# Patient Record
Sex: Male | Born: 1965
Health system: Southern US, Community
[De-identification: ages and names within clinical notes are randomized; demographics above are authoritative.]

## PROBLEM LIST (undated history)

## (undated) DIAGNOSIS — M1990 Unspecified osteoarthritis, unspecified site: Secondary | ICD-10-CM

## (undated) DIAGNOSIS — K219 Gastro-esophageal reflux disease without esophagitis: Secondary | ICD-10-CM

## (undated) DIAGNOSIS — I4891 Unspecified atrial fibrillation: Secondary | ICD-10-CM

## (undated) DIAGNOSIS — E119 Type 2 diabetes mellitus without complications: Secondary | ICD-10-CM

## (undated) DIAGNOSIS — G4733 Obstructive sleep apnea (adult) (pediatric): Secondary | ICD-10-CM

## (undated) DIAGNOSIS — D509 Iron deficiency anemia, unspecified: Secondary | ICD-10-CM

## (undated) DIAGNOSIS — J189 Pneumonia, unspecified organism: Secondary | ICD-10-CM

## (undated) DIAGNOSIS — R011 Cardiac murmur, unspecified: Secondary | ICD-10-CM

## (undated) HISTORY — DX: Obstructive sleep apnea (adult) (pediatric): G47.33

## (undated) HISTORY — DX: Cardiac murmur, unspecified: R01.1

## (undated) HISTORY — DX: Iron deficiency anemia, unspecified: D50.9

## (undated) HISTORY — DX: Unspecified osteoarthritis, unspecified site: M19.90

## (undated) HISTORY — PX: ANTERIOR FUSION CERVICAL SPINE: SUR626

## (undated) HISTORY — PX: TONSILLECTOMY: SUR1361

---

## 1998-10-31 ENCOUNTER — Emergency Department (HOSPITAL_COMMUNITY): Admission: EM | Admit: 1998-10-31 | Discharge: 1998-10-31 | Payer: Self-pay | Admitting: *Deleted

## 2001-07-28 ENCOUNTER — Emergency Department (HOSPITAL_COMMUNITY): Admission: EM | Admit: 2001-07-28 | Discharge: 2001-07-28 | Payer: Self-pay | Admitting: Emergency Medicine

## 2001-07-29 ENCOUNTER — Encounter: Payer: Self-pay | Admitting: *Deleted

## 2001-07-29 ENCOUNTER — Ambulatory Visit (HOSPITAL_COMMUNITY): Admission: RE | Admit: 2001-07-29 | Discharge: 2001-07-29 | Payer: Self-pay | Admitting: *Deleted

## 2001-08-22 ENCOUNTER — Encounter: Payer: Self-pay | Admitting: Orthopaedic Surgery

## 2001-08-22 ENCOUNTER — Encounter: Admission: RE | Admit: 2001-08-22 | Discharge: 2001-08-22 | Payer: Self-pay | Admitting: Orthopaedic Surgery

## 2001-08-23 ENCOUNTER — Encounter: Payer: Self-pay | Admitting: Orthopaedic Surgery

## 2001-08-28 ENCOUNTER — Inpatient Hospital Stay (HOSPITAL_COMMUNITY): Admission: RE | Admit: 2001-08-28 | Discharge: 2001-08-30 | Payer: Self-pay | Admitting: Orthopaedic Surgery

## 2001-08-28 ENCOUNTER — Encounter: Payer: Self-pay | Admitting: Orthopaedic Surgery

## 2001-08-29 ENCOUNTER — Encounter: Payer: Self-pay | Admitting: Orthopaedic Surgery

## 2004-11-19 ENCOUNTER — Emergency Department (HOSPITAL_COMMUNITY): Admission: EM | Admit: 2004-11-19 | Discharge: 2004-11-19 | Payer: Self-pay | Admitting: Emergency Medicine

## 2004-12-02 ENCOUNTER — Ambulatory Visit: Payer: Self-pay | Admitting: Orthopedic Surgery

## 2005-12-26 ENCOUNTER — Emergency Department (HOSPITAL_COMMUNITY): Admission: EM | Admit: 2005-12-26 | Discharge: 2005-12-26 | Payer: Self-pay | Admitting: Emergency Medicine

## 2006-06-27 DIAGNOSIS — J189 Pneumonia, unspecified organism: Secondary | ICD-10-CM

## 2006-06-27 HISTORY — DX: Pneumonia, unspecified organism: J18.9

## 2006-10-06 ENCOUNTER — Ambulatory Visit (HOSPITAL_BASED_OUTPATIENT_CLINIC_OR_DEPARTMENT_OTHER): Admission: RE | Admit: 2006-10-06 | Discharge: 2006-10-06 | Payer: Self-pay | Admitting: Urology

## 2006-10-06 ENCOUNTER — Encounter (INDEPENDENT_AMBULATORY_CARE_PROVIDER_SITE_OTHER): Payer: Self-pay | Admitting: Urology

## 2006-10-21 ENCOUNTER — Emergency Department (HOSPITAL_COMMUNITY): Admission: EM | Admit: 2006-10-21 | Discharge: 2006-10-22 | Payer: Self-pay | Admitting: Emergency Medicine

## 2006-10-22 ENCOUNTER — Emergency Department (HOSPITAL_COMMUNITY): Admission: EM | Admit: 2006-10-22 | Discharge: 2006-10-23 | Payer: Self-pay | Admitting: Emergency Medicine

## 2010-07-03 ENCOUNTER — Emergency Department (HOSPITAL_COMMUNITY): Payer: No Typology Code available for payment source

## 2010-07-03 ENCOUNTER — Inpatient Hospital Stay (HOSPITAL_COMMUNITY)
Admission: EM | Admit: 2010-07-03 | Discharge: 2010-07-05 | DRG: 184 | Disposition: A | Discharge status: Home / Self Care | Payer: No Typology Code available for payment source | Attending: General Surgery | Admitting: General Surgery

## 2010-07-03 DIAGNOSIS — Y92410 Unspecified street and highway as the place of occurrence of the external cause: Secondary | ICD-10-CM

## 2010-07-03 DIAGNOSIS — IMO0002 Reserved for concepts with insufficient information to code with codable children: Secondary | ICD-10-CM

## 2010-07-03 DIAGNOSIS — N400 Enlarged prostate without lower urinary tract symptoms: Secondary | ICD-10-CM | POA: Diagnosis present

## 2010-07-03 DIAGNOSIS — Z23 Encounter for immunization: Secondary | ICD-10-CM

## 2010-07-03 DIAGNOSIS — S2249XA Multiple fractures of ribs, unspecified side, initial encounter for closed fracture: Principal | ICD-10-CM | POA: Diagnosis present

## 2010-07-03 DIAGNOSIS — S300XXA Contusion of lower back and pelvis, initial encounter: Secondary | ICD-10-CM | POA: Diagnosis present

## 2010-07-03 DIAGNOSIS — Z981 Arthrodesis status: Secondary | ICD-10-CM

## 2010-07-03 DIAGNOSIS — S270XXA Traumatic pneumothorax, initial encounter: Secondary | ICD-10-CM | POA: Diagnosis present

## 2010-07-03 LAB — COMPREHENSIVE METABOLIC PANEL
ALT: 61 U/L — ABNORMAL HIGH (ref 0–53)
AST: 60 U/L — ABNORMAL HIGH (ref 0–37)
Albumin: 4 g/dL (ref 3.5–5.2)
Calcium: 9.4 mg/dL (ref 8.4–10.5)
GFR calc Af Amer: >60 mL/min (ref >60)
Sodium: 139 mEq/L (ref 135–145)
Total Protein: 7.9 g/dL (ref 6.0–8.3)

## 2010-07-03 LAB — TYPE AND SCREEN
ABO/RH(D): A POS
Antibody Screen: NEGATIVE

## 2010-07-03 LAB — POCT I-STAT, CHEM 8
BUN: 12 mg/dL (ref 6–23)
Potassium: 4.2 mEq/L (ref 3.5–5.1)
Sodium: 138 mEq/L (ref 135–145)
TCO2: 24 mmol/L (ref 0–100)

## 2010-07-03 LAB — DIFFERENTIAL
Basophils Absolute: 0 10*3/uL (ref 0.0–0.1)
Basophils Relative: 0 % (ref 0–1)
Lymphocytes Relative: 28 % (ref 12–46)
Monocytes Relative: 6 % (ref 3–12)
Neutro Abs: 6.2 10*3/uL (ref 1.7–7.7)
Neutrophils Relative %: 63 % (ref 43–77)

## 2010-07-03 LAB — CBC
Hemoglobin: 15.6 g/dL (ref 13.0–17.0)
MCH: 33.5 pg (ref 26.0–34.0)
RBC: 4.65 MIL/uL (ref 4.22–5.81)

## 2010-07-03 MED ORDER — IOHEXOL 300 MG/ML  SOLN
120.0000 mL | Freq: Once | INTRAMUSCULAR | Status: AC | PRN
  Administered 2010-07-03: 120 mL via INTRAVENOUS

## 2010-07-04 ENCOUNTER — Inpatient Hospital Stay (HOSPITAL_COMMUNITY): Payer: No Typology Code available for payment source

## 2010-07-04 LAB — BASIC METABOLIC PANEL
CO2: 26 mEq/L (ref 19–32)
Calcium: 8.9 mg/dL (ref 8.4–10.5)
Creatinine, Ser: 1.13 mg/dL (ref 0.4–1.5)
GFR calc Af Amer: >60 mL/min (ref >60)
GFR calc non Af Amer: >60 mL/min (ref >60)

## 2010-07-04 LAB — CBC
HCT: 40.9 % (ref 39.0–52.0)
Hemoglobin: 13.7 g/dL (ref 13.0–17.0)
MCH: 32.8 pg (ref 26.0–34.0)
MCV: 97.8 fL (ref 78.0–100.0)
RBC: 4.18 MIL/uL — ABNORMAL LOW (ref 4.22–5.81)

## 2010-07-05 ENCOUNTER — Inpatient Hospital Stay (HOSPITAL_COMMUNITY): Payer: No Typology Code available for payment source
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
## 2010-09-28 NOTE — Op Note (Signed)
NAME:  Stephen Roberts, Stephen Roberts              ACCOUNT NO.:  [id number]   MEDICAL RECORD NO.:  [id number]          PATIENT TYPE:  AMB   LOCATION:  NESC                         FACILITY:  [facility]   PHYSICIAN:  Sigmund I. Patsi Sears, M.D.DATE OF BIRTH:  18-May-1965   DATE OF PROCEDURE:  10/06/2006  DATE OF DISCHARGE:                               OPERATIVE REPORT   PREOPERATIVE DIAGNOSIS:  Chronic nonbacterial prostatitis and  interstitial cystitis.   POSTOPERATIVE DIAGNOSIS:  Chronic nonbacterial prostatitis and  interstitial cystitis.   OPERATION:  Cystourethroscopy, hydrodistention of bladder (900 mL),  biopsy of the bladder, cauterization of the biopsy sites, and insertion  of Pyridium and Marcaine into the bladder.   SURGEON:  Sigmund I. Patsi Sears, M.D.   ANESTHESIA:  General LMA.   PREPARATION:  After appropriate preanesthesia, the patient was brought  to the operating room and placed upon the operating table in the dorsal  supine position where general LMA anesthesia was introduced.  He was  then replaced in the dorsal lithotomy position where the pubis was  prepped with Betadine solution and draped in the usual fashion.   PROCEDURE:  Cystourethroscopy was accomplished and showed no evidence of  urethral stricture.  The prostate was 2+ and lobular and benign.  The  bladder neck was normal.  The bladder itself appeared normal, except for  a nodule located just to the left of the midline of the trigone.  Hydrodistention of the bladder was accomplished in 900 mL without  cracking of the bladder.  Photo documentation was accomplished.   Biopsy was taken of the nodular abnormality of the bladder, and the site  was cauterized.  Marcaine and Pyridium solution was inserted in the  bladder and left in the bladder.  The patient was given IV Toradol,  awakened, and taken to the recovery room in good condition.      Sigmund I. Patsi Sears, M.D.  Electronically Signed     SIT/MEDQ  D:   10/06/2006  T:  10/06/2006  Job:  [id number]

## 2010-10-01 NOTE — H&P (Signed)
[place]  Patient:    Stephen Roberts, Stephen Roberts Visit Number: [id number] MRN: [id number]          Service Type: SUR Location: 3000 3020 01 Attending Physician:  Patricia Nettle Dictated by:   Sammuel Cooper Mahar, P.A. Admit Date:  08/28/2001 Discharge Date: 08/30/2001                           History and Physical  DATE OF BIRTH:  1965-11-16  CHIEF COMPLAINT:  Neck and left upper extremity pain.  HISTORY OF PRESENT ILLNESS:  The patient is a 45 year old white male with a 5-6 week history of neck and arm pain.  He has no known injury.  It has been continuous and even getting worse.  It is interfering with his activities of daily living and quality of life.  He has failed conservative management.  The risks and benefits of the proposed surgery were discussed with the patient as well as the patients family by Dr. Sharolyn Douglas, as well as myself.  They indicated understanding and wished to proceed.  ALLERGIES:  No known drug allergies.  MEDICATIONS:  Percocet p.r.n. pain.  PAST MEDICAL HISTORY:  Healthy.  PAST SURGICAL HISTORY:  None.  SOCIAL HISTORY:  The patient smokes two packs of cigarettes per day.  Rare alcohol use.  He is married and has one child living at home.  He does have a history of drug abuse; however, he is recovered from this.  His wife will be available to help him through the postoperative course.  She is in excellent health.  FAMILY HISTORY:  Unknown.  REVIEW OF SYSTEMS:  The patient denies any fevers, chills, sweats, or bleeding tendencies.  CNS:  Denies blurred vision, double vision, seizure, headache, paralysis.  CARDIOVASCULAR:  No chest pain, angina, orthopnea, claudication, or palpitations.  PULMONARY:  Denies shortness of breath, productive cough, or hemoptysis.  GASTROINTESTINAL:  Denies nausea, vomiting, diarrhea, melena, or bloody stool.  Positive for constipation secondary to Percocet.  It is relieved with over the  counter medications.  GENITOURINARY:  Denies dysuria, hematuria, or discharge.  MUSCULOSKELETAL:  Denies any paresthesias or numbness.  Does have radiating pain into the left upper extremity.  No paralysis.  PHYSICAL EXAMINATION:  VITAL SIGNS:  Blood pressure is 128/84, respirations 16 and unlabored, pulse is 80 and regular, temperature not taken.  GENERAL:  The patient is a 45 year old white male who is alert and oriented in no acute distress, well-nourished, well-groomed.  He appears his stated age. He is very pleasant and cooperative to examination.  He presents with his wife and daughter.  HEENT:  Head is normocephalic, atraumatic.  Pupils equal, round, and reactive to light.  Extraocular movements intact.  Nares are patent.  Pharynx is clear.  NECK:  Soft to palpation.  No lymphadenopathy, thyromegaly, or bruits appreciated.  He has obvious posterior neck discomfort as well as guarding.  CHEST:  Positive wheezes throughout lung fields.  No rales, rhonchi, stridor, or rubs.  BREASTS:  Not pertinent, not performed.  HEART:  S1, S2, regular rate and rhythm, no murmurs, gallops, or rubs noted.  ABDOMEN:  Soft to palpation, nontender, nondistended, no organomegaly noted. Positive bowel sounds throughout.  GENITOURINARY:  Not pertinent, not performed.  EXTREMITIES:  Left upper extremity with radiating pain.  Radial pulses are intact and equal bilaterally.  NEUROLOGIC:  Distal motor function is intact.  Sensation is grossly intact to  light touch.  SKIN:  Intact without any lesions or rashes.  IMPRESSION:  Herniated nucleus pulposus at C5-6 and 6-7.  PLAN:  Admit to [facility] on August 28, 2001, for an anterior cervical diskectomy and fusion at levels C5-6 and 6-7.  It will be done by Dr. Sharolyn Douglas. Dictated by:   Sammuel Cooper. Mahar, P.A. Attending Physician:  Patricia Nettle. DD:  08/23/01 TD:  08/23/01 Job: [id number]

## 2010-10-01 NOTE — Op Note (Signed)
[place]. [facility]  Patient:    Stephen Roberts, Stephen Roberts Visit Number: [id number] MRN: [id number]          Service Type: SUR Location: RCRM 2550 11 Attending Physician:  Patricia Nettle Dictated by:   Patricia Nettle, M.D. Proc. Date: 08/28/01 Admit Date:  08/28/2001                             Operative Report  PREOPERATIVE DIAGNOSIS:  Herniated disk C5-6, C6-7 with left upper extremity radiculopathy.  POSTOPERATIVE DIAGNOSIS:  Herniated disk C5-6, C6-7 with left upper extremity radiculopathy.  OPERATION: 1. Anterior cervical diskectomy C5-6 and C6-7. 2. Anterior cervical fusion C5-6, C6-7. 3. Placement of Synthes 10 mm lordotic prosthesis spacer at C5-6 and    C6-7. 4. Left morcellized anterior iliac crest bone graft. 5. Anterior cervical plating with Synthes small stature plate, Y7-W2. 6. Placement and removal of Garner-Wells tongs. 7. Dural monitoring with SSEPs.  SURGEON:  Patricia Nettle, M.D.  ASSISTANT:  Kerrin Champagne, M.D.  ANESTHESIA:  General endotracheal.  COMPLICATIONS:  None.  INDICATIONS:  The patient is a 45 year old male with a history of severe incapacitating left upper extremity pain.  He was initially seen by Dr. Darrelyn Hillock who suspected a cervical radiculopathy and ordered an MRI scan. The patient was in such pain that he had to undergo general anesthesia in order to have the MRI done.  His MRI scan showed two large disk herniations at C5-6 and C6-7 on the left consistent with his symptomatology.  A CT myelogram was ordered again demonstrating the above mentioned pathology.  He was sent for a selective nerve root injection left C5-6 and C6-7 and if anything his symptoms worsened.  He tried gentle cervical traction as well as narcotic pain medication and felt as though he was getting weaker.  Neurologic exam shows weakness in the biceps, triceps and wrist extensors, 4/5.  After a long discussion with the patient regarding the risks and  benefits of anterior cervical diskectomy and fusion, left anterior iliac crest bone graft, he elected to proceed in hopes of improving his pain.  All of the risks of surgery were discussed preoperatively including bleeding, infection, nerve root and spinal cord injury, dural tear, CSF leak, paralysis, pseudoarthrosis, hardware failure and the need for additional surgery.  We also discussed approach related issues including injury to the esophagus, trachea and carotid artery and laryngeal nerve.  We discussed the postoperative course including bracing, return to work and normal activities.  DESCRIPTION OF PROCEDURE:  The patient was properly identified in the holding area and taken to the operating room.  He underwent general endotracheal anesthesia without difficulty.  Care was taken not to hyperextend his neck. At this point we established some minor sensory evoked potentials and took baseline studies.  We then carefully positioned him in slight hyperextension. The Garner-Wells tongs were placed in the usual standard fashion and 5 pounds of weight were then hung off of his head.  He was placed in reverse Trendelenburg.  The neck and left iliac crest were prepped and draped in the usual sterile fashion.  He was given prophylactic antibiotics.  A 4 cm incision was made just above the cricoid cartilage starting in the midline and extending to the midportion of the sternocleidomastoid.  This was carried down to the platsyma.  The platysma was incised sharply.  The sternocleidomastoid muscles was then skeletonized and the plane dissected between  the SCM and the strap muscles medially.  The bones of the anterior cervical spine were identified.  The esophagus was identified and protected at all times.  We then placed a spinal needle and took an x-ray confirming that we were at the C6-7 level.  While the x-ray was being developed I harvested the left anterior iliac crest bone graft.  A 1 cm  incision was made 2 cm posterior to the anterior superior iliac spine.  This was carried down to deep fascia.  The deep fascia was incised, and the iliac crest was exposed.  She was used a Teacher, early years/pre 10 mm bone harvester to collect 3 cc of cancellous bone.  The wound was copiously irrigated.  The deep layer was closed with #1 Vicryl followed by 3-0 Vicryl on the skin and then a running Monocryl.  Steri-Strips were placed. Attention was directed back to the neck.  We had identified the C6-C7 level and then we worked up from there.  The longus coli muscle was reflected bilaterally using the electrocautery.  Care was taken to protect the disk below and above.  We then placed our deep Shadowline retractor.  A subtotal diskectomy was then performed using pituitary rongeurs and curets.  We then placed the [facility] distractor at C6-7 and brought in the microscope to complete the procedure.  The posterior longitudinal ligament was identified.  We used the bur to remove the osteophytes.  There was a large subligamentous herniation on the left side which extended out into the foramen.  This was removed in one large piece after we took down the PLL in the foramen.  We identified the nerve root and confirmed that it was free and patent out the foramen using a blunt probe.  We did not take down the PLL on the right side. At this point, we prepared our end-plates using the high speed bur.  Then using the Synthes trial spacers, a 10 mm lordotic graft was chosen.  This was packed with the morcellized bone graft that had been collected from the iliac crest.  We then tamped the prosthesis into the intervertebral space.  He had excellent bony contact.  At this point, we carried out a similar procedure at C5-6.  Again, a herniation was identified subligamentous on the left not quite as large as the one we had removed from the C6-7 foramen.  We again used the Synthes trial spacers and placed the 10 mm graft at this  level.  The wound was copiously irrigated.  Penfield drain was placed deep.  We took and x-ray confirming the appropriate level and good position of the hardware.  We closed  the platsyma with interrupted 2-0 Vicryl.  The subcutaneous layer was closed with 3-0 Vicryl followed by a running Monocryl and Steri-Strips.  Towards the end of the procedure the SSEPs decreased approximately 50% on the right side. We could not identify any intraoperative cause of this and closed the wound. When the patient was extubated he had excellent strength in all four extremities.  A sterile dressing was applied to the wounds.  Garner-Wells tongs were removed.  An Aspen collar was placed.  He was transferred to the recovery room in stable condition. Dictated by:   Patricia Nettle, M.D. Attending Physician:  Patricia Nettle. DD:  08/28/01 TD:  08/28/01 Job: [id number]

## 2011-03-03 LAB — URINALYSIS, ROUTINE W REFLEX MICROSCOPIC
Nitrite: POSITIVE — AB
Nitrite: NEGATIVE
Protein, ur: >300 — AB
Protein, ur: 100 — AB
Urobilinogen, UA: 4 — ABNORMAL HIGH
pH: 6

## 2011-03-03 LAB — URINE CULTURE: Colony Count: NO GROWTH

## 2011-03-03 LAB — URINE MICROSCOPIC-ADD ON

## 2011-05-05 ENCOUNTER — Other Ambulatory Visit: Payer: Self-pay | Admitting: Orthopaedic Surgery

## 2011-05-05 DIAGNOSIS — M545 Low back pain: Secondary | ICD-10-CM

## 2011-05-05 DIAGNOSIS — M542 Cervicalgia: Secondary | ICD-10-CM

## 2012-03-22 ENCOUNTER — Other Ambulatory Visit: Payer: Self-pay | Admitting: Family Medicine

## 2012-03-22 ENCOUNTER — Ambulatory Visit
Admission: RE | Admit: 2012-03-22 | Discharge: 2012-03-22 | Disposition: A | Discharge status: Home / Self Care | Payer: Worker's Compensation | Source: Ambulatory Visit | Attending: Family Medicine | Admitting: Family Medicine

## 2012-03-22 DIAGNOSIS — B999 Unspecified infectious disease: Secondary | ICD-10-CM

## 2013-01-11 ENCOUNTER — Ambulatory Visit (INDEPENDENT_AMBULATORY_CARE_PROVIDER_SITE_OTHER): Payer: Self-pay | Admitting: General Surgery

## 2013-01-11 ENCOUNTER — Encounter (INDEPENDENT_AMBULATORY_CARE_PROVIDER_SITE_OTHER): Payer: Self-pay | Admitting: General Surgery

## 2013-01-11 ENCOUNTER — Ambulatory Visit (INDEPENDENT_AMBULATORY_CARE_PROVIDER_SITE_OTHER): Payer: Worker's Compensation | Admitting: General Surgery

## 2013-01-11 VITALS — BP 124/72 | HR 64 | Resp 16 | Ht 69.0 in | Wt 224.2 lb

## 2013-01-11 DIAGNOSIS — K436 Other and unspecified ventral hernia with obstruction, without gangrene: Secondary | ICD-10-CM

## 2013-01-11 DIAGNOSIS — E669 Obesity, unspecified: Secondary | ICD-10-CM | POA: Insufficient documentation

## 2013-01-11 DIAGNOSIS — Z720 Tobacco use: Secondary | ICD-10-CM | POA: Insufficient documentation

## 2013-01-11 DIAGNOSIS — F17200 Nicotine dependence, unspecified, uncomplicated: Secondary | ICD-10-CM | POA: Insufficient documentation

## 2013-01-11 DIAGNOSIS — K420 Umbilical hernia with obstruction, without gangrene: Secondary | ICD-10-CM

## 2013-01-11 NOTE — Progress Notes (Signed)
Patient ID: Stephen Roberts, male   DOB: [birth year], 47 y.o.   MRN: [id number]  Chief Complaint  Patient presents with  . New Evaluation    eval abd mass/ abd hernia    HPI Stephen Roberts is a 47 y.o. male.  He is referred by Dr. Milinda Antis at [facility] family [facility] for evaluation of painful abdominal wall hernias.  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 He lifts heavy objects all day long. He has no prior history of hernia. About one month ago while lifting a steel object, he felt some pain just above his umbilicus in the midline. He has had pain ever since. He says he has a bulge above the umbilicus that can be almost as large as orange and will sometimes go down it might. No gastrointestinal symptoms. He had a CT scan at [facility] on [facility] We have the disc and I have looked at the CT scan myself. It shows fascial defect at the umbilicus traversed by fat and soft tissue. No bowel herniation is noted. Nonobstructing left upper pole renal calculus. Otherwise normal study.  He is here with his wife today. He is in no distress. He says he's lost 2 or 3 days work already because it is too painful for him to do his job.  He is somewhat overweight with BMI 33. He is a smoker. No other significant medical problems. HPI  Past Medical History  Diagnosis Date  . Arthritis   . Heart murmur     Past Surgical History  Procedure Laterality Date  . Anterior fusion cervical spine      History reviewed. No pertinent family history.  Social History History  Substance Use Topics  . Smoking status: Current Every Day Smoker -- 0.50 packs/day  . Smokeless tobacco: Never Used  . Alcohol Use: Yes    No Known Allergies  Current Outpatient Prescriptions  Medication Sig Dispense Refill  . HYDROcodone-acetaminophen (NORCO/VICODIN) 5-325 MG per tablet Take 1 tablet by mouth every 6 (six) hours as needed for pain.      Marland Kitchen ibuprofen (ADVIL,MOTRIN) 600 MG tablet Take 600 mg by mouth  every 6 (six) hours as needed for pain.       No current facility-administered medications for this visit.    Review of Systems Review of Systems  Constitutional: Negative for fever, chills and unexpected weight change.  HENT: Negative for hearing loss, congestion, sore throat, trouble swallowing and voice change.   Eyes: Negative for visual disturbance.  Respiratory: Negative for cough and wheezing.   Cardiovascular: Negative for chest pain, palpitations and leg swelling.  Gastrointestinal: Positive for abdominal pain. Negative for nausea, vomiting, diarrhea, constipation, blood in stool, abdominal distention, anal bleeding and rectal pain.  Genitourinary: Negative for hematuria and difficulty urinating.  Musculoskeletal: Negative for arthralgias.  Skin: Negative for rash and wound.  Neurological: Negative for seizures, syncope, weakness and headaches.  Hematological: Negative for adenopathy. Does not bruise/bleed easily.  Psychiatric/Behavioral: Negative for confusion.    Blood pressure 124/72, pulse 64, resp. rate 16, height 5\' 9"  (1.753 m), weight 224 lb 3.2 oz (101.696 kg).  Physical Exam Physical Exam  Constitutional: He is oriented to person, place, and time. He appears well-developed and well-nourished. No distress.  HENT:  Head: Normocephalic.  Nose: Nose normal.  Mouth/Throat: No oropharyngeal exudate.  Eyes: Conjunctivae and EOM are normal. Pupils are equal, round, and reactive to light. Right eye exhibits no discharge. Left eye exhibits no discharge. No scleral  icterus.  Neck: Normal range of motion. Neck supple. No JVD present. No tracheal deviation present. No thyromegaly present.  Cardiovascular: Normal rate, regular rhythm, normal heart sounds and intact distal pulses.   No murmur heard. Pulmonary/Chest: Effort normal and breath sounds normal. No stridor. No respiratory distress. He has no wheezes. He has no rales. He exhibits no tenderness.  Abdominal: Soft. Bowel  sounds are normal. He exhibits no distension and no mass. There is no tenderness. There is no rebound and no guarding.    He has a partially incarcerated umbilical hernia with a hernia bulge presenting in the right upper rim of the umbilicus. He has a larger epigastric hernia in the midline above the umbilicus. This is only partially reducible. The skin is healthy. The rest of the abdominal exam is unremarkable.  Genitourinary:  Examined standing. There is no inguinal pain or tenderness. There is no inguinal adenopathy or hernia. Penis scrotum and testes are normal  Musculoskeletal: Normal range of motion. He exhibits no edema and no tenderness.  Lymphadenopathy:    He has no cervical adenopathy.  Neurological: He is alert and oriented to person, place, and time. He has normal reflexes. Coordination normal.  Skin: Skin is warm and dry. No rash noted. He is not diaphoretic. No erythema. No pallor.  Psychiatric: He has a normal mood and affect. His behavior is normal. Judgment and thought content normal.    Data Reviewed CT scan and report  Assessment    Incarcerated umbilical hernia and incarcerated epigastric hernia. These are tender and symptomatic and interfering with work. This is elected, because he is so symptomatic both he and I have agreed to go ahead and schedule surgery in the near future.  Tobacco abuse. I told him to cut back to 4 cigarettes a day and we will go ahead with the surgery in the near future. The told him I want him to stop smoking altogether postop  Obesity, BMI 33     Plan    We'll schedule for laparoscopic repair of his multiple ventral hernias with mesh, possible open repair in the near future  I discussed the indications, details, technique, and numerous risk of the surgery. We talked about hernia recurrence and that his recurrence rates are increased because of obesity and smoking. We talked about infection and I told him this infection risk is increased  because of smoking. Risk of bleeding. Risk of injury to adjacent organs and other unforeseen problems. He understands all these issues. All his questions are answered. He agrees with this plan.        Angelia Mould. Derrell Lolling, M.D., [facility] General and Minimally invasive Surgery Breast and Colorectal Surgery Office:   [phone] Pager:   [phone]  01/11/2013, 3:13 PM

## 2013-01-11 NOTE — Patient Instructions (Addendum)
You have 2 hernias, one at the bellybutton which is called an umbilical hernia. The second hernia, the larger and more painful hernia above that is called an epigastric hernia. Both of these are incarcerated, meaning I can only partially push the fatty tissue back in. That is why they are painful.  You will be scheduled for laparoscopic repair of your multiple ventral hernias with mesh, possible open.        Laparoscopic Ventral Hernia Repair Laparoscopic ventral hernia repairis a surgery to fix a ventral hernia. Aventral hernia, also called an incisional hernia, is a bulge of body tissue or intestines that pushes through the front part of the abdomen. This can happen if the connective tissue covering the muscles over the abdomen has a weak spot or is torn because of a surgical cut (incision) from a previous surgery. Laparoscopic ventral hernia repair is often done soon after diagnosis to stop the hernia from getting bigger, becoming uncomfortable, or becoming an emergency. This surgery usually takes about 2 hours, but the time can vary greatly. LET YOUR CAREGIVER KNOW ABOUT:  Any allergies you have.  All medicines you are taking, including steroids, vitamins, herbs, eyedrops, and over-the-counter medicines and creams.  Previous problems you or members of your family have had with the use of anesthetics.  Any blood disorders or bleeding problems you have had.  Past surgeries you have had.  Other health problems you have. RISKS AND COMPLICATIONS  Generally, laparoscopic ventral hernia repair is a safe procedure. However, as with any surgical procedure, complications can occur. Possible complications include:  Bleeding.  Trouble passing urine or having a bowel movement after the operation.  Infection.  Pneumonia.  Blood clots.  Pain in the area of the hernia.  A bulge in the area of the hernia that may be caused by a collection of fluid.  Injury to intestines or other  structures in the abdomen.  Return of the hernia after surgery. In some cases, the caregiver may need to stop the laparoscopic procedure and do regular, open surgery. This may be necessary for very difficult hernias, when organs are hard to see, or when bleeding problems occur during surgery. BEFORE THE PROCEDURE   You may need to have blood tests, urine tests, a chest X-ray, or electrocardiography done before the day of the surgery.  Ask your caregiver about changing or stopping your regular medicines.  You may need to wash with a special type of germ-killing soap.  Do not eat or drink anything for at least 6 hours before the surgery.  Make plans to have someone drive you home after the procedure. PROCEDURE   Small monitors will be put on your body. They are used to check your heart, blood pressure, and oxygen level.  An intravenous (IV) access tube will be put into a vein in your hand or arm. Fluids and medicine will flow directly into your body through the IV tube.  You will be given medicine to make you sleep through the procedure (general anesthetic).  Your abdomen will be cleaned with a special soap to kill any germs on your skin.  Once you are asleep, several small incisions will be made in your abdomen.  The large space in your abdomen will be filled with air so that it expands. This gives the caregiver more room and a better view.  A thin, lighted tube with a tiny camera on the end (laparoscope) is put through a small incision in your abdomen. The camera on  the laparoscope sends a picture to a TV screen in the operating room. This gives the caregiver a good view inside the abdomen.  Hollow tubes are put through the other small incisions in your abdomen. The tools needed for the procedure are put through these tubes.  The caregiver puts the tissue or intestines that formed the hernia back in place.  A screen-like patch (mesh) is used to close the hernia. This helps make the  area stronger. Stitches, tacks, or staples are used to keep the mesh in place.  Medicine and a bandage (dressing) or skin glue will be put over the incisions. AFTER THE PROCEDURE   You will stay in a recovery area until the anesthetic wears off. Your blood pressure and pulse will be checked often.  You may be able to go home the same day or may need to stay in the hospital for 1 or 2 days after this surgery. Your caregiver will decide when you can go home.  You may feel some pain. You will likely be given medicine for pain.  You will be urged to do breathing exercises that involve taking deep breaths. This helps prevent a lung infection after a surgery.  You may have to wear compression stockings while you are in the hospital. These stockings help keep blood clots from forming in your legs. Document Released: 04/18/2012 Document Reviewed: 02/22/2012 [facility] Patient Information 2014 [place], [place].

## 2013-01-21 ENCOUNTER — Encounter (HOSPITAL_COMMUNITY): Payer: Self-pay | Admitting: Pharmacy Technician

## 2013-01-21 NOTE — H&P (Signed)
AIZIK RE[facility]   MRN:  [id number]   Description: 47 year old male  Provider: Ernestene Mention, MD  Department: [facility]        Diagnoses    Incarcerated epigastric hernia    -  Primary    552.29    Incarcerated umbilical hernia        552.1    Obesity, unspecified        278.00    Tobacco use disorder        305.1      Reason for Visit    New Evaluation    eval abd mass/ abd hernia        Current Vitals - Last Recorded    BP Pulse Resp Ht Wt BMI    124/72 64 16 5\' 9"  (1.753 m) 224 lb 3.2 oz (101.696 kg) 33.09 kg/m2       History and Physical    Ernestene Mention, MD     Status: Signed                       HPI EDRICK WHITEHORN is a 47 y.o. male.  He is referred by Dr. Milinda Antis at [facility] for evaluation of painful abdominal wall hernias.   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 He lifts heavy objects all day long. He has no prior history of hernia. About one month ago while lifting a steel object, he felt some pain just above his umbilicus in the midline. He has had pain ever since. He says he has a bulge above the umbilicus that can be almost as large as orange and will sometimes go down it might. No gastrointestinal symptoms. He had a CT scan at [facility] on [facility].. We have the disc and I have looked at the CT scan myself. It shows fascial defect at the umbilicus traversed by fat and soft tissue. No bowel herniation is noted. Nonobstructing left upper pole renal calculus. Otherwise normal study.   He is here with his wife today. He is in no distress. He says he's lost 2 or 3 days work already because it is too painful for him to do his job.   He is somewhat overweight with BMI 33. He is a smoker. No other significant medical problems.       Past Medical History   Diagnosis  Date   .  Arthritis     .  Heart murmur           Past Surgical History   Procedure  Laterality  Date   .  Anterior fusion  cervical spine            History reviewed. No pertinent family history.   Social History History   Substance Use Topics   .  Smoking status:  Current Every Day Smoker -- 0.50 packs/day   .  Smokeless tobacco:  Never Used   .  Alcohol Use:  Yes        No Known Allergies    Current Outpatient Prescriptions   Medication  Sig  Dispense  Refill   .  HYDROcodone-acetaminophen (NORCO/VICODIN) 5-325 MG per tablet  Take 1 tablet by mouth every 6 (six) hours as needed for pain.         Marland Kitchen  ibuprofen (ADVIL,MOTRIN) 600 MG tablet  Take 600 mg by mouth every 6 (six) hours as needed for pain.  Review of Systems   Constitutional: Negative for fever, chills and unexpected weight change.  HENT: Negative for hearing loss, congestion, sore throat, trouble swallowing and voice change.   Eyes: Negative for visual disturbance.  Respiratory: Negative for cough and wheezing.   Cardiovascular: Negative for chest pain, palpitations and leg swelling.  Gastrointestinal: Positive for abdominal pain. Negative for nausea, vomiting, diarrhea, constipation, blood in stool, abdominal distention, anal bleeding and rectal pain.  Genitourinary: Negative for hematuria and difficulty urinating.  Musculoskeletal: Negative for arthralgias.  Skin: Negative for rash and wound.  Neurological: Negative for seizures, syncope, weakness and headaches.  Hematological: Negative for adenopathy. Does not bruise/bleed easily.  Psychiatric/Behavioral: Negative for confusion.      Blood pressure 124/72, pulse 64, resp. rate 16, height 5\' 9"  (1.753 m), weight 224 lb 3.2 oz (101.696 kg).   Physical Exam   Constitutional: He is oriented to person, place, and time. He appears well-developed and well-nourished. No distress.  HENT:   Head: Normocephalic.   Nose: Nose normal.   Mouth/Throat: No oropharyngeal exudate.  Eyes: Conjunctivae and EOM are normal. Pupils are equal, round, and reactive to  light. Right eye exhibits no discharge. Left eye exhibits no discharge. No scleral icterus.  Neck: Normal range of motion. Neck supple. No JVD present. No tracheal deviation present. No thyromegaly present.  Cardiovascular: Normal rate, regular rhythm, normal heart sounds and intact distal pulses.    No murmur heard. Pulmonary/Chest: Effort normal and breath sounds normal. No stridor. No respiratory distress. He has no wheezes. He has no rales. He exhibits no tenderness.  Abdominal: Soft. Bowel sounds are normal. He exhibits no distension. There is no tenderness. There is no rebound and no guarding.   He has a partially incarcerated umbilical hernia with a hernia bulge presenting in the right upper rim of the umbilicus. He has a larger epigastric hernia in the midline above the umbilicus. This is only partially reducible. The skin is healthy. The rest of the abdominal exam is unremarkable.  Genitourinary:  Examined standing. There is no inguinal pain or tenderness. There is no inguinal adenopathy or hernia. Penis scrotum and testes are normal  Musculoskeletal: Normal range of motion. He exhibits no edema and no tenderness.  Lymphadenopathy:    He has no cervical adenopathy.  Neurological: He is alert and oriented to person, place, and time. He has normal reflexes. Coordination normal.  Skin: Skin is warm and dry. No rash noted. He is not diaphoretic. No erythema. No pallor.  Psychiatric: He has a normal mood and affect. His behavior is normal. Judgment and thought content normal.      Data Reviewed CT scan and report   Assessment    Incarcerated umbilical hernia and incarcerated epigastric hernia. These are tender and symptomatic and interfering with work. This is elected, because he is so symptomatic both he and I have agreed to go ahead and schedule surgery in the near future.   Tobacco abuse. I told him to cut back to 4 cigarettes a day and we will go ahead with the surgery in the  near future. The told him I want him to stop smoking altogether postop   Obesity, BMI 33      Plan    We'll schedule for laparoscopic repair of his multiple ventral hernias with mesh, possible open repair in the near future   I discussed the indications, details, technique, and numerous risk of the surgery. We talked about hernia recurrence and that his recurrence rates  are increased because of obesity and smoking. We talked about infection and I told him this infection risk is increased because of smoking. Risk of bleeding. Risk of injury to adjacent organs and other unforeseen problems. He understands all these issues. All his questions are answered. He agrees with this plan.           Angelia Mould. Derrell Lolling, M.D., [facility] General and Minimally invasive Surgery Breast and Colorectal Surgery Office:   [phone] Pager:   [phone]

## 2013-01-22 ENCOUNTER — Ambulatory Visit (HOSPITAL_COMMUNITY)
Admission: RE | Admit: 2013-01-22 | Discharge: 2013-01-22 | Disposition: A | Discharge status: Home / Self Care | Payer: Worker's Compensation | Source: Ambulatory Visit | Attending: General Surgery | Admitting: General Surgery

## 2013-01-22 ENCOUNTER — Encounter (HOSPITAL_COMMUNITY): Payer: Self-pay

## 2013-01-22 ENCOUNTER — Encounter (HOSPITAL_COMMUNITY)
Admission: RE | Admit: 2013-01-22 | Discharge: 2013-01-22 | Disposition: A | Discharge status: Home / Self Care | Payer: Worker's Compensation | Source: Ambulatory Visit | Attending: General Surgery | Admitting: General Surgery

## 2013-01-22 DIAGNOSIS — Z01810 Encounter for preprocedural cardiovascular examination: Secondary | ICD-10-CM | POA: Insufficient documentation

## 2013-01-22 DIAGNOSIS — K439 Ventral hernia without obstruction or gangrene: Secondary | ICD-10-CM | POA: Insufficient documentation

## 2013-01-22 DIAGNOSIS — Z01818 Encounter for other preprocedural examination: Secondary | ICD-10-CM | POA: Insufficient documentation

## 2013-01-22 HISTORY — DX: Pneumonia, unspecified organism: J18.9

## 2013-01-22 HISTORY — DX: Gastro-esophageal reflux disease without esophagitis: K21.9

## 2013-01-22 LAB — COMPREHENSIVE METABOLIC PANEL
Albumin: 3.7 g/dL (ref 3.5–5.2)
BUN: 12 mg/dL (ref 6–23)
Creatinine, Ser: 0.77 mg/dL (ref 0.50–1.35)
Total Bilirubin: 0.2 mg/dL — ABNORMAL LOW (ref 0.3–1.2)
Total Protein: 7.3 g/dL (ref 6.0–8.3)

## 2013-01-22 LAB — CBC WITH DIFFERENTIAL/PLATELET
Basophils Relative: 1 % (ref 0–1)
Eosinophils Absolute: 0.2 10*3/uL (ref 0.0–0.7)
HCT: 43.5 % (ref 39.0–52.0)
Hemoglobin: 14.8 g/dL (ref 13.0–17.0)
MCH: 32.8 pg (ref 26.0–34.0)
MCHC: 34 g/dL (ref 30.0–36.0)
Monocytes Absolute: 0.7 10*3/uL (ref 0.1–1.0)
Monocytes Relative: 9 % (ref 3–12)

## 2013-01-22 NOTE — Progress Notes (Signed)
01/22/13 1405  OBSTRUCTIVE SLEEP APNEA  Have you ever been diagnosed with sleep apnea through a sleep study? No  Do you snore loudly (loud enough to be heard through closed doors)?  1  Do you often feel tired, fatigued, or sleepy during the daytime? 1  Has anyone observed you stop breathing during your sleep? 1  Do you have, or are you being treated for high blood pressure? 0  BMI more than 35 kg/m2? 0  Age over 49 years old? 0  Neck circumference greater than 40 cm/18 inches? 0  Gender: 1  Obstructive Sleep Apnea Score 4  Score 4 or greater  Results sent to PCP

## 2013-01-22 NOTE — Patient Instructions (Signed)
20      Your procedure is scheduled on:  Wednesday 01/23/2013  Report to [facility] at  1020 AM.  Call this number if you have problems the night before or morning of surgery: [phone]   Remember:             IF YOU USE CPAP,BRING MASK AND TUBING AM OF SURGERY!   Do not eat food or drink liquids AFTER MIDNIGHT!  Take these medicines the morning of surgery with A SIP OF WATER: NONE   Do not bring valuables to the hospital. [facility] IS NOT RESPONSIBLE FOR ANY BELONGINGS OR VALUABLES BROUGHT TO HOSPITAL.  Marland Kitchen  Leave suitcase in the car. After surgery it may be brought to your room.  For patients admitted to the hospital, checkout time is 11:00 AM the day of              Discharge.    DO NOT WEAR JEWELRY , MAKE-UP, LOTIONS,POWDERS,PERFUMES!             WOMEN -DO NOT SHAVE LEGS OR UNDERARMS 12 HRS. BEFORE SURGERY!               MEN MAY SHAVE AS USUAL!             CONTACTS,DENTURES OR BRIDGEWORK, FALSE EYELASHES MAY NOT BE WORN INTO SURGERY!                                           Patients discharged the day of surgery will not be allowed to drive home. If going home the same day of surgery, must have someone stay with you first 24 hrs.at home and arrange for someone to drive you home from the Hospital.                         YOUR DRIVER RU:[id number]   Special Instructions:             Please read over the following fact sheets that you were given:             1. [place] PREPARING FOR SURGERY SHEET              2.INC[facility]IVE SPIROMETRY                                        [place].Shawana Knoch,RN,BSN     [phone]                FAILURE TO FOLLOW THESE INSTRUCTIONS MAY RESULT IN CANCELLATION OF YOUR SURGERY!               Patient Signature:___________________________

## 2013-01-23 ENCOUNTER — Encounter (HOSPITAL_COMMUNITY)
Admission: RE | Disposition: A | Discharge status: Home / Self Care | Payer: Self-pay | Source: Ambulatory Visit | Attending: General Surgery

## 2013-01-23 ENCOUNTER — Observation Stay (HOSPITAL_COMMUNITY)
Admission: RE | Admit: 2013-01-23 | Discharge: 2013-01-24 | Disposition: A | Discharge status: Home / Self Care | Payer: Worker's Compensation | Source: Ambulatory Visit | Attending: General Surgery | Admitting: General Surgery

## 2013-01-23 ENCOUNTER — Encounter (HOSPITAL_COMMUNITY): Payer: Self-pay | Admitting: Anesthesiology

## 2013-01-23 ENCOUNTER — Encounter (HOSPITAL_COMMUNITY): Payer: Self-pay | Admitting: *Deleted

## 2013-01-23 ENCOUNTER — Ambulatory Visit (HOSPITAL_COMMUNITY): Payer: Worker's Compensation | Admitting: Anesthesiology

## 2013-01-23 DIAGNOSIS — K436 Other and unspecified ventral hernia with obstruction, without gangrene: Principal | ICD-10-CM | POA: Insufficient documentation

## 2013-01-23 DIAGNOSIS — K420 Umbilical hernia with obstruction, without gangrene: Secondary | ICD-10-CM

## 2013-01-23 DIAGNOSIS — N200 Calculus of kidney: Secondary | ICD-10-CM | POA: Insufficient documentation

## 2013-01-23 DIAGNOSIS — Z6833 Body mass index (BMI) 33.0-33.9, adult: Secondary | ICD-10-CM | POA: Insufficient documentation

## 2013-01-23 DIAGNOSIS — K439 Ventral hernia without obstruction or gangrene: Secondary | ICD-10-CM

## 2013-01-23 DIAGNOSIS — F17200 Nicotine dependence, unspecified, uncomplicated: Secondary | ICD-10-CM | POA: Insufficient documentation

## 2013-01-23 DIAGNOSIS — E669 Obesity, unspecified: Secondary | ICD-10-CM | POA: Insufficient documentation

## 2013-01-23 HISTORY — PX: VENTRAL HERNIA REPAIR: SHX424

## 2013-01-23 HISTORY — PX: INSERTION OF MESH: SHX5868

## 2013-01-23 LAB — CBC
HCT: 44.7 % (ref 39.0–52.0)
Hemoglobin: 15.3 g/dL (ref 13.0–17.0)
MCHC: 34.2 g/dL (ref 30.0–36.0)
RBC: 4.59 MIL/uL (ref 4.22–5.81)

## 2013-01-23 LAB — CREATININE, SERUM
Creatinine, Ser: 0.9 mg/dL (ref 0.50–1.35)
GFR calc non Af Amer: >90 mL/min (ref >90)

## 2013-01-23 SURGERY — REPAIR, HERNIA, VENTRAL, LAPAROSCOPIC
Anesthesia: General | Site: Abdomen | Wound class: Clean

## 2013-01-23 MED ORDER — GLYCOPYRROLATE 0.2 MG/ML IJ SOLN
INTRAMUSCULAR | Status: DC | PRN
  Administered 2013-01-23: .8 mg via INTRAVENOUS

## 2013-01-23 MED ORDER — LACTATED RINGERS IV SOLN: INTRAVENOUS | Status: DC

## 2013-01-23 MED ORDER — ONDANSETRON HCL 4 MG/2ML IJ SOLN
INTRAMUSCULAR | Status: DC | PRN
  Administered 2013-01-23: 4 mg via INTRAVENOUS

## 2013-01-23 MED ORDER — BUPIVACAINE-EPINEPHRINE 0.5% -1:200000 IJ SOLN
INTRAMUSCULAR | Status: DC | PRN
  Administered 2013-01-23: 15 mL

## 2013-01-23 MED ORDER — ENOXAPARIN SODIUM 40 MG/0.4ML ~~LOC~~ SOLN
40.0000 mg | Freq: Once | SUBCUTANEOUS | Status: AC
  Administered 2013-01-23: 40 mg via SUBCUTANEOUS
  Filled 2013-01-23: qty 0.4

## 2013-01-23 MED ORDER — ONDANSETRON HCL 4 MG/2ML IJ SOLN: 4.0000 mg | Freq: Four times a day (QID) | INTRAMUSCULAR | Status: DC | PRN

## 2013-01-23 MED ORDER — NEOSTIGMINE METHYLSULFATE 1 MG/ML IJ SOLN
INTRAMUSCULAR | Status: DC | PRN
  Administered 2013-01-23: 5 mg via INTRAVENOUS

## 2013-01-23 MED ORDER — HYDROMORPHONE HCL PF 1 MG/ML IJ SOLN
INTRAMUSCULAR | Status: AC
  Filled 2013-01-23: qty 1

## 2013-01-23 MED ORDER — ENOXAPARIN SODIUM 40 MG/0.4ML ~~LOC~~ SOLN
40.0000 mg | SUBCUTANEOUS | Status: DC
  Administered 2013-01-24: 40 mg via SUBCUTANEOUS
  Filled 2013-01-23 (×2): qty 0.4

## 2013-01-23 MED ORDER — DEXAMETHASONE SODIUM PHOSPHATE 10 MG/ML IJ SOLN
INTRAMUSCULAR | Status: DC | PRN
  Administered 2013-01-23: 10 mg via INTRAVENOUS

## 2013-01-23 MED ORDER — BUPIVACAINE-EPINEPHRINE 0.5% -1:200000 IJ SOLN
INTRAMUSCULAR | Status: AC
  Filled 2013-01-23: qty 1

## 2013-01-23 MED ORDER — 0.9 % SODIUM CHLORIDE (POUR BTL) OPTIME
TOPICAL | Status: DC | PRN
  Administered 2013-01-23: 1000 mL

## 2013-01-23 MED ORDER — ONDANSETRON HCL 4 MG PO TABS: 4.0000 mg | ORAL_TABLET | Freq: Four times a day (QID) | ORAL | Status: DC | PRN

## 2013-01-23 MED ORDER — LACTATED RINGERS IV SOLN
INTRAVENOUS | Status: DC
  Administered 2013-01-23: 14:00:00 via INTRAVENOUS
  Administered 2013-01-23: 1000 mL via INTRAVENOUS

## 2013-01-23 MED ORDER — HYDROMORPHONE HCL PF 1 MG/ML IJ SOLN
INTRAMUSCULAR | Status: AC
Start: 2013-01-23 — End: 2013-01-23
  Filled 2013-01-23: qty 1

## 2013-01-23 MED ORDER — CEFAZOLIN SODIUM-DEXTROSE 2-3 GM-% IV SOLR
2.0000 g | Freq: Three times a day (TID) | INTRAVENOUS | Status: DC
  Administered 2013-01-23 – 2013-01-24 (×2): 2 g via INTRAVENOUS
  Filled 2013-01-23 (×4): qty 50

## 2013-01-23 MED ORDER — HYDROMORPHONE HCL PF 1 MG/ML IJ SOLN
1.0000 mg | INTRAMUSCULAR | Status: DC | PRN
  Administered 2013-01-23: 1 mg via INTRAVENOUS
  Filled 2013-01-23: qty 1

## 2013-01-23 MED ORDER — SUCCINYLCHOLINE CHLORIDE 20 MG/ML IJ SOLN
INTRAMUSCULAR | Status: DC | PRN
  Administered 2013-01-23: 100 mg via INTRAVENOUS

## 2013-01-23 MED ORDER — CEFAZOLIN SODIUM-DEXTROSE 2-3 GM-% IV SOLR
INTRAVENOUS | Status: AC
  Filled 2013-01-23: qty 50

## 2013-01-23 MED ORDER — CHLORHEXIDINE GLUCONATE 4 % EX LIQD
1.0000 "application " | Freq: Once | CUTANEOUS | Status: DC
  Filled 2013-01-23: qty 15

## 2013-01-23 MED ORDER — CISATRACURIUM BESYLATE (PF) 10 MG/5ML IV SOLN
INTRAVENOUS | Status: DC | PRN
  Administered 2013-01-23: 10 mg via INTRAVENOUS
  Administered 2013-01-23: 2 mg via INTRAVENOUS

## 2013-01-23 MED ORDER — PROPOFOL 10 MG/ML IV BOLUS
INTRAVENOUS | Status: DC | PRN
  Administered 2013-01-23: 200 mg via INTRAVENOUS

## 2013-01-23 MED ORDER — HYDROMORPHONE HCL PF 1 MG/ML IJ SOLN
INTRAMUSCULAR | Status: DC | PRN
  Administered 2013-01-23 (×2): 1 mg via INTRAVENOUS

## 2013-01-23 MED ORDER — CEFAZOLIN SODIUM-DEXTROSE 2-3 GM-% IV SOLR
2.0000 g | INTRAVENOUS | Status: AC
  Administered 2013-01-23: 2 g via INTRAVENOUS

## 2013-01-23 MED ORDER — HYDROMORPHONE HCL PF 1 MG/ML IJ SOLN
0.2500 mg | INTRAMUSCULAR | Status: DC | PRN
  Administered 2013-01-23 (×4): 0.5 mg via INTRAVENOUS

## 2013-01-23 MED ORDER — OXYCODONE-ACETAMINOPHEN 5-325 MG PO TABS
1.0000 | ORAL_TABLET | ORAL | Status: DC | PRN
  Administered 2013-01-23 – 2013-01-24 (×4): 2 via ORAL
  Filled 2013-01-23 (×4): qty 2

## 2013-01-23 MED ORDER — SUFENTANIL CITRATE 50 MCG/ML IV SOLN
INTRAVENOUS | Status: DC | PRN
  Administered 2013-01-23 (×2): 10 ug via INTRAVENOUS
  Administered 2013-01-23: 20 ug via INTRAVENOUS
  Administered 2013-01-23: 10 ug via INTRAVENOUS

## 2013-01-23 MED ORDER — LIDOCAINE HCL (CARDIAC) 20 MG/ML IV SOLN
INTRAVENOUS | Status: DC | PRN
  Administered 2013-01-23: 100 mg via INTRAVENOUS

## 2013-01-23 MED ORDER — MIDAZOLAM HCL 5 MG/5ML IJ SOLN
INTRAMUSCULAR | Status: DC | PRN
  Administered 2013-01-23: 2 mg via INTRAVENOUS

## 2013-01-23 MED ORDER — POTASSIUM CHLORIDE IN NACL 20-0.9 MEQ/L-% IV SOLN
INTRAVENOUS | Status: DC
  Administered 2013-01-23: 20:00:00 via INTRAVENOUS
  Filled 2013-01-23 (×3): qty 1000

## 2013-01-23 SURGICAL SUPPLY — 48 items
ADH SKN CLS APL DERMABOND .7 (GAUZE/BANDAGES/DRESSINGS) ×1
APL SKNCLS STERI-STRIP NONHPOA (GAUZE/BANDAGES/DRESSINGS)
APPLIER CLIP 5 13 M/L LIGAMAX5 (MISCELLANEOUS)
APR CLP MED LRG 5 ANG JAW (MISCELLANEOUS)
BAG SPEC RTRVL LRG 6X4 10 (ENDOMECHANICALS) ×1
BENZOIN TINCTURE PRP APPL 2/3 (GAUZE/BANDAGES/DRESSINGS) IMPLANT
BINDER ABD UNIV 12 45-62 (WOUND CARE) IMPLANT
BINDER ABDOMINAL 46IN 62IN (WOUND CARE) ×2
CANISTER SUCTION 2500CC (MISCELLANEOUS) ×2 IMPLANT
CLIP APPLIE 5 13 M/L LIGAMAX5 (MISCELLANEOUS) IMPLANT
CLOTH BEACON ORANGE TIMEOUT ST (SAFETY) ×2 IMPLANT
DECANTER SPIKE VIAL GLASS SM (MISCELLANEOUS) ×2 IMPLANT
DERMABOND ADVANCED (GAUZE/BANDAGES/DRESSINGS) ×1
DERMABOND ADVANCED .7 DNX12 (GAUZE/BANDAGES/DRESSINGS) IMPLANT
DEVICE SECURE STRAP 25 ABSORB (INSTRUMENTS) IMPLANT
DEVICE TROCAR PUNCTURE CLOSURE (ENDOMECHANICALS) ×2 IMPLANT
DRAPE LAPAROSCOPIC ABDOMINAL (DRAPES) ×2 IMPLANT
DRAPE UTILITY XL STRL (DRAPES) ×2 IMPLANT
DRSG PAD ABDOMINAL 8X10 ST (GAUZE/BANDAGES/DRESSINGS) IMPLANT
ELECT REM PT RETURN 9FT ADLT (ELECTROSURGICAL) ×2
ELECTRODE REM PT RTRN 9FT ADLT (ELECTROSURGICAL) ×1 IMPLANT
GLOVE EUDERMIC 7 POWDERFREE (GLOVE) ×2 IMPLANT
GOWN STRL REIN XL XLG (GOWN DISPOSABLE) ×4 IMPLANT
KIT BASIN OR (CUSTOM PROCEDURE TRAY) ×2 IMPLANT
MARKER SKIN DUAL TIP RULER LAB (MISCELLANEOUS) ×2 IMPLANT
MESH VENTRALIGHT ST 8IN CRC (Mesh General) ×1 IMPLANT
NDL SPNL 22GX3.5 QUINCKE BK (NEEDLE) ×1 IMPLANT
NEEDLE SPNL 22GX3.5 QUINCKE BK (NEEDLE) ×2 IMPLANT
NS IRRIG 1000ML POUR BTL (IV SOLUTION) ×2 IMPLANT
POUCH SPECIMEN RETRIEVAL 10MM (ENDOMECHANICALS) ×1 IMPLANT
SCALPEL HARMONIC ACE (MISCELLANEOUS) ×1 IMPLANT
SCISSORS LAP 5X35 DISP (ENDOMECHANICALS) ×2 IMPLANT
SET IRRIG TUBING LAPAROSCOPIC (IRRIGATION / IRRIGATOR) IMPLANT
SOLUTION ANTI FOG 6CC (MISCELLANEOUS) ×2 IMPLANT
STAPLER VISISTAT 35W (STAPLE) IMPLANT
STRIP CLOSURE SKIN 1/2X4 (GAUZE/BANDAGES/DRESSINGS) IMPLANT
SUT MNCRL AB 4-0 PS2 18 (SUTURE) ×2 IMPLANT
SUT NOVA 0 T19/GS 22DT (SUTURE) IMPLANT
SUT NOVA NAB DX-16 0-1 5-0 T12 (SUTURE) ×2 IMPLANT
TACKER 5MM HERNIA 3.5CML NAB (ENDOMECHANICALS) ×3 IMPLANT
TOWEL OR 17X26 10 PK STRL BLUE (TOWEL DISPOSABLE) ×2 IMPLANT
TOWEL OR NON WOVEN STRL DISP B (DISPOSABLE) ×2 IMPLANT
TRAY FOLEY CATH 14FRSI W/METER (CATHETERS) ×2 IMPLANT
TRAY LAP CHOLE (CUSTOM PROCEDURE TRAY) ×2 IMPLANT
TROCAR BLADELESS OPT 5 75 (ENDOMECHANICALS) ×2 IMPLANT
TROCAR SLEEVE XCEL 5X75 (ENDOMECHANICALS) ×4 IMPLANT
TROCAR XCEL NON-BLD 11X100MML (ENDOMECHANICALS) ×2 IMPLANT
TUBING INSUFFLATION 10FT LAP (TUBING) ×2 IMPLANT

## 2013-01-23 NOTE — Anesthesia Preprocedure Evaluation (Signed)
Anesthesia Evaluation  Patient identified by MRN, date of birth, ID band Patient awake    Reviewed: Allergy & Precautions, H&P , NPO status , Patient's Chart, lab work & pertinent test results  Airway Mallampati: II TM Distance: >3 FB Neck ROM: full    Dental no notable dental hx.    Pulmonary Current Smoker,  breath sounds clear to auscultation  Pulmonary exam normal       Cardiovascular Exercise Tolerance: Good negative cardio ROS  Rhythm:regular Rate:Normal     Neuro/Psych negative neurological ROS  negative psych ROS   GI/Hepatic negative GI ROS, Neg liver ROS,   Endo/Other  negative endocrine ROS  Renal/GU negative Renal ROS  negative genitourinary   Musculoskeletal   Abdominal   Peds  Hematology negative hematology ROS (+)   Anesthesia Other Findings   Reproductive/Obstetrics negative OB ROS                           Anesthesia Physical Anesthesia Plan  ASA: II  Anesthesia Plan: General   Post-op Pain Management:    Induction: Intravenous  Airway Management Planned: Oral ETT  Additional Equipment:   Intra-op Plan:   Post-operative Plan: Extubation in OR  Informed Consent: I have reviewed the patients History and Physical, chart, labs and discussed the procedure including the risks, benefits and alternatives for the proposed anesthesia with the patient or authorized representative who has indicated his/her understanding and acceptance.   Dental Advisory Given  Plan Discussed with: CRNA and Surgeon  Anesthesia Plan Comments:         Anesthesia Quick Evaluation  

## 2013-01-23 NOTE — Interval H&P Note (Signed)
History and Physical Interval Note:  01/23/2013 12:25 PM  Stephen Roberts  has presented today for surgery, with the diagnosis of ventral hernia  The goals and the various methods of treatment have been discussed with the patient and family. After consideration of risks, benefits and other options for treatment, the patient has consented to  Procedure(s): LAPAROSCOPIC MULTIPLE VENTRAL HERNIAS (N/A) INSERTION OF MESH (N/A) as a surgical intervention .  The patient's history has been reviewed, patient examined today, no change in status, stable for surgery.  I have reviewed the patient's chart and labs.  Questions were answered to the patient's satisfaction.     Ernestene Mention

## 2013-01-23 NOTE — Transfer of Care (Signed)
Immediate Anesthesia Transfer of Care Note  Patient: Stephen Roberts  Procedure(s) Performed: Procedure(s): LAPAROSCOPIC MULTIPLE VENTRAL HERNIAS (N/A) INSERTION OF MESH (N/A)  Patient Location: PACU  Anesthesia Type:General  Level of Consciousness: awake, alert  and oriented  Airway & Oxygen Therapy: Patient Spontanous Breathing and Patient connected to face mask oxygen  Post-op Assessment: Report given to PACU RN and Post -op Vital signs reviewed and stable  Post vital signs: Reviewed and stable  Complications: No apparent anesthesia complications

## 2013-01-23 NOTE — Anesthesia Procedure Notes (Signed)
Procedure Name: Intubation Date/Time: 01/23/2013 1:02 PM Performed by: Leroy Libman L Patient Re-evaluated:Patient Re-evaluated prior to inductionOxygen Delivery Method: Circle system utilized Preoxygenation: Pre-oxygenation with 100% oxygen Intubation Type: IV induction Ventilation: Mask ventilation without difficulty and Oral airway inserted - appropriate to patient size Grade View: Grade I Tube type: Parker flex tip Tube size: 7.5 mm Number of attempts: 1 Airway Equipment and Method: Video-laryngoscopy Placement Confirmation: ETT inserted through vocal cords under direct vision,  breath sounds checked- equal and bilateral and positive ETCO2 Secured at: 22 cm Tube secured with: Tape Dental Injury: Teeth and Oropharynx as per pre-operative assessment

## 2013-01-23 NOTE — Op Note (Signed)
Patient Name:           Stephen Roberts   Date of Surgery:        01/23/2013  Pre op Diagnosis:      Multiple ventral hernias  Post op Diagnosis:    Same  Procedure:                 Laparoscopic repair of multiple ventral hernias with 20 cm x 20 cm ventraLite ST inlay  mesh  Surgeon:                     Angelia Mould. Derrell Lolling, M.D., FACS  Assistant:                      RNFA  Operative Indications:   Stephen Roberts is a 47 y.o. male. He was referred by Dr. Milinda Antis at [facility] for evaluation of painful abdominal wall hernias.  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 He lifts heavy objects all day long. He has no prior history of hernia. About one month ago while lifting a steel object, he felt some pain just above his umbilicus in the midline. He has had pain ever since. He says he has a bulge above the umbilicus that can be almost as large as orange and will sometimes go down it might. No gastrointestinal symptoms. He had a CT scan at [facility] on [facility].. We have the disc and I have looked at the CT scan myself. It shows fascial defect at the umbilicus and above in the midline traversed by fat and soft tissue. No bowel herniation is noted. Nonobstructing left upper pole renal calculus. Otherwise normal study.   He says he's lost 2 or 3 days work already because it is too painful for him to do his job.  He is somewhat overweight with BMI 33. He is a smoker. No other significant medical problems. He is brought to the operating room electively   Operative Findings:       There was a smaller umbilical hernia with incarcerated preperitoneal fat. Above that in the midline was a epigastric hernia, probably no more than 3 cm in diameter. This also contained preperitoneal fat and falciform ligament entered this area and had to be taken down. No other abnormalities were noted of the liver, stomach, small bowel or large bowel.  Procedure in Detail:          Following the  induction of general endotracheal anesthesia the patient's abdomen was prepped and draped in a sterile fashion. Intravenous antibiotics were given. Surgical time out was performed. 0.5% Marcaine with epinephrine was used as local infiltration anesthetic. A 5 mm optical trocar was placed in the left subcostal costal region. Pneumoperitoneum was created. This was atraumatic. There was no  sign of any bleeding or bowel injury. An 11 mm trocar was placed the left flank and a 5 mm trocar placed in the left lower quadrant. Later in the case I placed  5 mm trocars in the right flank. Using the harmonic scalpel I debrided all the preperitoneal fat out of the 2 hernias. Also took the falciform ligament down extensively to prepare a muscular platform for the mesh mesh fixation. I mapped out the 2 hernias with a spinal needle and found that if I used a 20 cm circular piece of mesh I would have 5-7 cm overlap in all directions. I brought a 20 cm diameter circular ventraLite ST  mesh  to the operative field. I marked a template on the abdominal wall for 8 suture fixation sites. Using #1 Novafil I placed the sutures in the mesh leaving the tails long. I made sure that the rough side of the mesh was positioned to be up toward the abdominal wall and the smooth side of the mesh was positioned down toward the viscera. The mesh was moistened, rolled up and inserted through the trocar. The mesh was positioned within the abdominal cavity, again being careful to keep the rough side up for the abdominal wall. At each of the preplanned suture fixation sites I made a small stab wound and then using the Endo Close pulled the sutures up to the abdominal wall, being careful to take about a 1 cm bite of muscle fascia. After all 8 sutures were placed I lifted them up and found that the mesh deployed nicely. I could tie all 8 sutures down tightly and there was very little redundancy of the mesh. I then secured the mesh with a double crown  technique using a 5 mm Protacker. I inspected this from the right side and the left side multiple times and could not detect any gaps in the outer rim of the fixation. There was no bleeding. The intestine looked fine. The pneumoperitoneum was released and the trocars were removed. The stab wounds in the skin were closed with 4-0 Monocryl and Dermabond. A velcro binder was placed and the patient taken to recovery in stable condition. EBL 20 cc. Counts correct. Complications none.     Angelia Mould. Derrell Lolling, M.D., FACS General and Minimally Invasive Surgery Breast and Colorectal Surgery  01/23/2013 2:46 PM

## 2013-01-23 NOTE — Anesthesia Postprocedure Evaluation (Signed)
  Anesthesia Post-op Note  Patient: Stephen Roberts  Procedure(s) Performed: Procedure(s) (LRB): LAPAROSCOPIC MULTIPLE VENTRAL HERNIAS (N/A) INSERTION OF MESH (N/A)  Patient Location: PACU  Anesthesia Type: General  Level of Consciousness: awake and alert   Airway and Oxygen Therapy: Patient Spontanous Breathing  Post-op Pain: mild  Post-op Assessment: Post-op Vital signs reviewed, Patient's Cardiovascular Status Stable, Respiratory Function Stable, Patent Airway and No signs of Nausea or vomiting  Last Vitals:  Filed Vitals:   01/23/13 1515  BP: 156/119  Pulse: 90  Temp:   Resp: 13    Post-op Vital Signs: stable   Complications: No apparent anesthesia complications

## 2013-01-23 NOTE — Preoperative (Signed)
Beta Blockers   Reason not to administer Beta Blockers:Not Applicable 

## 2013-01-24 ENCOUNTER — Encounter (HOSPITAL_COMMUNITY): Payer: Self-pay | Admitting: General Surgery

## 2013-01-24 MED ORDER — POLYETHYLENE GLYCOL 3350 17 G PO PACK
17.0000 g | PACK | Freq: Once | ORAL | Status: AC
  Administered 2013-01-24: 17 g via ORAL
  Filled 2013-01-24: qty 1

## 2013-01-24 MED ORDER — OXYCODONE-ACETAMINOPHEN 7.5-325 MG PO TABS: 1.0000 | ORAL_TABLET | ORAL | Status: DC | PRN

## 2013-01-24 NOTE — Discharge Summary (Addendum)
Patient ID: Stephen Roberts [id number] 47 y.o. 03/16/1966  Admit date: 01/23/2013  Discharge date and time: 01/24/2013  Admitting Physician: Ernestene Mention  Discharge Physician: Ernestene Mention  Admission Diagnoses: ventral hernia  Discharge Diagnoses: Multiple ventral hernias Tobacco abuse Obesity  Operations: Procedure(s): LAPAROSCOPIC MULTIPLE VENTRAL HERNIAS INSERTION OF MESH  Admission Condition: good  Discharged Condition: good  Indication for Admission: Stephen Roberts is a 47 y.o. male. He was referred by Dr. Milinda Antis at [facility] family [facility] for evaluation of painful abdominal wall hernias.  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 He lifts heavy objects all day long. He has no prior history of hernia. About one month ago while lifting a steel object, he felt some pain just above his umbilicus in the midline. He has had pain ever since. He says he has a bulge above the umbilicus that can be almost as large as orange and will sometimes go down it might. No gastrointestinal symptoms. He had a CT scan at [facility] on [facility].. We have the disc and I have looked at the CT scan myself. It shows fascial defect at the umbilicus and also above in the midline traversed by fat and soft tissue. No bowel herniation is noted. Nonobstructing left upper pole renal calculus. Otherwise normal study. Exam confirms a medium-sized umbilical hernia and a larger midline epigastric hernia above that. He says he's lost 2 or 3 days work already because it is too painful for him to do his job.  He is somewhat overweight with BMI 33. He is a smoker. No other significant medical problems. He is brought to the hospital for elective surgery to repair his hernia   Hospital Course: On the day of admission the patient was taken to the operating room and underwent a laparoscopic repair of his multiple ventral hernias with a 20 cm diameter piece of the ventral light ST mesh. This was  placed internally as an inlay. This was uneventful. Postoperatively he did well. He was able to get up and go to the bathroom and void without difficulty. He was ambulating in the halls.He stated he wanted to go home. The morning after surgery his abdomen was soft but appropriately sore. All the wounds looked good. He was in good spirits and stable. He was given instructions in diet and activity restrictions. He was told to avoid constipation and to stay hydrated. He was given a prescription for Percocet. He will followup with me in the office in 3 weeks.  Consults: None  Significant Diagnostic Studies: none  Treatments: surgery: Laparoscopic repair of multiple ventral hernias with mesh  Disposition: Home  Patient Instructions:    Medication List         HYDROcodone-acetaminophen 5-325 MG per tablet  Commonly known as:  NORCO/VICODIN  Take 1 tablet by mouth every 6 (six) hours as needed for pain.     ibuprofen 200 MG tablet  Commonly known as:  ADVIL,MOTRIN  Take 400 mg by mouth every 6 (six) hours as needed for pain.     oxyCODONE-acetaminophen 7.5-325 MG per tablet  Commonly known as:  PERCOCET  Take 1 tablet by mouth every 4 (four) hours as needed for pain.        Activity: no heavy lifting for 4-6 weeks. No driving for one week. Unlimited ambulation. Able to climb stairs. Diet: low fat, low cholesterol diet Wound Care: none needed  Follow-up:  With Dr. Derrell Lolling in 3 weeks.  Signed: Angelia Mould. Derrell Lolling, M.D., FACS  General and minimally invasive surgery Breast and Colorectal Surgery  01/24/2013, 5:37 AM

## 2013-01-25 ENCOUNTER — Telehealth (INDEPENDENT_AMBULATORY_CARE_PROVIDER_SITE_OTHER): Payer: Self-pay | Admitting: General Surgery

## 2013-01-25 ENCOUNTER — Telehealth (INDEPENDENT_AMBULATORY_CARE_PROVIDER_SITE_OTHER): Payer: Self-pay

## 2013-01-25 ENCOUNTER — Encounter (HOSPITAL_COMMUNITY): Payer: Self-pay

## 2013-01-25 ENCOUNTER — Inpatient Hospital Stay (HOSPITAL_COMMUNITY)
Admission: EM | Admit: 2013-01-25 | Discharge: 2013-01-28 | DRG: 394 | Disposition: A | Discharge status: Home / Self Care | Payer: Worker's Compensation | Attending: General Surgery | Admitting: General Surgery

## 2013-01-25 ENCOUNTER — Emergency Department (HOSPITAL_COMMUNITY): Payer: Worker's Compensation

## 2013-01-25 DIAGNOSIS — R109 Unspecified abdominal pain: Secondary | ICD-10-CM

## 2013-01-25 DIAGNOSIS — K929 Disease of digestive system, unspecified: Principal | ICD-10-CM | POA: Diagnosis present

## 2013-01-25 DIAGNOSIS — M129 Arthropathy, unspecified: Secondary | ICD-10-CM | POA: Diagnosis present

## 2013-01-25 DIAGNOSIS — K560 Paralytic ileus: Secondary | ICD-10-CM | POA: Diagnosis present

## 2013-01-25 DIAGNOSIS — R011 Cardiac murmur, unspecified: Secondary | ICD-10-CM | POA: Diagnosis present

## 2013-01-25 DIAGNOSIS — K567 Ileus, unspecified: Secondary | ICD-10-CM

## 2013-01-25 DIAGNOSIS — E669 Obesity, unspecified: Secondary | ICD-10-CM | POA: Diagnosis present

## 2013-01-25 DIAGNOSIS — K219 Gastro-esophageal reflux disease without esophagitis: Secondary | ICD-10-CM | POA: Diagnosis present

## 2013-01-25 DIAGNOSIS — Y839 Surgical procedure, unspecified as the cause of abnormal reaction of the patient, or of later complication, without mention of misadventure at the time of the procedure: Secondary | ICD-10-CM | POA: Diagnosis present

## 2013-01-25 DIAGNOSIS — F17200 Nicotine dependence, unspecified, uncomplicated: Secondary | ICD-10-CM | POA: Diagnosis present

## 2013-01-25 DIAGNOSIS — Z6833 Body mass index (BMI) 33.0-33.9, adult: Secondary | ICD-10-CM

## 2013-01-25 DIAGNOSIS — K5900 Constipation, unspecified: Secondary | ICD-10-CM | POA: Diagnosis present

## 2013-01-25 LAB — CBC WITH DIFFERENTIAL/PLATELET
Basophils Absolute: 0 10*3/uL (ref 0.0–0.1)
Eosinophils Relative: 3 % (ref 0–5)
Lymphocytes Relative: 24 % (ref 12–46)
Lymphs Abs: 2.3 10*3/uL (ref 0.7–4.0)
MCV: 97.1 fL (ref 78.0–100.0)
Neutro Abs: 6.1 10*3/uL (ref 1.7–7.7)
Neutrophils Relative %: 65 % (ref 43–77)
Platelets: 146 10*3/uL — ABNORMAL LOW (ref 150–400)
RBC: 4.54 MIL/uL (ref 4.22–5.81)
WBC: 9.4 10*3/uL (ref 4.0–10.5)

## 2013-01-25 LAB — COMPREHENSIVE METABOLIC PANEL
ALT: 27 U/L (ref 0–53)
AST: 29 U/L (ref 0–37)
Alkaline Phosphatase: 99 U/L (ref 39–117)
CO2: 26 mEq/L (ref 19–32)
Chloride: 103 mEq/L (ref 96–112)
GFR calc Af Amer: >90 mL/min (ref >90)
GFR calc non Af Amer: >90 mL/min (ref >90)
Glucose, Bld: 112 mg/dL — ABNORMAL HIGH (ref 70–99)
Potassium: 3.9 mEq/L (ref 3.5–5.1)
Sodium: 139 mEq/L (ref 135–145)
Total Bilirubin: 0.2 mg/dL — ABNORMAL LOW (ref 0.3–1.2)

## 2013-01-25 MED ORDER — ONDANSETRON HCL 4 MG/2ML IJ SOLN
4.0000 mg | Freq: Once | INTRAMUSCULAR | Status: AC
  Administered 2013-01-25: 4 mg via INTRAVENOUS
  Filled 2013-01-25: qty 2

## 2013-01-25 MED ORDER — SODIUM CHLORIDE 0.9 % IV SOLN
1000.0000 mL | INTRAVENOUS | Status: DC
  Administered 2013-01-25: 1000 mL via INTRAVENOUS

## 2013-01-25 NOTE — Telephone Encounter (Signed)
Pt calling in b/c he is having problems with constipation. The pt has not had a BM in two days and he has been taking the Miralax/stool softners liked advised by Dr Derrell Lolling. The pt is starting to have more discomfort from not having a BM so I advised pt that he needed to be on liquid diet until he has a BM. I advised pt per protocol that the pt needed to get some Milk of Magnesia to take 4 tbls. I advised pt that if in a few hours if nothing has happened the pt might have to repeat the dose of Milk of Magnesia. The pt understands.

## 2013-01-25 NOTE — ED Notes (Signed)
No difficulty with urination, no fever, no drainage at incision.

## 2013-01-25 NOTE — ED Notes (Signed)
Per pt, had hernia surgery Wednesday.  Pt states no bm since surgery.  Pt now with abdominal pain.

## 2013-01-25 NOTE — ED Provider Notes (Signed)
CSN: [id number]     Arrival date & time 01/25/13  2109 History   First MD Initiated Contact with Patient 01/25/13 2219     Chief Complaint  Patient presents with  . Abdominal Pain  . Constipation   (Consider location/radiation/quality/duration/timing/severity/associated sxs/prior Treatment) The history is provided by the patient and medical records. No language interpreter was used.    Stephen Roberts is a 47 y.o. male  with a hx of arthritis, GERD, pneumonia presents to the Emergency Department complaining of gradual, intermittent, progressively worsening abdominal pain and constipation onset 2 days ago after having hernia surgery.  Patient was discharged home with Percocet for pain, stool softeners and recommendation to take MiraLax.  Patient reports is only taking one Percocet has had 2 doses of MiraLax, 2 doses of milk of magnesia and a stool softener all without a bowel movement.  Patient had Laparoscopic repair of multiple ventral hernias with mesh Dr. Derrell Lolling on 01/23/2013.  OP note indicates no complications at the time of surgery.  Pt has been eating and drinking normally but has increased pain with each meal.  Associated symptoms include bloating, chills, no flatulence.  Nothing makes it better and  eating makes it worse.  Pt denies fever, headache, neck pain, chest pain, SOB, vomiting, diarrhea, weakness, dizziness, syncope, dysuria, hematuria.     Past Medical History  Diagnosis Date  . Arthritis   . Heart murmur   . GERD (gastroesophageal reflux disease)   . Pneumonia 06/27/2006   Past Surgical History  Procedure Laterality Date  . Anterior fusion cervical spine    . Tonsillectomy      as child  . Ventral hernia repair N/A 01/23/2013    Procedure: LAPAROSCOPIC MULTIPLE VENTRAL HERNIAS;  Surgeon: Ernestene Mention, MD;  Location: [facility];  Service: General;  Laterality: N/A;  . Insertion of mesh N/A 01/23/2013    Procedure: INSERTION OF MESH;  Surgeon: Ernestene Mention, MD;   Location: [facility];  Service: General;  Laterality: N/A;   History reviewed. No pertinent family history. History  Substance Use Topics  . Smoking status: Current Every Day Smoker -- 0.50 packs/day for 30 years    Types: Cigarettes  . Smokeless tobacco: Never Used  . Alcohol Use: Yes     Comment: occassionally    Review of Systems  Constitutional: Negative for fever, diaphoresis, appetite change, fatigue and unexpected weight change.  HENT: Negative for mouth sores and neck stiffness.   Eyes: Negative for visual disturbance.  Respiratory: Negative for cough, chest tightness, shortness of breath and wheezing.   Cardiovascular: Negative for chest pain.  Gastrointestinal: Positive for abdominal pain and abdominal distention. Negative for nausea, vomiting, diarrhea and constipation.  Endocrine: Negative for polydipsia, polyphagia and polyuria.  Genitourinary: Negative for dysuria, urgency, frequency and hematuria.  Musculoskeletal: Negative for back pain.  Skin: Negative for rash.  Allergic/Immunologic: Negative for immunocompromised state.  Neurological: Negative for syncope, light-headedness and headaches.  Hematological: Does not bruise/bleed easily.  Psychiatric/Behavioral: Negative for sleep disturbance. The patient is not nervous/anxious.     Allergies  Review of patient's allergies indicates no known allergies.  Home Medications   Current Outpatient Rx  Name  Route  Sig  Dispense  Refill  . docusate sodium (COLACE) 100 MG capsule   Oral   Take 100 mg by mouth 2 (two) times daily as needed for constipation.         Marland Kitchen HYDROcodone-acetaminophen (NORCO/VICODIN) 5-325 MG per tablet   Oral  Take 1 tablet by mouth every 6 (six) hours as needed for pain.         Marland Kitchen ibuprofen (ADVIL,MOTRIN) 200 MG tablet   Oral   Take 400 mg by mouth every 6 (six) hours as needed for pain.         . magnesium hydroxide (MILK OF MAGNESIA) 400 MG/5ML suspension   Oral   Take 30 mLs by  mouth 2 (two) times daily as needed for constipation.         Marland Kitchen oxyCODONE-acetaminophen (PERCOCET) 7.5-325 MG per tablet   Oral   Take 1 tablet by mouth every 4 (four) hours as needed for pain.   40 tablet   0   . polyethylene glycol (MIRALAX / GLYCOLAX) packet   Oral   Take 17 g by mouth 2 (two) times daily as needed (constipation).          BP 130/96  Pulse 81  Temp(Src) 98.8 F (37.1 C) (Oral)  Resp 18  SpO2 95% Physical Exam  Nursing note and vitals reviewed. Constitutional: He is oriented to person, place, and time. He appears well-developed and well-nourished. No distress.  Awake, alert, nontoxic appearance  HENT:  Head: Normocephalic and atraumatic.  Mouth/Throat: Oropharynx is clear and moist. No oropharyngeal exudate.  Eyes: Conjunctivae are normal. Pupils are equal, round, and reactive to light. No scleral icterus.  Neck: Normal range of motion. Neck supple.  Cardiovascular: Normal rate, regular rhythm, normal heart sounds and intact distal pulses.   No murmur heard. Pulmonary/Chest: Effort normal and breath sounds normal. No respiratory distress. He has no wheezes.  Abdominal: Soft. Bowel sounds are normal. He exhibits no distension and no mass. There is generalized tenderness. There is guarding. There is no rigidity, no rebound and no CVA tenderness.  Patient with exquisite abdominal tenderness to palpation Laparoscopic incisions well healing without erythema, induration or drainage Peritoneal signs Mild abdominal distention  Musculoskeletal: Normal range of motion. He exhibits no edema.  Lymphadenopathy:    He has no cervical adenopathy.  Neurological: He is alert and oriented to person, place, and time. Coordination normal.  Speech is clear and goal oriented Moves extremities without ataxia  Skin: Skin is warm and dry. No rash noted. He is not diaphoretic. No erythema.  Psychiatric: He has a normal mood and affect. His behavior is normal.    ED Course   Procedures (including critical care time) Labs Review Labs Reviewed  CBC WITH DIFFERENTIAL - Abnormal; Notable for the following:    Platelets 146 (*)    All other components within normal limits  COMPREHENSIVE METABOLIC PANEL - Abnormal; Notable for the following:    Glucose, Bld 112 (*)    Total Bilirubin 0.2 (*)    All other components within normal limits  LIPASE, BLOOD  URINALYSIS, ROUTINE W REFLEX MICROSCOPIC   Imaging Review Dg Abd Acute W/chest  01/25/2013   CLINICAL DATA:  Abdominal pain and constipation. Hernia surgery 2 days prior.  EXAM: ACUTE ABDOMEN SERIES (ABDOMEN 2 VIEW & CHEST 1 VIEW)  COMPARISON:  Chest x-ray 01/22/2013.  FINDINGS: Lungs clear. No effusion or pneumothorax. Normal heart size. Incidental cervicothoracic ACDF with ventral plate and screw fixation.  Fluid levels noted within nondilated proximal colon and possibly right lower quadrant small bowel. No gas dilated small bowel seen. No pneumoperitoneum. Postsurgical changes of ventral abdominal wall hernia repair.  No significant osseous findings.  IMPRESSION: 1. Negative for high-grade obstruction. Few fluid levels in the right abdomen may reflect  slow transit/ adynamic ileus.  2. Clear chest.   Electronically Signed   By: Tiburcio Pea   On: 01/25/2013 23:26    MDM   1. Abdominal  pain, other specified site   2. Ileus, postoperative    Stephen Roberts presents with abdominal pain and constipation 2 days status post laparoscopic ventral hernia repair.  Patient with exquisite abdominal tenderness on exam, no erythema or induration of incision sites to indicate cellulitis. Patient afebrile, non-tachycardic, nontoxic, nonseptic appearing. He has no leukocytosis and CMP and lipase are unremarkable.  Acute abdomen chest shows possible slow transit versus adynamic ileus but is negative for high-grade instruction.  I personally reviewed the imaging tests through PACS system.  I reviewed available ER/hospitalization  records through the EMR.  Will discuss with [facility] for further recommendations on treatment and admission.    [facility], Dr. Johna Sheriff will evaluate patient and determine disposition.  Patient pain control at this time. Vital signs stable.  BP 130/96  Pulse 81  Temp(Src) 98.8 F (37.1 C) (Oral)  Resp 18  SpO2 95%   Dierdre Forth, PA-C 01/26/13 0107

## 2013-01-25 NOTE — Telephone Encounter (Signed)
Pt called C/O constipation and increased cramp lower abd pain.  Took further MOM per instructions 1 hr ago. Encouraged to drink water and give this a little more time, and if pain persists or worsens will neeed to go to ER

## 2013-01-26 LAB — URINALYSIS, ROUTINE W REFLEX MICROSCOPIC
Bilirubin Urine: NEGATIVE
Glucose, UA: NEGATIVE mg/dL
Hgb urine dipstick: NEGATIVE
Protein, ur: NEGATIVE mg/dL
Urobilinogen, UA: 0.2 mg/dL (ref 0.0–1.0)

## 2013-01-26 LAB — GLUCOSE, CAPILLARY: Glucose-Capillary: 92 mg/dL (ref 70–99)

## 2013-01-26 MED ORDER — ONDANSETRON HCL 4 MG/2ML IJ SOLN: 4.0000 mg | Freq: Four times a day (QID) | INTRAMUSCULAR | Status: DC | PRN

## 2013-01-26 MED ORDER — DOCUSATE SODIUM 100 MG PO CAPS
100.0000 mg | ORAL_CAPSULE | Freq: Two times a day (BID) | ORAL | Status: DC | PRN
  Filled 2013-01-26: qty 1

## 2013-01-26 MED ORDER — HYDROMORPHONE HCL PF 1 MG/ML IJ SOLN
INTRAMUSCULAR | Status: AC
  Filled 2013-01-26: qty 1

## 2013-01-26 MED ORDER — OXYCODONE HCL 5 MG PO TABS: 5.0000 mg | ORAL_TABLET | ORAL | Status: DC | PRN

## 2013-01-26 MED ORDER — HYDROMORPHONE HCL PF 1 MG/ML IJ SOLN
INTRAMUSCULAR | Status: AC
  Administered 2013-01-26: 1 mg via INTRAVENOUS
  Filled 2013-01-26: qty 1

## 2013-01-26 MED ORDER — HYDROMORPHONE HCL PF 1 MG/ML IJ SOLN
1.0000 mg | INTRAMUSCULAR | Status: DC | PRN
  Administered 2013-01-26 (×2): 1 mg via INTRAVENOUS
  Administered 2013-01-26: 2 mg via INTRAVENOUS
  Administered 2013-01-26 (×2): 1 mg via INTRAVENOUS
  Administered 2013-01-27: 2 mg via INTRAVENOUS
  Administered 2013-01-27 – 2013-01-28 (×7): 1 mg via INTRAVENOUS
  Filled 2013-01-26 (×2): qty 1
  Filled 2013-01-26: qty 2
  Filled 2013-01-26 (×3): qty 1
  Filled 2013-01-26: qty 2
  Filled 2013-01-26 (×4): qty 1

## 2013-01-26 MED ORDER — KCL IN DEXTROSE-NACL 20-5-0.9 MEQ/L-%-% IV SOLN
INTRAVENOUS | Status: DC
  Administered 2013-01-27: 15:00:00 via INTRAVENOUS
  Filled 2013-01-26 (×8): qty 1000

## 2013-01-26 MED ORDER — POLYETHYLENE GLYCOL 3350 17 G PO PACK
17.0000 g | PACK | Freq: Two times a day (BID) | ORAL | Status: DC | PRN
  Filled 2013-01-26: qty 1

## 2013-01-26 MED ORDER — HYDROMORPHONE HCL PF 1 MG/ML IJ SOLN
1.0000 mg | Freq: Once | INTRAMUSCULAR | Status: AC
  Administered 2013-01-26: 1 mg via INTRAVENOUS
  Filled 2013-01-26: qty 1

## 2013-01-26 MED ORDER — HEPARIN SODIUM (PORCINE) 5000 UNIT/ML IJ SOLN
5000.0000 [IU] | Freq: Three times a day (TID) | INTRAMUSCULAR | Status: DC
  Administered 2013-01-26 – 2013-01-27 (×6): 5000 [IU] via SUBCUTANEOUS
  Filled 2013-01-26 (×10): qty 1

## 2013-01-26 NOTE — H&P (Signed)
Stephen Roberts is an 47 y.o. male.   Chief Complaint: Abdominal pain HPI: Patient is 2 days status post laparoscopic repair of primary ventral hernias. He was discharged yesterday. The patient called the office earlier today complaining of abdominal pressure and constipation. He took recommended laxatives and has had increased abdominal pain. He called back tonight complaining of severe abdominal pain  And inability to have a bowel movement and I asked him to come to the emergency department for evaluation. He describes quite severe generalized abdominal pain which has been worse when he tries to eat anything. He described as pressure-like pain. It is currently better after medications in the emergency department. He has felt like he has been constipated and has not had a bowel movement in 4 days. No nausea or vomiting. No fever or chills.  Past Medical History  Diagnosis Date  . Arthritis   . Heart murmur   . GERD (gastroesophageal reflux disease)   . Pneumonia 06/27/2006    Past Surgical History  Procedure Laterality Date  . Anterior fusion cervical spine    . Tonsillectomy      as child  . Ventral hernia repair N/A 01/23/2013    Procedure: LAPAROSCOPIC MULTIPLE VENTRAL HERNIAS;  Surgeon: Ernestene Mention, MD;  Location: [facility];  Service: General;  Laterality: N/A;  . Insertion of mesh N/A 01/23/2013    Procedure: INSERTION OF MESH;  Surgeon: Ernestene Mention, MD;  Location: [facility];  Service: General;  Laterality: N/A;    History reviewed. No pertinent family history. Social History:  reports that he has been smoking Cigarettes.  He has a 15 pack-year smoking history. He has never used smokeless tobacco. He reports that  drinks alcohol. He reports that he does not use illicit drugs.  Allergies: No Known Allergies   (Not in a hospital admission)  Results for orders placed during the hospital encounter of 01/25/13 (from the past 48 hour(s))  CBC WITH DIFFERENTIAL     Status: Abnormal    Collection Time    01/25/13 10:45 PM      Result Value Range   WBC 9.4  4.0 - 10.5 K/uL   RBC 4.54  4.22 - 5.81 MIL/uL   Hemoglobin 15.1  13.0 - 17.0 g/dL   HCT 16.1  09.6 - 04.5 %   MCV 97.1  78.0 - 100.0 fL   MCH 33.3  26.0 - 34.0 pg   MCHC 34.2  30.0 - 36.0 g/dL   RDW 40.9  81.1 - 91.4 %   Platelets 146 (*) 150 - 400 K/uL   Neutrophils Relative % 65  43 - 77 %   Neutro Abs 6.1  1.7 - 7.7 K/uL   Lymphocytes Relative 24  12 - 46 %   Lymphs Abs 2.3  0.7 - 4.0 K/uL   Monocytes Relative 8  3 - 12 %   Monocytes Absolute 0.8  0.1 - 1.0 K/uL   Eosinophils Relative 3  0 - 5 %   Eosinophils Absolute 0.2  0.0 - 0.7 K/uL   Basophils Relative 0  0 - 1 %   Basophils Absolute 0.0  0.0 - 0.1 K/uL  COMPREHENSIVE METABOLIC PANEL     Status: Abnormal   Collection Time    01/25/13 10:45 PM      Result Value Range   Sodium 139  135 - 145 mEq/L   Potassium 3.9  3.5 - 5.1 mEq/L   Chloride 103  96 - 112 mEq/L  CO2 26  19 - 32 mEq/L   Glucose, Bld 112 (*) 70 - 99 mg/dL   BUN 16  6 - 23 mg/dL   Creatinine, Ser 1.61  0.50 - 1.35 mg/dL   Calcium 9.4  8.4 - 09.6 mg/dL   Total Protein 7.4  6.0 - 8.3 g/dL   Albumin 3.5  3.5 - 5.2 g/dL   AST 29  0 - 37 U/L   ALT 27  0 - 53 U/L   Alkaline Phosphatase 99  39 - 117 U/L   Total Bilirubin 0.2 (*) 0.3 - 1.2 mg/dL   GFR calc non Af Amer >90  >90 mL/min   GFR calc Af Amer >90  >90 mL/min   Comment: (NOTE)     The eGFR has been calculated using the CKD EPI equation.     This calculation has not been validated in all clinical situations.     eGFR's persistently <90 mL/min signify possible Chronic Kidney     Disease.  LIPASE, BLOOD     Status: None   Collection Time    01/25/13 10:45 PM      Result Value Range   Lipase 21  11 - 59 U/L   Dg Abd Acute W/chest  01/25/2013   CLINICAL DATA:  Abdominal pain and constipation. Hernia surgery 2 days prior.  EXAM: ACUTE ABDOMEN SERIES (ABDOMEN 2 VIEW & CHEST 1 VIEW)  COMPARISON:  Chest x-ray 01/22/2013.   FINDINGS: Lungs clear. No effusion or pneumothorax. Normal heart size. Incidental cervicothoracic ACDF with ventral plate and screw fixation.  Fluid levels noted within nondilated proximal colon and possibly right lower quadrant small bowel. No gas dilated small bowel seen. No pneumoperitoneum. Postsurgical changes of ventral abdominal wall hernia repair.  No significant osseous findings.  IMPRESSION: 1. Negative for high-grade obstruction. Few fluid levels in the right abdomen may reflect slow transit/ adynamic ileus.  2. Clear chest.   Electronically Signed   By: Tiburcio Pea   On: 01/25/2013 23:26    Review of Systems  Constitutional: Negative for fever and chills.  Respiratory: Negative.   Cardiovascular: Negative.   Gastrointestinal: Positive for abdominal pain and constipation. Negative for nausea, vomiting and diarrhea.  Genitourinary: Negative.   Musculoskeletal: Negative.     Blood pressure 130/96, pulse 81, temperature 98.8 F (37.1 C), temperature source Oral, resp. rate 18, SpO2 95.00%. Physical Exam  Gen.: Mildly overweight alert and currently in no distress Skin: No rashes infection Lungs: Clear equal breath sounds bilaterally without increased work of breathing Cardiovascular: Regular rate and rhythm. No murmurs. No edema. Abdomen: Healed laparoscopic incisions. Possibly mildly distended. Bowel sounds are present but hypoactive. There is diffuse tenderness mostly around the incisions. No peritoneal signs. Incision is healing without evidence of infection. Rectal: Nontender and minimal stool in the rectal vault Extremities: No edema or calf tenderness  Assessment/Plan Worsening abdominal pressure-like pain post laparoscopic ventral hernia repair. Imaging shows apparent ileus and increased stool and left colon. There is no evidence on imaging or lab work or really on physical exam to suggest a severe complications such as bowel injury Or small bowel obstruction. I think this  likely represents a combination of ileus and uncontrolled incisional pain. The patient will be admitted for observation and symptom management.  Tomasa Dobransky T 01/26/2013, 1:26 AM

## 2013-01-26 NOTE — Progress Notes (Signed)
Pt  Ambulated to floor w/ spouse, room 1504. Gait steady, general weakness. VS taken, pt oriented to room with no complications. Pain 4/10 and states tolerable. Will continue to monitor throughout shift. Initial assessment completed.

## 2013-01-26 NOTE — ED Provider Notes (Signed)
Medical screening examination/treatment/procedure(s) were performed by non-physician practitioner and as supervising physician I was immediately available for consultation/collaboration.   Evaan Tidwell, MD 01/26/13 0714 

## 2013-01-26 NOTE — Progress Notes (Signed)
  Subjective: Pt with abd pain better with pain Rx.  Still no flatus or BM.  Objective: Vital signs in last 24 hours: Temp:  [98.3 F (36.8 C)-98.8 F (37.1 C)] 98.3 F (36.8 C) (09/13 0539) Pulse Rate:  [79-84] 84 (09/13 0539) Resp:  [18] 18 (09/13 0539) BP: (122-130)/(82-96) 130/85 mmHg (09/13 0539) SpO2:  [94 %-95 %] 94 % (09/13 0539) Weight:  [225 lb 8.5 oz (102.3 kg)] 225 lb 8.5 oz (102.3 kg) (09/13 0218) Last BM Date: 01/22/13  Intake/Output from previous day: 09/12 0701 - 09/13 0700 In: -  Out: 350 [Urine:350] Intake/Output this shift:    General appearance: alert and cooperative GI: soft, approp tpp , hypoactive BS, wound c/d/i, no rebound/ guarding  Lab Results:   Recent Labs  01/23/13 1648 01/25/13 2245  WBC 11.7* 9.4  HGB 15.3 15.1  HCT 44.7 44.1  PLT 155 146*   BMET  Recent Labs  01/23/13 1648 01/25/13 2245  NA  --  139  K  --  3.9  CL  --  103  CO2  --  26  GLUCOSE  --  112*  BUN  --  16  CREATININE 0.90 0.79  CALCIUM  --  9.4   PT/INR No results found for this basename: LABPROT, INR,  in the last 72 hours ABG No results found for this basename: PHART, PCO2, PO2, HCO3,  in the last 72 hours  Studies/Results: Dg Abd Acute W/chest  01/25/2013   CLINICAL DATA:  Abdominal pain and constipation. Hernia surgery 2 days prior.  EXAM: ACUTE ABDOMEN SERIES (ABDOMEN 2 VIEW & CHEST 1 VIEW)  COMPARISON:  Chest x-ray 01/22/2013.  FINDINGS: Lungs clear. No effusion or pneumothorax. Normal heart size. Incidental cervicothoracic ACDF with ventral plate and screw fixation.  Fluid levels noted within nondilated proximal colon and possibly right lower quadrant small bowel. No gas dilated small bowel seen. No pneumoperitoneum. Postsurgical changes of ventral abdominal wall hernia repair.  No significant osseous findings.  IMPRESSION: 1. Negative for high-grade obstruction. Few fluid levels in the right abdomen may reflect slow transit/ adynamic ileus.  2. Clear  chest.   Electronically Signed   By: Tiburcio Pea   On: 01/25/2013 23:26    Anti-infectives: Anti-infectives   None      Assessment/Plan: s/p * No surgery found * Con't NPO, ice chips OK IVF Encourage amb Await bowel function  LOS: 1 day    Marigene Ehlers., [facility] 01/26/2013

## 2013-01-27 ENCOUNTER — Inpatient Hospital Stay (HOSPITAL_COMMUNITY): Payer: Worker's Compensation

## 2013-01-27 NOTE — Progress Notes (Signed)
This shift pt ambulated in hall 5x  w/ no difficulty.Will continue to monitor this shift.

## 2013-01-27 NOTE — Progress Notes (Signed)
  Subjective: States he has started passing flatus this AM.  Still feels liek his abd is distended.  Objective: Vital signs in last 24 hours: Temp:  [97.5 F (36.4 C)-98.3 F (36.8 C)] 98.3 F (36.8 C) (09/14 0637) Pulse Rate:  [65-80] 70 (09/14 0637) Resp:  [18-20] 18 (09/14 0637) BP: (126-159)/(58-99) 156/99 mmHg (09/14 0637) SpO2:  [94 %-98 %] 97 % (09/14 0637) Last BM Date: 01/22/13  Intake/Output from previous day: 09/13 0701 - 09/14 0700 In: 2700 [I.V.:2700] Out: 1500 [Urine:1500] Intake/Output this shift:    General appearance: alert and cooperative GI: soft, min dist, hypoactive BS  Lab Results:   Recent Labs  01/25/13 2245  WBC 9.4  HGB 15.1  HCT 44.1  PLT 146*   BMET  Recent Labs  01/25/13 2245  NA 139  K 3.9  CL 103  CO2 26  GLUCOSE 112*  BUN 16  CREATININE 0.79  CALCIUM 9.4   PT/INR No results found for this basename: LABPROT, INR,  in the last 72 hours ABG No results found for this basename: PHART, PCO2, PO2, HCO3,  in the last 72 hours  Studies/Results: Dg Abd Acute W/chest  01/25/2013   CLINICAL DATA:  Abdominal pain and constipation. Hernia surgery 2 days prior.  EXAM: ACUTE ABDOMEN SERIES (ABDOMEN 2 VIEW & CHEST 1 VIEW)  COMPARISON:  Chest x-ray 01/22/2013.  FINDINGS: Lungs clear. No effusion or pneumothorax. Normal heart size. Incidental cervicothoracic ACDF with ventral plate and screw fixation.  Fluid levels noted within nondilated proximal colon and possibly right lower quadrant small bowel. No gas dilated small bowel seen. No pneumoperitoneum. Postsurgical changes of ventral abdominal wall hernia repair.  No significant osseous findings.  IMPRESSION: 1. Negative for high-grade obstruction. Few fluid levels in the right abdomen may reflect slow transit/ adynamic ileus.  2. Clear chest.   Electronically Signed   By: Tiburcio Pea   On: 01/25/2013 23:26   Dg Abd Portable 1v  01/27/2013   *RADIOLOGY REPORT*  Clinical Data: 3 days postop  hernia repair with abdominal distension and constipation  PORTABLE ABDOMEN - 1 VIEW  Comparison: 01/25/2013  Findings: There are dilated loops of small bowel, but there is also gas into the left colon.  No gas seen beyond the mid descending colon.  IMPRESSION: Findings most consistent with postoperative ileus.  Partial low grade  small bowel obstruction not excluded.   Original Report Authenticated By: Esperanza Heir, M.D.    Anti-infectives: Anti-infectives   None      Assessment/Plan: s/p * No surgery found * con't to ambulating con't NPO for now, if passing more gas and abd less dist, may tol clears   LOS: 2 days    Marigene Ehlers., [facility] 01/27/2013

## 2013-01-28 LAB — CBC
Hemoglobin: 15.1 g/dL (ref 13.0–17.0)
Platelets: 158 10*3/uL (ref 150–400)
RBC: 4.51 MIL/uL (ref 4.22–5.81)
WBC: 7.7 10*3/uL (ref 4.0–10.5)

## 2013-01-28 LAB — BASIC METABOLIC PANEL
CO2: 24 mEq/L (ref 19–32)
Calcium: 9.6 mg/dL (ref 8.4–10.5)
Chloride: 97 mEq/L (ref 96–112)
Glucose, Bld: 100 mg/dL — ABNORMAL HIGH (ref 70–99)
Potassium: 4.1 mEq/L (ref 3.5–5.1)
Sodium: 131 mEq/L — ABNORMAL LOW (ref 135–145)

## 2013-01-28 MED ORDER — BISACODYL 10 MG RE SUPP
10.0000 mg | Freq: Two times a day (BID) | RECTAL | Status: DC
  Administered 2013-01-28: 10 mg via RECTAL
  Filled 2013-01-28: qty 1

## 2013-01-28 MED ORDER — SODIUM CHLORIDE 0.9 % IV SOLN
INTRAVENOUS | Status: DC
  Administered 2013-01-28: 100 mL/h via INTRAVENOUS

## 2013-01-28 MED ORDER — KETOROLAC TROMETHAMINE 30 MG/ML IJ SOLN
30.0000 mg | Freq: Four times a day (QID) | INTRAMUSCULAR | Status: DC | PRN
  Administered 2013-01-28: 30 mg via INTRAVENOUS
  Filled 2013-01-28: qty 1

## 2013-01-28 MED ORDER — TRAMADOL HCL 50 MG PO TABS: 100.0000 mg | ORAL_TABLET | Freq: Two times a day (BID) | ORAL | Status: DC | PRN

## 2013-01-28 MED ORDER — ENOXAPARIN SODIUM 60 MG/0.6ML ~~LOC~~ SOLN
50.0000 mg | Freq: Every morning | SUBCUTANEOUS | Status: DC
  Administered 2013-01-28: 10:00:00 50 mg via SUBCUTANEOUS
  Filled 2013-01-28: qty 0.6

## 2013-01-28 MED ORDER — HYDROCODONE-ACETAMINOPHEN 5-325 MG PO TABS
1.0000 | ORAL_TABLET | ORAL | Status: DC | PRN
  Administered 2013-01-28: 2 via ORAL
  Filled 2013-01-28: qty 2

## 2013-01-28 NOTE — Progress Notes (Signed)
  Subjective: Feeling much better. Very hungry. Asking for grits. Passing lots of flatus but no stool. Says he's never been nauseated. Feels like the distention and pain are resolving.  Lab work looks good. WBC 7700.Sodium 131.   Potassium 4.1. BUN 12. Creatinine 0.73. Glucose 100.  Objective: Vital signs in last 24 hours: Temp:  [97.3 F (36.3 C)-99.5 F (37.5 C)] 99.2 F (37.3 C) (09/15 0241) Pulse Rate:  [64-75] 68 (09/15 0241) Resp:  [16-18] 18 (09/15 0241) BP: (130-148)/(70-93) 130/86 mmHg (09/15 0241) SpO2:  [94 %-99 %] 94 % (09/15 0241) Last BM Date: 01/22/13  Intake/Output from previous day: 09/14 0701 - 09/15 0700 In: 800 [I.V.:800] Out: 700 [Urine:700] Intake/Output this shift:    General appearance: alert. Cooperative. No distress. Mental status normal. In good spirits. GI: somewhat obese. Soft. Not distended or tympanitic. Incisions look good. Appropriate incisional soreness.  Lab Results:  Results for orders placed during the hospital encounter of 01/25/13 (from the past 24 hour(s))  CBC     Status: None   Collection Time    01/28/13  4:48 AM      Result Value Range   WBC 7.7  4.0 - 10.5 K/uL   RBC 4.51  4.22 - 5.81 MIL/uL   Hemoglobin 15.1  13.0 - 17.0 g/dL   HCT 21.3  08.6 - 57.8 %   MCV 96.0  78.0 - 100.0 fL   MCH 33.5  26.0 - 34.0 pg   MCHC 34.9  30.0 - 36.0 g/dL   RDW 46.9  62.9 - 52.8 %   Platelets 158  150 - 400 K/uL  BASIC METABOLIC PANEL     Status: Abnormal   Collection Time    01/28/13  4:48 AM      Result Value Range   Sodium 131 (*) 135 - 145 mEq/L   Potassium 4.1  3.5 - 5.1 mEq/L   Chloride 97  96 - 112 mEq/L   CO2 24  19 - 32 mEq/L   Glucose, Bld 100 (*) 70 - 99 mg/dL   BUN 12  6 - 23 mg/dL   Creatinine, Ser 4.13  0.50 - 1.35 mg/dL   Calcium 9.6  8.4 - 24.4 mg/dL   GFR calc non Af Amer >90  >90 mL/min   GFR calc Af Amer >90  >90 mL/min     Studies/Results: @RISRSLT24 @  . bisacodyl  10 mg Rectal BID  . enoxaparin (LOVENOX)  injection  40 mg Subcutaneous q morning - 10a     Assessment/Plan: Post operative ileus, resolving. Clinically there is no evidence for small bowel obstruction. Begin full liquid diet. Need to back off on narcotic use significantly, and he agrees that he can do that. Dulcolax  suppository. Prune-juice.  Ambulate more.  POD #5. Uneventful laparoscopic ventral hernia repair with mesh. At the time of surgery there was essentially no manipulation of the GI tract, so I theorize that narcotics and anesthesia are the etiology of his ileus.  Tobacco abuse  Obesity, BMI 33   @PROBHOSP @  LOS: 3 days    Stephen Roberts 01/28/2013  . .prob

## 2013-01-28 NOTE — Progress Notes (Signed)
Patient discharge home with wife, alert and oriented, discharge instruction given patient and wife verbalize understanding of discharge instructions given, My Chart not access at this time will access at home, patient in stable condition at this time

## 2013-01-28 NOTE — Progress Notes (Signed)
The patient's nurse called me this afternoon , stating that the patient requested discharge home.  stated the patient had had bowel movements, was tolerating a diet, was not having any nausea, no significant pain.  The patient is discharged home today with detailed instructions.  No new prescriptions for written  The patient will followup with me in the office in 2 weeks.   Angelia Mould. Derrell Lolling, M.D., [facility] General and Minimally invasive Surgery Breast and Colorectal Surgery Office:   [phone] Pager:   [phone]

## 2013-01-28 NOTE — Discharge Summary (Signed)
Patient ID: Stephen Roberts [id number] 47 y.o. [birth year]  Admit date: 01/25/2013  Discharge date and time: 01/28/2013  4:33 PM  Admitting Physician: Ernestene Mention  Discharge Physician: Ernestene Mention  Admission Diagnoses: Abdominal  pain, other specified site [789.09] Ileus, postoperative [997.49, 560.1]  Discharge Diagnoses: Abdominal pain secondary to postop ileus  Operations: none  Admission Condition: fair  Discharged Condition: good  Indication for Admission: Patient is admitted 2 days status post laparoscopic repair of primary ventral hernias. He was discharged yesterday. The patient called the office earlier today complaining of abdominal pressure and constipation. He took recommended laxatives and has had increased abdominal pain. He called back to the ED complaining of severe abdominal pain and inability to have a bowel movement and I asked him to come to the emergency department for evaluation. He describes quite severe generalized abdominal pain which has been worse when he tries to eat anything. He described as pressure-like pain. It is currently better after medications in the emergency department. He has felt like he has been constipated and has not had a bowel movement in 4 days. No nausea or vomiting. No fever or chills.Examination revealed a slightly distended, tender but soft abdomen.Laparoscopic wounds all looked good. No peritoneal signs.Lab work was essentially unremarkable.Abdominal x-rays suggest slight transit an adynamic ileus. Chest x-ray clear.   Hospital Course: The patient was admitted and started on IV fluid hydration, bowel rest, and narcotic analgesics. He improved over the next 48 or superior in the morning of September 15 he was feeling much better. In passing a lot of flatus but no stool. Was hungry and his abdominal exam was unremarkable. We placed him on a full liquid diet and prune juice and he began having bowel movements. The nursing staff  called me back in the afternoon stating that the patient was feeling well, was tolerating diet and was having good bowel movements and wanted to go home. The patient was discharged at that point. Diet and activities had previously been discussed. He was asked to return to see me in 2 weeks.  Consults: None  Significant Diagnostic Studies: Lab work and abdominal x-rays  Treatments: IV hydration  Disposition: Home  Patient Instructions:    Medication List         docusate sodium 100 MG capsule  Commonly known as:  COLACE  Take 100 mg by mouth 2 (two) times daily as needed for constipation.     HYDROcodone-acetaminophen 5-325 MG per tablet  Commonly known as:  NORCO/VICODIN  Take 1 tablet by mouth every 6 (six) hours as needed for pain.     ibuprofen 200 MG tablet  Commonly known as:  ADVIL,MOTRIN  Take 400 mg by mouth every 6 (six) hours as needed for pain.     magnesium hydroxide 400 MG/5ML suspension  Commonly known as:  MILK OF MAGNESIA  Take 30 mLs by mouth 2 (two) times daily as needed for constipation.     oxyCODONE-acetaminophen 7.5-325 MG per tablet  Commonly known as:  PERCOCET  Take 1 tablet by mouth every 4 (four) hours as needed for pain.     polyethylene glycol packet  Commonly known as:  MIRALAX / GLYCOLAX  Take 17 g by mouth 2 (two) times daily as needed (constipation).        Activity: activity as tolerated. No sports or heavy lifting. Diet: low fat, low cholesterol diet Wound Care: none needed  Follow-up:  With Dr. Derrell Lolling in 2 weeks.  Signed: Angelia Mould. Derrell Lolling,  M.D., FACS General and minimally invasive surgery Breast and Colorectal Surgery  01/28/2013, 5:21 PM

## 2013-02-12 ENCOUNTER — Encounter (INDEPENDENT_AMBULATORY_CARE_PROVIDER_SITE_OTHER): Payer: Self-pay | Admitting: General Surgery

## 2013-02-12 ENCOUNTER — Encounter (INDEPENDENT_AMBULATORY_CARE_PROVIDER_SITE_OTHER): Payer: Self-pay

## 2013-02-12 ENCOUNTER — Ambulatory Visit (INDEPENDENT_AMBULATORY_CARE_PROVIDER_SITE_OTHER): Payer: Worker's Compensation | Admitting: General Surgery

## 2013-02-12 VITALS — BP 120/72 | HR 70 | Resp 16 | Ht 69.0 in | Wt 217.8 lb

## 2013-02-12 DIAGNOSIS — K436 Other and unspecified ventral hernia with obstruction, without gangrene: Secondary | ICD-10-CM

## 2013-02-12 DIAGNOSIS — K420 Umbilical hernia with obstruction, without gangrene: Secondary | ICD-10-CM

## 2013-02-12 NOTE — Progress Notes (Signed)
Patient ID: Stephen Roberts, male   DOB: 10-31-1965, 47 y.o.   MRN: [id number] History: This gentleman underwent laparoscopic repair of multiple midline hernias with a 20 cm diameter mesh was 01/23/2013. He has had no problems but is still sore. He says the pain is getting better. He still wears a binder. He can ambulate unlimited amount but is sore when he bends over. Appetite normal. Normal bowel function. Normal bladder function. He works as a Psychologist, occupational. He continues to smoke.  Exam: Patient looks well. Friendly. No distress. Abdomen soft. All suture fixation sites and trocar sites healing normally. Minimal seroma in hernia sac. No signs of infection. Repair intact  Assessment: Multiple ventral incarcerated hernias, recovering uneventfully following laparoscopic repair with 20 cm mesh Tobacco abuse  Plan: Diet and activities discussed Return to work without restriction 3 weeks from now Return to see me if further problems arise.   Angelia Mould. Derrell Lolling, M.D., [facility] General and Minimally invasive Surgery Breast and Colorectal Surgery Office:   [phone] Pager:   [phone]

## 2013-02-12 NOTE — Patient Instructions (Signed)
You are recovering from your laparoscopic ventral hernia repair without any obvious complications.  You may return to normal activities in 3 weeks from now.  You will  still be somewhat sore for about 3 months.  Return to see Dr. Derrell Lolling if further problems arise.

## 2013-03-21 ENCOUNTER — Other Ambulatory Visit: Payer: Self-pay

## 2014-07-10 ENCOUNTER — Encounter: Payer: Self-pay | Admitting: Family Medicine

## 2014-07-28 ENCOUNTER — Ambulatory Visit: Payer: 59 | Admitting: Physician Assistant

## 2014-07-30 ENCOUNTER — Ambulatory Visit (INDEPENDENT_AMBULATORY_CARE_PROVIDER_SITE_OTHER): Payer: 59 | Admitting: Physician Assistant

## 2014-07-30 ENCOUNTER — Encounter: Payer: Self-pay | Admitting: Physician Assistant

## 2014-07-30 VITALS — BP 132/96 | HR 76 | Temp 98.6°F | Resp 18 | Ht 67.5 in | Wt 229.0 lb

## 2014-07-30 DIAGNOSIS — L239 Allergic contact dermatitis, unspecified cause: Secondary | ICD-10-CM | POA: Insufficient documentation

## 2014-07-30 DIAGNOSIS — Z125 Encounter for screening for malignant neoplasm of prostate: Secondary | ICD-10-CM

## 2014-07-30 DIAGNOSIS — F17200 Nicotine dependence, unspecified, uncomplicated: Secondary | ICD-10-CM

## 2014-07-30 DIAGNOSIS — Z0000 Encounter for general adult medical examination without abnormal findings: Secondary | ICD-10-CM | POA: Diagnosis not present

## 2014-07-30 DIAGNOSIS — Z1212 Encounter for screening for malignant neoplasm of rectum: Secondary | ICD-10-CM | POA: Diagnosis not present

## 2014-07-30 DIAGNOSIS — M25551 Pain in right hip: Secondary | ICD-10-CM | POA: Diagnosis not present

## 2014-07-30 DIAGNOSIS — Z720 Tobacco use: Secondary | ICD-10-CM

## 2014-07-30 DIAGNOSIS — Z1211 Encounter for screening for malignant neoplasm of colon: Secondary | ICD-10-CM | POA: Diagnosis not present

## 2014-07-30 DIAGNOSIS — N529 Male erectile dysfunction, unspecified: Secondary | ICD-10-CM

## 2014-07-30 DIAGNOSIS — J3089 Other allergic rhinitis: Secondary | ICD-10-CM | POA: Diagnosis not present

## 2014-07-30 DIAGNOSIS — Z23 Encounter for immunization: Secondary | ICD-10-CM | POA: Diagnosis not present

## 2014-07-30 DIAGNOSIS — L200 Besnier's prurigo: Secondary | ICD-10-CM

## 2014-07-30 DIAGNOSIS — R6882 Decreased libido: Secondary | ICD-10-CM | POA: Diagnosis not present

## 2014-07-30 DIAGNOSIS — R5382 Chronic fatigue, unspecified: Secondary | ICD-10-CM

## 2014-07-30 DIAGNOSIS — G8929 Other chronic pain: Secondary | ICD-10-CM

## 2014-07-30 DIAGNOSIS — J309 Allergic rhinitis, unspecified: Secondary | ICD-10-CM | POA: Insufficient documentation

## 2014-07-30 MED ORDER — HYDROCODONE-ACETAMINOPHEN 5-325 MG PO TABS: 1.0000 | ORAL_TABLET | Freq: Four times a day (QID) | ORAL | Status: DC | PRN
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
## 2014-08-04 ENCOUNTER — Other Ambulatory Visit: Payer: 59

## 2014-08-04 DIAGNOSIS — R6882 Decreased libido: Secondary | ICD-10-CM

## 2014-08-04 DIAGNOSIS — N529 Male erectile dysfunction, unspecified: Secondary | ICD-10-CM

## 2014-08-04 DIAGNOSIS — Z125 Encounter for screening for malignant neoplasm of prostate: Secondary | ICD-10-CM

## 2014-08-04 DIAGNOSIS — Z0000 Encounter for general adult medical examination without abnormal findings: Secondary | ICD-10-CM

## 2014-08-04 DIAGNOSIS — R5382 Chronic fatigue, unspecified: Secondary | ICD-10-CM

## 2014-08-04 LAB — COMPLETE METABOLIC PANEL WITH GFR
ALT: 23 U/L (ref 0–53)
AST: 17 U/L (ref 0–37)
Albumin: 4.1 g/dL (ref 3.5–5.2)
Alkaline Phosphatase: 109 U/L (ref 39–117)
BILIRUBIN TOTAL: 0.3 mg/dL (ref 0.2–1.2)
BUN: 17 mg/dL (ref 6–23)
CALCIUM: 9.4 mg/dL (ref 8.4–10.5)
CHLORIDE: 105 meq/L (ref 96–112)
CO2: 24 mEq/L (ref 19–32)
CREATININE: 1.03 mg/dL (ref 0.50–1.35)
GFR, Est African American: >89 mL/min
GFR, Est Non African American: 84 mL/min
Glucose, Bld: 101 mg/dL — ABNORMAL HIGH (ref 70–99)
Potassium: 4.9 mEq/L (ref 3.5–5.3)
Sodium: 141 mEq/L (ref 135–145)
Total Protein: 7.2 g/dL (ref 6.0–8.3)

## 2014-08-04 LAB — CBC WITH DIFFERENTIAL/PLATELET
Basophils Absolute: 0 10*3/uL (ref 0.0–0.1)
Basophils Relative: 0 % (ref 0–1)
Eosinophils Absolute: 0.2 10*3/uL (ref 0.0–0.7)
Eosinophils Relative: 3 % (ref 0–5)
HEMATOCRIT: 46.7 % (ref 39.0–52.0)
HEMOGLOBIN: 15.5 g/dL (ref 13.0–17.0)
LYMPHS ABS: 2.2 10*3/uL (ref 0.7–4.0)
LYMPHS PCT: 32 % (ref 12–46)
MCH: 32.5 pg (ref 26.0–34.0)
MCHC: 33.2 g/dL (ref 30.0–36.0)
MCV: 97.9 fL (ref 78.0–100.0)
MONOS PCT: 7 % (ref 3–12)
MPV: 11.7 fL (ref 8.6–12.4)
Monocytes Absolute: 0.5 10*3/uL (ref 0.1–1.0)
NEUTROS ABS: 4.1 10*3/uL (ref 1.7–7.7)
NEUTROS PCT: 58 % (ref 43–77)
Platelets: 204 10*3/uL (ref 150–400)
RBC: 4.77 MIL/uL (ref 4.22–5.81)
RDW: 13.9 % (ref 11.5–15.5)
WBC: 7 10*3/uL (ref 4.0–10.5)

## 2014-08-04 LAB — LIPID PANEL
CHOL/HDL RATIO: 3.6 ratio
Cholesterol: 165 mg/dL (ref 0–200)
HDL: 46 mg/dL (ref >=40)
LDL CALC: 96 mg/dL (ref 0–99)
Triglycerides: 113 mg/dL (ref <150)
VLDL: 23 mg/dL (ref 0–40)

## 2014-08-04 LAB — TSH: TSH: 1.493 u[IU]/mL (ref 0.350–4.500)

## 2014-08-05 LAB — TESTOSTERONE: Testosterone: 567 ng/dL (ref 300–890)

## 2014-08-05 LAB — PSA: PSA: 1.36 ng/mL (ref <=4.00)

## 2014-08-07 ENCOUNTER — Telehealth: Payer: Self-pay | Admitting: Physician Assistant

## 2014-08-07 NOTE — Telephone Encounter (Signed)
[phone] PT is calling about lab results

## 2014-08-08 MED ORDER — SILDENAFIL CITRATE 100 MG PO TABS: ORAL_TABLET | ORAL | Status: DC

## 2014-08-08 MED ORDER — ZOLPIDEM TARTRATE 10 MG PO TABS: 10.0000 mg | ORAL_TABLET | Freq: Every evening | ORAL | Status: DC | PRN

## 2014-08-08 NOTE — Telephone Encounter (Signed)
Pt aware of lab results and provider recommendations.  Rx's to pharmacy

## 2014-08-08 NOTE — Telephone Encounter (Signed)
-----   Message from Orlena Sheldon, PA-C sent at 08/06/2014  8:04 AM EDT ----- (FYI--This is a Male--name Magda Paganini) Tell patient that labs are normal. --Regarding his insomnia and fatigue tell him that I recommend he take medication to help him sleep better. ------ Ambien 10 mg 1 by mouth 30 minutes prior to sleep #30+2 refills  --Regarding his erectile dysfunction labs were normal so I recommend he try Viagra -------- Viagra 100 mg 1 by mouth 30 minutes prior to need #4 +5 refills  --- Tell patient that I have completed his form for him to turn into insurance and we will mail this to him in the self addressed envelope he gave [place]

## 2014-09-03 ENCOUNTER — Telehealth: Payer: Self-pay | Admitting: *Deleted

## 2014-09-03 NOTE — Telephone Encounter (Signed)
Pt called stating that he has a rash on elbow and itching really,really bad and needs some sort of medication to help relief it, he says he has an appt with allergist Dr. Annamaria Boots in late June but wants to know if you can do something here in office like a shot of steroids or something to help him until he seen. Please advise!

## 2014-09-03 NOTE — Telephone Encounter (Signed)
I reviewed my office visit note 07/30/14. Can send in a oral prednisone taper for him to take. Prednisone 20 mg Take 3 daily for 2 days then Take 2 daily for 2 days then Take 1 daily for 2 days  #12 with 0 refills

## 2014-09-05 MED ORDER — PREDNISONE 20 MG PO TABS: ORAL_TABLET | ORAL | Status: DC

## 2014-09-05 NOTE — Telephone Encounter (Signed)
Called pt on his cell phone.  Aware of RX.

## 2014-09-08 ENCOUNTER — Telehealth: Payer: Self-pay | Admitting: Family Medicine

## 2014-09-08 NOTE — Telephone Encounter (Signed)
Pt requesting RX for Sildenafil 20 mg (generic Viagra)

## 2014-09-10 NOTE — Telephone Encounter (Signed)
PA has been submitted through [employer] via "CoverMyMeds"  Case [id number]

## 2014-09-10 NOTE — Telephone Encounter (Signed)
Pt called back.  Clarified new RX was for 20 mg Sildenafil and not the 100 mg Viagra he is using now.  Pt said that was fine, does not need 100 mg maybe only uses 2 - 20 mg tabs as needed.  His [facility] insurance will cover 270 tablets in a 68 day period.  Pt says he will never use that much.  Need to call number on paper he drop off at office. [phone]

## 2014-09-17 NOTE — Telephone Encounter (Signed)
Patient calling back regarding the issue with his viagra [phone] (H)

## 2014-09-18 NOTE — Telephone Encounter (Signed)
Still waiting on response from [employer].  Pt made aware.  Have call into them again

## 2014-09-22 ENCOUNTER — Telehealth: Payer: Self-pay | Admitting: Physician Assistant

## 2014-09-22 NOTE — Telephone Encounter (Signed)
[phone] PT came by office to speak to you before he had to go into work but you were in room with another pt, however mr Morace is wanting to speak to you in regards to a medication

## 2014-09-22 NOTE — Telephone Encounter (Signed)
Has placed PA for Sildenafil (Viagra).  I has been received denied.  Pt has been made aware.  Reason non qualifying diagnosis.  [employer] [facility] Case ID [id number].

## 2014-09-22 NOTE — Telephone Encounter (Signed)
See phone note from 09/22/14

## 2014-11-03 ENCOUNTER — Ambulatory Visit: Payer: 59 | Admitting: Physician Assistant

## 2014-11-10 ENCOUNTER — Ambulatory Visit (INDEPENDENT_AMBULATORY_CARE_PROVIDER_SITE_OTHER): Payer: 59 | Admitting: Physician Assistant

## 2014-11-10 ENCOUNTER — Encounter: Payer: Self-pay | Admitting: Physician Assistant

## 2014-11-10 VITALS — BP 130/78 | HR 74 | Temp 98.9°F | Resp 18 | Wt 233.0 lb

## 2014-11-10 DIAGNOSIS — G894 Chronic pain syndrome: Secondary | ICD-10-CM | POA: Diagnosis not present

## 2014-11-10 MED ORDER — HYDROCODONE-ACETAMINOPHEN 5-325 MG PO TABS: 1.0000 | ORAL_TABLET | Freq: Four times a day (QID) | ORAL | Status: DC | PRN

## 2014-11-10 MED ORDER — OXYCODONE-ACETAMINOPHEN 5-325 MG PO TABS: 1.0000 | ORAL_TABLET | Freq: Three times a day (TID) | ORAL | Status: DC | PRN
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
## 2015-01-09 ENCOUNTER — Telehealth: Payer: Self-pay | Admitting: Family Medicine

## 2015-01-09 NOTE — Telephone Encounter (Signed)
NTBS.

## 2015-01-09 NOTE — Telephone Encounter (Signed)
Pt called told NTBS.  Recommended Urgent Care or [facility].

## 2015-01-09 NOTE — Telephone Encounter (Signed)
Problem with sinus/chest congestion all week.  Terrible frontal headache. Yellow greenish nasal secretions.  Has tried multiple OTC's w/o help.  Can we call something in for him?

## 2015-01-14 ENCOUNTER — Ambulatory Visit: Payer: 59 | Admitting: Physician Assistant

## 2015-04-21 ENCOUNTER — Other Ambulatory Visit: Payer: Self-pay | Admitting: Physician Assistant

## 2015-04-21 DIAGNOSIS — G894 Chronic pain syndrome: Secondary | ICD-10-CM

## 2015-04-21 NOTE — Telephone Encounter (Signed)
Patient calling requesting refill on his HYDROcodone-acetaminophen (NORCO/VICODIN) 5-325 MG and oxyCODONE-acetaminophen (ROXICET) 5-325 MG [phone]

## 2015-04-21 NOTE — Telephone Encounter (Signed)
LRF 11/10/14 on both #30 each.  LOV 11/10/14.  No Show appt in August.  OK refill?

## 2015-04-21 NOTE — Telephone Encounter (Signed)
I reviewed records in [place]. Consistent with history pt reported to me. I see Dr. Towanda Malkin name on things in 2003.  CT Neck 2003 showed major abnormalities. Patient's use of meds c/w what he reported to me at [place].  Can refill both meds for same quantity as on last fill. + 0.

## 2015-04-22 MED ORDER — HYDROCODONE-ACETAMINOPHEN 5-325 MG PO TABS: 1.0000 | ORAL_TABLET | Freq: Four times a day (QID) | ORAL | Status: DC | PRN

## 2015-04-22 MED ORDER — OXYCODONE-ACETAMINOPHEN 5-325 MG PO TABS: 1.0000 | ORAL_TABLET | Freq: Three times a day (TID) | ORAL | Status: DC | PRN

## 2015-04-22 NOTE — Telephone Encounter (Signed)
rx's printed 

## 2015-04-22 NOTE — Telephone Encounter (Signed)
Pt aware rx ready

## 2015-08-12 ENCOUNTER — Encounter: Payer: Self-pay | Admitting: Physician Assistant

## 2015-08-12 ENCOUNTER — Ambulatory Visit (INDEPENDENT_AMBULATORY_CARE_PROVIDER_SITE_OTHER): Payer: 59 | Admitting: Physician Assistant

## 2015-08-12 VITALS — BP 126/84 | HR 72 | Temp 98.0°F | Resp 18 | Ht 69.0 in | Wt 235.0 lb

## 2015-08-12 DIAGNOSIS — Z0000 Encounter for general adult medical examination without abnormal findings: Secondary | ICD-10-CM

## 2015-08-12 DIAGNOSIS — G894 Chronic pain syndrome: Secondary | ICD-10-CM | POA: Diagnosis not present

## 2015-08-12 LAB — CBC WITH DIFFERENTIAL/PLATELET
BASOS PCT: 1 % (ref 0–1)
Basophils Absolute: 0.1 10*3/uL (ref 0.0–0.1)
Eosinophils Absolute: 0.2 10*3/uL (ref 0.0–0.7)
Eosinophils Relative: 3 % (ref 0–5)
HEMATOCRIT: 44.5 % (ref 39.0–52.0)
HEMOGLOBIN: 15.3 g/dL (ref 13.0–17.0)
Lymphocytes Relative: 30 % (ref 12–46)
Lymphs Abs: 1.7 10*3/uL (ref 0.7–4.0)
MCH: 33.9 pg (ref 26.0–34.0)
MCHC: 34.4 g/dL (ref 30.0–36.0)
MCV: 98.7 fL (ref 78.0–100.0)
MONO ABS: 0.6 10*3/uL (ref 0.1–1.0)
MONOS PCT: 10 % (ref 3–12)
MPV: 11.8 fL (ref 8.6–12.4)
NEUTROS ABS: 3.1 10*3/uL (ref 1.7–7.7)
Neutrophils Relative %: 56 % (ref 43–77)
Platelets: 172 10*3/uL (ref 150–400)
RBC: 4.51 MIL/uL (ref 4.22–5.81)
RDW: 14.1 % (ref 11.5–15.5)
WBC: 5.5 10*3/uL (ref 4.0–10.5)

## 2015-08-12 LAB — COMPLETE METABOLIC PANEL WITH GFR
ALBUMIN: 4 g/dL (ref 3.6–5.1)
ALK PHOS: 98 U/L (ref 40–115)
ALT: 21 U/L (ref 9–46)
AST: 19 U/L (ref 10–35)
BUN: 15 mg/dL (ref 7–25)
CALCIUM: 9.2 mg/dL (ref 8.6–10.3)
CO2: 22 mmol/L (ref 20–31)
Chloride: 106 mmol/L (ref 98–110)
Creat: 0.75 mg/dL (ref 0.70–1.33)
Glucose, Bld: 84 mg/dL (ref 70–99)
POTASSIUM: 4.4 mmol/L (ref 3.5–5.3)
SODIUM: 138 mmol/L (ref 135–146)
Total Bilirubin: 0.3 mg/dL (ref 0.2–1.2)
Total Protein: 6.8 g/dL (ref 6.1–8.1)

## 2015-08-12 LAB — LIPID PANEL
CHOL/HDL RATIO: 4 ratio (ref <=5.0)
CHOLESTEROL: 163 mg/dL (ref 125–200)
HDL: 41 mg/dL (ref >=40)
LDL Cholesterol: 110 mg/dL (ref <130)
TRIGLYCERIDES: 62 mg/dL (ref <150)
VLDL: 12 mg/dL (ref <30)

## 2015-08-12 LAB — TSH: TSH: 1.59 mIU/L (ref 0.40–4.50)

## 2015-08-12 MED ORDER — HYDROCODONE-ACETAMINOPHEN 5-325 MG PO TABS
1.0000 | ORAL_TABLET | Freq: Four times a day (QID) | ORAL | Status: DC | PRN
Start: 2015-08-12 — End: 2015-11-30
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
## 2015-08-13 ENCOUNTER — Encounter: Payer: Self-pay | Admitting: Family Medicine

## 2015-08-13 LAB — PSA: PSA: 1.99 ng/mL (ref <=4.00)

## 2015-08-13 LAB — VITAMIN D 25 HYDROXY (VIT D DEFICIENCY, FRACTURES): Vit D, 25-Hydroxy: 33 ng/mL (ref 30–100)

## 2015-11-30 ENCOUNTER — Other Ambulatory Visit: Payer: Self-pay | Admitting: Physician Assistant

## 2015-11-30 DIAGNOSIS — G894 Chronic pain syndrome: Secondary | ICD-10-CM

## 2015-11-30 NOTE — Telephone Encounter (Signed)
LRF Hydro 08/12/15 #60  LRF Oxy 04/21/16 #30  LOV 08/12/15  OK refill?

## 2015-11-30 NOTE — Telephone Encounter (Signed)
Pt is requesting a refill of Oxycodone 5-325 and Hydrocodone 5-325. [phone]

## 2015-11-30 NOTE — Telephone Encounter (Signed)
Approved.  hydrocodone  #60 + 0 Oxycodone  # 30 + 0

## 2015-12-01 MED ORDER — OXYCODONE-ACETAMINOPHEN 5-325 MG PO TABS: 1.0000 | ORAL_TABLET | Freq: Three times a day (TID) | ORAL | Status: DC | PRN

## 2015-12-01 MED ORDER — HYDROCODONE-ACETAMINOPHEN 5-325 MG PO TABS: 1.0000 | ORAL_TABLET | Freq: Four times a day (QID) | ORAL | Status: DC | PRN

## 2015-12-01 NOTE — Telephone Encounter (Signed)
Rx is printed and pt told will be ready for pick up anytime after 8AM tomorrow

## 2015-12-14 ENCOUNTER — Telehealth: Payer: Self-pay | Admitting: Physician Assistant

## 2015-12-14 NOTE — Telephone Encounter (Signed)
Patient asking for refill on viagra  [phone] [facility] [place]

## 2015-12-15 MED ORDER — SILDENAFIL CITRATE 100 MG PO TABS: ORAL_TABLET | ORAL | 5 refills | Status: DC

## 2015-12-15 NOTE — Telephone Encounter (Signed)
Medication refilled per protocol. 

## 2015-12-16 ENCOUNTER — Telehealth: Payer: Self-pay | Admitting: Physician Assistant

## 2015-12-16 NOTE — Telephone Encounter (Signed)
Patient called requesting a refill on his Viagra  I informed him that it was called in yesterday at 12:45 by Maudie Mercury he states [facility] didn't get a call. Please check on refill.  CB# [phone]

## 2015-12-17 MED ORDER — SILDENAFIL CITRATE 100 MG PO TABS: ORAL_TABLET | ORAL | 5 refills | Status: DC

## 2015-12-17 NOTE — Telephone Encounter (Signed)
Pt said pharmacy never got refill of his Viagra.  Refill was called in.  Pt aware

## 2016-07-08 ENCOUNTER — Other Ambulatory Visit: Payer: Self-pay | Admitting: Physician Assistant

## 2016-07-08 DIAGNOSIS — G894 Chronic pain syndrome: Secondary | ICD-10-CM

## 2016-07-08 NOTE — Telephone Encounter (Signed)
Last OV 08-12-2015 Last refill 12-01-2015 Ok to refill?

## 2016-07-08 NOTE — Telephone Encounter (Signed)
Pt wants to know if he can get a refill on oxycodone and hydrocodone.

## 2016-07-11 NOTE — Telephone Encounter (Signed)
Needs OV.  

## 2016-07-11 NOTE — Telephone Encounter (Signed)
Pt states it cost him when he comes in for an office visit and will wait until it is time for his yearly to get a refill

## 2016-08-02 DIAGNOSIS — M1711 Unilateral primary osteoarthritis, right knee: Secondary | ICD-10-CM | POA: Diagnosis not present

## 2016-08-03 ENCOUNTER — Ambulatory Visit: Payer: 59 | Admitting: Physician Assistant

## 2016-08-29 ENCOUNTER — Other Ambulatory Visit: Payer: 59

## 2016-08-31 ENCOUNTER — Other Ambulatory Visit: Payer: Self-pay

## 2016-08-31 ENCOUNTER — Other Ambulatory Visit: Payer: Self-pay | Admitting: Family Medicine

## 2016-08-31 ENCOUNTER — Other Ambulatory Visit: Payer: Self-pay | Admitting: Physician Assistant

## 2016-08-31 ENCOUNTER — Telehealth: Payer: Self-pay | Admitting: Physician Assistant

## 2016-08-31 ENCOUNTER — Other Ambulatory Visit: Payer: 59

## 2016-08-31 DIAGNOSIS — Z79899 Other long term (current) drug therapy: Secondary | ICD-10-CM

## 2016-08-31 DIAGNOSIS — F17200 Nicotine dependence, unspecified, uncomplicated: Secondary | ICD-10-CM

## 2016-08-31 DIAGNOSIS — Z0000 Encounter for general adult medical examination without abnormal findings: Secondary | ICD-10-CM

## 2016-08-31 DIAGNOSIS — Z125 Encounter for screening for malignant neoplasm of prostate: Secondary | ICD-10-CM

## 2016-08-31 DIAGNOSIS — E669 Obesity, unspecified: Secondary | ICD-10-CM

## 2016-08-31 DIAGNOSIS — G894 Chronic pain syndrome: Secondary | ICD-10-CM

## 2016-08-31 LAB — COMPLETE METABOLIC PANEL WITH GFR
ALBUMIN: 4.1 g/dL (ref 3.6–5.1)
ALT: 18 U/L (ref 9–46)
AST: 18 U/L (ref 10–35)
Alkaline Phosphatase: 123 U/L — ABNORMAL HIGH (ref 40–115)
BILIRUBIN TOTAL: 0.5 mg/dL (ref 0.2–1.2)
BUN: 14 mg/dL (ref 7–25)
CO2: 19 mmol/L — ABNORMAL LOW (ref 20–31)
CREATININE: 0.75 mg/dL (ref 0.70–1.33)
Calcium: 9.5 mg/dL (ref 8.6–10.3)
Chloride: 106 mmol/L (ref 98–110)
GFR, Est African American: >89 mL/min (ref >=60)
GFR, Est Non African American: >89 mL/min (ref >=60)
GLUCOSE: 101 mg/dL — AB (ref 70–99)
Potassium: 4.7 mmol/L (ref 3.5–5.3)
SODIUM: 139 mmol/L (ref 135–146)
TOTAL PROTEIN: 7 g/dL (ref 6.1–8.1)

## 2016-08-31 LAB — CBC WITH DIFFERENTIAL/PLATELET
BASOS ABS: 55 {cells}/uL (ref 0–200)
Basophils Relative: 1 %
EOS PCT: 4 %
Eosinophils Absolute: 220 cells/uL (ref 15–500)
HEMATOCRIT: 44.3 % (ref 38.5–50.0)
HEMOGLOBIN: 14.6 g/dL (ref 13.0–17.0)
LYMPHS ABS: 1925 {cells}/uL (ref 850–3900)
Lymphocytes Relative: 35 %
MCH: 31.5 pg (ref 27.0–33.0)
MCHC: 33 g/dL (ref 32.0–36.0)
MCV: 95.7 fL (ref 80.0–100.0)
MPV: 12.1 fL (ref 7.5–12.5)
Monocytes Absolute: 550 cells/uL (ref 200–950)
Monocytes Relative: 10 %
NEUTROS PCT: 50 %
Neutro Abs: 2750 cells/uL (ref 1500–7800)
Platelets: 207 10*3/uL (ref 140–400)
RBC: 4.63 MIL/uL (ref 4.20–5.80)
RDW: 15.1 % — ABNORMAL HIGH (ref 11.0–15.0)
WBC: 5.5 10*3/uL (ref 3.8–10.8)

## 2016-08-31 LAB — LIPID PANEL
Cholesterol: 183 mg/dL (ref <200)
HDL: 47 mg/dL (ref >40)
LDL CALC: 125 mg/dL — AB (ref <100)
Total CHOL/HDL Ratio: 3.9 Ratio (ref <5.0)
Triglycerides: 54 mg/dL (ref <150)
VLDL: 11 mg/dL (ref <30)

## 2016-08-31 LAB — TSH: TSH: 1.54 mIU/L (ref 0.40–4.50)

## 2016-08-31 NOTE — Telephone Encounter (Signed)
Pt needs pharmacy changed to [facility] in [place] [address]

## 2016-08-31 NOTE — Telephone Encounter (Signed)
Pharmacy has been changed  to [facility] [address] in [place]

## 2016-09-01 ENCOUNTER — Telehealth: Payer: Self-pay | Admitting: *Deleted

## 2016-09-01 ENCOUNTER — Encounter: Payer: Self-pay | Admitting: Physician Assistant

## 2016-09-01 ENCOUNTER — Ambulatory Visit (INDEPENDENT_AMBULATORY_CARE_PROVIDER_SITE_OTHER): Payer: 59 | Admitting: Physician Assistant

## 2016-09-01 VITALS — BP 126/84 | HR 94 | Temp 98.1°F | Resp 14 | Ht 69.0 in | Wt 230.4 lb

## 2016-09-01 DIAGNOSIS — Z0000 Encounter for general adult medical examination without abnormal findings: Secondary | ICD-10-CM

## 2016-09-01 LAB — URIC ACID: Uric Acid, Serum: 5.3 mg/dL (ref 4.0–8.0)

## 2016-09-01 LAB — PSA: PSA: 0.8 ng/mL (ref <=4.0)
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
## 2016-09-01 NOTE — Telephone Encounter (Signed)
He did not mention these meds to me at [place]. (If he did, I did not hear him).  At [place] he discussed that he had gone to [facility] about his knee and that since then, he has talked to family members, etc and thinks it is gout and I am checking lab to evaluate for gout.  Pain meds denied.

## 2016-09-01 NOTE — Telephone Encounter (Signed)
Received call from patient.   Reports that he requested refill on Hydrocodone and Oxycodone at [facility] this AM, but did not receive prescription.   Ok to print refill?

## 2016-09-02 ENCOUNTER — Other Ambulatory Visit: Payer: Self-pay | Admitting: *Deleted

## 2016-09-02 NOTE — Telephone Encounter (Signed)
Call placed to patient.   Discussed provider recommendations per last chart note.  Patient upset and verbally abrasive.   Patient requested refill on Sidenafil.   Please advise.

## 2016-09-02 NOTE — Telephone Encounter (Signed)
Spoke with patient and discussed why we could not give  rx for his pain med patient states he could not understand why and that he was going to find another doctor

## 2016-09-05 MED ORDER — SILDENAFIL CITRATE 100 MG PO TABS: ORAL_TABLET | ORAL | 5 refills | Status: DC

## 2016-09-05 NOTE — Telephone Encounter (Signed)
Refill Approved. Sildenafil 100mg  # 4 + 5 refills

## 2016-09-05 NOTE — Telephone Encounter (Signed)
Prescription sent to pharmacy.

## 2017-03-08 DIAGNOSIS — M542 Cervicalgia: Secondary | ICD-10-CM | POA: Diagnosis not present

## 2017-03-08 DIAGNOSIS — M4712 Other spondylosis with myelopathy, cervical region: Secondary | ICD-10-CM | POA: Diagnosis not present

## 2017-03-13 ENCOUNTER — Other Ambulatory Visit: Payer: Self-pay | Admitting: Physician Assistant

## 2017-03-13 NOTE — Telephone Encounter (Signed)
Refill appropriate 

## 2017-03-14 ENCOUNTER — Other Ambulatory Visit: Payer: Self-pay | Admitting: Orthopaedic Surgery

## 2017-03-14 DIAGNOSIS — M542 Cervicalgia: Secondary | ICD-10-CM

## 2017-04-10 ENCOUNTER — Other Ambulatory Visit: Payer: Self-pay | Admitting: Orthopaedic Surgery

## 2017-04-10 DIAGNOSIS — T1590XA Foreign body on external eye, part unspecified, unspecified eye, initial encounter: Secondary | ICD-10-CM

## 2017-04-17 ENCOUNTER — Ambulatory Visit: Payer: 59 | Admitting: Physician Assistant

## 2017-04-18 ENCOUNTER — Ambulatory Visit
Admission: RE | Admit: 2017-04-18 | Discharge: 2017-04-18 | Disposition: A | Discharge status: Home / Self Care | Payer: Self-pay | Source: Ambulatory Visit | Attending: Orthopaedic Surgery | Admitting: Orthopaedic Surgery

## 2017-04-18 ENCOUNTER — Ambulatory Visit
Admission: RE | Admit: 2017-04-18 | Discharge: 2017-04-18 | Disposition: A | Discharge status: Home / Self Care | Payer: 59 | Source: Ambulatory Visit | Attending: Orthopaedic Surgery | Admitting: Orthopaedic Surgery

## 2017-04-18 DIAGNOSIS — M542 Cervicalgia: Secondary | ICD-10-CM

## 2017-04-18 DIAGNOSIS — T1590XA Foreign body on external eye, part unspecified, unspecified eye, initial encounter: Secondary | ICD-10-CM

## 2017-04-18 DIAGNOSIS — Z135 Encounter for screening for eye and ear disorders: Secondary | ICD-10-CM | POA: Diagnosis not present

## 2017-04-18 DIAGNOSIS — M50221 Other cervical disc displacement at C4-C5 level: Secondary | ICD-10-CM | POA: Diagnosis not present

## 2017-04-26 DIAGNOSIS — M47892 Other spondylosis, cervical region: Secondary | ICD-10-CM | POA: Diagnosis not present

## 2017-04-26 DIAGNOSIS — M4322 Fusion of spine, cervical region: Secondary | ICD-10-CM | POA: Diagnosis not present

## 2017-04-26 DIAGNOSIS — M542 Cervicalgia: Secondary | ICD-10-CM | POA: Diagnosis not present

## 2017-04-28 ENCOUNTER — Observation Stay (HOSPITAL_COMMUNITY)
Admission: EM | Admit: 2017-04-28 | Discharge: 2017-04-29 | Disposition: A | Discharge status: Home / Self Care | Payer: 59 | Attending: Internal Medicine | Admitting: Internal Medicine

## 2017-04-28 ENCOUNTER — Observation Stay (HOSPITAL_BASED_OUTPATIENT_CLINIC_OR_DEPARTMENT_OTHER): Payer: 59

## 2017-04-28 ENCOUNTER — Encounter (HOSPITAL_COMMUNITY): Payer: Self-pay

## 2017-04-28 ENCOUNTER — Other Ambulatory Visit: Payer: Self-pay

## 2017-04-28 ENCOUNTER — Emergency Department (HOSPITAL_COMMUNITY): Payer: 59

## 2017-04-28 DIAGNOSIS — F17200 Nicotine dependence, unspecified, uncomplicated: Secondary | ICD-10-CM

## 2017-04-28 DIAGNOSIS — R42 Dizziness and giddiness: Secondary | ICD-10-CM | POA: Diagnosis not present

## 2017-04-28 DIAGNOSIS — I493 Ventricular premature depolarization: Secondary | ICD-10-CM | POA: Insufficient documentation

## 2017-04-28 DIAGNOSIS — R0602 Shortness of breath: Secondary | ICD-10-CM | POA: Diagnosis not present

## 2017-04-28 DIAGNOSIS — E669 Obesity, unspecified: Secondary | ICD-10-CM | POA: Diagnosis not present

## 2017-04-28 DIAGNOSIS — R0789 Other chest pain: Secondary | ICD-10-CM | POA: Diagnosis not present

## 2017-04-28 DIAGNOSIS — Z6835 Body mass index (BMI) 35.0-35.9, adult: Secondary | ICD-10-CM | POA: Insufficient documentation

## 2017-04-28 DIAGNOSIS — F17210 Nicotine dependence, cigarettes, uncomplicated: Secondary | ICD-10-CM | POA: Insufficient documentation

## 2017-04-28 DIAGNOSIS — R0600 Dyspnea, unspecified: Secondary | ICD-10-CM | POA: Diagnosis not present

## 2017-04-28 DIAGNOSIS — G894 Chronic pain syndrome: Secondary | ICD-10-CM | POA: Diagnosis not present

## 2017-04-28 DIAGNOSIS — R079 Chest pain, unspecified: Secondary | ICD-10-CM | POA: Insufficient documentation

## 2017-04-28 DIAGNOSIS — Z720 Tobacco use: Secondary | ICD-10-CM | POA: Diagnosis present

## 2017-04-28 DIAGNOSIS — Z79899 Other long term (current) drug therapy: Secondary | ICD-10-CM | POA: Insufficient documentation

## 2017-04-28 LAB — ECHOCARDIOGRAM COMPLETE
E decel time: 183 ms
E/e' ratio: 5.66
FS: 45 % — AB (ref 28–44)
Height: 70 in
IVS/LV PW RATIO, ED: 1.03
LA ID, A-P, ES: 39 mm
LA diam end sys: 39 mm
LA diam index: 1.69 cm/m2
LA vol A4C: 81.6 mL
LA vol index: 26.9 mL/m2
LA vol: 61.9 mL
LV E/e' medial: 5.66
LV E/e'average: 5.66
LV PW d: 11.5 mm — AB (ref 0.6–1.1)
LV dias vol index: 42 mL/m2
LV dias vol: 97 mL (ref 62–150)
LV e' LATERAL: 11.2 cm/s
LV sys vol index: 16 mL/m2
LV sys vol: 38 mL
LVOT SV: 66 mL
LVOT VTI: 19 cm
LVOT area: 3.46 cm2
LVOT diameter: 21 mm
LVOT peak grad rest: 3 mmHg
LVOT peak vel: 93.5 cm/s
Lateral S' vel: 14 cm/s
MV Dec: 183
MV pk A vel: 65.5 m/s
MV pk E vel: 63.4 m/s
Simpson's disk: 61
Stroke v: 60 mL
TAPSE: 23.9 mm
TDI e' lateral: 11.2
TDI e' medial: 7.4
Weight: 3911.84 [oz_av]

## 2017-04-28 LAB — CBC WITH DIFFERENTIAL/PLATELET
BASOS PCT: 1 %
Basophils Absolute: 0 10*3/uL (ref 0.0–0.1)
EOS ABS: 0.3 10*3/uL (ref 0.0–0.7)
EOS PCT: 4 %
HCT: 41.9 % (ref 39.0–52.0)
HEMOGLOBIN: 13.4 g/dL (ref 13.0–17.0)
Lymphocytes Relative: 30 %
Lymphs Abs: 1.9 10*3/uL (ref 0.7–4.0)
MCH: 31 pg (ref 26.0–34.0)
MCHC: 32 g/dL (ref 30.0–36.0)
MCV: 97 fL (ref 78.0–100.0)
MONO ABS: 0.7 10*3/uL (ref 0.1–1.0)
MONOS PCT: 11 %
NEUTROS PCT: 54 %
Neutro Abs: 3.4 10*3/uL (ref 1.7–7.7)
PLATELETS: 173 10*3/uL (ref 150–400)
RBC: 4.32 MIL/uL (ref 4.22–5.81)
RDW: 14.3 % (ref 11.5–15.5)
WBC: 6.4 10*3/uL (ref 4.0–10.5)

## 2017-04-28 LAB — COMPREHENSIVE METABOLIC PANEL WITH GFR
ALT: 26 U/L (ref 17–63)
AST: 26 U/L (ref 15–41)
Albumin: 3.8 g/dL (ref 3.5–5.0)
Alkaline Phosphatase: 122 U/L (ref 38–126)
Anion gap: 6 (ref 5–15)
BUN: 19 mg/dL (ref 6–20)
CO2: 27 mmol/L (ref 22–32)
Calcium: 8.8 mg/dL — ABNORMAL LOW (ref 8.9–10.3)
Chloride: 103 mmol/L (ref 101–111)
Creatinine, Ser: 0.91 mg/dL (ref 0.61–1.24)
GFR calc Af Amer: >60 mL/min
GFR calc non Af Amer: >60 mL/min
Glucose, Bld: 195 mg/dL — ABNORMAL HIGH (ref 65–99)
Potassium: 4 mmol/L (ref 3.5–5.1)
Sodium: 136 mmol/L (ref 135–145)
Total Bilirubin: 0.3 mg/dL (ref 0.3–1.2)
Total Protein: 7.1 g/dL (ref 6.5–8.1)

## 2017-04-28 LAB — D-DIMER, QUANTITATIVE: D-Dimer, Quant: 0.44 ug{FEU}/mL (ref 0.00–0.50)

## 2017-04-28 LAB — MAGNESIUM: Magnesium: 2 mg/dL (ref 1.7–2.4)

## 2017-04-28 LAB — TROPONIN I
Troponin I: <0.03 ng/mL (ref <0.03)
Troponin I: <0.03 ng/mL (ref <0.03)

## 2017-04-28 LAB — TSH: TSH: 2.228 u[IU]/mL (ref 0.350–4.500)

## 2017-04-28 LAB — BRAIN NATRIURETIC PEPTIDE: B Natriuretic Peptide: 11 pg/mL (ref 0.0–100.0)

## 2017-04-28 MED ORDER — ENOXAPARIN SODIUM 40 MG/0.4ML ~~LOC~~ SOLN
40.0000 mg | SUBCUTANEOUS | Status: DC
  Filled 2017-04-28: qty 0.4

## 2017-04-28 MED ORDER — KETOROLAC TROMETHAMINE 30 MG/ML IJ SOLN
30.0000 mg | Freq: Four times a day (QID) | INTRAMUSCULAR | Status: DC
  Administered 2017-04-29 (×3): 30 mg via INTRAVENOUS
  Filled 2017-04-28 (×3): qty 1

## 2017-04-28 MED ORDER — SODIUM CHLORIDE 0.9 % IV SOLN
INTRAVENOUS | Status: DC
  Administered 2017-04-28 – 2017-04-29 (×2): via INTRAVENOUS

## 2017-04-28 MED ORDER — PANTOPRAZOLE SODIUM 40 MG PO TBEC
40.0000 mg | DELAYED_RELEASE_TABLET | Freq: Every day | ORAL | Status: DC
  Administered 2017-04-28 – 2017-04-29 (×2): 40 mg via ORAL
  Filled 2017-04-28 (×2): qty 1

## 2017-04-28 MED ORDER — ONDANSETRON HCL 4 MG/2ML IJ SOLN: 4.0000 mg | Freq: Four times a day (QID) | INTRAMUSCULAR | Status: DC | PRN

## 2017-04-28 MED ORDER — ACETAMINOPHEN 325 MG PO TABS: 650.0000 mg | ORAL_TABLET | ORAL | Status: DC | PRN

## 2017-04-28 MED ORDER — ASPIRIN EC 325 MG PO TBEC
325.0000 mg | DELAYED_RELEASE_TABLET | Freq: Every day | ORAL | Status: DC
  Administered 2017-04-28 – 2017-04-29 (×2): 325 mg via ORAL
  Filled 2017-04-28 (×2): qty 1

## 2017-04-28 MED ORDER — GI COCKTAIL ~~LOC~~: 30.0000 mL | Freq: Four times a day (QID) | ORAL | Status: DC | PRN

## 2017-04-28 NOTE — Progress Notes (Signed)
*  PRELIMINARY RESULTS* Echocardiogram 2D Echocardiogram has been performed.  Stephen Roberts 04/28/2017, 3:19 PM

## 2017-04-28 NOTE — ED Triage Notes (Signed)
Pt reports has been having pain in neck and shoulder blades for the past 3 months.  Reports has been on gabapentin and his doctor changed it to Lyrica 2 days ago.  Pt took the lyrica for the first time at 3pm on Wednesday and had an episode of almost passing out and was disoriented.  Pt didn't take the lyrica yesterday but took a dose today.  Approx 1 hour after taking it he started feeling dizzy, worsening sob, and blurred vision.  Pt says has been having sob for the past month intermittently but nothing this severe.

## 2017-04-28 NOTE — H&P (Signed)
History and Physical    Stephen Roberts MOQ:[id number] DOB: [birth year] DOA: 04/28/2017  PCP: Orlena Sheldon, PA-C  Patient coming from: Home  I have personally briefly reviewed patient's old medical records in [place]  Chief Complaint: Dyspnea, chest discomfort  HPI: Stephen Roberts is a 51 y.o. male with medical history significant of tobacco use, obesity, chronic pain, presents to the hospital with complaints of shortness of breath and pain between his shoulder blades.  He reports that over the last week, he has had much more rapid progression of fatigue, dizziness and shortness of breath.  He attributes his symptoms to either gabapentin or Lyrica.  He noticed that his breathing got significantly worse last night when he took a dose of Lyrica.  Shortness of breath appears to be progressing more chronically.  He has had intermittent chest tightness and shortness of breath for approximately 1-1/2 months.  ED Course: Vitals were noted to be stable.  EKG did not show any acute changes.  Cardiac enzymes were negative.  D-dimer was also negative.  Chest x-ray did not show any acute process.  BNP was normal.  Review of Systems: As per HPI otherwise 10 point review of systems negative.   Past Medical History:  Diagnosis Date  . Arthritis   . GERD (gastroesophageal reflux disease)   . Heart murmur   . Pneumonia 06/27/2006    Past Surgical History:  Procedure Laterality Date  . ANTERIOR FUSION CERVICAL SPINE    . INSERTION OF MESH N/A 01/23/2013   Procedure: INSERTION OF MESH;  Surgeon: Adin Hector, MD;  Location: [facility];  Service: General;  Laterality: N/A;  . TONSILLECTOMY     as child  . VENTRAL HERNIA REPAIR N/A 01/23/2013   Procedure: LAPAROSCOPIC MULTIPLE VENTRAL HERNIAS;  Surgeon: Adin Hector, MD;  Location: [facility];  Service: General;  Laterality: N/A;     reports that he has been smoking cigarettes.  He has a 15.00 pack-year smoking history. he has never used  smokeless tobacco. He reports that he drinks alcohol. He reports that he does not use drugs.  Allergies  Allergen Reactions  . Gabapentin Shortness Of Breath  . Lyrica [Pregabalin] Shortness Of Breath    Family History  Problem Relation Age of Onset  . Depression Mother   . Hearing loss Mother   . Hyperlipidemia Mother   . Hypertension Mother   . Stroke Mother   . Early death Father   . Diabetes Brother      Prior to Admission medications   Medication Sig Start Date End Date Taking? Authorizing Provider  ibuprofen (ADVIL,MOTRIN) 200 MG tablet Take 800 mg by mouth every 6 (six) hours as needed for mild pain or moderate pain.   Yes [provider]  Multiple Vitamin (MULTIVITAMIN) tablet Take 1 tablet by mouth daily.   Yes [provider]  Probiotic Product (PROBIOTIC DAILY PO) Take 1 tablet by mouth daily.   Yes [provider]  sildenafil (VIAGRA) 100 MG tablet TAKE AS DIRECTED 03/13/17  Yes Orlena Sheldon, PA-C  HYDROcodone-acetaminophen (NORCO/VICODIN) 5-325 MG tablet Take 1 tablet by mouth every 6 (six) hours as needed. Patient not taking: Reported on 04/28/2017 12/01/15   Orlena Sheldon, PA-C  oxyCODONE-acetaminophen (ROXICET) 5-325 MG tablet Take 1 tablet by mouth every 8 (eight) hours as needed for severe pain. Patient not taking: Reported on 04/28/2017 12/01/15   Orlena Sheldon, PA-C    Physical Exam: Vitals:  04/28/17 1100 04/28/17 1130 04/28/17 1300 04/28/17 1330  BP: 120/83 124/80 (!) 110/99 (!) 128/92  Pulse: 76 79 67 73  Resp: 18 18 19 17   Temp:    (!) 97.5 F (36.4 C)  TempSrc:      SpO2: 98% 98% 95% 97%  Weight:    110.9 kg (244 lb 7.8 oz)  Height:    5\' 10"  (1.778 m)    Constitutional: NAD, calm, comfortable Vitals:   04/28/17 1100 04/28/17 1130 04/28/17 1300 04/28/17 1330  BP: 120/83 124/80 (!) 110/99 (!) 128/92  Pulse: 76 79 67 73  Resp: 18 18 19 17   Temp:    (!) 97.5 F (36.4 C)  TempSrc:      SpO2: 98% 98% 95% 97%    Weight:    110.9 kg (244 lb 7.8 oz)  Height:    5\' 10"  (1.778 m)   Eyes: PERRL, lids and conjunctivae normal ENMT: Mucous membranes are moist. Posterior pharynx clear of any exudate or lesions.Normal dentition.  Neck: normal, supple, no masses, no thyromegaly Respiratory: clear to auscultation bilaterally, no wheezing, no crackles. Normal respiratory effort. No accessory muscle use.  Cardiovascular: Regular rate and rhythm, no murmurs / rubs / gallops. No extremity edema. 2+ pedal pulses. No carotid bruits.  Abdomen: no tenderness, no masses palpated. No hepatosplenomegaly. Bowel sounds positive.  Musculoskeletal: no clubbing / cyanosis. No joint deformity upper and lower extremities. Good ROM, no contractures. Normal muscle tone.  Skin: no rashes, lesions, ulcers. No induration Neurologic: CN 2-12 grossly intact. Sensation intact, DTR normal. Strength 5/5 in all 4.  Psychiatric: Normal judgment and insight. Alert and oriented x 3. Normal mood.   Labs on Admission: I have personally reviewed following labs and imaging studies  CBC: Recent Labs  Lab 04/28/17 0925  WBC 6.4  NEUTROABS 3.4  HGB 13.4  HCT 41.9  MCV 97.0  PLT 268   Basic Metabolic Panel: Recent Labs  Lab 04/28/17 0925 04/28/17 1256  NA 136  --   K 4.0  --   CL 103  --   CO2 27  --   GLUCOSE 195*  --   BUN 19  --   CREATININE 0.91  --   CALCIUM 8.8*  --   MG  --  2.0   GFR: Estimated Creatinine Clearance: 117.1 mL/min (by C-G formula based on SCr of 0.91 mg/dL). Liver Function Tests: Recent Labs  Lab 04/28/17 0925  AST 26  ALT 26  ALKPHOS 122  BILITOT 0.3  PROT 7.1  ALBUMIN 3.8   No results for input(s): LIPASE, AMYLASE in the last 168 hours. No results for input(s): AMMONIA in the last 168 hours. Coagulation Profile: No results for input(s): INR, PROTIME in the last 168 hours. Cardiac Enzymes: Recent Labs  Lab 04/28/17 0925 04/28/17 1256  TROPONINI <0.03 <0.03   BNP (last 3 results) No  results for input(s): PROBNP in the last 8760 hours. HbA1C: No results for input(s): HGBA1C in the last 72 hours. CBG: No results for input(s): GLUCAP in the last 168 hours. Lipid Profile: No results for input(s): CHOL, HDL, LDLCALC, TRIG, CHOLHDL, LDLDIRECT in the last 72 hours. Thyroid Function Tests: Recent Labs    04/28/17 1256  TSH 2.228   Anemia Panel: No results for input(s): VITAMINB12, FOLATE, FERRITIN, TIBC, IRON, RETICCTPCT in the last 72 hours. Urine analysis:    Component Value Date/Time   COLORURINE YELLOW 01/26/2013 0651   APPEARANCEUR CLEAR 01/26/2013 0651   LABSPEC 1.017 01/26/2013  [place] 7.0 01/26/2013 [place] 01/26/2013 0651   HGBUR NEGATIVE 01/26/2013 0651   BILIRUBINUR NEGATIVE 01/26/2013 [place] 01/26/2013 0651   PROTEINUR NEGATIVE 01/26/2013 0651   UROBILINOGEN 0.2 01/26/2013 0651   NITRITE NEGATIVE 01/26/2013 0651   LEUKOCYTESUR NEGATIVE 01/26/2013 0651    Radiological Exams on Admission: Dg Chest 2 View  Result Date: 04/28/2017 CLINICAL DATA:  Shortness of breath for 1.5 months EXAM: CHEST  2 VIEW COMPARISON:  01/25/2013 FINDINGS: There is no edema, consolidation, effusion, or pneumothorax. Normal heart size and mediastinal contours. Status post 2 level lower ACDF. No acute osseous finding. IMPRESSION: No active cardiopulmonary disease. Electronically Signed   By: Monte Fantasia M.D.   On: 04/28/2017 10:17    EKG: Independently reviewed.  PVCs, no acute changes  Assessment/Plan Active Problems:   Obesity, unspecified   Tobacco use disorder   Chronic pain syndrome   Dyspnea   Chest pain   1. Chest pain/dyspnea.  Atypical symptoms.  Admit to observation, telemetry overnight.  Cycle cardiac markers.  He has been seen by cardiology.  Echocardiogram has been done and is unremarkable.  If he rules out for ACS and does not have any further symptoms, anticipate discharge tomorrow with outpatient cardiology  follow-up for ischemic testing. 2. Tobacco use.  Counseled on the importance of tobacco cessation. 3. Chronic pain syndrome.  Related to cervical disc disease.  We will not continue any gabapentin/Lyrica, since patient feels this may be related to his symptoms.  There are plans for the patient to undergo surgery for his disc disease.  DVT prophylaxis: Lovenox Code Status: Full code Family Communication: Discussed with wife at the bedside Disposition Plan: Discharge home when improved Consults called: cardiology Admission status: observation, tele   Kathie Dike MD [facility] Pager [phone]  If 7PM-7AM, please contact night-coverage [email or web address] Password [facility]  04/28/2017, 7:23 PM

## 2017-04-28 NOTE — Consult Note (Signed)
Cardiology Consultation:   Patient ID: Stephen Roberts; [id number]; 1965/09/18   Admit date: 04/28/2017 Date of Consult: 04/28/2017  Primary Care Provider: Orlena Sheldon, PA-C Primary Cardiologist: n/a Primary Electrophysiologist:  n/a   Patient Profile:   Stephen Roberts is a 51 y.o. male with a hx of chronic neck/back pain and tobacco abusewho is being seen today for the evaluation of SOB at the request of Dr Roderic Palau.  History of Present Illness:   Stephen Roberts 51 yo male history of chronic neck and back pain and tobacco abuse presents with SOB.  He reports over the last month a signifcant change in his overall energy level as well as SOB. He reports he used to [employer] the 43 year olds at his job, now has difficultly keeping up. Part of his limitations are also limited to worsening chronic neck and back pain.  Over the las week he reports much more rapid progression of fatigue, dizziness, and SOB. He attributed the symptoms to either gabapentin or lyrica, both new meds he had recently started for his chronic pain.  He does report about 1.5 months of interminttent chest tightness. Tends to occur at rest, 5/10 in severity with +SOB. Can last up to 10-30 minutes. This is different from his chronic neck/back pain and pain he gets between his shoulder blades.   He reports an episode on Wed where his dizziness became so bad while standing he passed out at work.    K 4, Cr 0.91, WBC 6.4, Plt 173, D-dimer neg, BNP 11 CXR no acute process Trop neg x 1 EKG SR, PVCs, LAFB, poor R wave progression Past Medical History:  Diagnosis Date  . Arthritis   . GERD (gastroesophageal reflux disease)   . Heart murmur   . Pneumonia 06/27/2006    Past Surgical History:  Procedure Laterality Date  . ANTERIOR FUSION CERVICAL SPINE    . INSERTION OF MESH N/A 01/23/2013   Procedure: INSERTION OF MESH;  Surgeon: Adin Hector, MD;  Location: [facility];  Service: General;  Laterality: N/A;  .  TONSILLECTOMY     as child  . VENTRAL HERNIA REPAIR N/A 01/23/2013   Procedure: LAPAROSCOPIC MULTIPLE VENTRAL HERNIAS;  Surgeon: Adin Hector, MD;  Location: [facility];  Service: General;  Laterality: N/A;       Inpatient Medications: Scheduled Meds:  Continuous Infusions:  PRN Meds:   Allergies:    Allergies  Allergen Reactions  . Gabapentin Shortness Of Breath  . Lyrica [Pregabalin] Shortness Of Breath    Social History:   Social History   Socioeconomic History  . Marital status: Married    Spouse name: Not on file  . Number of children: Not on file  . Years of education: Not on file  . Highest education level: Not on file  Social Needs  . Financial resource strain: Not on file  . Food insecurity - worry: Not on file  . Food insecurity - inability: Not on file  . Transportation needs - medical: Not on file  . Transportation needs - non-medical: Not on file  Occupational History  . Not on file  Tobacco Use  . Smoking status: Current Every Day Smoker    Packs/day: 0.50    Years: 30.00    Pack years: 15.00    Types: Cigarettes  . Smokeless tobacco: Never Used  Substance and Sexual Activity  . Alcohol use: Yes    Comment: drinks moderately once a week.  . Drug use: No  .  Sexual activity: Yes  Other Topics Concern  . Not on file  Social History Narrative  . Not on file    Family History:    Family History  Problem Relation Age of Onset  . Depression Mother   . Hearing loss Mother   . Hyperlipidemia Mother   . Hypertension Mother   . Stroke Mother   . Early death Father   . Diabetes Brother      ROS:  Please see the history of present illness.  ROS  All other ROS reviewed and negative.     Physical Exam/Data:   Vitals:   04/28/17 1000 04/28/17 1030 04/28/17 1100 04/28/17 1130  BP: (!) 132/101 138/90 120/83 124/80  Pulse: 78 80 76 79  Resp: 16 15 18 18   Temp:      TempSrc:      SpO2: 97% 98% 98% 98%  Weight:      Height:       No  intake or output data in the 24 hours ending 04/28/17 1203 Filed Weights   04/28/17 0922  Weight: 230 lb (104.3 kg)   Body mass index is 33 kg/m.  General:  Well nourished, well developed, in no acute distress HEENT: normal Lymph: no adenopathy Neck: no JVD Endocrine:  No thryomegaly Cardiac:  normal S1, S2; RRR; no murmur Lungs:  clear to auscultation bilaterally, no wheezing, rhonchi or rales  Abd: soft, nontender, no hepatomegaly  Ext: no edema Musculoskeletal:  No deformities, BUE and BLE strength normal and equal Skin: warm and dry  Neuro:  CNs 2-12 intact, no focal abnormalities noted Psych:  Normal affect   EKG:  The EKG was personally reviewed and demonstrates:  SR, LAFB, PVCs Telemetry:  Telemetry was personally reviewed and demonstrates:  SR with PVCs    Laboratory Data:  Chemistry Recent Labs  Lab 04/28/17 0925  NA 136  K 4.0  CL 103  CO2 27  GLUCOSE 195*  BUN 19  CREATININE 0.91  CALCIUM 8.8*  GFRNONAA >60  GFRAA >60  ANIONGAP 6    Recent Labs  Lab 04/28/17 0925  PROT 7.1  ALBUMIN 3.8  AST 26  ALT 26  ALKPHOS 122  BILITOT 0.3   Hematology Recent Labs  Lab 04/28/17 0925  WBC 6.4  RBC 4.32  HGB 13.4  HCT 41.9  MCV 97.0  MCH 31.0  MCHC 32.0  RDW 14.3  PLT 173   Cardiac Enzymes Recent Labs  Lab 04/28/17 0925  TROPONINI <0.03   No results for input(s): TROPIPOC in the last 168 hours.  BNP Recent Labs  Lab 04/28/17 0925  BNP 11.0    DDimer  Recent Labs  Lab 04/28/17 0925  DDIMER 0.44    Radiology/Studies:  Dg Chest 2 View  Result Date: 04/28/2017 CLINICAL DATA:  Shortness of breath for 1.5 months EXAM: CHEST  2 VIEW COMPARISON:  01/25/2013 FINDINGS: There is no edema, consolidation, effusion, or pneumothorax. Normal heart size and mediastinal contours. Status post 2 level lower ACDF. No acute osseous finding. IMPRESSION: No active cardiopulmonary disease. Electronically Signed   By: Monte Fantasia M.D.   On: 04/28/2017  10:17    Assessment and Plan:   1. SOB/Chest pain - convoluted symptoms given his chronic neck and back pain, as well as potential side effects to recent trials of gabapentin and lyrica - teasing out the cardiac symptoms, he does reports a 1.5-2 month history of worsening fatigue, SOB/DOE, and intermittent chest tightness. His CAD risk factors  include tobacco abuse and male over 4. He has frequent PVCs on tele that may or may not reflect underlying cardiac disease - recommend overnight obs with cycling of enzymes and EKG, echocardiogram. Pending results consider noninvasive vs invasive ischemic testing.  2. Syncope - isolated episode. Unclear how much gabapentin or lyrica may have played a role in the severe dizziness he has been having - check orthostatics.  - monitor on tele for any arrhythmias, currenly just intermittent PVCs  3. PVCs - check K, Mg, TSH - f/u echo - likely will need ischemic testing given his constellation of symptoms   4. Chronic pain - per primary team, would avoid gabapentin and lyrica at this time  For questions or updates, please contact [place] Please consult [email or web address] for contact info under Cardiology/STEMI.   Merrily Pew, MD  04/28/2017 12:03 PM

## 2017-04-28 NOTE — ED Provider Notes (Signed)
[facility] EMERGENCY DEPARTMENT Provider Note   CSN: [id number] Arrival date & time: 04/28/17  2706     History   Chief Complaint Chief Complaint  Patient presents with  . Shortness of Breath  . Dizziness    HPI Stephen Roberts is a 51 y.o. male.  HPI  Stephen Roberts is a 51 y.o. male who presents to the Emergency Department complaining of persistent shortness of breath and neck pain for 3 months.  He states the neck pain is chronic and he is currently under the care of Dr. Patrice Paradise.  Had MRI of his neck earlier this month and told that he will need surgery.  He states that he has been having recurrent episodes of shortness of breath, fatigue with pain to his bilateral shoulder blade area. He contributes his symptoms to gabapentin which he was initially taking for his neck symptoms and was recently switched to Lyrica.  States his symptoms have been worse since taking the Lyrica and this morning, the shortness of breath was much worse and associated with some dizziness that resolved prior to arrival.  He denies chest pain, cough, fever, jaw pain and extremity numbness or headache.  No nausea or vomiting.  He admits to recent weight gain and he is a smoker.      Past Medical History:  Diagnosis Date  . Arthritis   . GERD (gastroesophageal reflux disease)   . Heart murmur   . Pneumonia 06/27/2006    Patient Active Problem List   Diagnosis Date Noted  . Chronic pain syndrome 11/10/2014  . Smoker 07/30/2014  . Allergic rhinitis 07/30/2014  . Allergic dermatitis 07/30/2014  . Prostate cancer screening 07/30/2014  . Screening for colorectal cancer 07/30/2014  . Incarcerated epigastric hernia 01/11/2013  . Incarcerated umbilical hernia 23/76/2831  . Obesity, unspecified 01/11/2013  . Tobacco use disorder 01/11/2013    Past Surgical History:  Procedure Laterality Date  . ANTERIOR FUSION CERVICAL SPINE    . INSERTION OF MESH N/A 01/23/2013   Procedure: INSERTION OF MESH;   Surgeon: Adin Hector, MD;  Location: [facility];  Service: General;  Laterality: N/A;  . TONSILLECTOMY     as child  . VENTRAL HERNIA REPAIR N/A 01/23/2013   Procedure: LAPAROSCOPIC MULTIPLE VENTRAL HERNIAS;  Surgeon: Adin Hector, MD;  Location: [facility];  Service: General;  Laterality: N/A;       Home Medications    Prior to Admission medications   Medication Sig Start Date End Date Taking? Authorizing Provider  HYDROcodone-acetaminophen (NORCO/VICODIN) 5-325 MG tablet Take 1 tablet by mouth every 6 (six) hours as needed. 12/01/15   Orlena Sheldon, PA-C  oxyCODONE-acetaminophen (ROXICET) 5-325 MG tablet Take 1 tablet by mouth every 8 (eight) hours as needed for severe pain. 12/01/15   Rennis Golden  PROAIR HFA 108 [phone] BASE) MCG/ACT inhaler  06/30/14   [provider]  sildenafil (VIAGRA) 100 MG tablet TAKE AS DIRECTED 03/13/17   Orlena Sheldon, PA-C    Family History Family History  Problem Relation Age of Onset  . Depression Mother   . Hearing loss Mother   . Hyperlipidemia Mother   . Hypertension Mother   . Stroke Mother   . Early death Father   . Diabetes Brother     Social History Social History   Tobacco Use  . Smoking status: Current Every Day Smoker    Packs/day: 0.50    Years: 30.00    Pack years: 15.00  Types: Cigarettes  . Smokeless tobacco: Never Used  Substance Use Topics  . Alcohol use: Yes    Comment: drinks moderately once a week.  . Drug use: No     Allergies   Patient has no known allergies.   Review of Systems Review of Systems  Constitutional: Positive for fatigue. Negative for chills and fever.  HENT: Negative for congestion and trouble swallowing.   Eyes: Negative for visual disturbance.  Respiratory: Positive for shortness of breath. Negative for cough, chest tightness and wheezing.   Cardiovascular: Negative for chest pain and leg swelling.  Gastrointestinal: Negative for abdominal pain, nausea and vomiting.    Genitourinary: Negative for dysuria and flank pain.  Musculoskeletal: Positive for neck pain.  Skin: Negative for color change and rash.  Neurological: Positive for dizziness. Negative for seizures, syncope, speech difficulty, weakness, light-headedness and headaches.  Psychiatric/Behavioral: Negative for confusion.     Physical Exam Updated Vital Signs BP 135/88 (BP Location: Left Arm)   Pulse 83   Temp 97.9 F (36.6 C) (Oral)   Resp 20   Ht 5\' 10"  (1.778 m)   Wt 104.3 kg (230 lb)   SpO2 97%   BMI 33.00 kg/m   Physical Exam  Constitutional: He is oriented to person, place, and time. He appears well-developed and well-nourished. He does not appear ill.  HENT:  Head: Atraumatic.  Mouth/Throat: Oropharynx is clear and moist.  Eyes: EOM are normal. Pupils are equal, round, and reactive to light.  Neck: No JVD present.  Cardiovascular: Normal rate, regular rhythm and intact distal pulses.  Pulmonary/Chest: Effort normal. No respiratory distress. He has no wheezes. He has no rales. He exhibits no tenderness.  Abdominal: Soft. He exhibits no distension and no mass. There is no tenderness. There is no guarding.  Musculoskeletal: He exhibits no edema.       Cervical back: He exhibits tenderness. He exhibits no spasm and normal pulse.  ttp of the cervical spine and l;eft cervical paraspinal muscles.  Left arm pain on abduction.  5/5 grip strength bilaterally  Lymphadenopathy:    He has no cervical adenopathy.  Neurological: He is alert and oriented to person, place, and time. He has normal strength. No sensory deficit. Gait normal. GCS eye subscore is 4. GCS verbal subscore is 5. GCS motor subscore is 6.  CN III-XII intact.  No facial droop or dysarthria.  No pronator drift.    Skin: Skin is warm. Capillary refill takes less than 2 seconds. No rash noted. No pallor.  Psychiatric: He has a normal mood and affect.  Nursing note and vitals reviewed.    ED Treatments / Results   Labs (all labs ordered are listed, but only abnormal results are displayed) Labs Reviewed  COMPREHENSIVE METABOLIC PANEL - Abnormal; Notable for the following components:      Result Value   Glucose, Bld 195 (*)    Calcium 8.8 (*)    All other components within normal limits  TROPONIN I  CBC WITH DIFFERENTIAL/PLATELET  D-DIMER, QUANTITATIVE (NOT AT [facility])  BRAIN NATRIURETIC PEPTIDE  TROPONIN I  MAGNESIUM  TSH    EKG  EKG Interpretation  Date/Time:  Friday April 28 2017 09:23:22 EST Ventricular Rate:  83 PR Interval:    QRS Duration: 93 QT Interval:  386 QTC Calculation: 454 R Axis:   -35 Text Interpretation:  Sinus rhythm Ventricular trigeminy Left axis deviation Abnormal R-wave progression, late transition Confirmed by Nat Christen [phone]) on 04/28/2017 10:12:19 AM  Radiology Dg Chest 2 View  Result Date: 04/28/2017 CLINICAL DATA:  Shortness of breath for 1.5 months EXAM: CHEST  2 VIEW COMPARISON:  01/25/2013 FINDINGS: There is no edema, consolidation, effusion, or pneumothorax. Normal heart size and mediastinal contours. Status post 2 level lower ACDF. No acute osseous finding. IMPRESSION: No active cardiopulmonary disease. Electronically Signed   By: Monte Fantasia M.D.   On: 04/28/2017 10:17    Procedures Procedures (including critical care time)  Medications Ordered in ED Medications - No data to display   Initial Impression / Assessment and Plan / ED Course  I have reviewed the triage vital signs and the nursing notes.  Pertinent labs & imaging results that were available during my care of the patient were reviewed by me and considered in my medical decision making (see chart for details).      Pt reports feeling better upon my exam.  Labs, EKG, initial trop, D dimer reassuring, but history is concerning and pt does have some cardiac risk factors.  Also seen by Dr. Lindajo Royal and care plan discussed.   [place] who recommends  medicine admit for cardiac r/o.  I will consult hospitalist  1210 Consulted Dr. Roderic Palau who will admit.    Final Clinical Impressions(s) / ED Diagnoses   Final diagnoses:  Shortness of breath    ED Discharge Orders    None       Kem Parkinson, PA-C 04/28/17 1709    Nat Christen, MD 05/01/17 [phone]

## 2017-04-29 DIAGNOSIS — R079 Chest pain, unspecified: Secondary | ICD-10-CM | POA: Diagnosis not present

## 2017-04-29 DIAGNOSIS — R0600 Dyspnea, unspecified: Secondary | ICD-10-CM | POA: Diagnosis not present

## 2017-04-29 DIAGNOSIS — G894 Chronic pain syndrome: Secondary | ICD-10-CM | POA: Diagnosis not present

## 2017-04-29 LAB — TROPONIN I

## 2017-04-29 MED ORDER — PANTOPRAZOLE SODIUM 40 MG PO TBEC: 40.0000 mg | DELAYED_RELEASE_TABLET | Freq: Every day | ORAL | 0 refills | Status: DC

## 2017-04-29 MED ORDER — IBUPROFEN 200 MG PO TABS: 800.0000 mg | ORAL_TABLET | Freq: Three times a day (TID) | ORAL | 0 refills | Status: DC | PRN

## 2017-04-29 NOTE — Discharge Summary (Signed)
Physician Discharge Summary  Stephen Roberts:[id number] DOB: 1966/03/10 DOA: 04/28/2017  PCP: Orlena Sheldon, PA-C  Admit date: 04/28/2017 Discharge date: 04/29/2017  Admitted From: Home Disposition: Home  Recommendations for Outpatient Follow-up:  1. Follow up with PCP in 1-2 weeks 2. Please obtain BMP/CBC in one week 3. Patient will follow up with cardiology for outpatient stress test 4. Follow-up with spine surgeon for surgical management of cervical disc disease   Discharge Condition: Stable CODE STATUS: Full code Diet recommendation: Heart Healthy   Brief/Interim Summary: 51 year old male with a history of tobacco use, obesity and chronic pain syndrome from cervical disc disease, presented to hospital with progressive shortness of breath and pain between his shoulder blades.  He was recently started on Lyrica and felt that this medication may be causing some of his symptoms.  EKG and troponin were negative in the emergency room.  D-dimer was also negative.  Chest x-ray did not show any acute findings and BNP was normal.  Since he did have risk factors for coronary disease, he was admitted to the hospital where he ruled out for ACS with negative cardiac markers.  He was seen by cardiology and underwent echocardiogram which was also unrevealing.  Plan will be for outpatient stress testing.  Since his admission, patient is feeling significantly better.  He is not having any shortness of breath, dizziness or chest pain.  He has been advised to refrain from further use of Lyrica.  He plans to follow-up with his spine surgeon for operative management of his cervical disc disease.  He does report taking large amounts of ibuprofen.  He is also taking generic Nexium.  He has been advised to limit his ibuprofen use.  Remainder of his medical issues are stable.  Discharge Diagnoses:  Active Problems:   Obesity, unspecified   Tobacco use disorder   Chronic pain syndrome   Dyspnea   Chest  pain    Discharge Instructions  Discharge Instructions    Diet - low sodium heart healthy   Complete by:  As directed    Increase activity slowly   Complete by:  As directed      Allergies as of 04/29/2017      Reactions   Gabapentin Shortness Of Breath   Lyrica [pregabalin] Shortness Of Breath      Medication List    STOP taking these medications   HYDROcodone-acetaminophen 5-325 MG tablet Commonly known as:  NORCO/VICODIN   oxyCODONE-acetaminophen 5-325 MG tablet Commonly known as:  ROXICET     TAKE these medications   ibuprofen 200 MG tablet Commonly known as:  ADVIL,MOTRIN Take 4 tablets (800 mg total) by mouth every 8 (eight) hours as needed for mild pain or moderate pain. What changed:  when to take this   multivitamin tablet Take 1 tablet by mouth daily.   pantoprazole 40 MG tablet Commonly known as:  PROTONIX Take 1 tablet (40 mg total) by mouth daily. Start taking on:  04/30/2017   PROBIOTIC DAILY PO Take 1 tablet by mouth daily.   sildenafil 100 MG tablet Commonly known as:  VIAGRA TAKE AS DIRECTED       Allergies  Allergen Reactions  . Gabapentin Shortness Of Breath  . Lyrica [Pregabalin] Shortness Of Breath    Consultations:  Cardiology   Procedures/Studies: Dg Eye Foreign Body  Result Date: 04/18/2017 CLINICAL DATA:  Metal working/exposure; clearance prior to MRI EXAM: ORBITS FOR FOREIGN BODY - 2 VIEW COMPARISON:  None. FINDINGS: There is no  evidence of metallic foreign body within the orbits. No significant bone abnormality identified. IMPRESSION: No evidence of metallic foreign body within the orbits. Electronically Signed   By: Lorriane Shire M.D.   On: 04/18/2017 09:54   Dg Chest 2 View  Result Date: 04/28/2017 CLINICAL DATA:  Shortness of breath for 1.5 months EXAM: CHEST  2 VIEW COMPARISON:  01/25/2013 FINDINGS: There is no edema, consolidation, effusion, or pneumothorax. Normal heart size and mediastinal contours. Status post  2 level lower ACDF. No acute osseous finding. IMPRESSION: No active cardiopulmonary disease. Electronically Signed   By: Monte Fantasia M.D.   On: 04/28/2017 10:17   Mr Cervical Spine Wo Contrast  Addendum Date: 04/18/2017   ADDENDUM REPORT: 04/18/2017 12:11 ADDENDUM: In addition there should also be an impression #6 which reads: 6. 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 Electronically Signed   By: Genevie Ann M.D.   On: 04/18/2017 12:11   Result Date: 04/18/2017 CLINICAL DATA:  51 year old male with prior spinal fusion. Cervical neck pain radiating to the right shoulder for 3 months. EXAM: MRI CERVICAL SPINE WITHOUT CONTRAST TECHNIQUE: Multiplanar, multisequence MR imaging of the cervical spine was performed. No intravenous contrast was administered. COMPARISON:  Cervical spine CT 07/03/2010. FINDINGS: Alignment: Stable cervical lordosis with mild postoperative straightening of the lower cervical spine. No spondylolisthesis. Vertebrae: Mild hardware susceptibility artifact related to chronic C5 through C7 ACDF. Borderline to mild marrow edema in the right C7-T1 facets (series 4, image 2). No other No marrow edema or evidence of acute osseous abnormality. Background bone marrow signal is normal. Cord: Spinal cord signal is within normal limits at all visualized levels. Posterior Fossa, vertebral arteries, paraspinal tissues: Negative visualized brain parenchyma. Absent flow void in the right vertebral artery in the neck (series 6, image 9). The left vertebral artery does appear mildly dominant. Flow may be reconstituted in the intracranial portion of the distal right vertebral artery, uncertain. Otherwise negative neck soft tissues. Disc levels: C2-C3: Disc space loss with posterior and foraminal mild disc bulging and endplate spurring. Mild to moderate facet hypertrophy greater on the left. No spinal stenosis. Mild bilateral C3 foraminal stenosis appears stable since 2012. C3-C4:  Chronic disc space loss with left eccentric broad-based posterior and foraminal disc bulging and endplate spurring. Moderate to severe chronic facet hypertrophy on the left, and mild on the right. Moderate ligament flavum hypertrophy. Spinal stenosis with mild spinal cord mass effect. Severe left C4 neural foraminal stenosis. Mild right foraminal stenosis. This level is probably stable since 2012. C4-C5: Mild disc space loss with circumferential disc bulge and endplate spurring. Broad-based posterior and foraminal involvement. Moderate facet hypertrophy greater on the right. Mild ligament flavum hypertrophy. Spinal stenosis with mild spinal cord mass effect. Moderate to severe bilateral C5 foraminal stenosis greater on the right. This level may have progressed since 2012. C5-C6:  Prior ACDF with evidence of solid arthrodesis.  No stenosis. C6-C7:  Prior ACDF with evidence of solid arthrodesis.  No stenosis. C7-T1: Mild circumferential disc bulge and endplate spurring with small superimposed central disc protrusion best seen on series 6, image 27. Moderate bilateral facet hypertrophy appears progressed since 2012 and is greater on the right. No spinal stenosis. Mild right C8 neural foraminal stenosis. T1-T2: Small central disc protrusion. Mild facet hypertrophy. No definite stenosis. IMPRESSION: 1. Prior ACDF at C5-C6 and C6-C7 with solid arthrodesis. 2. Adjacent segment disease at C7-T1 with progressed facet arthropathy since 2012. Mild marrow edema in the right  side facets here is compatible with acute exacerbation of the chronic facet joint arthritis. Consider this as a source of the recent onset right side pain. 3. Adjacent segment disease at C4-C5 may have progressed since 2012. Multifactorial spinal stenosis with mild cord mass effect and moderate to severe bilateral C5 foraminal stenosis, greater on the right. 4. Chronic disc, endplate and posterior element degeneration at C2-C3 and C3-C4 appears stable since  2012. Chronic C3-C4 spinal stenosis with mild cord mass effect and severe left C4 neural foraminal stenosis. 5. No cervical spinal cord signal abnormality. Electronically Signed: By: Genevie Ann M.D. On: 04/18/2017 11:43    Echo: - Left ventricle: The cavity size was normal. Wall thickness was   increased in a pattern of mild LVH. Systolic function was normal.   The estimated ejection fraction was in the range of 60% to 65%.   Wall motion was normal; there were no regional wall motion   abnormalities. Left ventricular diastolic function parameters   were normal. - Aortic valve: Mildly calcified annulus. Trileaflet; normal   thickness leaflets. Valve area (VTI): 2.6 cm^2. Valve area   (Vmax): 2.36 cm^2. Valve area (Vmean): 2.3 cm^2. - Left atrium: The atrium was moderately dilated. - Technically adequate study.   Subjective: Feeling better.  No shortness of breath.  No chest pain.  No dizziness.  Discharge Exam: Vitals:   04/29/17 0400 04/29/17 0800  BP: 111/79 122/83  Pulse: 70 70  Resp: 16 16  Temp: 97.8 F (36.6 C) 97.7 F (36.5 C)  SpO2: 99% 98%   Vitals:   04/28/17 2000 04/29/17 0000 04/29/17 0400 04/29/17 0800  BP:  116/65 111/79 122/83  Pulse:  84 70 70  Resp:  18 16 16   Temp: 98.5 F (36.9 C) 98.2 F (36.8 C) 97.8 F (36.6 C) 97.7 F (36.5 C)  TempSrc: Oral Oral Oral Oral  SpO2: 96% 97% 99% 98%  Weight:      Height:        General: Pt is alert, awake, not in acute distress Cardiovascular: RRR, S1/S2 +, no rubs, no gallops Respiratory: CTA bilaterally, no wheezing, no rhonchi Abdominal: Soft, NT, ND, bowel sounds + Extremities: no edema, no cyanosis    The results of significant diagnostics from this hospitalization (including imaging, microbiology, ancillary and laboratory) are listed below for reference.     Microbiology: No results found for this or any previous visit (from the past 240 hour(s)).   Labs: BNP (last 3 results) Recent Labs     04/28/17 0925  BNP 67.8   Basic Metabolic Panel: Recent Labs  Lab 04/28/17 0925 04/28/17 1256  NA 136  --   K 4.0  --   CL 103  --   CO2 27  --   GLUCOSE 195*  --   BUN 19  --   CREATININE 0.91  --   CALCIUM 8.8*  --   MG  --  2.0   Liver Function Tests: Recent Labs  Lab 04/28/17 0925  AST 26  ALT 26  ALKPHOS 122  BILITOT 0.3  PROT 7.1  ALBUMIN 3.8   No results for input(s): LIPASE, AMYLASE in the last 168 hours. No results for input(s): AMMONIA in the last 168 hours. CBC: Recent Labs  Lab 04/28/17 0925  WBC 6.4  NEUTROABS 3.4  HGB 13.4  HCT 41.9  MCV 97.0  PLT 173   Cardiac Enzymes: Recent Labs  Lab 04/28/17 0925 04/28/17 1256 04/28/17 2018 04/29/17 0154  TROPONINI <  0.03 <0.03 <0.03 <0.03   BNP: Invalid input(s): POCBNP CBG: No results for input(s): GLUCAP in the last 168 hours. D-Dimer Recent Labs    04/28/17 0925  DDIMER 0.44   Hgb A1c No results for input(s): HGBA1C in the last 72 hours. Lipid Profile No results for input(s): CHOL, HDL, LDLCALC, TRIG, CHOLHDL, LDLDIRECT in the last 72 hours. Thyroid function studies Recent Labs    04/28/17 1256  TSH 2.228   Anemia work up No results for input(s): VITAMINB12, FOLATE, FERRITIN, TIBC, IRON, RETICCTPCT in the last 72 hours. Urinalysis    Component Value Date/Time   COLORURINE YELLOW 01/26/2013 [place] 01/26/2013 0651   LABSPEC 1.017 01/26/2013 0651   PHURINE 7.0 01/26/2013 0651   GLUCOSEU NEGATIVE 01/26/2013 0651   HGBUR NEGATIVE 01/26/2013 0651   BILIRUBINUR NEGATIVE 01/26/2013 0651   KETONESUR NEGATIVE 01/26/2013 0651   PROTEINUR NEGATIVE 01/26/2013 0651   UROBILINOGEN 0.2 01/26/2013 0651   NITRITE NEGATIVE 01/26/2013 0651   LEUKOCYTESUR NEGATIVE 01/26/2013 0651   Sepsis Labs Invalid input(s): PROCALCITONIN,  WBC,  LACTICIDVEN Microbiology No results found for this or any previous visit (from the past 240 hour(s)).   Time coordinating discharge: Over  30 minutes  SIGNED:   Kathie Dike, MD  [facility] 04/29/2017, 12:34 PM Pager   If 7PM-7AM, please contact night-coverage [email or web address] Password [email or web address]

## 2017-04-29 NOTE — Progress Notes (Signed)
Discharge instructions gone over with patient, verbalized understanding. IV removed, patient tolerated procedure well. 

## 2017-04-29 NOTE — Progress Notes (Signed)
EKG completed and placed on chart 

## 2017-04-30 LAB — HIV ANTIBODY (ROUTINE TESTING W REFLEX): HIV Screen 4th Generation wRfx: NONREACTIVE

## 2017-05-04 ENCOUNTER — Telehealth: Payer: Self-pay

## 2017-05-04 DIAGNOSIS — R079 Chest pain, unspecified: Secondary | ICD-10-CM

## 2017-05-04 NOTE — Telephone Encounter (Signed)
-----   Message from Arnoldo Lenis, MD sent at 05/01/2017 11:51 AM EST ----- Regarding: FW: Follow-up stress test Please arrange a nuclear stress test for patient for chest pain. If he is comfortable on treadmill order exercise, if not please order a lexiscan. Does not need to hold any meds. Would like to get this week  Zandra Abts MD ----- Message ----- From: Kathie Dike, MD Sent: 04/29/2017  12:40 PM To: Arnoldo Lenis, MD Subject: Follow-up stress test                          Hi Dr. Harl Bowie,  Mr. Gilliam ruled out for ACS and felt fine the following day from admission.  As per your recommendations, he will need an outpatient stress test.  If you could please in help arranging this.  Thanks.

## 2017-05-04 NOTE — Telephone Encounter (Signed)
Spoke with pt., he states he will not be able to walk on treadmill. Placed order for lexi. Pt made aware that someone will call him with date/time.

## 2017-05-10 ENCOUNTER — Encounter (HOSPITAL_COMMUNITY): Payer: 59

## 2017-05-10 ENCOUNTER — Encounter (HOSPITAL_COMMUNITY): Payer: Self-pay

## 2017-05-16 HISTORY — PX: OTHER SURGICAL HISTORY: SHX169

## 2017-05-24 DIAGNOSIS — Z01812 Encounter for preprocedural laboratory examination: Secondary | ICD-10-CM | POA: Diagnosis not present

## 2017-05-24 DIAGNOSIS — G894 Chronic pain syndrome: Secondary | ICD-10-CM | POA: Diagnosis not present

## 2017-05-29 DIAGNOSIS — M5001 Cervical disc disorder with myelopathy,  high cervical region: Secondary | ICD-10-CM | POA: Diagnosis not present

## 2017-05-29 DIAGNOSIS — M4322 Fusion of spine, cervical region: Secondary | ICD-10-CM | POA: Diagnosis not present

## 2017-05-29 DIAGNOSIS — M4722 Other spondylosis with radiculopathy, cervical region: Secondary | ICD-10-CM | POA: Diagnosis not present

## 2017-05-29 DIAGNOSIS — M2578 Osteophyte, vertebrae: Secondary | ICD-10-CM | POA: Diagnosis not present

## 2017-05-29 DIAGNOSIS — M47892 Other spondylosis, cervical region: Secondary | ICD-10-CM | POA: Diagnosis not present

## 2017-05-30 DIAGNOSIS — M5011 Cervical disc disorder with radiculopathy,  high cervical region: Secondary | ICD-10-CM | POA: Insufficient documentation

## 2017-05-30 DIAGNOSIS — M4322 Fusion of spine, cervical region: Secondary | ICD-10-CM | POA: Diagnosis not present

## 2017-06-28 DIAGNOSIS — M25512 Pain in left shoulder: Secondary | ICD-10-CM | POA: Diagnosis not present

## 2017-06-28 DIAGNOSIS — M5001 Cervical disc disorder with myelopathy,  high cervical region: Secondary | ICD-10-CM | POA: Diagnosis not present

## 2017-06-28 DIAGNOSIS — M7552 Bursitis of left shoulder: Secondary | ICD-10-CM | POA: Diagnosis not present

## 2017-06-30 ENCOUNTER — Other Ambulatory Visit: Payer: Self-pay | Admitting: Orthopaedic Surgery

## 2017-06-30 ENCOUNTER — Ambulatory Visit
Admission: RE | Admit: 2017-06-30 | Discharge: 2017-06-30 | Disposition: A | Discharge status: Home / Self Care | Payer: 59 | Source: Ambulatory Visit | Attending: Orthopaedic Surgery | Admitting: Orthopaedic Surgery

## 2017-06-30 DIAGNOSIS — M75112 Incomplete rotator cuff tear or rupture of left shoulder, not specified as traumatic: Secondary | ICD-10-CM | POA: Diagnosis not present

## 2017-06-30 DIAGNOSIS — M25512 Pain in left shoulder: Secondary | ICD-10-CM

## 2017-08-02 DIAGNOSIS — M25512 Pain in left shoulder: Secondary | ICD-10-CM | POA: Diagnosis not present

## 2017-08-23 DIAGNOSIS — M7552 Bursitis of left shoulder: Secondary | ICD-10-CM | POA: Diagnosis not present

## 2017-08-23 DIAGNOSIS — G894 Chronic pain syndrome: Secondary | ICD-10-CM | POA: Diagnosis not present

## 2017-08-23 DIAGNOSIS — M25512 Pain in left shoulder: Secondary | ICD-10-CM | POA: Diagnosis not present

## 2017-08-23 DIAGNOSIS — Z79891 Long term (current) use of opiate analgesic: Secondary | ICD-10-CM | POA: Diagnosis not present

## 2017-08-23 DIAGNOSIS — M5001 Cervical disc disorder with myelopathy,  high cervical region: Secondary | ICD-10-CM | POA: Diagnosis not present

## 2017-08-23 DIAGNOSIS — Z79899 Other long term (current) drug therapy: Secondary | ICD-10-CM | POA: Diagnosis not present

## 2017-08-25 DIAGNOSIS — M5001 Cervical disc disorder with myelopathy,  high cervical region: Secondary | ICD-10-CM | POA: Diagnosis not present

## 2017-08-25 DIAGNOSIS — M25512 Pain in left shoulder: Secondary | ICD-10-CM | POA: Diagnosis not present

## 2017-08-25 DIAGNOSIS — M542 Cervicalgia: Secondary | ICD-10-CM | POA: Diagnosis not present

## 2017-08-30 DIAGNOSIS — M25561 Pain in right knee: Secondary | ICD-10-CM | POA: Diagnosis not present

## 2017-08-30 DIAGNOSIS — M25512 Pain in left shoulder: Secondary | ICD-10-CM | POA: Diagnosis not present

## 2017-09-02 DIAGNOSIS — M25561 Pain in right knee: Secondary | ICD-10-CM | POA: Diagnosis not present

## 2017-09-04 ENCOUNTER — Encounter: Payer: Self-pay | Admitting: Physician Assistant

## 2017-09-04 ENCOUNTER — Ambulatory Visit (INDEPENDENT_AMBULATORY_CARE_PROVIDER_SITE_OTHER): Payer: 59 | Admitting: Physician Assistant

## 2017-09-04 ENCOUNTER — Other Ambulatory Visit: Payer: Self-pay

## 2017-09-04 VITALS — BP 128/84 | HR 84 | Temp 98.1°F | Resp 16 | Ht 69.0 in | Wt 248.2 lb

## 2017-09-04 DIAGNOSIS — Z0000 Encounter for general adult medical examination without abnormal findings: Secondary | ICD-10-CM

## 2017-09-04 DIAGNOSIS — F17200 Nicotine dependence, unspecified, uncomplicated: Secondary | ICD-10-CM

## 2017-09-04 DIAGNOSIS — G894 Chronic pain syndrome: Secondary | ICD-10-CM

## 2017-09-04 DIAGNOSIS — R749 Abnormal serum enzyme level, unspecified: Secondary | ICD-10-CM | POA: Diagnosis not present
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
## 2017-09-06 LAB — TEST AUTHORIZATION

## 2017-09-06 LAB — CBC WITH DIFFERENTIAL/PLATELET
BASOS PCT: 1 %
Basophils Absolute: 67 cells/uL (ref 0–200)
Eosinophils Absolute: 288 cells/uL (ref 15–500)
Eosinophils Relative: 4.3 %
HEMATOCRIT: 42.8 % (ref 38.5–50.0)
HEMOGLOBIN: 14.2 g/dL (ref 13.2–17.1)
LYMPHS ABS: 2023 {cells}/uL (ref 850–3900)
MCH: 29.1 pg (ref 27.0–33.0)
MCHC: 33.2 g/dL (ref 32.0–36.0)
MCV: 87.7 fL (ref 80.0–100.0)
MPV: 12.4 fL (ref 7.5–12.5)
Monocytes Relative: 9 %
NEUTROS ABS: 3719 {cells}/uL (ref 1500–7800)
Neutrophils Relative %: 55.5 %
PLATELETS: 216 10*3/uL (ref 140–400)
RBC: 4.88 10*6/uL (ref 4.20–5.80)
RDW: 13.8 % (ref 11.0–15.0)
TOTAL LYMPHOCYTE: 30.2 %
WBC: 6.7 10*3/uL (ref 3.8–10.8)
WBCMIX: 603 {cells}/uL (ref 200–950)

## 2017-09-06 LAB — COMPLETE METABOLIC PANEL WITH GFR
AG RATIO: 1.5 (calc) (ref 1.0–2.5)
ALT: 21 U/L (ref 9–46)
AST: 19 U/L (ref 10–35)
Albumin: 4.5 g/dL (ref 3.6–5.1)
Alkaline phosphatase (APISO): 141 U/L — ABNORMAL HIGH (ref 40–115)
BUN: 17 mg/dL (ref 7–25)
CO2: 28 mmol/L (ref 20–32)
Calcium: 9.4 mg/dL (ref 8.6–10.3)
Chloride: 103 mmol/L (ref 98–110)
Creat: 0.75 mg/dL (ref 0.70–1.33)
GFR, Est African American: 121 mL/min/{1.73_m2} (ref >=60)
GFR, Est Non African American: 105 mL/min/{1.73_m2} (ref >=60)
Globulin: 3 g/dL (calc) (ref 1.9–3.7)
Glucose, Bld: 124 mg/dL — ABNORMAL HIGH (ref 65–99)
POTASSIUM: 4.9 mmol/L (ref 3.5–5.3)
Sodium: 139 mmol/L (ref 135–146)
TOTAL PROTEIN: 7.5 g/dL (ref 6.1–8.1)
Total Bilirubin: 0.3 mg/dL (ref 0.2–1.2)

## 2017-09-06 LAB — LIPID PANEL
Cholesterol: 210 mg/dL — ABNORMAL HIGH (ref <200)
HDL: 44 mg/dL (ref >40)
LDL CHOLESTEROL (CALC): 141 mg/dL — AB
NON-HDL CHOLESTEROL (CALC): 166 mg/dL — AB (ref <130)
TRIGLYCERIDES: 128 mg/dL (ref <150)
Total CHOL/HDL Ratio: 4.8 (calc) (ref <5.0)

## 2017-09-06 LAB — PSA: PSA: 0.9 ng/mL (ref <=4.0)

## 2017-09-06 LAB — TSH: TSH: 1.54 mIU/L (ref 0.40–4.50)

## 2017-09-07 LAB — HEMOGLOBIN A1C W/OUT EAG: Hgb A1c MFr Bld: 7.1 % of total Hgb — ABNORMAL HIGH (ref <5.7)

## 2017-09-11 ENCOUNTER — Ambulatory Visit (INDEPENDENT_AMBULATORY_CARE_PROVIDER_SITE_OTHER): Payer: 59 | Admitting: Physician Assistant

## 2017-09-11 ENCOUNTER — Encounter: Payer: Self-pay | Admitting: Physician Assistant

## 2017-09-11 DIAGNOSIS — R739 Hyperglycemia, unspecified: Secondary | ICD-10-CM | POA: Insufficient documentation
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
## 2017-09-15 DIAGNOSIS — M25512 Pain in left shoulder: Secondary | ICD-10-CM | POA: Diagnosis not present

## 2017-09-15 DIAGNOSIS — M25561 Pain in right knee: Secondary | ICD-10-CM | POA: Diagnosis not present

## 2017-09-21 ENCOUNTER — Ambulatory Visit (INDEPENDENT_AMBULATORY_CARE_PROVIDER_SITE_OTHER): Payer: 59 | Admitting: Physician Assistant

## 2017-09-21 ENCOUNTER — Encounter: Payer: Self-pay | Admitting: Physician Assistant

## 2017-09-21 VITALS — BP 136/88 | HR 86 | Temp 98.4°F | Resp 20 | Ht 69.0 in | Wt 237.2 lb

## 2017-09-21 DIAGNOSIS — J988 Other specified respiratory disorders: Secondary | ICD-10-CM | POA: Diagnosis not present

## 2017-09-21 DIAGNOSIS — B9689 Other specified bacterial agents as the cause of diseases classified elsewhere: Secondary | ICD-10-CM

## 2017-09-21 MED ORDER — HYDROCODONE-HOMATROPINE 5-1.5 MG/5ML PO SYRP: 5.0000 mL | ORAL_SOLUTION | Freq: Four times a day (QID) | ORAL | 0 refills | Status: DC | PRN

## 2017-09-21 MED ORDER — AZITHROMYCIN 250 MG PO TABS: ORAL_TABLET | ORAL | 0 refills | Status: DC

## 2017-09-21 NOTE — Progress Notes (Signed)
Patient ID: Stephen Roberts MRN: [id number], DOB: 05-Mar-1966, 52 y.o. Date of Encounter: 09/21/2017, 2:22 PM    Chief Complaint:  Chief Complaint  Patient presents with  . chest congestion    started last friday   . Cough  . Nasal Congestion  . Shortness of Breath  . Fatigue     HPI: 52 y.o. year old male presents with above.   States that about 10 days ago he noticed some nasal congestion.  Then 7 days ago developed chest congestion and cough and is continued with bad cough since then.  States that neither he nor his wife can get any sleep because he cannot stop coughing.  No known fevers no chills.  No significant sore throat.  No other concerns to address.  Has used some over-the-counter medications that his wife had but is not sure exactly what he has used but states that nothing is really giving him much relief.     Home Meds:   Outpatient Medications Prior to Visit  Medication Sig Dispense Refill  . amitriptyline (ELAVIL) 10 MG tablet Take 10 mg by mouth daily. TAKE 1-3 TABLETS  0  . glucosamine-chondroitin 500-400 MG tablet Take 500 tablets by mouth 3 (three) times daily. 500-400    . HYDROcodone-acetaminophen (NORCO) 10-325 MG tablet Take 1 tablet by mouth every 12 (twelve) hours as needed. for pain  0  . ibuprofen (ADVIL,MOTRIN) 200 MG tablet Take 4 tablets (800 mg total) by mouth every 8 (eight) hours as needed for mild pain or moderate pain. 30 tablet 0  . meloxicam (MOBIC) 15 MG tablet Take 15 mg by mouth daily as needed.  1  . Multiple Vitamin (MULTIVITAMIN) tablet Take 1 tablet by mouth daily.    . pantoprazole (PROTONIX) 40 MG tablet Take 1 tablet (40 mg total) by mouth daily. 30 tablet 0  . Probiotic Product (PROBIOTIC DAILY PO) Take 1 tablet by mouth daily.    . sildenafil (VIAGRA) 100 MG tablet TAKE AS DIRECTED 4 tablet 2  . vitamin C (ASCORBIC ACID) 250 MG tablet Take 250 mg by mouth daily.     No facility-administered medications prior to visit.      Allergies:  Allergies  Allergen Reactions  . Gabapentin Shortness Of Breath  . Lyrica [Pregabalin] Shortness Of Breath      Review of Systems: See HPI for pertinent ROS. All other ROS negative.    Physical Exam: Blood pressure 136/88, pulse 86, temperature 98.4 F (36.9 C), temperature source Oral, resp. rate 20, height 5\' 9"  (1.753 m), weight 107.6 kg (237 lb 3.2 oz), SpO2 98 %., Body mass index is 35.03 kg/m. General: WNWD WM.  Appears in no acute distress. HEENT: Normocephalic, atraumatic, eyes without discharge, sclera non-icteric, nares are without discharge. Bilateral auditory canals clear, TM's are without perforation, pearly grey and translucent with reflective cone of light bilaterally. Oral cavity moist, posterior pharynx without exudate, erythema, peritonsillar abscess.  No tenderness with percussion to frontal or maxillary sinuses bilaterally.  Neck: Supple. No thyromegaly. No lymphadenopathy. Lungs: Clear bilaterally to auscultation without wheezes, rales, or rhonchi. Breathing is unlabored. Heart: Regular rhythm. No murmurs, rubs, or gallops. Msk:  Strength and tone normal for age. Extremities/Skin: Warm and dry.  Neuro: Alert and oriented X 3. Moves all extremities spontaneously. Gait is normal. CNII-XII grossly in tact. Psych:  Responds to questions appropriately with a normal affect.     ASSESSMENT AND PLAN:  52 y.o. year old male with  1.  Bacterial respiratory infection He is to take antibiotic as directed.  Also can use the Hycodan as needed for his cough suppressant.  Follow-up if symptoms do not resolve within 1 week after completion of antibiotic. - azithromycin (ZITHROMAX) 250 MG tablet; Day 1: Take 2 daily. Days 2 -5: Take 1 daily.  Dispense: 6 tablet; Refill: 0 - HYDROcodone-homatropine (HYCODAN) 5-1.5 MG/5ML syrup; Take 5 mLs by mouth every 6 (six) hours as needed.  Dispense: 120 mL; Refill: 0   Signed, [address] [place], [place], [facility] 09/21/2017 2:22  PM

## 2017-09-25 ENCOUNTER — Ambulatory Visit (INDEPENDENT_AMBULATORY_CARE_PROVIDER_SITE_OTHER): Payer: 59 | Admitting: Physician Assistant

## 2017-09-25 ENCOUNTER — Encounter: Payer: Self-pay | Admitting: Physician Assistant

## 2017-09-25 VITALS — BP 134/82 | HR 98 | Temp 98.0°F | Resp 20 | Ht 69.0 in | Wt 241.2 lb

## 2017-09-25 DIAGNOSIS — J209 Acute bronchitis, unspecified: Secondary | ICD-10-CM

## 2017-09-25 DIAGNOSIS — F17200 Nicotine dependence, unspecified, uncomplicated: Secondary | ICD-10-CM

## 2017-09-25 MED ORDER — LEVOFLOXACIN 750 MG PO TABS: 750.0000 mg | ORAL_TABLET | Freq: Every day | ORAL | 0 refills | Status: AC

## 2017-09-25 MED ORDER — ALBUTEROL SULFATE HFA 108 (90 BASE) MCG/ACT IN AERS: 2.0000 | INHALATION_SPRAY | Freq: Four times a day (QID) | RESPIRATORY_TRACT | 2 refills | Status: DC | PRN

## 2017-09-25 MED ORDER — IPRATROPIUM-ALBUTEROL 0.5-2.5 (3) MG/3ML IN SOLN
3.0000 mL | Freq: Once | RESPIRATORY_TRACT | Status: AC
  Administered 2017-09-25: 3 mL via RESPIRATORY_TRACT

## 2017-09-25 MED ORDER — PREDNISONE 20 MG PO TABS: ORAL_TABLET | ORAL | 0 refills | Status: DC

## 2017-09-25 NOTE — Progress Notes (Signed)
Patient ID: ARTH NICASTRO MRN: [id number], DOB: [birth year], 52 y.o. Date of Encounter: 09/25/2017, 3:49 PM    Chief Complaint:  Chief Complaint  Patient presents with  . trouble breathing     HPI: 52 y.o. year old male presents with above.   09/21/2017: States that about 10 days ago he noticed some nasal congestion.  Then 7 days ago developed chest congestion and cough and is continued with bad cough since then.  States that neither he nor his wife can get any sleep because he cannot stop coughing.  No known fevers no chills.  No significant sore throat.  No other concerns to address.  Has used some over-the-counter medications that his wife had but is not sure exactly what he has used but states that nothing is really giving him much relief. AT THAT OV: Prescribed--Azithromycin and Hycodan   09/25/2017: Today he reports that he has been using the medications prescribed at last visit.  However says that at times he still has "coughing spells" and at that time feels like he cannot get enough air ".  Also says that he still congested in his head.  Takes in deep breath with deep inhalation and deep exhalation to show me the congestion that he is feeling deep up in his nasal, sinus region. Has had no fevers or chills.  No sore throat.  No other concerns.    Home Meds:   Outpatient Medications Prior to Visit  Medication Sig Dispense Refill  . amitriptyline (ELAVIL) 10 MG tablet Take 10 mg by mouth daily. TAKE 1-3 TABLETS  0  . azithromycin (ZITHROMAX) 250 MG tablet Day 1: Take 2 daily. Days 2 -5: Take 1 daily. 6 tablet 0  . glucosamine-chondroitin 500-400 MG tablet Take 500 tablets by mouth 3 (three) times daily. 500-400    . HYDROcodone-acetaminophen (NORCO) 10-325 MG tablet Take 1 tablet by mouth every 12 (twelve) hours as needed. for pain  0  . HYDROcodone-homatropine (HYCODAN) 5-1.5 MG/5ML syrup Take 5 mLs by mouth every 6 (six) hours as needed. 120 mL 0  . ibuprofen  (ADVIL,MOTRIN) 200 MG tablet Take 4 tablets (800 mg total) by mouth every 8 (eight) hours as needed for mild pain or moderate pain. 30 tablet 0  . meloxicam (MOBIC) 15 MG tablet Take 15 mg by mouth daily as needed.  1  . Multiple Vitamin (MULTIVITAMIN) tablet Take 1 tablet by mouth daily.    . pantoprazole (PROTONIX) 40 MG tablet Take 1 tablet (40 mg total) by mouth daily. 30 tablet 0  . Probiotic Product (PROBIOTIC DAILY PO) Take 1 tablet by mouth daily.    . sildenafil (VIAGRA) 100 MG tablet TAKE AS DIRECTED 4 tablet 2  . vitamin C (ASCORBIC ACID) 250 MG tablet Take 250 mg by mouth daily.     No facility-administered medications prior to visit.     Allergies:  Allergies  Allergen Reactions  . Gabapentin Shortness Of Breath  . Lyrica [Pregabalin] Shortness Of Breath      Review of Systems: See HPI for pertinent ROS. All other ROS negative.    Physical Exam: Blood pressure 134/82, pulse 98, temperature 98 F (36.7 C), temperature source Oral, resp. rate 20, height 5\' 9"  (1.753 m), weight 109.4 kg (241 lb 3.2 oz), SpO2 95 %., Body mass index is 35.62 kg/m. General: WM. Appears in no acute distress. Head: Normocephalic, atraumatic, eyes without discharge, sclera non-icteric, nares are without discharge. Bilateral auditory canals clear, TM's are without perforation,  pearly grey and translucent with reflective cone of light bilaterally. Oral cavity moist, posterior pharynx without exudate, erythema, peritonsillar abscess.  Neck: Supple. No thyromegaly. No lymphadenopathy. Lungs: Very slight wheeze-- scattered throughout bilaterally--otherwise, good air movement. Heart: RRR with S1 S2. No murmurs, rubs, or gallops. Musculoskeletal:  Strength and tone normal for age. Extremities/Skin: Warm and dry.  Neuro: Alert and oriented X 3. Moves all extremities spontaneously. Gait is normal. CNII-XII grossly in tact. Psych:  Responds to questions appropriately with a normal affect.      ASSESSMENT AND PLAN:  52 y.o. year old male with   1. Acute bronchitis, unspecified organism 2. Smoker Noted that he is afebrile.  Oxygen saturation 95% on room air.  Very slight wheeze scattered throughout on exam. At this time will give a DuoNeb treatment here in the office.  Change antibiotic to Levaquin and will have him take prednisone. Also will give Albuterol Inh to use if needed/as directed. Follow-up if symptoms worsen or if symptoms do not resolve with this treatment. - levofloxacin (LEVAQUIN) 750 MG tablet; Take 1 tablet (750 mg total) by mouth daily for 7 days.  Dispense: 7 tablet; Refill: 0 - predniSONE (DELTASONE) 20 MG tablet; Take 2 daily for 2 days then take 1 daily for 3 days.  Dispense: 7 tablet; Refill: 0 - albuterol (PROVENTIL HFA;VENTOLIN HFA) 108 (90 Base) MCG/ACT inhaler; Inhale 2 puffs into the lungs every 6 (six) hours as needed for wheezing or shortness of breath.  Dispense: 1 Inhaler; Refill: 2      Signed, [address] [place], [place], [facility] 09/25/2017 3:49 PM

## 2017-10-18 DIAGNOSIS — M25561 Pain in right knee: Secondary | ICD-10-CM | POA: Diagnosis not present

## 2017-10-18 DIAGNOSIS — M25512 Pain in left shoulder: Secondary | ICD-10-CM | POA: Diagnosis not present

## 2017-10-18 DIAGNOSIS — M542 Cervicalgia: Secondary | ICD-10-CM | POA: Diagnosis not present

## 2017-10-23 DIAGNOSIS — Z1211 Encounter for screening for malignant neoplasm of colon: Secondary | ICD-10-CM | POA: Diagnosis not present

## 2017-10-23 DIAGNOSIS — Z1212 Encounter for screening for malignant neoplasm of rectum: Secondary | ICD-10-CM | POA: Diagnosis not present

## 2017-10-23 LAB — COLOGUARD: Cologuard: NEGATIVE

## 2017-11-07 DIAGNOSIS — M5412 Radiculopathy, cervical region: Secondary | ICD-10-CM | POA: Diagnosis not present

## 2017-11-07 DIAGNOSIS — M542 Cervicalgia: Secondary | ICD-10-CM | POA: Diagnosis not present

## 2017-11-08 ENCOUNTER — Telehealth: Payer: Self-pay

## 2017-11-08 NOTE — Telephone Encounter (Signed)
Call placed to patient to inform him of his negative cologuard results. Patient was not avail will call again later.

## 2017-11-08 NOTE — Telephone Encounter (Signed)
Patient called and received his cologuard results

## 2017-11-13 ENCOUNTER — Other Ambulatory Visit: Payer: Self-pay | Admitting: Physical Medicine and Rehabilitation

## 2017-11-13 DIAGNOSIS — M542 Cervicalgia: Secondary | ICD-10-CM

## 2017-11-18 ENCOUNTER — Ambulatory Visit
Admission: RE | Admit: 2017-11-18 | Discharge: 2017-11-18 | Disposition: A | Discharge status: Home / Self Care | Payer: 59 | Source: Ambulatory Visit | Attending: Physical Medicine and Rehabilitation | Admitting: Physical Medicine and Rehabilitation

## 2017-11-18 ENCOUNTER — Other Ambulatory Visit: Payer: Self-pay | Admitting: Physical Medicine and Rehabilitation

## 2017-11-18 DIAGNOSIS — M4802 Spinal stenosis, cervical region: Secondary | ICD-10-CM | POA: Diagnosis not present

## 2017-11-18 DIAGNOSIS — M542 Cervicalgia: Secondary | ICD-10-CM

## 2017-11-18 MED ORDER — GADOBENATE DIMEGLUMINE 529 MG/ML IV SOLN: 20.0000 mL | Freq: Once | INTRAVENOUS | Status: DC | PRN

## 2017-11-28 DIAGNOSIS — M542 Cervicalgia: Secondary | ICD-10-CM | POA: Diagnosis not present

## 2017-11-28 DIAGNOSIS — M5412 Radiculopathy, cervical region: Secondary | ICD-10-CM | POA: Diagnosis not present

## 2017-12-08 DIAGNOSIS — M25561 Pain in right knee: Secondary | ICD-10-CM | POA: Diagnosis not present

## 2017-12-11 ENCOUNTER — Ambulatory Visit (INDEPENDENT_AMBULATORY_CARE_PROVIDER_SITE_OTHER): Payer: 59 | Admitting: Physician Assistant

## 2017-12-11 ENCOUNTER — Other Ambulatory Visit: Payer: Self-pay

## 2017-12-11 ENCOUNTER — Encounter: Payer: Self-pay | Admitting: Physician Assistant

## 2017-12-11 VITALS — BP 134/88 | HR 84 | Temp 97.5°F | Resp 16 | Ht 69.0 in | Wt 241.8 lb

## 2017-12-11 DIAGNOSIS — R739 Hyperglycemia, unspecified: Secondary | ICD-10-CM
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
## 2017-12-12 ENCOUNTER — Other Ambulatory Visit: Payer: Self-pay

## 2017-12-12 LAB — HEMOGLOBIN A1C
Hgb A1c MFr Bld: 7.2 % of total Hgb — ABNORMAL HIGH (ref <5.7)
MEAN PLASMA GLUCOSE: 160 (calc)
eAG (mmol/L): 8.9 (calc)

## 2017-12-12 MED ORDER — METFORMIN HCL 500 MG PO TABS: 500.0000 mg | ORAL_TABLET | Freq: Two times a day (BID) | ORAL | 5 refills | Status: DC

## 2017-12-21 DIAGNOSIS — M5412 Radiculopathy, cervical region: Secondary | ICD-10-CM | POA: Diagnosis not present

## 2017-12-21 DIAGNOSIS — M542 Cervicalgia: Secondary | ICD-10-CM | POA: Diagnosis not present

## 2017-12-28 ENCOUNTER — Other Ambulatory Visit: Payer: Self-pay | Admitting: Physician Assistant

## 2017-12-28 DIAGNOSIS — J209 Acute bronchitis, unspecified: Secondary | ICD-10-CM

## 2018-01-01 DIAGNOSIS — M542 Cervicalgia: Secondary | ICD-10-CM | POA: Diagnosis not present

## 2018-01-01 DIAGNOSIS — M5412 Radiculopathy, cervical region: Secondary | ICD-10-CM | POA: Diagnosis not present

## 2018-02-08 DIAGNOSIS — M5412 Radiculopathy, cervical region: Secondary | ICD-10-CM | POA: Diagnosis not present

## 2018-02-12 DIAGNOSIS — M25561 Pain in right knee: Secondary | ICD-10-CM | POA: Diagnosis not present

## 2018-02-15 DIAGNOSIS — M1711 Unilateral primary osteoarthritis, right knee: Secondary | ICD-10-CM | POA: Diagnosis not present

## 2018-02-20 ENCOUNTER — Other Ambulatory Visit: Payer: Self-pay | Admitting: Neurosurgery

## 2018-02-20 DIAGNOSIS — M5412 Radiculopathy, cervical region: Secondary | ICD-10-CM

## 2018-03-01 ENCOUNTER — Ambulatory Visit
Admission: RE | Admit: 2018-03-01 | Discharge: 2018-03-01 | Disposition: A | Discharge status: Home / Self Care | Payer: 59 | Source: Ambulatory Visit | Attending: Neurosurgery | Admitting: Neurosurgery

## 2018-03-01 ENCOUNTER — Telehealth: Payer: Self-pay

## 2018-03-01 DIAGNOSIS — M4322 Fusion of spine, cervical region: Secondary | ICD-10-CM | POA: Diagnosis not present

## 2018-03-01 DIAGNOSIS — M1711 Unilateral primary osteoarthritis, right knee: Secondary | ICD-10-CM | POA: Diagnosis not present

## 2018-03-01 DIAGNOSIS — M5412 Radiculopathy, cervical region: Secondary | ICD-10-CM

## 2018-03-01 NOTE — Telephone Encounter (Signed)
Received fax from [place] stating patient has multiple rescue inhaler fills without a controller medication at [facility] in [facility] last 180 days. [facility] would like to know if it is appropriate to start a daily asthma controller therapy, If so they would like a rx

## 2018-03-04 NOTE — Telephone Encounter (Signed)
I need to see this patient to establish with him to determine the next best course of therapy.

## 2018-03-07 NOTE — Telephone Encounter (Signed)
Patient was scheduled to see Delsa Grana  on 03/14/2018

## 2018-03-08 DIAGNOSIS — M1711 Unilateral primary osteoarthritis, right knee: Secondary | ICD-10-CM | POA: Diagnosis not present

## 2018-03-14 ENCOUNTER — Ambulatory Visit: Payer: 59 | Admitting: Physician Assistant

## 2018-03-14 ENCOUNTER — Encounter: Payer: Self-pay | Admitting: Family Medicine

## 2018-03-14 ENCOUNTER — Ambulatory Visit (INDEPENDENT_AMBULATORY_CARE_PROVIDER_SITE_OTHER): Payer: 59 | Admitting: Family Medicine

## 2018-03-14 VITALS — BP 150/98 | HR 76 | Temp 97.6°F | Resp 12 | Ht 69.0 in | Wt 231.2 lb

## 2018-03-14 DIAGNOSIS — M549 Dorsalgia, unspecified: Secondary | ICD-10-CM | POA: Diagnosis not present

## 2018-03-14 DIAGNOSIS — E1169 Type 2 diabetes mellitus with other specified complication: Secondary | ICD-10-CM | POA: Insufficient documentation

## 2018-03-14 DIAGNOSIS — G8929 Other chronic pain: Secondary | ICD-10-CM | POA: Diagnosis not present

## 2018-03-14 DIAGNOSIS — E119 Type 2 diabetes mellitus without complications: Secondary | ICD-10-CM | POA: Insufficient documentation

## 2018-03-14 DIAGNOSIS — R739 Hyperglycemia, unspecified: Secondary | ICD-10-CM | POA: Diagnosis not present

## 2018-03-14 MED ORDER — TRAMADOL HCL 50 MG PO TABS: 50.0000 mg | ORAL_TABLET | Freq: Two times a day (BID) | ORAL | 0 refills | Status: DC | PRN

## 2018-03-14 NOTE — Assessment & Plan Note (Addendum)
New to my pt panel - pt appears to be on metformin 500 mg BID, A1C elevated July 2019, here for recheck Hb A1C, brings no sugar log, no SE from meds, no high or low CBG, pt does not check very often. He's been working on diet and exercise, lost some weight Recheck A1C and then see if meds need to be adjusted  Will need to review chart to see if other standards of care for DM are needed or done already

## 2018-03-14 NOTE — Patient Instructions (Signed)
Will call you with labs and will decide if we need to change medications or if numbers are improving  Try pain meds sparingly at night.    I will look up the Duexis and see if there are more affordable alternatives.  Can continue for now, and continue tylenol. To start pain management from our office, we may need to see you in one month to reevaluate before multiple prescriptions and/or refills are ordered.  There are new laws dictating this for your safety.  There is risk of dependence, tolerance, addiction, respiratory depression with narcotic pain medication.    Continue your efforts with diet.  I hope you are able to get better sleep

## 2018-03-14 NOTE — Progress Notes (Signed)
Patient ID: Stephen Roberts, male    DOB: 02/19/1966, 52 y.o.   MRN: [id number]  PCP: Delsa Grana, PA-C  Chief Complaint  Patient presents with  . 3 month follow up    pain in shoulder     Subjective:   Stephen Roberts is a 52 y.o. male, presents to clinic with CC of diabetes and also chronic neck and thoracic back pain, like help with pain management.  He was seen by his PCP, Karis Juba, 3 months ago and 6 months ago for new dx of T2DM, April 2019 A1c was 7.1.  He was started on metformin 500 mg BID and he has been working diligently on dietary changes and today he reports 16 pound weight loss.  He had no side effects from metformin medication including no abdominal pain, no diarrhea.  He is here to repeat lab work, he is not fasting.  He also complains about multiple areas of pain including right knee, neck back and left shoulder he has multiple specialists involved in his care, couple years ago had few surgeries to his back.  He is currently on Duexis (ibuprofen and famotidine) but the prescription is extremely expensive he states it has thousands of dollars a month and he has to take it 3 times a day he does not believe he can continue to pay for this for much longer.  He is also taking maximum doses of over-the-counter Tylenol.  He complains of pain that is much worse at night and when he is lying flat, reports that at least 5 nights a week the pain in his neck and upper back wakes him up from his sleep, he only gets a few hours of sleep even if laying down trying to sleep for more than 12 hours.  Not had narcotic pain medication since earlier this year, April where he had a hydrocodone which lasted him several months.  He does not like the way the narcotic pain medication makes him feel during the day and so he takes it sparingly at night.  He also has tried amitriptyline which change his pain at all.  He states that most of the specialist have referred him back to his primary care  provider to do pain management.    Patient Active Problem List   Diagnosis Date Noted  . Hyperglycemia 09/11/2017  . Dyspnea 04/28/2017  . Chest pain 04/28/2017  . Chronic pain syndrome 11/10/2014  . Smoker 07/30/2014  . Allergic rhinitis 07/30/2014  . Allergic dermatitis 07/30/2014  . Prostate cancer screening 07/30/2014  . Screening for colorectal cancer 07/30/2014  . Incarcerated epigastric hernia 01/11/2013  . Incarcerated umbilical hernia [id number]/1308  . Obesity, unspecified 01/11/2013  . Tobacco use disorder 01/11/2013    Current Meds  Medication Sig  . albuterol (PROVENTIL HFA;VENTOLIN HFA) 108 (90 Base) MCG/ACT inhaler TAKE 2 PUFFS BY MOUTH EVERY 6 HOURS AS NEEDED FOR WHEEZE OR SHORTNESS OF BREATH  . amitriptyline (ELAVIL) 10 MG tablet Take 10 mg by mouth daily. TAKE 1-3 TABLETS  . DUEXIS 800-26.6 MG TABS Take 1 tablet by mouth 3 (three) times daily as needed. for pain  . glucosamine-chondroitin 500-400 MG tablet Take 500 tablets by mouth 3 (three) times daily. 500-400  . HYDROcodone-acetaminophen (NORCO) 10-325 MG tablet Take 1 tablet by mouth every 12 (twelve) hours as needed. for pain  . metFORMIN (GLUCOPHAGE) 500 MG tablet Take 1 tablet (500 mg total) by mouth 2 (two) times daily with a meal.  .  Multiple Vitamin (MULTIVITAMIN) tablet Take 1 tablet by mouth daily.  . pantoprazole (PROTONIX) 40 MG tablet Take 1 tablet (40 mg total) by mouth daily.  . predniSONE (DELTASONE) 20 MG tablet Take 2 daily for 2 days then take 1 daily for 3 days.  . Probiotic Product (PROBIOTIC DAILY PO) Take 1 tablet by mouth daily.  . sildenafil (VIAGRA) 100 MG tablet TAKE AS DIRECTED  . vitamin C (ASCORBIC ACID) 250 MG tablet Take 250 mg by mouth daily.     Review of Systems  Constitutional: Negative.   HENT: Negative.   Eyes: Negative.   Respiratory: Negative.   Cardiovascular: Negative.   Gastrointestinal: Negative.   Endocrine: Negative.   Genitourinary: Negative.     Musculoskeletal: Negative.   Skin: Negative.   Allergic/Immunologic: Negative.   Neurological: Negative.   Hematological: Negative.   Psychiatric/Behavioral: Negative.   All other systems reviewed and are negative.      Objective:    Vitals:   03/14/18 1015  BP: (!) 150/98  Pulse: 76  Resp: 12  Temp: 97.6 F (36.4 C)  TempSrc: Oral  SpO2: 98%  Weight: 231 lb 3.2 oz (104.9 kg)  Height: 5\' 9"  (1.753 m)      Physical Exam  Constitutional: He appears well-developed and well-nourished. No distress.  HENT:  Head: Normocephalic and atraumatic.  Nose: Nose normal.  Mouth/Throat: Oropharynx is clear and moist.  Eyes: Pupils are equal, round, and reactive to light. Conjunctivae are normal. Right eye exhibits no discharge. Left eye exhibits no discharge. No scleral icterus.  Neck: Trachea normal and phonation normal. No spinous process tenderness present. Neck rigidity present. Decreased range of motion present. No tracheal deviation present.  Cardiovascular: Normal rate, regular rhythm, normal heart sounds and intact distal pulses. Exam reveals no friction rub.  No murmur heard. Pulmonary/Chest: Effort normal and breath sounds normal. No stridor. No respiratory distress. He has no wheezes. He has no rales.  Abdominal: Soft. Bowel sounds are normal. He exhibits no distension and no mass. There is no tenderness. There is no guarding.  Musculoskeletal: He exhibits tenderness. He exhibits no deformity.  Neurological: He is alert. He exhibits normal muscle tone. Coordination normal.  Skin: Skin is warm. Capillary refill takes less than 2 seconds. No rash noted. He is not diaphoretic.  Psychiatric: He has a normal mood and affect. His behavior is normal.  Nursing note and vitals reviewed.         Assessment & Plan:   Problem List Items Addressed This Visit      Endocrine   Type 2 diabetes mellitus ([place]) - Primary    New to my pt panel - pt appears to be on metformin 500 mg  BID, A1C elevated July 2019, here for recheck Hb A1C, brings no sugar log, no SE from meds, no high or low CBG, pt does not check very often. He's been working on diet and exercise, lost some weight Recheck A1C and then see if meds need to be adjusted  Will need to review chart to see if other standards of care for DM are needed or done already      Relevant Orders   Hemoglobin A1c    Other Visit Diagnoses    Chronic bilateral back pain, unspecified back location       Relevant Medications   DUEXIS 800-26.6 MG TABS   traMADol (ULTRAM) 50 MG tablet       Quickly reviewing the chart that, I did add the diagnosis  of type 2 diabetes, A1c was 7.1, will repeat that today, patient was not fasting so I cannot repeat other labs, 6 months ago he did have hyperlipidemia, with a diagnosis of DM he does need treatment with a statin and kidney protection with ACEI or ARB, do not see these things on his chart and I do not know if they have been addressed.    Also will need eye exam and foot exam  He has been dealing with a lot of other life changes including disability, inability work, chronic pain and multiple surgeries and seeing multiple specialists without any significant improvement and he is very tearful in the exam room.  Have reviewed controlled substance database for narcotic prescriptions and use, will see if tramadol 50 to 100 mg 1-2 times a day will help control his pain better or at least allow him to get some sleep.  I will need to review his chart and see what other specialist have been doing for him or plan to do, he also may benefit from physiatry.  Discussed at length the precautions with narcotic pain medication including tolerance, addiction, respiratory pression, Positive take effect with narcotic pain medication prescribing, this is been printed for him as well and patient understands to use sparingly, breaks to avoid buildup of tolerance, he also understands that it may not be the best  long-term option for treating his pain however he does have some problems with GERD and gastritis, of current medications with NSAIDs and acid blocking medicine are extremely expensive and he cannot afford them, so I will look into other stomach protection with NSAID use and see what else I can do for him.    Follow up in 1 month for chronic pain management recheck Pt likely needs repeated fasting labs now, but is not fasting, will see if he will come again fasting in the next week, if he refuses will have fasting labs ordered for future f/up appt, 3 month recheck of DM and HLD  Also pt noted to have elevated BP here today, but is significant pain, had to keep moving and adjusting, will recheck at next appt.  Delsa Grana, PA-C 03/14/18 10:36 AM

## 2018-03-15 LAB — HEMOGLOBIN A1C
EAG (MMOL/L): 7.4 (calc)
Hgb A1c MFr Bld: 6.3 % of total Hgb — ABNORMAL HIGH (ref <5.7)
MEAN PLASMA GLUCOSE: 134 (calc)

## 2018-03-20 DIAGNOSIS — M5412 Radiculopathy, cervical region: Secondary | ICD-10-CM | POA: Diagnosis not present

## 2018-04-17 ENCOUNTER — Ambulatory Visit (INDEPENDENT_AMBULATORY_CARE_PROVIDER_SITE_OTHER): Payer: 59 | Admitting: Family Medicine

## 2018-04-17 ENCOUNTER — Encounter: Payer: Self-pay | Admitting: Family Medicine

## 2018-04-17 VITALS — BP 118/64 | HR 49 | Temp 98.5°F | Resp 16 | Ht 69.0 in | Wt 232.2 lb

## 2018-04-17 DIAGNOSIS — M549 Dorsalgia, unspecified: Secondary | ICD-10-CM | POA: Diagnosis not present

## 2018-04-17 DIAGNOSIS — G8929 Other chronic pain: Secondary | ICD-10-CM | POA: Diagnosis not present

## 2018-04-17 DIAGNOSIS — E119 Type 2 diabetes mellitus without complications: Secondary | ICD-10-CM | POA: Diagnosis not present

## 2018-04-17 DIAGNOSIS — E785 Hyperlipidemia, unspecified: Secondary | ICD-10-CM

## 2018-04-17 MED ORDER — SIMVASTATIN 20 MG PO TABS: 20.0000 mg | ORAL_TABLET | Freq: Every day | ORAL | 3 refills | Status: DC

## 2018-04-17 MED ORDER — OXYCODONE-ACETAMINOPHEN 5-325 MG PO TABS: 1.0000 | ORAL_TABLET | Freq: Three times a day (TID) | ORAL | 0 refills | Status: DC | PRN

## 2018-04-17 NOTE — Patient Instructions (Signed)
Come back in as soon as you can for fasting lab work.  You need to start a medication for cholesterol - take it at night and we need to follow up in one month.   If you do surgery you will need a clearance physical, so addressing these things will help you get cleared for surgery  Percocet has been prescribed.  Take sparingly.  When taking a pain pill you need to limit the tylenol (at least omit one tylenol when taking percocet because it has tylenol in it).

## 2018-04-17 NOTE — Progress Notes (Signed)
Patient ID: Stephen Roberts, male    DOB: May 09, 1966, 52 y.o.   MRN: [id number]  PCP: Delsa Grana, PA-C  Chief Complaint  Patient presents with  . Pain    Patient in today for pain management. States he has back pain from previous back surgery, shoulder pain, and knee pain    Subjective:   Stephen Roberts is a 52 y.o. male, presents to clinic with CC of continued neck and back pain, chronic, he has worsening pain over the past couple months with the lower temperatures.  He did go to a neurosurgeon to get a second opinion and he was recommended to have surgery immediately because of stenosis.  Patient states that he has severe pain in the neck radiates to his shoulders and sometimes into his arms.  He does have some weakness.  His pain gets much worse at night, he continues to use over-the-counter medication such as Tylenol ibuprofen and the prescription ibuprofen with the famotidine that was given to him previously by spinal specialist.  He did try the tramadol but it did not do anything for his pain so after trying it a few times he disposed of it, I did prescribe that for him several months ago.  He is considering surgery and currently discussing this with the new neurosurgeon and with his wife.  He was due for fasting labs to recheck diabetes and cholesterol but he is not fasting today and he was not aware that he needed to do these.  He is tolerating his diabetes medications, states he is compliant with 500 mg metformin twice daily, denies any side effects.     Patient Active Problem List   Diagnosis Date Noted  . Type 2 diabetes mellitus ([place]) 03/14/2018  . Hyperglycemia 09/11/2017  . Cervical disc disorder with radiculopathy, high cervical region 05/30/2017  . Dyspnea 04/28/2017  . Chest pain 04/28/2017  . Chronic pain syndrome 11/10/2014  . Smoker 07/30/2014  . Allergic rhinitis 07/30/2014  . Allergic dermatitis 07/30/2014  . Prostate cancer screening 07/30/2014  .  Screening for colorectal cancer 07/30/2014  . Incarcerated epigastric hernia 01/11/2013  . Incarcerated umbilical hernia 23/30/0762  . Obesity, unspecified 01/11/2013  . Tobacco use disorder 01/11/2013     Prior to Admission medications   Medication Sig Start Date End Date Taking? Authorizing Provider  albuterol (PROVENTIL HFA;VENTOLIN HFA) 108 (90 Base) MCG/ACT inhaler TAKE 2 PUFFS BY MOUTH EVERY 6 HOURS AS NEEDED FOR WHEEZE OR SHORTNESS OF BREATH 12/28/17  Yes Dixon, Mary B, PA-C  DUEXIS 800-26.6 MG TABS Take 1 tablet by mouth 3 (three) times daily as needed. for pain 03/09/18  Yes [provider]  glucosamine-chondroitin 500-400 MG tablet Take 500 tablets by mouth 3 (three) times daily. 500-400   Yes [provider]  metFORMIN (GLUCOPHAGE) 500 MG tablet Take 1 tablet (500 mg total) by mouth 2 (two) times daily with a meal. 12/12/17  Yes Dena Billet B, PA-C  Multiple Vitamin (MULTIVITAMIN) tablet Take 1 tablet by mouth daily.   Yes [provider]  pantoprazole (PROTONIX) 40 MG tablet Take 1 tablet (40 mg total) by mouth daily. 04/30/17  Yes Kathie Dike, MD  Probiotic Product (PROBIOTIC DAILY PO) Take 1 tablet by mouth daily.   Yes [provider]  sildenafil (VIAGRA) 100 MG tablet TAKE AS DIRECTED 03/13/17  Yes Dena Billet B, PA-C  vitamin C (ASCORBIC ACID) 250 MG tablet Take 250 mg by mouth daily.   Yes [provider]  traMADol (ULTRAM) 50 MG tablet Take 1-2 tablets (50-100 mg total) by mouth every 12 (twelve) hours as needed for severe pain. Patient not taking: Reported on 04/17/2018 03/14/18   Delsa Grana, PA-C     Allergies  Allergen Reactions  . Gabapentin Shortness Of Breath  . Lyrica [Pregabalin] Shortness Of Breath     Family History  Problem Relation Age of Onset  . Depression Mother   . Hearing loss Mother   . Hyperlipidemia Mother   . Hypertension Mother   . Stroke Mother   . Early death Father   . Diabetes Brother        Social History   Socioeconomic History  . Marital status: Married    Spouse name: Not on file  . Number of children: Not on file  . Years of education: Not on file  . Highest education level: Not on file  Occupational History  . Not on file  Social Needs  . Financial resource strain: Not on file  . Food insecurity:    Worry: Not on file    Inability: Not on file  . Transportation needs:    Medical: Not on file    Non-medical: Not on file  Tobacco Use  . Smoking status: Current Every Day Smoker    Packs/day: 0.50    Years: 30.00    Pack years: 15.00    Types: Cigarettes  . Smokeless tobacco: Never Used  Substance and Sexual Activity  . Alcohol use: Yes    Comment: drinks moderately once a week.  . Drug use: No  . Sexual activity: Yes  Lifestyle  . Physical activity:    Days per week: Not on file    Minutes per session: Not on file  . Stress: Not on file  Relationships  . Social connections:    Talks on phone: Not on file    Gets together: Not on file    Attends religious service: Not on file    Active member of club or organization: Not on file    Attends meetings of clubs or organizations: Not on file    Relationship status: Not on file  . Intimate partner violence:    Fear of current or ex partner: Not on file    Emotionally abused: Not on file    Physically abused: Not on file    Forced sexual activity: Not on file  Other Topics Concern  . Not on file  Social History Narrative  . Not on file     Review of Systems  Constitutional: Negative.   HENT: Negative.   Eyes: Negative.   Respiratory: Negative.   Cardiovascular: Negative.   Gastrointestinal: Negative.   Endocrine: Negative.   Genitourinary: Negative.   Musculoskeletal: Negative.   Skin: Negative.   Allergic/Immunologic: Negative.   Neurological: Negative.   Hematological: Negative.   Psychiatric/Behavioral: Negative.   All other systems reviewed and are negative.      Objective:     Vitals:   04/17/18 0823  BP: 118/64  Pulse: (!) 49  Resp: 16  Temp: 98.5 F (36.9 C)  TempSrc: Oral  SpO2: 97%  Weight: 232 lb 4 oz (105.3 kg)  Height: 5\' 9"  (1.753 m)      Physical Exam  Constitutional: He appears well-developed and well-nourished. No distress.  HENT:  Head: Normocephalic and atraumatic.  Nose: Nose normal.  Eyes: Conjunctivae are normal. Right eye exhibits no discharge. Left eye exhibits no discharge.  Neck: Phonation normal. No spinous process  tenderness and no muscular tenderness present. Neck rigidity present. Decreased range of motion present. No tracheal deviation, no edema and no erythema present.  Cardiovascular: Normal rate and regular rhythm.  Pulmonary/Chest: Effort normal. No stridor. No respiratory distress.  Neurological: He is alert. He exhibits normal muscle tone. Coordination normal.  Skin: Skin is warm and dry. No rash noted. He is not diaphoretic.  Psychiatric: He has a normal mood and affect. His behavior is normal.  Nursing note and vitals reviewed.         Assessment & Plan:      ICD-10-CM   1. Chronic bilateral back pain, unspecified back location M54.9 oxyCODONE-acetaminophen (PERCOCET/ROXICET) 5-325 MG tablet   G89.29   2. Type 2 diabetes mellitus without complication, without long-term current use of insulin (HCC) K35.4 COMPLETE METABOLIC PANEL WITH GFR    Lipid panel    Microalbumin, urine    COMPLETE METABOLIC PANEL WITH GFR    Lipid panel    Microalbumin, urine  3. Dyslipidemia S56.8 COMPLETE METABOLIC PANEL WITH GFR    Lipid panel    COMPLETE METABOLIC PANEL WITH GFR    Lipid panel    Microalbumin, urine    simvastatin (ZOCOR) 20 MG tablet    Patient returns to discuss worsening of chronic neck pain, tramadol which was prescribed March 14, 2018 was not effective at all so he did not take it.  He has had worsening of pain with colder weather.    Controlled substance database was checked and did verify that he  did fill tramadol and has had no other controlled substances or pain medications in several months.  Will do Percocets to take sparingly for severe pain at night.  I did encourage him to discuss his surgical concerns with the neurosurgeon and make a decision.  He would have his surgery covered if he did it by the end of the year and I did explain to him that we would have to do preop clearance physical and some lab work, he was encouraged to start taking the simvastatin for cholesterol with known and actively treated diagnosis of diabetes, encouraged to follow-up as soon as he can and obtain recheck of fasting CMP and lipid panel.   Delsa Grana, PA-C 04/17/18 9:01 AM

## 2018-04-19 ENCOUNTER — Other Ambulatory Visit: Payer: 59

## 2018-04-23 ENCOUNTER — Other Ambulatory Visit: Payer: 59

## 2018-05-18 DIAGNOSIS — E291 Testicular hypofunction: Secondary | ICD-10-CM | POA: Diagnosis not present

## 2018-05-18 DIAGNOSIS — R972 Elevated prostate specific antigen [PSA]: Secondary | ICD-10-CM | POA: Diagnosis not present

## 2018-05-18 DIAGNOSIS — E559 Vitamin D deficiency, unspecified: Secondary | ICD-10-CM | POA: Diagnosis not present

## 2018-05-25 ENCOUNTER — Other Ambulatory Visit: Payer: 59

## 2018-06-08 ENCOUNTER — Other Ambulatory Visit: Payer: Self-pay | Admitting: *Deleted

## 2018-06-08 MED ORDER — METFORMIN HCL 500 MG PO TABS: 500.0000 mg | ORAL_TABLET | Freq: Two times a day (BID) | ORAL | 5 refills | Status: DC

## 2018-06-21 DIAGNOSIS — J019 Acute sinusitis, unspecified: Secondary | ICD-10-CM | POA: Diagnosis not present

## 2018-10-02 DIAGNOSIS — A499 Bacterial infection, unspecified: Secondary | ICD-10-CM | POA: Diagnosis not present

## 2018-11-24 DIAGNOSIS — J0100 Acute maxillary sinusitis, unspecified: Secondary | ICD-10-CM | POA: Diagnosis not present

## 2019-02-09 DIAGNOSIS — J0101 Acute recurrent maxillary sinusitis: Secondary | ICD-10-CM | POA: Diagnosis not present

## 2019-02-13 ENCOUNTER — Ambulatory Visit: Payer: BC Managed Care – PPO | Admitting: Family Medicine

## 2019-02-13 ENCOUNTER — Other Ambulatory Visit: Payer: Self-pay

## 2019-02-13 ENCOUNTER — Encounter: Payer: Self-pay | Admitting: Family Medicine

## 2019-02-13 VITALS — BP 140/82 | HR 88 | Temp 98.6°F | Resp 14 | Ht 69.0 in | Wt 230.0 lb

## 2019-02-13 DIAGNOSIS — G894 Chronic pain syndrome: Secondary | ICD-10-CM

## 2019-02-13 DIAGNOSIS — J3489 Other specified disorders of nose and nasal sinuses: Secondary | ICD-10-CM

## 2019-02-13 DIAGNOSIS — E119 Type 2 diabetes mellitus without complications: Secondary | ICD-10-CM | POA: Diagnosis not present

## 2019-02-13 DIAGNOSIS — Z23 Encounter for immunization: Secondary | ICD-10-CM

## 2019-02-13 DIAGNOSIS — J329 Chronic sinusitis, unspecified: Secondary | ICD-10-CM | POA: Diagnosis not present

## 2019-02-13 DIAGNOSIS — E785 Hyperlipidemia, unspecified: Secondary | ICD-10-CM

## 2019-02-13 MED ORDER — TRAMADOL HCL 50 MG PO TABS: 50.0000 mg | ORAL_TABLET | Freq: Three times a day (TID) | ORAL | 0 refills | Status: AC | PRN

## 2019-02-13 MED ORDER — METFORMIN HCL 500 MG PO TABS: 500.0000 mg | ORAL_TABLET | Freq: Two times a day (BID) | ORAL | 5 refills | Status: DC

## 2019-02-13 MED ORDER — DOXYCYCLINE HYCLATE 100 MG PO TABS: 100.0000 mg | ORAL_TABLET | Freq: Two times a day (BID) | ORAL | 0 refills | Status: DC

## 2019-02-13 NOTE — Progress Notes (Signed)
Subjective:    Patient ID: Stephen Roberts, male    DOB: 06-07-1965, 53 y.o.   MRN: [id number]  Patient presents for Sinus Issues (swollen glands in neck- hasbeen on ABTx x4 recently)    Pt has had recurrent nasal redness, crusting and sores in nose, sinus presure had occurred 4 times since Jan  He has been treated multiple times by telemedicine with his insurance, most recently over the weekend  He does clear up after for 5 days on the antibiotics and feels well but it continues to recur.  He does have sinus pressure he points to his ethmoid region in the frontal sinuses.  This last time he was given Bactroban and that is well to help significantly.  He was also given doxycycline 100mg   but only given 7 tablets - so he took for 3 days and is now out  . I think this was an error He was also given allegra, also using Afrin    DM- doe not check CBG- last A1c a YEAR AGO - over due for labs     6.3% , taking metformin 500mg  in the morning, when he was taking twice a day states that it made him sick.  He does not have a glucometer at home Hyperlipidemia -zocor  was noted on his chart but he has not been taking this.    He has chronic neck pain - he takes Ibuprofen all th etime, he has had ultram/percocet , requested refill on Ultram   He has left torn rotator cuff, has OA and hole in cartilage in right jnee  History of multiple back surgeries and neck surgery, last done in 2019 States they want to put a cage in his back    Dr. Patrice Paradise / Dr. Trenton Gammon     Dr. Thea Alken - seen for shoulder [place]     Review Of Systems:  GEN- denies fatigue, fever, weight loss,weakness, recent illness HEENT- denies eye drainage, change in vision, +nasal discharge, CVS- denies chest pain, palpitations RESP- denies SOB, cough, wheeze ABD- denies N/V, change in stools, abd pain GU- denies dysuria, hematuria, dribbling, incontinence MSK- + joint pain, muscle aches, injury Neuro- denies headache, dizziness, syncope,  seizure activity       Objective:    BP 140/82   Pulse 88   Temp 98.6 F (37 C) (Oral)   Resp 14   Ht 5\' 9"  (1.753 m)   Wt 230 lb (104.3 kg)   SpO2 97%   BMI 33.97 kg/m  GEN- NAD, alert and oriented x3 HEENT- PERRL, EOMI, non injected sclera, pink conjunctiva, MMM, oropharynx clear , TM clear bilat no effusion,  +frontal  sinus tenderness, inflammed turbinates,  Nasal drainage  Neck- Supple, no LAD CVS- RRR, no murmur RESP-CTAB EXT- No edema Pulses- Radial 2+          Assessment & Plan:      Problem List Items Addressed This Visit      Unprioritized   Chronic pain syndrome    Chronic pain, multiple surgeries Refilled ultram      Relevant Medications   traMADol (ULTRAM) 50 MG tablet   Hyperlipidemia   Relevant Orders   Lipid panel   Type 2 diabetes mellitus (HCC) - Primary    New meter to be sent to pharmacy, check CBG BID Continue metformin Goal A1C less than 75 Pt not on ACE/ARB or statin      Relevant Medications   metFORMIN (GLUCOPHAGE) 500 MG tablet  Other Relevant Orders   CBC with Differential/Platelet   Comprehensive metabolic panel   Hemoglobin A1c    Other Visit Diagnoses    Recurrent sinus infections       Treated antibiotics 4x, concern for chronic sinusitis, obtain CT max/face, change doxy to BID for 7 days, D/C afrin causing rebound problems, can use nasal sali   Relevant Medications   fexofenadine (ALLEGRA) 180 MG tablet   doxycycline (VIBRA-TABS) 100 MG tablet   Nasal sore       Need for immunization against influenza       Relevant Orders   Flu Vaccine QUAD 36+ mos IM (Completed)      Note: This dictation was prepared with Dragon dictation along with smaller phrase technology. Any transcriptional errors that result from this process are unintentional.

## 2019-02-13 NOTE — Assessment & Plan Note (Signed)
Chronic pain, multiple surgeries Refilled ultram

## 2019-02-13 NOTE — Assessment & Plan Note (Signed)
New meter to be sent to pharmacy, check CBG BID Continue metformin Goal A1C less than 75 Pt not on ACE/ARB or statin

## 2019-02-13 NOTE — Patient Instructions (Addendum)
We will call with lab results  CT scan to be done of the sinuses Take the antibiotics Stop the afrin We will call with lab results  F/U schedule a physical

## 2019-02-14 ENCOUNTER — Telehealth: Payer: Self-pay | Admitting: *Deleted

## 2019-02-14 LAB — CBC WITH DIFFERENTIAL/PLATELET
Absolute Monocytes: 924 cells/uL (ref 200–950)
Basophils Absolute: 59 cells/uL (ref 0–200)
Basophils Relative: 0.9 %
Eosinophils Absolute: 238 cells/uL (ref 15–500)
Eosinophils Relative: 3.6 %
HCT: 38.9 % (ref 38.5–50.0)
Hemoglobin: 12.1 g/dL — ABNORMAL LOW (ref 13.2–17.1)
Lymphs Abs: 2310 cells/uL (ref 850–3900)
MCH: 25 pg — ABNORMAL LOW (ref 27.0–33.0)
MCHC: 31.1 g/dL — ABNORMAL LOW (ref 32.0–36.0)
MCV: 80.4 fL (ref 80.0–100.0)
MPV: 12.6 fL — ABNORMAL HIGH (ref 7.5–12.5)
Monocytes Relative: 14 %
Neutro Abs: 3069 cells/uL (ref 1500–7800)
Neutrophils Relative %: 46.5 %
Platelets: 223 10*3/uL (ref 140–400)
RBC: 4.84 10*6/uL (ref 4.20–5.80)
RDW: 18.4 % — ABNORMAL HIGH (ref 11.0–15.0)
Total Lymphocyte: 35 %
WBC: 6.6 10*3/uL (ref 3.8–10.8)

## 2019-02-14 LAB — COMPREHENSIVE METABOLIC PANEL
AG Ratio: 1.4 (calc) (ref 1.0–2.5)
ALT: 16 U/L (ref 9–46)
AST: 19 U/L (ref 10–35)
Albumin: 4.3 g/dL (ref 3.6–5.1)
Alkaline phosphatase (APISO): 102 U/L (ref 35–144)
BUN: 15 mg/dL (ref 7–25)
CO2: 27 mmol/L (ref 20–32)
Calcium: 9.6 mg/dL (ref 8.6–10.3)
Chloride: 106 mmol/L (ref 98–110)
Creat: 0.8 mg/dL (ref 0.70–1.33)
Globulin: 3 g/dL (calc) (ref 1.9–3.7)
Glucose, Bld: 78 mg/dL (ref 65–99)
Potassium: 4.7 mmol/L (ref 3.5–5.3)
Sodium: 139 mmol/L (ref 135–146)
Total Bilirubin: 0.3 mg/dL (ref 0.2–1.2)
Total Protein: 7.3 g/dL (ref 6.1–8.1)

## 2019-02-14 LAB — LIPID PANEL
Cholesterol: 159 mg/dL (ref <200)
HDL: 38 mg/dL — ABNORMAL LOW (ref >=40)
LDL Cholesterol (Calc): 97 mg/dL (calc)
Non-HDL Cholesterol (Calc): 121 mg/dL (calc) (ref <130)
Total CHOL/HDL Ratio: 4.2 (calc) (ref <5.0)
Triglycerides: 140 mg/dL (ref <150)

## 2019-02-14 LAB — HEMOGLOBIN A1C
Hgb A1c MFr Bld: 6.3 % of total Hgb — ABNORMAL HIGH (ref <5.7)
Mean Plasma Glucose: 134 (calc)
eAG (mmol/L): 7.4 (calc)

## 2019-02-14 MED ORDER — BLOOD GLUCOSE TEST VI STRP: ORAL_STRIP | 11 refills | Status: DC

## 2019-02-14 MED ORDER — BLOOD GLUCOSE SYSTEM PAK KIT: PACK | 1 refills | Status: DC

## 2019-02-14 MED ORDER — LANCETS MISC: 11 refills | Status: DC

## 2019-02-14 NOTE — Telephone Encounter (Signed)
Prescription sent to pharmacy.

## 2019-02-14 NOTE — Telephone Encounter (Signed)
-----   Message from Alycia Rossetti, MD sent at 02/13/2019  1:50 PM EDT ----- Regarding: Pt needs meter/ lancet/strip , check BID for diabetes

## 2019-02-21 ENCOUNTER — Other Ambulatory Visit: Payer: Self-pay | Admitting: *Deleted

## 2019-02-21 DIAGNOSIS — D649 Anemia, unspecified: Secondary | ICD-10-CM

## 2019-02-28 ENCOUNTER — Ambulatory Visit (HOSPITAL_COMMUNITY)
Admission: RE | Admit: 2019-02-28 | Discharge: 2019-02-28 | Disposition: A | Discharge status: Home / Self Care | Payer: BC Managed Care – PPO | Source: Ambulatory Visit | Attending: Family Medicine | Admitting: Family Medicine

## 2019-02-28 ENCOUNTER — Other Ambulatory Visit: Payer: Self-pay

## 2019-02-28 DIAGNOSIS — J329 Chronic sinusitis, unspecified: Secondary | ICD-10-CM | POA: Diagnosis not present

## 2019-03-12 ENCOUNTER — Other Ambulatory Visit: Payer: Self-pay | Admitting: *Deleted

## 2019-03-12 DIAGNOSIS — J343 Hypertrophy of nasal turbinates: Secondary | ICD-10-CM

## 2019-03-12 DIAGNOSIS — J342 Deviated nasal septum: Secondary | ICD-10-CM

## 2019-03-12 DIAGNOSIS — J329 Chronic sinusitis, unspecified: Secondary | ICD-10-CM

## 2019-03-15 DIAGNOSIS — M25512 Pain in left shoulder: Secondary | ICD-10-CM | POA: Diagnosis not present

## 2019-03-15 DIAGNOSIS — M1711 Unilateral primary osteoarthritis, right knee: Secondary | ICD-10-CM | POA: Diagnosis not present

## 2019-03-25 DIAGNOSIS — M1711 Unilateral primary osteoarthritis, right knee: Secondary | ICD-10-CM | POA: Diagnosis not present

## 2019-04-08 DIAGNOSIS — M25561 Pain in right knee: Secondary | ICD-10-CM | POA: Diagnosis not present

## 2019-04-08 DIAGNOSIS — M1711 Unilateral primary osteoarthritis, right knee: Secondary | ICD-10-CM | POA: Diagnosis not present

## 2019-04-22 DIAGNOSIS — M1711 Unilateral primary osteoarthritis, right knee: Secondary | ICD-10-CM | POA: Diagnosis not present

## 2019-05-02 ENCOUNTER — Ambulatory Visit: Payer: BC Managed Care – PPO | Attending: Internal Medicine

## 2019-05-02 ENCOUNTER — Other Ambulatory Visit: Payer: Self-pay

## 2019-05-02 DIAGNOSIS — Z20828 Contact with and (suspected) exposure to other viral communicable diseases: Secondary | ICD-10-CM | POA: Diagnosis not present

## 2019-05-02 DIAGNOSIS — Z20822 Contact with and (suspected) exposure to covid-19: Secondary | ICD-10-CM

## 2019-05-03 ENCOUNTER — Other Ambulatory Visit: Payer: BC Managed Care – PPO

## 2019-05-03 LAB — NOVEL CORONAVIRUS, NAA: SARS-CoV-2, NAA: NOT DETECTED

## 2019-05-06 ENCOUNTER — Other Ambulatory Visit: Payer: Self-pay | Admitting: Family Medicine

## 2019-05-06 MED ORDER — SILDENAFIL CITRATE 100 MG PO TABS: ORAL_TABLET | ORAL | 5 refills | Status: DC

## 2019-05-06 NOTE — Telephone Encounter (Signed)
Patient is asking for refill on generic viagra  [facility] [address]

## 2019-05-06 NOTE — Telephone Encounter (Signed)
Ok to refill?  Last refill 03/13/2017.

## 2019-05-29 DIAGNOSIS — M5412 Radiculopathy, cervical region: Secondary | ICD-10-CM | POA: Diagnosis not present

## 2019-06-14 ENCOUNTER — Other Ambulatory Visit: Payer: Self-pay

## 2019-06-14 ENCOUNTER — Ambulatory Visit: Payer: BC Managed Care – PPO | Attending: Internal Medicine

## 2019-06-14 DIAGNOSIS — Z20822 Contact with and (suspected) exposure to covid-19: Secondary | ICD-10-CM | POA: Insufficient documentation

## 2019-06-15 LAB — NOVEL CORONAVIRUS, NAA: SARS-CoV-2, NAA: NOT DETECTED

## 2019-06-17 ENCOUNTER — Other Ambulatory Visit: Payer: Self-pay

## 2019-06-22 ENCOUNTER — Other Ambulatory Visit: Payer: Self-pay | Admitting: Neurosurgery

## 2019-06-22 DIAGNOSIS — M5412 Radiculopathy, cervical region: Secondary | ICD-10-CM

## 2019-07-13 ENCOUNTER — Ambulatory Visit
Admission: RE | Admit: 2019-07-13 | Discharge: 2019-07-13 | Disposition: A | Discharge status: Home / Self Care | Payer: BC Managed Care – PPO | Source: Ambulatory Visit | Attending: Neurosurgery | Admitting: Neurosurgery

## 2019-07-13 DIAGNOSIS — M5412 Radiculopathy, cervical region: Secondary | ICD-10-CM

## 2019-07-22 DIAGNOSIS — N5201 Erectile dysfunction due to arterial insufficiency: Secondary | ICD-10-CM | POA: Diagnosis not present

## 2019-08-06 ENCOUNTER — Other Ambulatory Visit: Payer: Self-pay | Admitting: Family Medicine

## 2019-12-15 DIAGNOSIS — J018 Other acute sinusitis: Secondary | ICD-10-CM | POA: Diagnosis not present

## 2019-12-15 DIAGNOSIS — Z720 Tobacco use: Secondary | ICD-10-CM | POA: Diagnosis not present

## 2020-02-02 ENCOUNTER — Other Ambulatory Visit: Payer: Self-pay | Admitting: Family Medicine

## 2020-02-18 DIAGNOSIS — M79642 Pain in left hand: Secondary | ICD-10-CM | POA: Diagnosis not present

## 2020-03-11 DIAGNOSIS — J0100 Acute maxillary sinusitis, unspecified: Secondary | ICD-10-CM | POA: Diagnosis not present

## 2020-03-23 ENCOUNTER — Inpatient Hospital Stay (HOSPITAL_COMMUNITY)
Admission: EM | Admit: 2020-03-23 | Discharge: 2020-03-26 | DRG: 812 | Disposition: A | Discharge status: Home / Self Care | Payer: BC Managed Care – PPO | Attending: Internal Medicine | Admitting: Internal Medicine

## 2020-03-23 ENCOUNTER — Emergency Department (HOSPITAL_COMMUNITY): Payer: BC Managed Care – PPO

## 2020-03-23 ENCOUNTER — Encounter (HOSPITAL_COMMUNITY): Payer: Self-pay

## 2020-03-23 ENCOUNTER — Other Ambulatory Visit: Payer: Self-pay

## 2020-03-23 DIAGNOSIS — R112 Nausea with vomiting, unspecified: Secondary | ICD-10-CM | POA: Diagnosis not present

## 2020-03-23 DIAGNOSIS — D509 Iron deficiency anemia, unspecified: Secondary | ICD-10-CM

## 2020-03-23 DIAGNOSIS — R739 Hyperglycemia, unspecified: Secondary | ICD-10-CM

## 2020-03-23 DIAGNOSIS — K449 Diaphragmatic hernia without obstruction or gangrene: Secondary | ICD-10-CM | POA: Diagnosis not present

## 2020-03-23 DIAGNOSIS — D500 Iron deficiency anemia secondary to blood loss (chronic): Principal | ICD-10-CM | POA: Diagnosis present

## 2020-03-23 DIAGNOSIS — R42 Dizziness and giddiness: Secondary | ICD-10-CM | POA: Diagnosis not present

## 2020-03-23 DIAGNOSIS — Z79899 Other long term (current) drug therapy: Secondary | ICD-10-CM

## 2020-03-23 DIAGNOSIS — Z23 Encounter for immunization: Secondary | ICD-10-CM | POA: Diagnosis not present

## 2020-03-23 DIAGNOSIS — R0602 Shortness of breath: Secondary | ICD-10-CM | POA: Diagnosis not present

## 2020-03-23 DIAGNOSIS — R531 Weakness: Secondary | ICD-10-CM

## 2020-03-23 DIAGNOSIS — E1169 Type 2 diabetes mellitus with other specified complication: Secondary | ICD-10-CM

## 2020-03-23 DIAGNOSIS — E119 Type 2 diabetes mellitus without complications: Secondary | ICD-10-CM

## 2020-03-23 DIAGNOSIS — D649 Anemia, unspecified: Secondary | ICD-10-CM | POA: Diagnosis present

## 2020-03-23 DIAGNOSIS — K2970 Gastritis, unspecified, without bleeding: Secondary | ICD-10-CM | POA: Diagnosis not present

## 2020-03-23 DIAGNOSIS — Z7984 Long term (current) use of oral hypoglycemic drugs: Secondary | ICD-10-CM

## 2020-03-23 DIAGNOSIS — R1110 Vomiting, unspecified: Secondary | ICD-10-CM | POA: Diagnosis not present

## 2020-03-23 DIAGNOSIS — Z20822 Contact with and (suspected) exposure to covid-19: Secondary | ICD-10-CM | POA: Diagnosis present

## 2020-03-23 DIAGNOSIS — K621 Rectal polyp: Secondary | ICD-10-CM | POA: Diagnosis present

## 2020-03-23 DIAGNOSIS — E669 Obesity, unspecified: Secondary | ICD-10-CM | POA: Diagnosis not present

## 2020-03-23 DIAGNOSIS — E871 Hypo-osmolality and hyponatremia: Secondary | ICD-10-CM | POA: Diagnosis not present

## 2020-03-23 DIAGNOSIS — G894 Chronic pain syndrome: Secondary | ICD-10-CM | POA: Diagnosis present

## 2020-03-23 DIAGNOSIS — K635 Polyp of colon: Secondary | ICD-10-CM | POA: Diagnosis present

## 2020-03-23 DIAGNOSIS — K5909 Other constipation: Secondary | ICD-10-CM | POA: Diagnosis present

## 2020-03-23 DIAGNOSIS — R1310 Dysphagia, unspecified: Secondary | ICD-10-CM | POA: Diagnosis present

## 2020-03-23 DIAGNOSIS — E1165 Type 2 diabetes mellitus with hyperglycemia: Secondary | ICD-10-CM | POA: Diagnosis present

## 2020-03-23 DIAGNOSIS — K219 Gastro-esophageal reflux disease without esophagitis: Secondary | ICD-10-CM | POA: Diagnosis not present

## 2020-03-23 DIAGNOSIS — Z6833 Body mass index (BMI) 33.0-33.9, adult: Secondary | ICD-10-CM

## 2020-03-23 DIAGNOSIS — F17210 Nicotine dependence, cigarettes, uncomplicated: Secondary | ICD-10-CM | POA: Diagnosis not present

## 2020-03-23 DIAGNOSIS — D124 Benign neoplasm of descending colon: Secondary | ICD-10-CM | POA: Diagnosis not present

## 2020-03-23 DIAGNOSIS — R109 Unspecified abdominal pain: Secondary | ICD-10-CM | POA: Diagnosis not present

## 2020-03-23 DIAGNOSIS — R1031 Right lower quadrant pain: Secondary | ICD-10-CM | POA: Diagnosis not present

## 2020-03-23 DIAGNOSIS — Z791 Long term (current) use of non-steroidal anti-inflammatories (NSAID): Secondary | ICD-10-CM

## 2020-03-23 DIAGNOSIS — R079 Chest pain, unspecified: Secondary | ICD-10-CM | POA: Diagnosis not present

## 2020-03-23 DIAGNOSIS — K3189 Other diseases of stomach and duodenum: Secondary | ICD-10-CM | POA: Diagnosis not present

## 2020-03-23 DIAGNOSIS — K921 Melena: Secondary | ICD-10-CM | POA: Diagnosis not present

## 2020-03-23 HISTORY — DX: Type 2 diabetes mellitus without complications: E11.9

## 2020-03-23 LAB — BASIC METABOLIC PANEL
Anion gap: 11 (ref 5–15)
BUN: 21 mg/dL — ABNORMAL HIGH (ref 6–20)
CO2: 21 mmol/L — ABNORMAL LOW (ref 22–32)
Calcium: 9 mg/dL (ref 8.9–10.3)
Chloride: 99 mmol/L (ref 98–111)
Creatinine, Ser: 0.84 mg/dL (ref 0.61–1.24)
GFR, Estimated: >60 mL/min (ref >60)
Glucose, Bld: 186 mg/dL — ABNORMAL HIGH (ref 70–99)
Potassium: 3.6 mmol/L (ref 3.5–5.1)
Sodium: 131 mmol/L — ABNORMAL LOW (ref 135–145)

## 2020-03-23 LAB — CBC WITH DIFFERENTIAL/PLATELET
Abs Immature Granulocytes: 0.02 10*3/uL (ref 0.00–0.07)
Basophils Absolute: 0.1 10*3/uL (ref 0.0–0.1)
Basophils Relative: 1 %
Eosinophils Absolute: 0.2 10*3/uL (ref 0.0–0.5)
Eosinophils Relative: 3 %
HCT: 23 % — ABNORMAL LOW (ref 39.0–52.0)
Hemoglobin: 6.6 g/dL — CL (ref 13.0–17.0)
Immature Granulocytes: 0 %
Lymphocytes Relative: 33 %
Lymphs Abs: 2.4 10*3/uL (ref 0.7–4.0)
MCH: 20.9 pg — ABNORMAL LOW (ref 26.0–34.0)
MCHC: 28.7 g/dL — ABNORMAL LOW (ref 30.0–36.0)
MCV: 72.8 fL — ABNORMAL LOW (ref 80.0–100.0)
Monocytes Absolute: 0.7 10*3/uL (ref 0.1–1.0)
Monocytes Relative: 10 %
Neutro Abs: 3.8 10*3/uL (ref 1.7–7.7)
Neutrophils Relative %: 53 %
Platelets: 287 10*3/uL (ref 150–400)
RBC: 3.16 MIL/uL — ABNORMAL LOW (ref 4.22–5.81)
RDW: 20 % — ABNORMAL HIGH (ref 11.5–15.5)
WBC: 7.2 10*3/uL (ref 4.0–10.5)
nRBC: 0.3 % — ABNORMAL HIGH (ref 0.0–0.2)

## 2020-03-23 LAB — RESP PANEL BY RT PCR (RSV, FLU A&B, COVID)
Influenza A by PCR: NEGATIVE
Influenza B by PCR: NEGATIVE
Respiratory Syncytial Virus by PCR: NEGATIVE
SARS Coronavirus 2 by RT PCR: NEGATIVE

## 2020-03-23 LAB — PREPARE RBC (CROSSMATCH)

## 2020-03-23 LAB — RETICULOCYTES
Immature Retic Fract: 16 % — ABNORMAL HIGH (ref 2.3–15.9)
RBC.: 3.14 MIL/uL — ABNORMAL LOW (ref 4.22–5.81)
Retic Count, Absolute: 50.6 10*3/uL (ref 19.0–186.0)
Retic Ct Pct: 1.6 % (ref 0.4–3.1)

## 2020-03-23 LAB — IRON AND TIBC
Iron: 5 ug/dL — ABNORMAL LOW (ref 45–182)
Saturation Ratios: 1 % — ABNORMAL LOW (ref 17.9–39.5)
TIBC: 482 ug/dL — ABNORMAL HIGH (ref 250–450)
UIBC: 477 ug/dL

## 2020-03-23 LAB — FOLATE: Folate: 20.1 ng/mL (ref >5.9)

## 2020-03-23 LAB — POC OCCULT BLOOD, ED: Fecal Occult Bld: NEGATIVE

## 2020-03-23 LAB — FERRITIN: Ferritin: 3 ng/mL — ABNORMAL LOW (ref 24–336)

## 2020-03-23 LAB — TROPONIN I (HIGH SENSITIVITY)
Troponin I (High Sensitivity): 17 ng/L (ref <18)
Troponin I (High Sensitivity): 16 ng/L (ref <18)

## 2020-03-23 LAB — VITAMIN B12: Vitamin B-12: 382 pg/mL (ref 180–914)

## 2020-03-23 MED ORDER — SODIUM CHLORIDE 0.9 % IV SOLN
10.0000 mL/h | Freq: Once | INTRAVENOUS | Status: AC
  Administered 2020-03-24: 10 mL/h via INTRAVENOUS

## 2020-03-23 NOTE — ED Provider Notes (Addendum)
[facility] EMERGENCY DEPARTMENT Provider Note   CSN: [id number] Arrival date & time: 03/23/20  1748     History Chief Complaint  Patient presents with  . cough  . Chest Pain    Stephen Roberts is a 54 y.o. male.  HPI He presents for evaluation of general weakness with dyspnea on exertion, mild chest discomfort, and feeling ill.  He had a sinus infection recently and was treated via telemedicine, with an oral antibiotic.  No prior similar problems.  No visualized blood loss in stool.  No recent trauma.  There are no other known modifying factors.    Past Medical History:  Diagnosis Date  . Arthritis   . GERD (gastroesophageal reflux disease)   . Heart murmur   . Pneumonia 06/27/2006    Patient Active Problem List   Diagnosis Date Noted  . Hyperlipidemia 02/13/2019  . Type 2 diabetes mellitus ([place]) 03/14/2018  . Cervical disc disorder with radiculopathy, high cervical region 05/30/2017  . Chest pain 04/28/2017  . Chronic pain syndrome 11/10/2014  . Smoker 07/30/2014  . Allergic rhinitis 07/30/2014  . Allergic dermatitis 07/30/2014  . Prostate cancer screening 07/30/2014  . Screening for colorectal cancer 07/30/2014  . Incarcerated epigastric hernia 01/11/2013  . Incarcerated umbilical hernia 40/98/1191  . Obesity, unspecified 01/11/2013  . Tobacco use disorder 01/11/2013    Past Surgical History:  Procedure Laterality Date  . ANTERIOR FUSION CERVICAL SPINE    . disc in neck Left 05/2017  . INSERTION OF MESH N/A 01/23/2013   Procedure: INSERTION OF MESH;  Surgeon: Adin Hector, MD;  Location: [facility];  Service: General;  Laterality: N/A;  . TONSILLECTOMY     as child  . VENTRAL HERNIA REPAIR N/A 01/23/2013   Procedure: LAPAROSCOPIC MULTIPLE VENTRAL HERNIAS;  Surgeon: Adin Hector, MD;  Location: [facility];  Service: General;  Laterality: N/A;       Family History  Problem Relation Age of Onset  . Depression Mother   . Hearing loss Mother   .  Hyperlipidemia Mother   . Hypertension Mother   . Stroke Mother   . Early death Father   . Diabetes Brother     Social History   Tobacco Use  . Smoking status: Current Every Day Smoker    Packs/day: 0.50    Years: 30.00    Pack years: 15.00    Types: Cigarettes  . Smokeless tobacco: Never Used  Substance Use Topics  . Alcohol use: Yes    Comment: moderately  . Drug use: No    Home Medications Prior to Admission medications   Medication Sig Start Date End Date Taking? Authorizing Provider  acetaminophen (TYLENOL) 650 MG CR tablet Take 1,300 mg by mouth every 8 (eight) hours as needed for pain.   Yes [provider]  albuterol (PROVENTIL HFA;VENTOLIN HFA) 108 (90 Base) MCG/ACT inhaler TAKE 2 PUFFS BY MOUTH EVERY 6 HOURS AS NEEDED FOR WHEEZE OR SHORTNESS OF BREATH 12/28/17  Yes Dena Billet B, PA-C  ibuprofen (ADVIL) 200 MG tablet Take 600 mg by mouth every 6 (six) hours as needed.   Yes [provider]  metFORMIN (GLUCOPHAGE) 500 MG tablet TAKE 1 TABLET BY MOUTH TWICE DAILY WITH A MEAL Patient taking differently: Take 500 mg by mouth daily with breakfast.  02/03/20  Yes Susy Frizzle, MD  Multiple Vitamin (MULTIVITAMIN) tablet Take 1 tablet by mouth daily.   Yes [provider]  mupirocin ointment (BACTROBAN) 2 % Place 1  application into the nose 2 (two) times daily.   Yes [provider]  Probiotic Product (PROBIOTIC DAILY PO) Take 1 tablet by mouth daily.   Yes [provider]  amoxicillin-clavulanate (AUGMENTIN) 875-125 MG tablet SMARTSIG:1 Tablet(s) By Mouth Every 12 Hours Patient not taking: Reported on 03/23/2020 12/15/19   [provider]  Blood Glucose Monitoring Suppl (BLOOD GLUCOSE SYSTEM PAK) KIT Please dispense based on patient and insurance preference. Use as directed to monitor FSBS 2x daily. Dx: E11.9 02/14/19   Alycia Rossetti, MD  doxycycline (VIBRA-TABS) 100 MG tablet Take 1 tablet (100 mg total) by mouth 2 (two)  times daily. Patient not taking: Reported on 03/23/2020 02/13/19   Alycia Rossetti, MD  Glucose Blood (BLOOD GLUCOSE TEST STRIPS) STRP Please dispense based on patient and insurance preference. Use as directed to monitor FSBS 2x daily. Dx: E11.9 02/14/19   Alycia Rossetti, MD  Lancets MISC Please dispense based on patient and insurance preference. Use as directed to monitor FSBS 2x daily. Dx: E11.9 02/14/19   Alycia Rossetti, MD  sildenafil (VIAGRA) 100 MG tablet One by mouth 30 min prior to activity as needed Patient not taking: Reported on 03/23/2020 05/06/19   Susy Frizzle, MD    Allergies    Gabapentin and Lyrica [pregabalin]  Review of Systems   Review of Systems  All other systems reviewed and are negative.   Physical Exam Updated Vital Signs BP (!) 108/53   Pulse 78   Temp 98.3 F (36.8 C) (Oral)   Resp (!) 23   Ht '5\' 9"'  (1.753 m)   Wt 104.3 kg   SpO2 100%   BMI 33.97 kg/m   Physical Exam Vitals and nursing note reviewed.  Constitutional:      General: He is not in acute distress.    Appearance: He is well-developed. He is not ill-appearing, toxic-appearing or diaphoretic.  HENT:     Head: Normocephalic and atraumatic.     Right Ear: External ear normal.     Left Ear: External ear normal.  Eyes:     Conjunctiva/sclera: Conjunctivae normal.     Pupils: Pupils are equal, round, and reactive to light.  Neck:     Trachea: Phonation normal.  Cardiovascular:     Rate and Rhythm: Normal rate and regular rhythm.     Heart sounds: Normal heart sounds.  Pulmonary:     Effort: Pulmonary effort is normal.     Breath sounds: Normal breath sounds.  Abdominal:     General: There is no distension.     Palpations: Abdomen is soft.     Tenderness: There is no abdominal tenderness.  Genitourinary:    Comments: Brown stool in rectal vault Musculoskeletal:        General: Normal range of motion.     Cervical back: Normal range of motion and neck supple.  Skin:     General: Skin is warm and dry.  Neurological:     Mental Status: He is alert and oriented to person, place, and time.     Cranial Nerves: No cranial nerve deficit.     Sensory: No sensory deficit.     Motor: No abnormal muscle tone.     Coordination: Coordination normal.  Psychiatric:        Behavior: Behavior normal.        Thought Content: Thought content normal.        Judgment: Judgment normal.     ED Results /  Procedures / Treatments   Labs (all labs ordered are listed, but only abnormal results are displayed) Labs Reviewed  CBC WITH DIFFERENTIAL/PLATELET - Abnormal; Notable for the following components:      Result Value   RBC 3.16 (*)    Hemoglobin 6.6 (*)    HCT 23.0 (*)    MCV 72.8 (*)    MCH 20.9 (*)    MCHC 28.7 (*)    RDW 20.0 (*)    nRBC 0.3 (*)    All other components within normal limits  BASIC METABOLIC PANEL - Abnormal; Notable for the following components:   Sodium 131 (*)    CO2 21 (*)    Glucose, Bld 186 (*)    BUN 21 (*)    All other components within normal limits  RETICULOCYTES - Abnormal; Notable for the following components:   RBC. 3.14 (*)    Immature Retic Fract 16.0 (*)    All other components within normal limits  RESP PANEL BY RT PCR (RSV, FLU A&B, COVID)  VITAMIN B12  FOLATE  IRON AND TIBC  FERRITIN  POC OCCULT BLOOD, ED  TYPE AND SCREEN  PREPARE RBC (CROSSMATCH)  TROPONIN I (HIGH SENSITIVITY)  TROPONIN I (HIGH SENSITIVITY)    EKG EKG Interpretation  Date/Time:  Monday March 23 2020 18:27:38 EST Ventricular Rate:  86 PR Interval:  154 QRS Duration: 92 QT Interval:  342 QTC Calculation: 409 R Axis:   -17 Text Interpretation: Normal sinus rhythm Normal ECG since last tracing no significant change Confirmed by Daleen Bo [phone]) on 03/23/2020 6:46:16 PM   Radiology DG Chest Portable 1 View  Result Date: 03/23/2020 CLINICAL DATA:  Chest pain EXAM: PORTABLE CHEST 1 VIEW COMPARISON:  04/28/2017 FINDINGS: 1841 hours. The  lungs are clear without focal pneumonia, edema, pneumothorax or pleural effusion. The cardiopericardial silhouette is within normal limits for size. The visualized bony structures of the thorax show no acute abnormality. IMPRESSION: No active disease. Electronically Signed   By: Misty Stanley M.D.   On: 03/23/2020 18:56    Procedures .Critical Care Performed by: Daleen Bo, MD Authorized by: Daleen Bo, MD   Critical care provider statement:    Critical care time (minutes):  40   Critical care start time:  03/23/2020 7:00 PM   Critical care end time:  03/23/2020 11:47 PM   Critical care time was exclusive of:  Separately billable procedures and treating other patients   Critical care was necessary to treat or prevent imminent or life-threatening deterioration of the following conditions:  Circulatory failure   Critical care was time spent personally by me on the following activities:  Blood draw for specimens, development of treatment plan with patient or surrogate, discussions with consultants, evaluation of patient's response to treatment, examination of patient, obtaining history from patient or surrogate, ordering and performing treatments and interventions, ordering and review of laboratory studies, pulse oximetry, re-evaluation of patient's condition, review of old charts and ordering and review of radiographic studies   (including critical care time)  Medications Ordered in ED Medications  0.9 %  sodium chloride infusion (has no administration in time range)    ED Course  I have reviewed the triage vital signs and the nursing notes.  Pertinent labs & imaging results that were available during my care of the patient were reviewed by me and considered in my medical decision making (see chart for details).  Clinical Course as of Mar 23 2312  [facility] Mar 23, 2020  2248  POC occult blood, ED [EW]    Clinical Course User Index [EW] Daleen Bo, MD   MDM Rules/Calculators/A&P                            Patient Vitals for the past 24 hrs:  BP Temp Temp src Pulse Resp SpO2 Height Weight  03/23/20 2230 (!) 108/53 -- -- 78 (!) 23 100 % -- --  03/23/20 2200 (!) 88/53 -- -- 79 (!) 22 96 % -- --  03/23/20 2130 131/72 -- -- 88 (!) 25 100 % -- --  03/23/20 2100 120/69 -- -- 80 16 100 % -- --  03/23/20 2045 (!) 111/56 -- -- 84 18 100 % -- --  03/23/20 2007 125/88 98.3 F (36.8 C) Oral 84 18 98 % -- --  03/23/20 1825 115/61 98.7 F (37.1 C) Oral 87 20 98 % -- --  03/23/20 1822 -- -- -- -- -- -- '5\' 9"'  (1.753 m) 104.3 kg    11:13 PM Reevaluation with update and discussion. After initial assessment and treatment, an updated evaluation reveals no change in clinical status, findings discussed with patient and all questions were answered. Daleen Bo   Medical Decision Making:  This patient is presenting for evaluation of general weakness, which does require a range of treatment options, and is a complaint that involves a moderate risk of morbidity and mortality. The differential diagnoses include acute illness including infection, metabolic disorder, bleeding, cardiac disorder. I decided to review old records, and in summary relatively healthy middle-aged male presenting for evaluation of general weakness, with nonspecific symptoms and recent sinus infection.  I did not require additional historical information from anyone.  Clinical Laboratory Tests Ordered, included CBC, Metabolic panel and Anemia panel. Review indicates reticulocyte count low, sodium low, CO2 low, glucose high, BUN high, hemoglobin low, MCV low, RBC indices low. Radiologic Tests Ordered, included chest x-ray.  I independently Visualized: Radiographic images, which show no infiltrate or edema     Specimen of stool indicating brown color.   Critical Interventions-clinical evaluation, laboratory testing, observation reassessment  After These Interventions, the Patient was reevaluated and was found  with significant anemia requiring transfusion for symptomatic reasons.  Etiology of anemia not clear.  No blood in the stool in rectal vault.  No historical finding for blood loss.  None specific symptoms, without concern for ACS, hemodynamic instability or impending vascular collapse.  Patient requires transfusion and further work-up prior to disposition from the ED.  Hospitalist contacted to admit patient.  CRITICAL CARE-yes Performed by: Daleen Bo  Nursing Notes Reviewed/ Care Coordinated Applicable Imaging Reviewed Interpretation of Laboratory Data incorporated into ED treatment   11:13 PM-Consult complete with hospitalist. Patient case explained and discussed.  She agrees to admit patient for further evaluation and treatment. Call ended at 11:25 PM  Plan: Admit    Final Clinical Impression(s) / ED Diagnoses Final diagnoses:  Anemia, unspecified type    Rx / DC Orders ED Discharge Orders    None       Daleen Bo, MD 03/23/20 7829    Daleen Bo, MD 04/04/20 1050

## 2020-03-23 NOTE — ED Triage Notes (Signed)
Pt reports 2 weeks ago was started on an antibiotic for sinus infection.  Says started to feel better but approx 1 week ago started having severe fatigue, sob, chest pain, nausea, and headache.

## 2020-03-23 NOTE — ED Notes (Signed)
Date and time results received: 03/23/20 2106 (use smartphrase ".now" to insert current time)  Test: hemoglobin Critical Value: 6.6  Name of Provider Notified: Dr. Eulis Foster notified  Orders Received? Or Actions Taken?: no/na

## 2020-03-23 NOTE — ED Notes (Signed)
Chaperoned physician with rectal exam.

## 2020-03-23 NOTE — H&P (Signed)
History and Physical  PACER DORN TFT:[id number] DOB: 1965-07-07 DOA: 03/23/2020  Referring physician:  PCP: Alycia Rossetti, MD  Patient coming from: Home  Chief Complaint: Generalized weakness and cough  HPI: Stephen Roberts is a 54 y.o. male with medical history significant for type 2 DM, chronic pain and obesity who presents to the emergency department due to 2-3-week onset of generalized weakness associated with shortness of breath on exertion and some mild chest discomfort which has progressively got worse.  Patient states that he had a recent sinus infection and that he was prescribed with an oral antibiotic via telemedicine.  He states that while he bent down to lift up his grandchild this afternoon, he felt lightheaded and had to sit down for some time, so he decided to go to the ED for further evaluation and management.  He endorsed 43-pack-year history of tobacco use, but he has since abstained from use of tobacco.  Patient also states that he has been taking a lot of ibuprofen and Tylenol within last 3 years due to chronic pain from prior surgeries to the neck area.  He denies blood in stool, vomiting, shortness of breath, fever or chills.  ED Course:  In the emergency department, he was hemodynamically stable, work-up in the ED showed H/H 6.6/23.0, MCV 72.8, hyponatremia and hyperglycemia.  FOBT was negative.  Chest x-ray showed no active disease.  Type and screen was done and 2 units of PRBC was ordered in the ED.  Hospitalist was asked to admit.  For further evaluation and management.  Review of Systems: Constitutional: Negative for chills and fever.  HENT: Negative for ear pain and sore throat.   Eyes: Negative for pain and visual disturbance.  Respiratory: Negative for cough, chest tightness and shortness of breath.   Cardiovascular: Positive for chest discomfort.  Negative for palpitations.  Gastrointestinal: Positive for  mild abdominal discomfort in the right lumbar  area.  Negative for vomiting.  Endocrine: Negative for polyphagia and polyuria.  Genitourinary: Negative for decreased urine volume, dysuria,  Musculoskeletal: Negative for arthralgias and back pain.  Skin: Negative for color change and rash.  Allergic/Immunologic: Negative for immunocompromised state.  Neurological: Positive for generalized weakness and lightheadedness.  Negative for tremors, syncope, speech difficulty Hematological: Does not bruise/bleed easily.  All other systems reviewed and are negative    Past Medical History:  Diagnosis Date  . Arthritis   . GERD (gastroesophageal reflux disease)   . Heart murmur   . Pneumonia 06/27/2006   Past Surgical History:  Procedure Laterality Date  . ANTERIOR FUSION CERVICAL SPINE    . disc in neck Left 05/2017  . INSERTION OF MESH N/A 01/23/2013   Procedure: INSERTION OF MESH;  Surgeon: Adin Hector, MD;  Location: [facility];  Service: General;  Laterality: N/A;  . TONSILLECTOMY     as child  . VENTRAL HERNIA REPAIR N/A 01/23/2013   Procedure: LAPAROSCOPIC MULTIPLE VENTRAL HERNIAS;  Surgeon: Adin Hector, MD;  Location: [facility];  Service: General;  Laterality: N/A;    Social History:  reports that he has been smoking cigarettes. He has a 15.00 pack-year smoking history. He has never used smokeless tobacco. He reports current alcohol use. He reports that he does not use drugs.   Allergies  Allergen Reactions  . Gabapentin Shortness Of Breath  . Lyrica [Pregabalin] Shortness Of Breath    Family History  Problem Relation Age of Onset  . Depression Mother   . Hearing loss  Mother   . Hyperlipidemia Mother   . Hypertension Mother   . Stroke Mother   . Early death Father   . Diabetes Brother     Prior to Admission medications   Medication Sig Start Date End Date Taking? Authorizing Provider  acetaminophen (TYLENOL) 650 MG CR tablet Take 1,300 mg by mouth every 8 (eight) hours as needed for pain.   Yes [provider]  albuterol (PROVENTIL HFA;VENTOLIN HFA) 108 (90 Base) MCG/ACT inhaler TAKE 2 PUFFS BY MOUTH EVERY 6 HOURS AS NEEDED FOR WHEEZE OR SHORTNESS OF BREATH 12/28/17  Yes Dena Billet B, PA-C  ibuprofen (ADVIL) 200 MG tablet Take 600 mg by mouth every 6 (six) hours as needed.   Yes [provider]  metFORMIN (GLUCOPHAGE) 500 MG tablet TAKE 1 TABLET BY MOUTH TWICE DAILY WITH A MEAL Patient taking differently: Take 500 mg by mouth daily with breakfast.  02/03/20  Yes Susy Frizzle, MD  Multiple Vitamin (MULTIVITAMIN) tablet Take 1 tablet by mouth daily.   Yes [provider]  mupirocin ointment (BACTROBAN) 2 % Place 1 application into the nose 2 (two) times daily.   Yes [provider]  Probiotic Product (PROBIOTIC DAILY PO) Take 1 tablet by mouth daily.   Yes [provider]  amoxicillin-clavulanate (AUGMENTIN) 875-125 MG tablet SMARTSIG:1 Tablet(s) By Mouth Every 12 Hours Patient not taking: Reported on 03/23/2020 12/15/19   [provider]  Blood Glucose Monitoring Suppl (BLOOD GLUCOSE SYSTEM PAK) KIT Please dispense based on patient and insurance preference. Use as directed to monitor FSBS 2x daily. Dx: E11.9 02/14/19   Alycia Rossetti, MD  doxycycline (VIBRA-TABS) 100 MG tablet Take 1 tablet (100 mg total) by mouth 2 (two) times daily. Patient not taking: Reported on 03/23/2020 02/13/19   Alycia Rossetti, MD  Glucose Blood (BLOOD GLUCOSE TEST STRIPS) STRP Please dispense based on patient and insurance preference. Use as directed to monitor FSBS 2x daily. Dx: E11.9 02/14/19   Alycia Rossetti, MD  Lancets MISC Please dispense based on patient and insurance preference. Use as directed to monitor FSBS 2x daily. Dx: E11.9 02/14/19   Alycia Rossetti, MD  sildenafil (VIAGRA) 100 MG tablet One by mouth 30 min prior to activity as needed Patient not taking: Reported on 03/23/2020 05/06/19   Susy Frizzle, MD    Physical Exam: BP 119/68   Pulse  72   Temp 97.9 F (36.6 C) (Oral)   Resp 17   Ht _0  (1.753 m)   Wt 104.3 kg   SpO2 99%   BMI 33.97 kg/m   . General: 54 y.o. year-old male well developed well nourished in no acute distress.  Alert and oriented x3. Marland Kitchen HEENT: Pale conjunctiva, NCAT, EOMI . Neck: Supple, trachea medial . Cardiovascular: Regular rate and rhythm with no rubs or gallops.  No thyromegaly or JVD noted.  No lower extremity edema. 2/4 pulses in all 4 extremities. Marland Kitchen Respiratory: Clear to auscultation with no wheezes or rales. Good inspiratory effort. . Abdomen: Soft nontender nondistended with normal bowel sounds x4 quadrants. . Muskuloskeletal: No cyanosis, clubbing or edema noted bilaterally . Neuro: CN II-XII intact, strength, sensation, reflexes . Skin: No ulcerative lesions noted or rashes . Psychiatry: Judgement and insight appear normal. Mood is appropriate for condition and setting          Labs on Admission:  Basic Metabolic Panel: Recent Labs  Lab 03/23/20 2016  NA 131*  K 3.6  CL  99  CO2 21*  GLUCOSE 186*  BUN 21*  CREATININE 0.84  CALCIUM 9.0   Liver Function Tests: No results for input(s): AST, ALT, ALKPHOS, BILITOT, PROT, ALBUMIN in the last 168 hours. No results for input(s): LIPASE, AMYLASE in the last 168 hours. No results for input(s): AMMONIA in the last 168 hours. CBC: Recent Labs  Lab 03/23/20 2016  WBC 7.2  NEUTROABS 3.8  HGB 6.6*  HCT 23.0*  MCV 72.8*  PLT 287   Cardiac Enzymes: No results for input(s): CKTOTAL, CKMB, CKMBINDEX, TROPONINI in the last 168 hours.  BNP (last 3 results) No results for input(s): BNP in the last 8760 hours.  ProBNP (last 3 results) No results for input(s): PROBNP in the last 8760 hours.  CBG: No results for input(s): GLUCAP in the last 168 hours.  Radiological Exams on Admission: DG Chest Portable 1 View  Result Date: 03/23/2020 CLINICAL DATA:  Chest pain EXAM: PORTABLE CHEST 1 VIEW COMPARISON:  04/28/2017 FINDINGS: 1841  hours. The lungs are clear without focal pneumonia, edema, pneumothorax or pleural effusion. The cardiopericardial silhouette is within normal limits for size. The visualized bony structures of the thorax show no acute abnormality. IMPRESSION: No active disease. Electronically Signed   By: Misty Stanley M.D.   On: 03/23/2020 18:56    EKG: I independently viewed the EKG done and my findings are as followed: Normal sinus rhythm at rate of 86 bpm  Assessment/Plan Present on Admission: . Symptomatic anemia . Obesity, unspecified  Principal Problem:   Symptomatic anemia Active Problems:   Obesity, unspecified   Hyperglycemia   Type 2 diabetes mellitus ([facility])   Generalized weakness   Lightheadedness   Microcytic anemia   Hyponatremia   Generalized weakness and lightheadedness due to symptomatic anemia H/H= 6.6/23.0, this was 12.4/38.9 on 02/13/2019 Type and crossmatch was done and 2 units of PRBC was ordered to be transfused in the ED He complained of abdominal discomfort in right lumbar area which started about 2 days PTA (11/6) and patient endorsed 3-year history of taking ibuprofen and Tylenol due to chronic pain Stool occult blood was negative Continue Protonix 40 mg p.o. daily Gastroenterologist  will be consulted in the morning and we shall await their recommendation  Microcytic anemia MCV 72.8, iron studies was done and showed decreased iron, decreased ferritin and elevated ferritin with indication for iron deficiency anemia. Continue ferrous sulfate 325 mg p.o. daily Continue docusate 100 mg p.o. daily to prevent constipation due to iron tablets  Hyperglycemia secondary to T2 diabetes mellitus Continue insulin sliding scale and hypoglycemia protocol Metformin will be held at this time  Hyponatremia Na 131, corrected sodium level based on hyperglycemia (CBG 186) =132 Continue to monitor sodium levels with morning lab  Obesity (BMI 33.97) Patient was counseled on diet and  lifestyle modification  DVT prophylaxis: SCDs (no indication for chemoprophylaxis at this time due to symptomatic anemia with suspicion for possible occult GI bleed)  Code Status: Full code  Family Communication: None at bedside  Disposition Plan:  Patient is from:                        home Anticipated DC to:                   home Anticipated DC date:                1 day Anticipated DC barriers:  Patient is unstable to be discharged at this time due to symptomatic anemia which requires blood transfusion   Consults called: Gastroenterology  Admission status: Observation    Bernadette Hoit MD [facility]  03/24/2020, 1:49 AM

## 2020-03-23 NOTE — ED Notes (Signed)
Patient resting in bed with 0 s/s acute distress.  Call bell in reach.

## 2020-03-24 ENCOUNTER — Observation Stay (HOSPITAL_COMMUNITY): Payer: BC Managed Care – PPO

## 2020-03-24 ENCOUNTER — Encounter (HOSPITAL_COMMUNITY): Payer: Self-pay | Admitting: Internal Medicine

## 2020-03-24 ENCOUNTER — Ambulatory Visit: Payer: BC Managed Care – PPO | Admitting: Family Medicine

## 2020-03-24 DIAGNOSIS — R531 Weakness: Secondary | ICD-10-CM

## 2020-03-24 DIAGNOSIS — D509 Iron deficiency anemia, unspecified: Secondary | ICD-10-CM

## 2020-03-24 DIAGNOSIS — R1310 Dysphagia, unspecified: Secondary | ICD-10-CM | POA: Insufficient documentation

## 2020-03-24 DIAGNOSIS — D649 Anemia, unspecified: Secondary | ICD-10-CM | POA: Diagnosis not present

## 2020-03-24 DIAGNOSIS — R1031 Right lower quadrant pain: Secondary | ICD-10-CM | POA: Diagnosis not present

## 2020-03-24 DIAGNOSIS — E119 Type 2 diabetes mellitus without complications: Secondary | ICD-10-CM

## 2020-03-24 DIAGNOSIS — E871 Hypo-osmolality and hyponatremia: Secondary | ICD-10-CM

## 2020-03-24 DIAGNOSIS — R739 Hyperglycemia, unspecified: Secondary | ICD-10-CM | POA: Diagnosis not present

## 2020-03-24 DIAGNOSIS — R42 Dizziness and giddiness: Secondary | ICD-10-CM

## 2020-03-24 LAB — HEMOGLOBIN A1C
Hgb A1c MFr Bld: 5.6 % (ref 4.8–5.6)
Mean Plasma Glucose: 114.02 mg/dL

## 2020-03-24 LAB — CBC
HCT: 28.9 % — ABNORMAL LOW (ref 39.0–52.0)
Hemoglobin: 8.4 g/dL — ABNORMAL LOW (ref 13.0–17.0)
MCH: 22 pg — ABNORMAL LOW (ref 26.0–34.0)
MCHC: 29.1 g/dL — ABNORMAL LOW (ref 30.0–36.0)
MCV: 75.7 fL — ABNORMAL LOW (ref 80.0–100.0)
Platelets: 254 K/uL (ref 150–400)
RBC: 3.82 MIL/uL — ABNORMAL LOW (ref 4.22–5.81)
RDW: 21.3 % — ABNORMAL HIGH (ref 11.5–15.5)
WBC: 5.2 K/uL (ref 4.0–10.5)
nRBC: 0 % (ref 0.0–0.2)

## 2020-03-24 LAB — GLUCOSE, CAPILLARY
Glucose-Capillary: 95 mg/dL (ref 70–99)
Glucose-Capillary: 116 mg/dL — ABNORMAL HIGH (ref 70–99)

## 2020-03-24 LAB — COMPREHENSIVE METABOLIC PANEL WITH GFR
ALT: 16 U/L (ref 0–44)
AST: 21 U/L (ref 15–41)
Albumin: 3.7 g/dL (ref 3.5–5.0)
Alkaline Phosphatase: 90 U/L (ref 38–126)
Anion gap: 10 (ref 5–15)
BUN: 15 mg/dL (ref 6–20)
CO2: 26 mmol/L (ref 22–32)
Calcium: 9.1 mg/dL (ref 8.9–10.3)
Chloride: 101 mmol/L (ref 98–111)
Creatinine, Ser: 0.7 mg/dL (ref 0.61–1.24)
GFR, Estimated: >60 mL/min
Glucose, Bld: 111 mg/dL — ABNORMAL HIGH (ref 70–99)
Potassium: 4.3 mmol/L (ref 3.5–5.1)
Sodium: 137 mmol/L (ref 135–145)
Total Bilirubin: 0.5 mg/dL (ref 0.3–1.2)
Total Protein: 7.6 g/dL (ref 6.5–8.1)

## 2020-03-24 LAB — CBG MONITORING, ED
Glucose-Capillary: 102 mg/dL — ABNORMAL HIGH (ref 70–99)
Glucose-Capillary: 88 mg/dL (ref 70–99)

## 2020-03-24 LAB — HIV ANTIBODY (ROUTINE TESTING W REFLEX): HIV Screen 4th Generation wRfx: NONREACTIVE

## 2020-03-24 LAB — MAGNESIUM: Magnesium: 2 mg/dL (ref 1.7–2.4)

## 2020-03-24 LAB — PROTIME-INR
INR: 1 (ref 0.8–1.2)
Prothrombin Time: 12.8 seconds (ref 11.4–15.2)

## 2020-03-24 LAB — APTT: aPTT: 27 seconds (ref 24–36)

## 2020-03-24 LAB — PHOSPHORUS: Phosphorus: 4.1 mg/dL (ref 2.5–4.6)

## 2020-03-24 MED ORDER — IOHEXOL 300 MG/ML  SOLN
100.0000 mL | Freq: Once | INTRAMUSCULAR | Status: AC | PRN
  Administered 2020-03-24: 100 mL via INTRAVENOUS

## 2020-03-24 MED ORDER — INFLUENZA VAC SPLIT QUAD 0.5 ML IM SUSY
0.5000 mL | PREFILLED_SYRINGE | INTRAMUSCULAR | Status: DC
  Filled 2020-03-24: qty 0.5

## 2020-03-24 MED ORDER — INSULIN ASPART 100 UNIT/ML ~~LOC~~ SOLN: 0.0000 [IU] | Freq: Three times a day (TID) | SUBCUTANEOUS | Status: DC

## 2020-03-24 MED ORDER — ONDANSETRON HCL 4 MG/2ML IJ SOLN: 4.0000 mg | Freq: Four times a day (QID) | INTRAMUSCULAR | Status: DC | PRN

## 2020-03-24 MED ORDER — PNEUMOCOCCAL VAC POLYVALENT 25 MCG/0.5ML IJ INJ
0.5000 mL | INJECTION | INTRAMUSCULAR | Status: DC
  Filled 2020-03-24: qty 0.5

## 2020-03-24 MED ORDER — PANTOPRAZOLE SODIUM 40 MG PO TBEC
40.0000 mg | DELAYED_RELEASE_TABLET | Freq: Every day | ORAL | Status: DC
  Administered 2020-03-24: 40 mg via ORAL
  Filled 2020-03-24: qty 1

## 2020-03-24 MED ORDER — FERROUS SULFATE 325 (65 FE) MG PO TABS
325.0000 mg | ORAL_TABLET | Freq: Every day | ORAL | Status: DC
  Administered 2020-03-24: 325 mg via ORAL
  Filled 2020-03-24: qty 1

## 2020-03-24 MED ORDER — ACETAMINOPHEN 325 MG PO TABS
650.0000 mg | ORAL_TABLET | Freq: Once | ORAL | Status: AC
  Administered 2020-03-24: 650 mg via ORAL
  Filled 2020-03-24: qty 2

## 2020-03-24 MED ORDER — DOCUSATE SODIUM 100 MG PO CAPS
100.0000 mg | ORAL_CAPSULE | Freq: Every day | ORAL | Status: DC
  Administered 2020-03-24 – 2020-03-25 (×2): 100 mg via ORAL
  Filled 2020-03-24 (×3): qty 1

## 2020-03-24 MED ORDER — PANTOPRAZOLE SODIUM 40 MG IV SOLR
40.0000 mg | Freq: Two times a day (BID) | INTRAVENOUS | Status: DC
  Administered 2020-03-24 – 2020-03-26 (×4): 40 mg via INTRAVENOUS
  Filled 2020-03-24 (×4): qty 40

## 2020-03-24 MED ORDER — INSULIN ASPART 100 UNIT/ML ~~LOC~~ SOLN: 0.0000 [IU] | Freq: Every day | SUBCUTANEOUS | Status: DC

## 2020-03-24 MED ORDER — ACETAMINOPHEN 325 MG PO TABS
650.0000 mg | ORAL_TABLET | ORAL | Status: DC | PRN
  Administered 2020-03-24 – 2020-03-25 (×2): 650 mg via ORAL
  Filled 2020-03-24: qty 2

## 2020-03-24 NOTE — ED Notes (Signed)
2nd unit PRBC transfusion complete.  0 s/s reaction noted.

## 2020-03-24 NOTE — Progress Notes (Signed)
PROGRESS NOTE    Stephen Roberts  TML:[id number] DOB: 10/10/1965 DOA: 03/23/2020 PCP: Alycia Rossetti, MD    Chief Complaint  Patient presents with  . cough  . Chest Pain    Brief Narrative:  54 year old gentleman with prior history of type 2 diabetes, obesity, chronic pain syndrome presents to ED with 2 to 3 weeks of generalized weakness, dyspnea on exertion,  chest discomfort on exertion and syncope.  On arrival to ED he was found to have a hemoglobin of 6.6, hematocrit of 23.  FOBT was negative.  Chest x-ray did not show any active disease.  He underwent 2 units of PRBC transfusion and repeat hemoglobin is around 8.  He was admitted to hospitalist service for further evaluation and management.  GI consulted and he is scheduled for an  EGD tomorrow.  In view of his family history of cardiac disease and dyspnea on exertion and echocardiogram ordered for further evaluation.   Assessment & Plan:   Principal Problem:   Symptomatic anemia Active Problems:   Obesity, unspecified   Hyperglycemia   Type 2 diabetes mellitus (HCC)   Generalized weakness   Lightheadedness   Microcytic anemia   Hyponatremia  Generalized weakness and lightheadedness probably secondary to anemia. S/p 2 units of PRBC transfusion and repeat H&H has improved to 8. FOBT is negative.  But patient is on chronic NSAID for back pain GI consulted and he is scheduled for EGD tomorrow Continue with IV PPI twice daily, transfuse PRBC to keep hemoglobin hemoglobin greater than 7.    Microcytic anemia/iron deficiency anemia Iron deficiency, low ferritin levels, iron supplementation added.    Type 2 diabetes mellitus CBG (last 3)  Recent Labs    03/24/20 0844 03/24/20 1203  GLUCAP 102* 88   Hemoglobin A1c is 5.6  Dyspnea on exertion, syncope Echocardiogram ordered for further evaluation, monitor on telemetry overnight.  EKG was normal sinus rhythm Troponins are negative    DVT prophylaxis:  SCDs Code Status: Full code Family Communication: Family at bedside Disposition:   Status is: Observation  The patient will require care spanning > 2 midnights and should be moved to inpatient because: Ongoing diagnostic testing needed not appropriate for outpatient work up, Unsafe d/c plan, IV treatments appropriate due to intensity of illness or inability to take PO and Inpatient level of care appropriate due to severity of illness  Dispo: The patient is from: Home              Anticipated d/c is to: Home              Anticipated d/c date is: 2 days              Patient currently is not medically stable to d/c.       Consultants:   Gastroenterology.    Procedures: EGD,   Antimicrobials: none.   Subjective: Pt reports having doe this morning.  No chest pain. No GERD   Objective: Vitals:   03/24/20 1300 03/24/20 1330 03/24/20 1400 03/24/20 1430  BP: 99/63 118/85 (!) 127/109 117/73  Pulse: 70 86 78 77  Resp: (!) 22 (!) 22 10 (!) 21  Temp:      TempSrc:      SpO2: 100% 97% 99% 99%  Weight:      Height:        Intake/Output Summary (Last 24 hours) at 03/24/2020 1501 Last data filed at 03/24/2020 6812 Gross per 24 hour  Intake 970 ml  Output --  Net 970 ml   Filed Weights   03/23/20 1822  Weight: 104.3 kg    Examination:  General exam: Appears calm and comfortable  Respiratory system: Clear to auscultation. Respiratory effort normal. Cardiovascular system: S1 & S2 heard, RRR. No JVD, murmurs. No pedal edema. Gastrointestinal system: Abdomen is nondistended, soft and nontender. Normal bowel sounds heard. Central nervous system: Alert and oriented. No focal neurological deficits. Extremities: Symmetric 5 x 5 power. Skin: No rashes, lesions or ulcers Psychiatry:  Mood & affect appropriate.     Data Reviewed: I have personally reviewed following labs and imaging studies  CBC: Recent Labs  Lab 03/23/20 2016 03/24/20 0609  WBC 7.2 5.2  NEUTROABS 3.8   --   HGB 6.6* 8.4*  HCT 23.0* 28.9*  MCV 72.8* 75.7*  PLT 287 017    Basic Metabolic Panel: Recent Labs  Lab 03/23/20 2016 03/24/20 0609  NA 131* 137  K 3.6 4.3  CL 99 101  CO2 21* 26  GLUCOSE 186* 111*  BUN 21* 15  CREATININE 0.84 0.70  CALCIUM 9.0 9.1  MG  --  2.0  PHOS  --  4.1    GFR: Estimated Creatinine Clearance: 122.6 mL/min (by C-G formula based on SCr of 0.7 mg/dL).  Liver Function Tests: Recent Labs  Lab 03/24/20 0609  AST 21  ALT 16  ALKPHOS 90  BILITOT 0.5  PROT 7.6  ALBUMIN 3.7    CBG: Recent Labs  Lab 03/24/20 0844 03/24/20 1203  GLUCAP 102* 88     Recent Results (from the past 240 hour(s))  Resp Panel by RT PCR (RSV, Flu A&B, Covid) - Nasopharyngeal Swab     Status: None   Collection Time: 03/23/20  6:28 PM   Specimen: Nasopharyngeal Swab  Result Value Ref Range Status   SARS Coronavirus 2 by RT PCR NEGATIVE NEGATIVE Final    Comment: (NOTE) SARS-CoV-2 target nucleic acids are NOT DETECTED.  The SARS-CoV-2 RNA is generally detectable in upper respiratoy specimens during the acute phase of infection. The lowest concentration of SARS-CoV-2 viral copies this assay can detect is 131 copies/mL. A negative result does not preclude SARS-Cov-2 infection and should not be used as the sole basis for treatment or other patient management decisions. A negative result may occur with  improper specimen collection/handling, submission of specimen other than nasopharyngeal swab, presence of viral mutation(s) within the areas targeted by this assay, and inadequate number of viral copies (<131 copies/mL). A negative result must be combined with clinical observations, patient history, and epidemiological information. The expected result is Negative.  Fact Sheet for Patients:  PinkCheek.be  Fact Sheet for Healthcare Providers:  GravelBags.it  This test is no t yet approved or cleared by  the [place] FDA and  has been authorized for detection and/or diagnosis of SARS-CoV-2 by FDA under an Emergency Use Authorization (EUA). This EUA will remain  in effect (meaning this test can be used) for the duration of the COVID-19 declaration under Section 564(b)(1) of the Act, 21 U.S.C. section 360bbb-3(b)(1), unless the authorization is terminated or revoked sooner.     Influenza A by PCR NEGATIVE NEGATIVE Final   Influenza B by PCR NEGATIVE NEGATIVE Final    Comment: (NOTE) The Xpert Xpress SARS-CoV-2/FLU/RSV assay is intended as an aid in  the diagnosis of influenza from Nasopharyngeal swab specimens and  should not be used as a sole basis for treatment. Nasal washings and  aspirates are unacceptable for Xpert Xpress  SARS-CoV-2/FLU/RSV  testing.  Fact Sheet for Patients: PinkCheek.be  Fact Sheet for Healthcare Providers: GravelBags.it  This test is not yet approved or cleared by the [place] FDA and  has been authorized for detection and/or diagnosis of SARS-CoV-2 by  FDA under an Emergency Use Authorization (EUA). This EUA will remain  in effect (meaning this test can be used) for the duration of the  Covid-19 declaration under Section 564(b)(1) of the Act, 21  U.S.C. section 360bbb-3(b)(1), unless the authorization is  terminated or revoked.    Respiratory Syncytial Virus by PCR NEGATIVE NEGATIVE Final    Comment: (NOTE) Fact Sheet for Patients: PinkCheek.be  Fact Sheet for Healthcare Providers: GravelBags.it  This test is not yet approved or cleared by the [place] FDA and  has been authorized for detection and/or diagnosis of SARS-CoV-2 by  FDA under an Emergency Use Authorization (EUA). This EUA will remain  in effect (meaning this test can be used) for the duration of the  COVID-19 declaration under Section 564(b)(1) of the Act, 21  U.S.C.  section 360bbb-3(b)(1), unless the authorization is terminated or  revoked. Performed at [facility], [address], [place], [place] [address]          Radiology Studies: CT ABDOMEN PELVIS W CONTRAST  Result Date: 03/24/2020 CLINICAL DATA:  Abdominal pain.  Nausea and vomiting with melena. EXAM: CT ABDOMEN AND PELVIS WITH CONTRAST TECHNIQUE: Multidetector CT imaging of the abdomen and pelvis was performed using the standard protocol following bolus administration of intravenous contrast. CONTRAST:  162mL OMNIPAQUE IOHEXOL 300 MG/ML  SOLN COMPARISON:  07/03/2010 FINDINGS: Lower chest: Clear lung bases. Hepatobiliary: No focal liver abnormality is seen. The gallbladder is collapsed. There is no biliary dilatation. Pancreas: Unremarkable. Spleen: Unremarkable. Adrenals/Urinary Tract: Slight right adrenal gland thickening. Unremarkable left adrenal gland. New punctate stone in the lower pole of the right kidney. No hydronephrosis or renal mass. Unremarkable bladder. Stomach/Bowel: There is a large sliding hiatal hernia which is larger than on the prior study. There is no evidence of bowel obstruction or inflammation. The appendix is unremarkable. Vascular/Lymphatic: Mild abdominal aortic atherosclerosis without aneurysm. No enlarged lymph nodes. Reproductive: Unremarkable prostate. Other: No ascites or pneumoperitoneum. Interval ventral hernia repair. Musculoskeletal: Old right-sided rib fractures. Mild lumbar disc and facet degeneration. No suspicious osseous lesion. IMPRESSION: 1. No acute abnormality identified in the abdomen or pelvis. 2. Large hiatal hernia. 3. Punctate nonobstructing right renal stone. 4. Aortic Atherosclerosis (ICD10-I70.0). Electronically Signed   By: Logan Bores M.D.   On: 03/24/2020 11:59   DG Chest Portable 1 View  Result Date: 03/23/2020 CLINICAL DATA:  Chest pain EXAM: PORTABLE CHEST 1 VIEW COMPARISON:  04/28/2017 FINDINGS: 1841 hours. The lungs are clear  without focal pneumonia, edema, pneumothorax or pleural effusion. The cardiopericardial silhouette is within normal limits for size. The visualized bony structures of the thorax show no acute abnormality. IMPRESSION: No active disease. Electronically Signed   By: Misty Stanley M.D.   On: 03/23/2020 18:56        Scheduled Meds: . docusate sodium  100 mg Oral Daily  . [START ON 03/25/2020] influenza vac split quadrivalent PF  0.5 mL Intramuscular Tomorrow-1000  . insulin aspart  0-15 Units Subcutaneous TID WC  . insulin aspart  0-5 Units Subcutaneous QHS  . pantoprazole (PROTONIX) IV  40 mg Intravenous Q12H  . [START ON 03/25/2020] pneumococcal 23 valent vaccine  0.5 mL Intramuscular Tomorrow-1000   Continuous Infusions:   LOS: 0 days  Hosie Poisson, MD [facility]   To contact the attending provider between 7A-7P or the covering provider during after hours 7P-7A, please log into the web site [email or web address] and access using universal [place] password for that web site. If you do not have the password, please call the hospital operator.  03/24/2020, 3:01 PM

## 2020-03-24 NOTE — ED Notes (Signed)
Report given to oncoming nurse.

## 2020-03-24 NOTE — ED Notes (Signed)
Initiated transfusion of first unit PRBC.

## 2020-03-24 NOTE — ED Notes (Signed)
First unit PRBC transfusion complete.  0 s/s reaction noted.

## 2020-03-24 NOTE — ED Notes (Signed)
Patient transported to CT 

## 2020-03-24 NOTE — Consult Note (Signed)
_0 @   Referring Provider: [facility] Primary Care Physician:  Alycia Rossetti, MD Primary Gastroenterologist:  Dr. Jenetta Downer (previously unassigned)  Date of Admission:  Date of Consultation:   Reason for Consultation:  Symptomatic anemia  HPI:  Stephen Roberts is a 54 y.o. year old male medical history significant for type 2 DM, chronic pain and obesity who presents to the emergency department due to 2-3-week onset of generalized weakness associated with shortness of breath on exertion and some mild chest discomfort which has progressively got worse.    ED Course:  In the emergency department, he was hemodynamically stable, work-up in the ED showed H/H 6.6/23.0 with microcytic indices, hyponatremia and hyperglycemia, BUN elevated at 21.  Rectal exam with brown stool in rectal vault. FOBT was negative.  Ferritin 3, saturation 1%, iron 5.  B12 and folate within normal limits.  Chest x-ray showed no active disease.  Troponin x2 negative.  Respiratory panel negative.  Type and screen was done and 2 units of PRBC was ordered in the ED. he was also started on Protonix 40 mg daily and oral iron.   CBC this morning with hemoglobin up to 8.4.  Hyponatremia also resolved.  Today:  Just finished eating a chicken biscuit.   For the last 1-2 weeks he has had worsening weakness, shortness of breath with minimal exertion, and chest discomfort for the last couple of days. Had a sinus infection a couple weeks ago. Took abx and thought he was getting a little better, but weakness worsened so he came to the ED.   Has a lot of acid reflux. Takes Nexium OTC daily. Occasional breakthrough symptoms depending on what he eats. Has severe symptoms every now and then associated with nausea and vomiting. This occurrs about 3 times a month. About 1 month ago, he had severe GERD symptoms with nausea and vomiting and had vomiting that was black along with melena. This lasted about 3 days. Also with solid  food dysphagia for the last 3 years since having an anterior approach neck surgery. Is very careful to chew thoroughly. Has had to regurgitate foods in the past. None recently. No abdominal pain typically. No bright red blood in his stools.   Usually with BMs daily. The last couple of days stools have been a little more hard.  Has a dull ache in the right mid of his abdomen. When he stretches, it feels like it is pulling. This started last night. Has chronic back and neck pain. Disabled due to this. For the last 2 months, he takes 2-3 Motirin daily. Last 20 years, up to 10 a day.   No anticoagulant medications or antiplatelets.   States he has an addictive personality. Doesn't want to get hooked on pain medications.   Dizziness is better today.  Shortness of breath is also improving.  TCS about 20 years ago. Had polyps. Cologuard 2 years which was negative. Had EGD in the last many years ago. Can't remember why. No rectal bleeding at that point.   About 12 pack of beer a week. History of drug use including Cocaine. None in a few months.   Past Medical History:  Diagnosis Date  . Arthritis   . GERD (gastroesophageal reflux disease)   . Heart murmur   . Pneumonia 06/27/2006    Past Surgical History:  Procedure Laterality Date  . ANTERIOR FUSION CERVICAL SPINE    . disc in neck Left 05/2017  . INSERTION OF MESH N/A 01/23/2013  Procedure: INSERTION OF MESH;  Surgeon: Adin Hector, MD;  Location: [facility];  Service: General;  Laterality: N/A;  . TONSILLECTOMY     as child  . VENTRAL HERNIA REPAIR N/A 01/23/2013   Procedure: LAPAROSCOPIC MULTIPLE VENTRAL HERNIAS;  Surgeon: Adin Hector, MD;  Location: [facility];  Service: General;  Laterality: N/A;    Prior to Admission medications   Medication Sig Start Date End Date Taking? Authorizing Provider  acetaminophen (TYLENOL) 650 MG CR tablet Take 1,300 mg by mouth every 8 (eight) hours as needed for pain.   Yes [provider]   albuterol (PROVENTIL HFA;VENTOLIN HFA) 108 (90 Base) MCG/ACT inhaler TAKE 2 PUFFS BY MOUTH EVERY 6 HOURS AS NEEDED FOR WHEEZE OR SHORTNESS OF BREATH 12/28/17  Yes Dena Billet B, PA-C  ibuprofen (ADVIL) 200 MG tablet Take 600 mg by mouth every 6 (six) hours as needed.   Yes [provider]  metFORMIN (GLUCOPHAGE) 500 MG tablet TAKE 1 TABLET BY MOUTH TWICE DAILY WITH A MEAL Patient taking differently: Take 500 mg by mouth daily with breakfast.  02/03/20  Yes Susy Frizzle, MD  Multiple Vitamin (MULTIVITAMIN) tablet Take 1 tablet by mouth daily.   Yes [provider]  mupirocin ointment (BACTROBAN) 2 % Place 1 application into the nose 2 (two) times daily.   Yes [provider]  Probiotic Product (PROBIOTIC DAILY PO) Take 1 tablet by mouth daily.   Yes [provider]  amoxicillin-clavulanate (AUGMENTIN) 875-125 MG tablet SMARTSIG:1 Tablet(s) By Mouth Every 12 Hours Patient not taking: Reported on 03/23/2020 12/15/19   [provider]  Blood Glucose Monitoring Suppl (BLOOD GLUCOSE SYSTEM PAK) KIT Please dispense based on patient and insurance preference. Use as directed to monitor FSBS 2x daily. Dx: E11.9 02/14/19   Alycia Rossetti, MD  doxycycline (VIBRA-TABS) 100 MG tablet Take 1 tablet (100 mg total) by mouth 2 (two) times daily. Patient not taking: Reported on 03/23/2020 02/13/19   Alycia Rossetti, MD  Glucose Blood (BLOOD GLUCOSE TEST STRIPS) STRP Please dispense based on patient and insurance preference. Use as directed to monitor FSBS 2x daily. Dx: E11.9 02/14/19   Alycia Rossetti, MD  Lancets MISC Please dispense based on patient and insurance preference. Use as directed to monitor FSBS 2x daily. Dx: E11.9 02/14/19   Alycia Rossetti, MD  sildenafil (VIAGRA) 100 MG tablet One by mouth 30 min prior to activity as needed Patient not taking: Reported on 03/23/2020 05/06/19   Susy Frizzle, MD    Current Facility-Administered Medications   Medication Dose Route Frequency Provider Last Rate Last Admin  . docusate sodium (COLACE) capsule 100 mg  100 mg Oral Daily Adefeso, Oladapo, DO   100 mg at 03/24/20 0915  . ferrous sulfate tablet 325 mg  325 mg Oral Q breakfast Adefeso, Oladapo, DO   325 mg at 03/24/20 0915  . insulin aspart (novoLOG) injection 0-15 Units  0-15 Units Subcutaneous TID WC Adefeso, Oladapo, DO      . insulin aspart (novoLOG) injection 0-5 Units  0-5 Units Subcutaneous QHS Adefeso, Oladapo, DO      . pantoprazole (PROTONIX) EC tablet 40 mg  40 mg Oral Daily Adefeso, Oladapo, DO   40 mg at 03/24/20 0915   Current Outpatient Medications  Medication Sig Dispense Refill  . acetaminophen (TYLENOL) 650 MG CR tablet Take 1,300 mg by mouth every 8 (eight) hours as needed for pain.    Marland Kitchen albuterol (PROVENTIL HFA;VENTOLIN HFA) 108 (  90 Base) MCG/ACT inhaler TAKE 2 PUFFS BY MOUTH EVERY 6 HOURS AS NEEDED FOR WHEEZE OR SHORTNESS OF BREATH 6.7 Inhaler 2  . ibuprofen (ADVIL) 200 MG tablet Take 600 mg by mouth every 6 (six) hours as needed.    . metFORMIN (GLUCOPHAGE) 500 MG tablet TAKE 1 TABLET BY MOUTH TWICE DAILY WITH A MEAL (Patient taking differently: Take 500 mg by mouth daily with breakfast. ) 180 tablet 0  . Multiple Vitamin (MULTIVITAMIN) tablet Take 1 tablet by mouth daily.    . mupirocin ointment (BACTROBAN) 2 % Place 1 application into the nose 2 (two) times daily.    . Probiotic Product (PROBIOTIC DAILY PO) Take 1 tablet by mouth daily.    Marland Kitchen amoxicillin-clavulanate (AUGMENTIN) 875-125 MG tablet SMARTSIG:1 Tablet(s) By Mouth Every 12 Hours (Patient not taking: Reported on 03/23/2020)    . Blood Glucose Monitoring Suppl (BLOOD GLUCOSE SYSTEM PAK) KIT Please dispense based on patient and insurance preference. Use as directed to monitor FSBS 2x daily. Dx: E11.9 1 kit 1  . doxycycline (VIBRA-TABS) 100 MG tablet Take 1 tablet (100 mg total) by mouth 2 (two) times daily. (Patient not taking: Reported on 03/23/2020) 14 tablet 0   . Glucose Blood (BLOOD GLUCOSE TEST STRIPS) STRP Please dispense based on patient and insurance preference. Use as directed to monitor FSBS 2x daily. Dx: E11.9 100 strip 11  . Lancets MISC Please dispense based on patient and insurance preference. Use as directed to monitor FSBS 2x daily. Dx: E11.9 100 each 11  . sildenafil (VIAGRA) 100 MG tablet One by mouth 30 min prior to activity as needed (Patient not taking: Reported on 03/23/2020) 4 tablet 5    Allergies as of 03/23/2020 - Review Complete 03/23/2020  Allergen Reaction Noted  . Gabapentin Shortness Of Breath 04/28/2017  . Lyrica [pregabalin] Shortness Of Breath 04/28/2017    Family History  Problem Relation Age of Onset  . Depression Mother   . Hearing loss Mother   . Hyperlipidemia Mother   . Hypertension Mother   . Stroke Mother   . Early death Father   . Diabetes Brother     Social History   Socioeconomic History  . Marital status: Married    Spouse name: Not on file  . Number of children: Not on file  . Years of education: Not on file  . Highest education level: Not on file  Occupational History  . Not on file  Tobacco Use  . Smoking status: Current Every Day Smoker    Packs/day: 0.50    Years: 30.00    Pack years: 15.00    Types: Cigarettes  . Smokeless tobacco: Never Used  Substance and Sexual Activity  . Alcohol use: Yes    Comment: moderately  . Drug use: No  . Sexual activity: Yes  Other Topics Concern  . Not on file  Social History Narrative  . Not on file   Social Determinants of Health   Financial Resource Strain:   . Difficulty of Paying Living Expenses: Not on file  Food Insecurity:   . Worried About Charity fundraiser in the Last Year: Not on file  . Ran Out of Food in the Last Year: Not on file  Transportation Needs:   . Lack of Transportation (Medical): Not on file  . Lack of Transportation (Non-Medical): Not on file  Physical Activity:   . Days of Exercise per Week: Not on file  .  Minutes of Exercise per Session: Not on  file  Stress:   . Feeling of Stress : Not on file  Social Connections:   . Frequency of Communication with Friends and Family: Not on file  . Frequency of Social Gatherings with Friends and Family: Not on file  . Attends Religious Services: Not on file  . Active Member of Clubs or Organizations: Not on file  . Attends Archivist Meetings: Not on file  . Marital Status: Not on file  Intimate Partner Violence:   . Fear of Current or Ex-Partner: Not on file  . Emotionally Abused: Not on file  . Physically Abused: Not on file  . Sexually Abused: Not on file    Review of Systems: Gen: Denies fever, chills.  CV: See HPI Resp: See HPI GI: See HPI MS: See HPI Heme: See HPI  Physical Exam: Vital signs in last 24 hours: Temp:  [97.9 F (36.6 C)-98.7 F (37.1 C)] 98 F (36.7 C) (11/09 0606) Pulse Rate:  [37-88] 81 (11/09 0900) Resp:  [13-27] 15 (11/09 0900) BP: (88-143)/(51-99) 136/80 (11/09 0900) SpO2:  [82 %-100 %] 99 % (11/09 0900) Weight:  [104.3 kg] 104.3 kg (11/08 1822)   General:   Alert,  Well-developed, well-nourished, pleasant and cooperative in NAD Head:  Normocephalic and atraumatic. Eyes:  Sclera clear, no icterus.   Conjunctiva pink. Ears:  Normal auditory acuity. Lungs:  Clear throughout to auscultation.   No wheezes, crackles, or rhonchi. No acute distress. Heart:  Regular rate and rhythm; no murmurs, clicks, rubs,  or gallops. Abdomen:  Soft, and nondistended. Mild tenderness to deep palpation in RLQ. Also with mild referred pain to the RLQ when palpating LLQ. Also with minimal rebound when palpating RLQ. No guarding.  No masses, hepatosplenomegaly or hernias noted. Normal bowel sounds.  Rectal:  Deferred Msk:  Symmetrical without gross deformities. Normal posture. Extremities:  Without edema. Neurologic:  Alert and  oriented x4;  grossly normal neurologically. Skin:  Intact without significant lesions or  rashes. Psych: Normal mood and affect.  Intake/Output from previous day: 11/08 0701 - 11/09 0700 In: 970 [I.V.:25; Blood:945] Out: -  Intake/Output this shift: No intake/output data recorded.  Lab Results: Recent Labs    03/23/20 2016 03/24/20 0609  WBC 7.2 5.2  HGB 6.6* 8.4*  HCT 23.0* 28.9*  PLT 287 254   BMET Recent Labs    03/23/20 2016 03/24/20 0609  NA 131* 137  K 3.6 4.3  CL 99 101  CO2 21* 26  GLUCOSE 186* 111*  BUN 21* 15  CREATININE 0.84 0.70  CALCIUM 9.0 9.1   LFT Recent Labs    03/24/20 0609  PROT 7.6  ALBUMIN 3.7  AST 21  ALT 16  ALKPHOS 90  BILITOT 0.5   PT/INR Recent Labs    03/24/20 0609  LABPROT 12.8  INR 1.0   Studies/Results: DG Chest Portable 1 View  Result Date: 03/23/2020 CLINICAL DATA:  Chest pain EXAM: PORTABLE CHEST 1 VIEW COMPARISON:  04/28/2017 FINDINGS: 1841 hours. The lungs are clear without focal pneumonia, edema, pneumothorax or pleural effusion. The cardiopericardial silhouette is within normal limits for size. The visualized bony structures of the thorax show no acute abnormality. IMPRESSION: No active disease. Electronically Signed   By: Misty Stanley M.D.   On: 03/23/2020 18:56    Impression: 54 y.o. year old male medical history significant fortype2 DM, chronic pain and obesity who presents to the emergency department due to 2-3-week onset of generalized weakness associated with shortness of breath on  exertion and some mild chest discomfort which has progressively got worse found to have hemoglobin 6.6 with microcytic indices, ferritin 3, BUN slightly elevated at 21, FOBT negative.  Troponin x2 negative, respiratory panel negative.   Anemia: Hemoglobin 6.6 on admission s/p 2 units PRBCs with hemoglobin 8.4 this morning.  Patient reports 3 days of melena and black emesis about 1 month ago.  No recurrent melena.  No BRBPR.  Chronic history of GERD that is not adequately controlled on Nexium OTC daily.  Reports he used to  take up to 10 Motrin a day.  For the last 2 months, has decreased to 2-3 Motrin daily.  He is not on any antiplatelet or anticoagulation medications.  He does drink about 12 beer a week and also has history of drug use including cocaine with last use a few months ago.  Reports EGD and colonoscopy 20+ years ago with colon polyps.  Most suspicious for PUD, gastritis, esophagitis, duodenitis.  Could also have AVMs and cannot rule out colon polyps or malignancy with iron deficiency anemia. Unfortunately, patient had a full meal this morning. We will start with EGD tomorrow with Dr. Laural Golden. I will also increase PPI to BID, stop oral iron, and start patient on clear liquids.   Dysphagia: 3-year history of intermittent solid food dysphagia following anterior approach neck surgery.  No worsening of symptoms over the years.  He has very careful to chew thoroughly.  He does note foods get hung at times and has to regurgitate them, but not recently.  It is possible that prior surgeries may be the etiology of his dysphagia; however, with chronic uncontrolled GERD, cannot rule out esophageal web, ring, or stricture.  We will add on possible esophageal dilation to EGD planned for tomorrow.  Abdominal pain: Patient reporting new onset of mild right lower quadrant abdominal pain x1 day.  He does note some mild constipation of the last few days with no BM this morning.  On exam, he has mild tenderness to deep palpation in RLQ. Also with mild referred pain to the RLQ when palpating LLQ. Also with minimal rebound when palpating RLQ. Appendix in situ. Doubt acute appendicitis, but would not want to miss this. Will order CT A/P today.   Plan: Clear liquid diet.  NPO at midnight.  IV Protonix BID Stop oral iron for now. Plan for EGD +/- dilation with propofol with Dr. Laural Golden tomorrow. The risks, benefits, and alternatives have been discussed with the patient in detail. The patient states understanding and desires to proceed.   CT A/P with contrast today.     LOS: 0 days    03/24/2020, 9:18 AM   Aliene Altes, [facility]

## 2020-03-24 NOTE — ED Notes (Signed)
2nd unit PRBC transfusion initiated.

## 2020-03-25 ENCOUNTER — Encounter (HOSPITAL_COMMUNITY): Payer: Self-pay | Admitting: Internal Medicine

## 2020-03-25 ENCOUNTER — Observation Stay (HOSPITAL_COMMUNITY): Payer: BC Managed Care – PPO | Admitting: Anesthesiology

## 2020-03-25 ENCOUNTER — Observation Stay (HOSPITAL_COMMUNITY): Payer: BC Managed Care – PPO

## 2020-03-25 ENCOUNTER — Encounter (HOSPITAL_COMMUNITY)
Admission: EM | Disposition: A | Discharge status: Home / Self Care | Payer: Self-pay | Source: Home / Self Care | Attending: Internal Medicine

## 2020-03-25 DIAGNOSIS — K921 Melena: Secondary | ICD-10-CM

## 2020-03-25 DIAGNOSIS — E1165 Type 2 diabetes mellitus with hyperglycemia: Secondary | ICD-10-CM | POA: Diagnosis present

## 2020-03-25 DIAGNOSIS — R0602 Shortness of breath: Secondary | ICD-10-CM | POA: Diagnosis not present

## 2020-03-25 DIAGNOSIS — E119 Type 2 diabetes mellitus without complications: Secondary | ICD-10-CM | POA: Diagnosis not present

## 2020-03-25 DIAGNOSIS — K219 Gastro-esophageal reflux disease without esophagitis: Secondary | ICD-10-CM | POA: Diagnosis present

## 2020-03-25 DIAGNOSIS — K621 Rectal polyp: Secondary | ICD-10-CM | POA: Diagnosis present

## 2020-03-25 DIAGNOSIS — D509 Iron deficiency anemia, unspecified: Secondary | ICD-10-CM | POA: Diagnosis not present

## 2020-03-25 DIAGNOSIS — Z791 Long term (current) use of non-steroidal anti-inflammatories (NSAID): Secondary | ICD-10-CM | POA: Diagnosis not present

## 2020-03-25 DIAGNOSIS — E669 Obesity, unspecified: Secondary | ICD-10-CM | POA: Diagnosis present

## 2020-03-25 DIAGNOSIS — R531 Weakness: Secondary | ICD-10-CM | POA: Diagnosis not present

## 2020-03-25 DIAGNOSIS — G894 Chronic pain syndrome: Secondary | ICD-10-CM | POA: Diagnosis present

## 2020-03-25 DIAGNOSIS — K449 Diaphragmatic hernia without obstruction or gangrene: Secondary | ICD-10-CM | POA: Diagnosis not present

## 2020-03-25 DIAGNOSIS — K5909 Other constipation: Secondary | ICD-10-CM | POA: Diagnosis present

## 2020-03-25 DIAGNOSIS — Z7984 Long term (current) use of oral hypoglycemic drugs: Secondary | ICD-10-CM | POA: Diagnosis not present

## 2020-03-25 DIAGNOSIS — D500 Iron deficiency anemia secondary to blood loss (chronic): Secondary | ICD-10-CM | POA: Diagnosis present

## 2020-03-25 DIAGNOSIS — E871 Hypo-osmolality and hyponatremia: Secondary | ICD-10-CM | POA: Diagnosis not present

## 2020-03-25 DIAGNOSIS — F17210 Nicotine dependence, cigarettes, uncomplicated: Secondary | ICD-10-CM | POA: Diagnosis present

## 2020-03-25 DIAGNOSIS — K635 Polyp of colon: Secondary | ICD-10-CM | POA: Diagnosis not present

## 2020-03-25 DIAGNOSIS — Z20822 Contact with and (suspected) exposure to covid-19: Secondary | ICD-10-CM | POA: Diagnosis present

## 2020-03-25 DIAGNOSIS — R1310 Dysphagia, unspecified: Secondary | ICD-10-CM | POA: Diagnosis present

## 2020-03-25 DIAGNOSIS — Z79899 Other long term (current) drug therapy: Secondary | ICD-10-CM | POA: Diagnosis not present

## 2020-03-25 DIAGNOSIS — Z6833 Body mass index (BMI) 33.0-33.9, adult: Secondary | ICD-10-CM | POA: Diagnosis not present

## 2020-03-25 DIAGNOSIS — D649 Anemia, unspecified: Secondary | ICD-10-CM | POA: Diagnosis present

## 2020-03-25 DIAGNOSIS — Z23 Encounter for immunization: Secondary | ICD-10-CM | POA: Diagnosis not present

## 2020-03-25 DIAGNOSIS — K2970 Gastritis, unspecified, without bleeding: Secondary | ICD-10-CM | POA: Diagnosis not present

## 2020-03-25 DIAGNOSIS — K3189 Other diseases of stomach and duodenum: Secondary | ICD-10-CM

## 2020-03-25 HISTORY — PX: ESOPHAGEAL DILATION: SHX303

## 2020-03-25 HISTORY — PX: ESOPHAGOGASTRODUODENOSCOPY (EGD) WITH PROPOFOL: SHX5813

## 2020-03-25 LAB — CBC
HCT: 30.2 % — ABNORMAL LOW (ref 39.0–52.0)
Hemoglobin: 8.9 g/dL — ABNORMAL LOW (ref 13.0–17.0)
MCH: 22.3 pg — ABNORMAL LOW (ref 26.0–34.0)
MCHC: 29.5 g/dL — ABNORMAL LOW (ref 30.0–36.0)
MCV: 75.7 fL — ABNORMAL LOW (ref 80.0–100.0)
Platelets: 256 10*3/uL (ref 150–400)
RBC: 3.99 MIL/uL — ABNORMAL LOW (ref 4.22–5.81)
RDW: 21.4 % — ABNORMAL HIGH (ref 11.5–15.5)
WBC: 5.1 10*3/uL (ref 4.0–10.5)
nRBC: 0 % (ref 0.0–0.2)

## 2020-03-25 LAB — ECHOCARDIOGRAM COMPLETE
AR max vel: 2.11 cm2
AV Area VTI: 2.15 cm2
AV Area mean vel: 2.37 cm2
AV Mean grad: 3.5 mmHg
AV Peak grad: 7.1 mmHg
Ao pk vel: 1.33 m/s
Area-P 1/2: 3.83 cm2
Height: 69 in
S' Lateral: 3.64 cm
Weight: 3679.04 oz

## 2020-03-25 LAB — BPAM RBC
Blood Product Expiration Date: 202112092359
Blood Product Expiration Date: 202112092359
ISSUE DATE / TIME: 202111090014
ISSUE DATE / TIME: 202111090400
Unit Type and Rh: 6200
Unit Type and Rh: 6200

## 2020-03-25 LAB — GLUCOSE, CAPILLARY
Glucose-Capillary: 95 mg/dL (ref 70–99)
Glucose-Capillary: 84 mg/dL (ref 70–99)
Glucose-Capillary: 82 mg/dL (ref 70–99)
Glucose-Capillary: 73 mg/dL (ref 70–99)
Glucose-Capillary: 64 mg/dL — ABNORMAL LOW (ref 70–99)
Glucose-Capillary: 91 mg/dL (ref 70–99)

## 2020-03-25 LAB — TYPE AND SCREEN
ABO/RH(D): A POS
Antibody Screen: NEGATIVE
Unit division: 0
Unit division: 0

## 2020-03-25 SURGERY — ESOPHAGOGASTRODUODENOSCOPY (EGD) WITH PROPOFOL
Anesthesia: General

## 2020-03-25 MED ORDER — GLYCOPYRROLATE 0.2 MG/ML IJ SOLN: 0.1000 mg | Freq: Once | INTRAMUSCULAR | Status: DC

## 2020-03-25 MED ORDER — GLYCOPYRROLATE 0.2 MG/ML IJ SOLN
INTRAMUSCULAR | Status: AC
  Filled 2020-03-25: qty 1

## 2020-03-25 MED ORDER — PROPOFOL 10 MG/ML IV BOLUS
INTRAVENOUS | Status: DC | PRN
  Administered 2020-03-25: 40 mg via INTRAVENOUS
  Administered 2020-03-25: 80 mg via INTRAVENOUS
  Administered 2020-03-25: 100 mg via INTRAVENOUS

## 2020-03-25 MED ORDER — SODIUM CHLORIDE 0.9 % IV SOLN: INTRAVENOUS | Status: DC

## 2020-03-25 MED ORDER — PHENYLEPHRINE 40 MCG/ML (10ML) SYRINGE FOR IV PUSH (FOR BLOOD PRESSURE SUPPORT)
PREFILLED_SYRINGE | INTRAVENOUS | Status: DC | PRN
  Administered 2020-03-25: 80 ug via INTRAVENOUS

## 2020-03-25 MED ORDER — PEG 3350-KCL-NA BICARB-NACL 420 G PO SOLR
4000.0000 mL | Freq: Once | ORAL | Status: AC
  Administered 2020-03-25: 4000 mL via ORAL

## 2020-03-25 MED ORDER — LACTATED RINGERS IV SOLN: INTRAVENOUS | Status: DC | PRN

## 2020-03-25 MED ORDER — GLYCOPYRROLATE 0.2 MG/ML IJ SOLN
0.2000 mg | Freq: Once | INTRAMUSCULAR | Status: AC
  Administered 2020-03-25: 0.2 mg via INTRAVENOUS

## 2020-03-25 MED ORDER — SODIUM CHLORIDE 0.9 % IV SOLN
125.0000 mg | Freq: Once | INTRAVENOUS | Status: AC
  Administered 2020-03-25: 125 mg via INTRAVENOUS
  Filled 2020-03-25: qty 10

## 2020-03-25 MED ORDER — LIDOCAINE HCL (CARDIAC) PF 100 MG/5ML IV SOSY
PREFILLED_SYRINGE | INTRAVENOUS | Status: DC | PRN
  Administered 2020-03-25: 100 mg via INTRAVENOUS

## 2020-03-25 MED ORDER — LIDOCAINE VISCOUS HCL 2 % MT SOLN
15.0000 mL | Freq: Once | OROMUCOSAL | Status: AC
  Administered 2020-03-25: 15 mL via OROMUCOSAL

## 2020-03-25 MED ORDER — LIDOCAINE VISCOUS HCL 2 % MT SOLN
OROMUCOSAL | Status: AC
  Filled 2020-03-25: qty 15

## 2020-03-25 MED ORDER — NICOTINE 21 MG/24HR TD PT24
21.0000 mg | MEDICATED_PATCH | Freq: Every day | TRANSDERMAL | Status: DC
  Administered 2020-03-25 – 2020-03-26 (×2): 21 mg via TRANSDERMAL
  Filled 2020-03-25 (×2): qty 1

## 2020-03-25 NOTE — Plan of Care (Signed)
  Problem: Education: Goal: Knowledge of General Education information will improve Description Including pain rating scale, medication(s)/side effects and non-pharmacologic comfort measures Outcome: Progressing   Problem: Health Behavior/Discharge Planning: Goal: Ability to manage health-related needs will improve Outcome: Progressing   

## 2020-03-25 NOTE — Anesthesia Procedure Notes (Signed)
Date/Time: 03/25/2020 10:54 AM Performed by: Orlie Dakin, CRNA Pre-anesthesia Checklist: Patient identified, Emergency Drugs available, Suction available and Patient being monitored Patient Re-evaluated:Patient Re-evaluated prior to induction Oxygen Delivery Method: Nasal cannula Induction Type: IV induction Placement Confirmation: positive ETCO2

## 2020-03-25 NOTE — Progress Notes (Addendum)
PROGRESS NOTE    Stephen Roberts  QXI:[id number] DOB: 01-16-1966 DOA: 03/23/2020 PCP: Alycia Rossetti, MD    Chief Complaint  Patient presents with  . cough  . Chest Pain    Brief Narrative:  54 year old gentleman with prior history of type 2 diabetes, obesity, chronic pain syndrome presents to ED with 2 to 3 weeks of generalized weakness, dyspnea on exertion,  chest discomfort on exertion and syncope.  On arrival to ED he was found to have a hemoglobin of 6.6, hematocrit of 23.  FOBT was negative.  Chest x-ray did not show any active disease.  He underwent 2 units of PRBC transfusion and repeat hemoglobin is around 8.  He was admitted to hospitalist service for further evaluation and management.  GI consulted and he is scheduled for an  EGD tomorrow.  In view of his family history of cardiac disease and dyspnea on exertion and echocardiogram ordered for further evaluation.   Assessment & Plan:   Principal Problem:   Symptomatic anemia Active Problems:   Obesity, unspecified   Hyperglycemia   Type 2 diabetes mellitus (HCC)   Generalized weakness   Lightheadedness   Microcytic anemia   Hyponatremia  Generalized weakness and lightheadedness probably secondary to anemia. S/p 2 units of PRBC transfusion and repeat H&H has improved to 8.9 FOBT is negative.  But patient is on chronic NSAID for back pain GI consulted and he underwent EGD on 11/10 that was unrevealing Plan is for colonoscopy on 11/11 Given the severity of his symptoms and his anemia, patient warrants an inpatient work-up Continue with IV PPI twice daily, transfuse PRBC to keep hemoglobin hemoglobin greater than 7.    Microcytic anemia/iron deficiency anemia Iron deficiency, low ferritin levels, iron supplementation added. Will give 1 dose of IV iron    Type 2 diabetes mellitus CBG (last 3)  Recent Labs    03/25/20 1029 03/25/20 1144 03/25/20 1641  GLUCAP 84 82 73   Hemoglobin A1c is 5.6  Dyspnea on  exertion, syncope Echocardiogram unrevealing.  EKG was normal sinus rhythm Troponins are negative Suspect symptoms related to anemia    DVT prophylaxis: SCDs Code Status: Full code Family Communication: Wife at bedside Disposition:   Status is: Inpatient  The patient will require care spanning > 2 midnights and should be moved to inpatient because: Ongoing diagnostic testing needed not appropriate for outpatient work up, Unsafe d/c plan, IV treatments appropriate due to intensity of illness or inability to take PO and Inpatient level of care appropriate due to severity of illness  Dispo: The patient is from: Home              Anticipated d/c is to: Home              Anticipated d/c date is: 1 day              Patient currently is not medically stable to d/c.       Consultants:   Gastroenterology.    Procedures: EGD,   Antimicrobials: none.   Subjective: No nausea or vomiting No abdominal pain  Objective: Vitals:   03/25/20 1107 03/25/20 1115 03/25/20 1234 03/25/20 1501  BP: (!) 87/76 113/73  (!) 132/91  Pulse:  80  70  Resp: 15 20 20 20   Temp: 98.3 F (36.8 C)  98.3 F (36.8 C) 98.4 F (36.9 C)  TempSrc:   Oral Oral  SpO2: 98% 99% 100% 100%  Weight:  Height:        Intake/Output Summary (Last 24 hours) at 03/25/2020 2010 Last data filed at 03/25/2020 1502 Gross per 24 hour  Intake 300 ml  Output 850 ml  Net -550 ml   Filed Weights   03/23/20 1822 03/24/20 1550  Weight: 104.3 kg 104.3 kg    Examination:  General exam: Alert, awake, oriented x 3 Respiratory system: Clear to auscultation. Respiratory effort normal. Cardiovascular system:RRR. No murmurs, rubs, gallops. Gastrointestinal system: Abdomen is nondistended, soft and nontender. No organomegaly or masses felt. Normal bowel sounds heard. Central nervous system: Alert and oriented. No focal neurological deficits. Extremities: No C/C/E, +pedal pulses Skin: No rashes, lesions or  ulcers Psychiatry: Judgement and insight appear normal. Mood & affect appropriate.     Data Reviewed: I have personally reviewed following labs and imaging studies  CBC: Recent Labs  Lab 03/23/20 2016 03/24/20 0609 03/25/20 0936  WBC 7.2 5.2 5.1  NEUTROABS 3.8  --   --   HGB 6.6* 8.4* 8.9*  HCT 23.0* 28.9* 30.2*  MCV 72.8* 75.7* 75.7*  PLT 287 254 259    Basic Metabolic Panel: Recent Labs  Lab 03/23/20 2016 03/24/20 0609  NA 131* 137  K 3.6 4.3  CL 99 101  CO2 21* 26  GLUCOSE 186* 111*  BUN 21* 15  CREATININE 0.84 0.70  CALCIUM 9.0 9.1  MG  --  2.0  PHOS  --  4.1    GFR: Estimated Creatinine Clearance: 122.6 mL/min (by C-G formula based on SCr of 0.7 mg/dL).  Liver Function Tests: Recent Labs  Lab 03/24/20 0609  AST 21  ALT 16  ALKPHOS 90  BILITOT 0.5  PROT 7.6  ALBUMIN 3.7    CBG: Recent Labs  Lab 03/24/20 2038 03/25/20 0743 03/25/20 1029 03/25/20 1144 03/25/20 1641  GLUCAP 116* 95 84 82 73     Recent Results (from the past 240 hour(s))  Resp Panel by RT PCR (RSV, Flu A&B, Covid) - Nasopharyngeal Swab     Status: None   Collection Time: 03/23/20  6:28 PM   Specimen: Nasopharyngeal Swab  Result Value Ref Range Status   SARS Coronavirus 2 by RT PCR NEGATIVE NEGATIVE Final    Comment: (NOTE) SARS-CoV-2 target nucleic acids are NOT DETECTED.  The SARS-CoV-2 RNA is generally detectable in upper respiratoy specimens during the acute phase of infection. The lowest concentration of SARS-CoV-2 viral copies this assay can detect is 131 copies/mL. A negative result does not preclude SARS-Cov-2 infection and should not be used as the sole basis for treatment or other patient management decisions. A negative result may occur with  improper specimen collection/handling, submission of specimen other than nasopharyngeal swab, presence of viral mutation(s) within the areas targeted by this assay, and inadequate number of viral copies (<131  copies/mL). A negative result must be combined with clinical observations, patient history, and epidemiological information. The expected result is Negative.  Fact Sheet for Patients:  PinkCheek.be  Fact Sheet for Healthcare Providers:  GravelBags.it  This test is no t yet approved or cleared by the [place] FDA and  has been authorized for detection and/or diagnosis of SARS-CoV-2 by FDA under an Emergency Use Authorization (EUA). This EUA will remain  in effect (meaning this test can be used) for the duration of the COVID-19 declaration under Section 564(b)(1) of the Act, 21 U.S.C. section 360bbb-3(b)(1), unless the authorization is terminated or revoked sooner.     Influenza A by PCR NEGATIVE NEGATIVE Final  Influenza B by PCR NEGATIVE NEGATIVE Final    Comment: (NOTE) The Xpert Xpress SARS-CoV-2/FLU/RSV assay is intended as an aid in  the diagnosis of influenza from Nasopharyngeal swab specimens and  should not be used as a sole basis for treatment. Nasal washings and  aspirates are unacceptable for Xpert Xpress SARS-CoV-2/FLU/RSV  testing.  Fact Sheet for Patients: PinkCheek.be  Fact Sheet for Healthcare Providers: GravelBags.it  This test is not yet approved or cleared by the [place] FDA and  has been authorized for detection and/or diagnosis of SARS-CoV-2 by  FDA under an Emergency Use Authorization (EUA). This EUA will remain  in effect (meaning this test can be used) for the duration of the  Covid-19 declaration under Section 564(b)(1) of the Act, 21  U.S.C. section 360bbb-3(b)(1), unless the authorization is  terminated or revoked.    Respiratory Syncytial Virus by PCR NEGATIVE NEGATIVE Final    Comment: (NOTE) Fact Sheet for Patients: PinkCheek.be  Fact Sheet for Healthcare  Providers: GravelBags.it  This test is not yet approved or cleared by the [place] FDA and  has been authorized for detection and/or diagnosis of SARS-CoV-2 by  FDA under an Emergency Use Authorization (EUA). This EUA will remain  in effect (meaning this test can be used) for the duration of the  COVID-19 declaration under Section 564(b)(1) of the Act, 21 U.S.C.  section 360bbb-3(b)(1), unless the authorization is terminated or  revoked. Performed at [facility], [address] [place], [place] [address]          Radiology Studies: CT ABDOMEN PELVIS W CONTRAST  Result Date: 03/24/2020 CLINICAL DATA:  Abdominal pain.  Nausea and vomiting with melena. EXAM: CT ABDOMEN AND PELVIS WITH CONTRAST TECHNIQUE: Multidetector CT imaging of the abdomen and pelvis was performed using the standard protocol following bolus administration of intravenous contrast. CONTRAST:  136mL OMNIPAQUE IOHEXOL 300 MG/ML  SOLN COMPARISON:  07/03/2010 FINDINGS: Lower chest: Clear lung bases. Hepatobiliary: No focal liver abnormality is seen. The gallbladder is collapsed. There is no biliary dilatation. Pancreas: Unremarkable. Spleen: Unremarkable. Adrenals/Urinary Tract: Slight right adrenal gland thickening. Unremarkable left adrenal gland. New punctate stone in the lower pole of the right kidney. No hydronephrosis or renal mass. Unremarkable bladder. Stomach/Bowel: There is a large sliding hiatal hernia which is larger than on the prior study. There is no evidence of bowel obstruction or inflammation. The appendix is unremarkable. Vascular/Lymphatic: Mild abdominal aortic atherosclerosis without aneurysm. No enlarged lymph nodes. Reproductive: Unremarkable prostate. Other: No ascites or pneumoperitoneum. Interval ventral hernia repair. Musculoskeletal: Old right-sided rib fractures. Mild lumbar disc and facet degeneration. No suspicious osseous lesion. IMPRESSION: 1. No acute abnormality  identified in the abdomen or pelvis. 2. Large hiatal hernia. 3. Punctate nonobstructing right renal stone. 4. Aortic Atherosclerosis (ICD10-I70.0). Electronically Signed   By: Logan Bores M.D.   On: 03/24/2020 11:59   ECHOCARDIOGRAM COMPLETE  Result Date: 03/25/2020    ECHOCARDIOGRAM REPORT   Patient Name:   Stephen Roberts Date of Exam: 03/25/2020 Medical Rec #:  [id number]        Height:       69.0 in Accession #:    [id number]       Weight:       229.9 lb Date of Birth:  01-04-1966        BSA:          2.192 m Patient Age:    24 years         BP:  113/73 mmHg Patient Gender: M                HR:           80 bpm. Exam Location:  [facility] Procedure: 2D Echo Indications:    Dyspnea 786.09 / R06.00  History:        Patient has prior history of Echocardiogram examinations, most                 recent 04/28/2017. Signs/Symptoms:Chest Pain; Risk                 Factors:Current Smoker, Dyslipidemia and Diabetes.  Sonographer:    Leavy Cella RDCS (AE) Referring Phys: [place]  1. Left ventricular ejection fraction, by estimation, is 55 to 60%. The left ventricle has normal function. The left ventricle has no regional wall motion abnormalities. Left ventricular diastolic parameters are indeterminate.  2. Right ventricular systolic function is normal. The right ventricular size is normal.  3. Left atrial size was moderately dilated.  4. The mitral valve is normal in structure. No evidence of mitral valve regurgitation. No evidence of mitral stenosis.  5. The aortic valve is tricuspid. Aortic valve regurgitation is not visualized. No aortic stenosis is present.  6. The inferior vena cava is normal in size with greater than 50% respiratory variability, suggesting right atrial pressure of 3 mmHg. FINDINGS  Left Ventricle: Left ventricular ejection fraction, by estimation, is 55 to 60%. The left ventricle has normal function. The left ventricle has no regional wall motion  abnormalities. The left ventricular internal cavity size was normal in size. There is  no left ventricular hypertrophy. Left ventricular diastolic parameters are indeterminate. Right Ventricle: The right ventricular size is normal. No increase in right ventricular wall thickness. Right ventricular systolic function is normal. Left Atrium: Left atrial size was moderately dilated. Right Atrium: Right atrial size was normal in size. Pericardium: There is no evidence of pericardial effusion. Mitral Valve: The mitral valve is normal in structure. No evidence of mitral valve regurgitation. No evidence of mitral valve stenosis. Tricuspid Valve: The tricuspid valve is normal in structure. Tricuspid valve regurgitation is not demonstrated. No evidence of tricuspid stenosis. Aortic Valve: The aortic valve is tricuspid. Aortic valve regurgitation is not visualized. No aortic stenosis is present. Aortic valve mean gradient measures 3.5 mmHg. Aortic valve peak gradient measures 7.1 mmHg. Aortic valve area, by VTI measures 2.15 cm. Pulmonic Valve: The pulmonic valve was not well visualized. Pulmonic valve regurgitation is not visualized. No evidence of pulmonic stenosis. Aorta: The aortic root is normal in size and structure. Venous: The inferior vena cava is normal in size with greater than 50% respiratory variability, suggesting right atrial pressure of 3 mmHg. IAS/Shunts: No atrial level shunt detected by color flow Doppler.  LEFT VENTRICLE PLAX 2D LVIDd:         5.44 cm  Diastology LVIDs:         3.64 cm  LV e' medial:    5.22 cm/s LV PW:         1.10 cm  LV E/e' medial:  14.1 LV IVS:        1.09 cm  LV e' lateral:   7.18 cm/s LVOT diam:     1.90 cm  LV E/e' lateral: 10.3 LV SV:         50 LV SV Index:   23 LVOT Area:     2.84 cm  RIGHT VENTRICLE RV S prime:  12.20 cm/s TAPSE (M-mode): 2.4 cm LEFT ATRIUM              Index       RIGHT ATRIUM           Index LA diam:        4.50 cm  2.05 cm/m  RA Area:     16.90 cm LA  Vol (A2C):   59.4 ml  27.10 ml/m RA Volume:   45.40 ml  20.71 ml/m LA Vol (A4C):   101.0 ml 46.07 ml/m LA Biplane Vol: 82.7 ml  37.72 ml/m  AORTIC VALVE AV Area (Vmax):    2.11 cm AV Area (Vmean):   2.37 cm AV Area (VTI):     2.15 cm AV Vmax:           132.91 cm/s AV Vmean:          87.659 cm/s AV VTI:            0.231 m AV Peak Grad:      7.1 mmHg AV Mean Grad:      3.5 mmHg LVOT Vmax:         98.98 cm/s LVOT Vmean:        73.236 cm/s LVOT VTI:          0.175 m LVOT/AV VTI ratio: 0.76  AORTA Ao Root diam: 3.20 cm MITRAL VALVE MV Area (PHT): 3.83 cm    SHUNTS MV Decel Time: 198 msec    Systemic VTI:  0.18 m MV E velocity: 73.70 cm/s  Systemic Diam: 1.90 cm MV A velocity: 65.10 cm/s MV E/A ratio:  1.13 Carlyle Dolly MD Electronically signed by Carlyle Dolly MD Signature Date/Time: 03/25/2020/11:56:00 AM    Final         Scheduled Meds: . docusate sodium  100 mg Oral Daily  . influenza vac split quadrivalent PF  0.5 mL Intramuscular Tomorrow-1000  . insulin aspart  0-15 Units Subcutaneous TID WC  . insulin aspart  0-5 Units Subcutaneous QHS  . nicotine  21 mg Transdermal Daily  . pantoprazole (PROTONIX) IV  40 mg Intravenous Q12H  . pneumococcal 23 valent vaccine  0.5 mL Intramuscular Tomorrow-1000   Continuous Infusions:   LOS: 0 days        Kathie Dike, MD [facility]   To contact the attending provider between 7A-7P or the covering provider during after hours 7P-7A, please log into the web site [email or web address] and access using universal [place] password for that web site. If you do not have the password, please call the hospital operator.  03/25/2020, 8:10 PM

## 2020-03-25 NOTE — Progress Notes (Signed)
*  PRELIMINARY RESULTS* Echocardiogram 2D Echocardiogram has been performed.  Leavy Cella 03/25/2020, 11:45 AM

## 2020-03-25 NOTE — Progress Notes (Signed)
Subjective:  Patient says he feels much better.  He denies chest pain shortness of breath.  He is complaining of pain in right upper quadrant and left lower quadrant.  He states at 1 point he was taking as many as 20 Advil's per day.  Lately been taking no more than 2-3 M alternating with Tylenol.  He has chronic back pain.  He has had 2 back surgeries.  No history of peptic ulcer disease.  He had colonoscopy several years ago but he had Cologuard test 2 years ago and was negative.  Current Medications:  Current Facility-Administered Medications:  .  0.9 %  sodium chloride infusion, , Intravenous, Continuous, Harper, Kristen S, PA-C .  [MAR Hold] acetaminophen (TYLENOL) tablet 650 mg, 650 mg, Oral, Q4H PRN, Hosie Poisson, MD, 650 mg at 03/25/20 0903 .  [MAR Hold] docusate sodium (COLACE) capsule 100 mg, 100 mg, Oral, Daily, Adefeso, Oladapo, DO, 100 mg at 03/25/20 0909 .  influenza vac split quadrivalent PF (FLUARIX) injection 0.5 mL, 0.5 mL, Intramuscular, Tomorrow-1000, Hosie Poisson, MD .  Doug Sou Hold] insulin aspart (novoLOG) injection 0-15 Units, 0-15 Units, Subcutaneous, TID WC, Adefeso, Oladapo, DO .  [MAR Hold] insulin aspart (novoLOG) injection 0-5 Units, 0-5 Units, Subcutaneous, QHS, Adefeso, Oladapo, DO .  [MAR Hold] nicotine (NICODERM CQ - dosed in mg/24 hours) patch 21 mg, 21 mg, Transdermal, Daily, Memon, Jolaine Artist, MD, 21 mg at 03/25/20 0955 .  [MAR Hold] ondansetron (ZOFRAN) injection 4 mg, 4 mg, Intravenous, Q6H PRN, Hosie Poisson, MD .  Doug Sou Hold] pantoprazole (PROTONIX) injection 40 mg, 40 mg, Intravenous, Q12H, Erenest Rasher, PA-C, 40 mg at 03/25/20 0909 .  pneumococcal 23 valent vaccine (PNEUMOVAX-23) injection 0.5 mL, 0.5 mL, Intramuscular, Tomorrow-1000, Akula, Vijaya, MD   Objective: Blood pressure (!) 121/91, pulse 74, temperature 98.7 F (37.1 C), temperature source Oral, resp. rate 18, height '5\' 9"'  (1.753 m), weight 104.3 kg, SpO2 97 %. Patient is alert and in no  acute distress. Conjunctivae is pale.  Sclerae nonicteric. No neck masses or thyromegaly noted. Neck exam with regular rhythm normal S1 and S2.  No murmur gallop noted. Abdomen is full.  On palpation is soft.  He has mild tenderness in right upper quadrant.  He also has mild tenderness in left lower quadrant.  No organomegaly or masses. No peripheral edema or clubbing noted.  Labs/studies Results:  CBC Latest Ref Rng & Units 03/25/2020 03/24/2020 03/23/2020  WBC 4.0 - 10.5 K/uL 5.1 5.2 7.2  Hemoglobin 13.0 - 17.0 g/dL 8.9(L) 8.4(L) 6.6(LL)  Hematocrit 39 - 52 % 30.2(L) 28.9(L) 23.0(L)  Platelets 150 - 400 K/uL 256 254 287    CMP Latest Ref Rng & Units 03/24/2020 03/23/2020 02/13/2019  Glucose 70 - 99 mg/dL 111(H) 186(H) 78  BUN 6 - 20 mg/dL 15 21(H) 15  Creatinine 0.61 - 1.24 mg/dL 0.70 0.84 0.80  Sodium 135 - 145 mmol/L 137 131(L) 139  Potassium 3.5 - 5.1 mmol/L 4.3 3.6 4.7  Chloride 98 - 111 mmol/L 101 99 106  CO2 22 - 32 mmol/L 26 21(L) 27  Calcium 8.9 - 10.3 mg/dL 9.1 9.0 9.6  Total Protein 6.5 - 8.1 g/dL 7.6 - 7.3  Total Bilirubin 0.3 - 1.2 mg/dL 0.5 - 0.3  Alkaline Phos 38 - 126 U/L 90 - -  AST 15 - 41 U/L 21 - 19  ALT 0 - 44 U/L 16 - 16    Hepatic Function Latest Ref Rng & Units 03/24/2020 02/13/2019 09/04/2017  Total Protein  6.5 - 8.1 g/dL 7.6 7.3 7.5  Albumin 3.5 - 5.0 g/dL 3.7 - -  AST 15 - 41 U/L '21 19 19  ' ALT 0 - 44 U/L '16 16 21  ' Alk Phosphatase 38 - 126 U/L 90 - -  Total Bilirubin 0.3 - 1.2 mg/dL 0.5 0.3 0.3      Assessment:  #1.  Upper GI bleed.  Patient presents with melena and anemia.  He has received 2 units of PRBC.  Given chronic NSAID use suspect he has peptic ulcer disease.  He is he dynamically stable.  #2.  Chronic low back pain.  #3.  Diabetes mellitus.  #4.  Anemia secondary to GI blood loss.  Hemoglobin is increased by more than 3 g to  8.9 g with 2 units of PRBCs.  #5.  History of dysphagia.  He feels is related to endotracheal intubation but he is  now better.  Will dilate his esophagus if he has obvious stricture otherwise not.   Plan:  Proceed with esophagogastroduodenoscopy with therapeutic intervention. Patient's questions answered.  He is agreeable proceed with esophagogastroduodenoscopy

## 2020-03-25 NOTE — Progress Notes (Signed)
Brief EGD note.  Normal hypopharynx. Normal mucosa of the esophagus. Large sliding hiatal hernia measuring 7 cm in length. Focal edema and erythema involving gastric folds at the level of hiatus. Normal mucosa gastric antrum pyloric channel as well as fundus and cardia. Normal bulbar and post bulbar mucosa.

## 2020-03-25 NOTE — Progress Notes (Signed)
GI Inpatient Follow-up Note   Subjective: Reports overall feels much better compared to when admitted - SOB and chest pain drastically better after receiving transfusion. Feels protonix held his mid abd pain and nausea significantly. Had normal BM without obvious blood overnight.  He reports 3-4 weeks ago developing 2 day period of melena and vomiting w/ abd pain, this is around the time chronic SOB and weakness he had had over past several months worsened. Does note significant moonshine intake night prior to onset of symptoms. Overall reports weakness, lightheadedness, etc over past 6-7 months.  Chronically takes nexium 20mg  daily. Smokes 1PPD. Drinks 6-12 beers/week. Usually takes motrin 2-3x/day for back pain but has used up to 15-20 motrin per day in past.   Wife at bedside & helps w/ hx.  Scheduled Inpatient Medications:  . docusate sodium  100 mg Oral Daily  . influenza vac split quadrivalent PF  0.5 mL Intramuscular Tomorrow-1000  . insulin aspart  0-15 Units Subcutaneous TID WC  . insulin aspart  0-5 Units Subcutaneous QHS  . pantoprazole (PROTONIX) IV  40 mg Intravenous Q12H  . pneumococcal 23 valent vaccine  0.5 mL Intramuscular Tomorrow-1000    Continuous Inpatient Infusions:    PRN Inpatient Medications:  acetaminophen, ondansetron (ZOFRAN) IV  Review of Systems: Constitutional: Weight is stable.  Eyes: No changes in vision. ENT: No oral lesions, sore throat.  GI: see HPI.  Heme/Lymph: No easy bruising.  CV: No chest pain.  GU: No hematuria.  Integumentary: No rashes.  Neuro: No headaches.  Psych: No depression/anxiety.  Endocrine: No heat/cold intolerance.  Allergic/Immunologic: No urticaria.  Resp: No cough, SOB.  Musculoskeletal: No joint swelling.    Physical Examination: BP (!) 141/106 (BP Location: Left Arm)   Pulse 74   Temp 98.4 F (36.9 C) (Oral)   Resp 20   Ht 5\' 9"  (1.753 m)   Wt 104.3 kg   SpO2 98%   BMI 33.96 kg/m  Gen: NAD, alert and  oriented x 4 HEENT: PEERLA, EOMI, Neck: supple, no JVD or thyromegaly Chest: CTA bilaterally, no wheezes, crackles, or other adventitious sounds CV: RRR, no m/g/c/r Abd: soft, mild TTP epigastric and periumbilical, ND, +BS in all four quadrants; no HSM, guarding, ridigity, or rebound tenderness Ext: no edema, well perfused with 2+ pulses, Skin: no rash or lesions noted Lymph: no LAD  Data: Lab Results  Component Value Date   WBC 5.2 03/24/2020   HGB 8.4 (L) 03/24/2020   HCT 28.9 (L) 03/24/2020   MCV 75.7 (L) 03/24/2020   PLT 254 03/24/2020   Recent Labs  Lab 03/23/20 2016 03/24/20 0609  HGB 6.6* 8.4*   Lab Results  Component Value Date   NA 137 03/24/2020   K 4.3 03/24/2020   CL 101 03/24/2020   CO2 26 03/24/2020   BUN 15 03/24/2020   CREATININE 0.70 03/24/2020   Lab Results  Component Value Date   ALT 16 03/24/2020   AST 21 03/24/2020   ALKPHOS 90 03/24/2020   BILITOT 0.5 03/24/2020   Recent Labs  Lab 03/24/20 0609  APTT 27  INR 1.0   CT a/p yesterday IMPRESSION: 1. No acute abnormality identified in the abdomen or pelvis. 2. Large hiatal hernia. 3. Punctate nonobstructing right renal stone. 4. Aortic Atherosclerosis (ICD10-I70.0).    Assessment/Plan: Mr. Beverlin is a 54 y.o. male with past medical history of diabetes, chronic pain and obesity admitted March 23, 2020 for weakness and chest pain found to have severe anemia  with hemoglobin 6.6  1.  Anemia-received 2 units and hemoglobin has improved from 6.6 on admission to 8.4 yesterday.  Will order CBC for this morning for comparison.  His baseline about a year ago was 12.1.  He is microcytic.  Reports 1 episode 3-4 weeks ago of melena & vomiting after heavy alcohol intake but otherwise denies any significant GI sx. Planning for endoscopy today.  Continue PPI twice a day. Suspect based on history gastritis, PUD, etc. Will need to limit nsaids and alcohol on discharge.   2. Hx of colon polyps - history  of "bunch of polyps" 20 years ago on colonoscopy. Negative cologuard 2 years ago. Address as outpatient but given strong history of polyps would consider repeat colonoscopy as outpatient.  3. Chronic constipation - continue stool softener.   4. Abd pain - suspect due to gastritis, PUD, etc. CT yesterday as above. Improves w/ PPI significantly which he will continue.   Recommendations: 1. NPO for EGD 2. Continue PPI BID until EGD 3. Check H/H prior to EGD.   Patient denies CP, SOB, and use of blood thinners. I discussed the risks and benefits of procedure including bleeding, perforation, infection, missed lesions, medication reactions and possible hospitalization or surgery if complications. All questions answered.    Care to be discussed w/ Dr Laural Golden. Please call with questions or concerns.    Ronney Asters, PA-C [facility]

## 2020-03-25 NOTE — H&P (View-Only) (Signed)
Brief EGD note.  Normal hypopharynx. Normal mucosa of the esophagus. Large sliding hiatal hernia measuring 7 cm in length. Focal edema and erythema involving gastric folds at the level of hiatus. Normal mucosa gastric antrum pyloric channel as well as fundus and cardia. Normal bulbar and post bulbar mucosa.

## 2020-03-25 NOTE — Anesthesia Postprocedure Evaluation (Signed)
Anesthesia Post Note  Patient: Stephen Roberts  Procedure(s) Performed: ESOPHAGOGASTRODUODENOSCOPY (EGD) WITH PROPOFOL (N/A ) ESOPHAGEAL DILATION (N/A )  Patient location during evaluation: PACU Anesthesia Type: General Level of consciousness: awake and alert and oriented Pain management: pain level controlled Vital Signs Assessment: post-procedure vital signs reviewed and stable Respiratory status: spontaneous breathing, nonlabored ventilation and respiratory function stable Cardiovascular status: blood pressure returned to baseline and stable Postop Assessment: no apparent nausea or vomiting Anesthetic complications: no   No complications documented.   Last Vitals:  Vitals:   03/25/20 1107 03/25/20 1115  BP: (!) 87/76 113/73  Pulse:  80  Resp: 15 20  Temp: 36.8 C   SpO2: 98% 99%    Last Pain:  Vitals:   03/25/20 1115  TempSrc:   PainSc: 0-No pain                 Orlie Dakin

## 2020-03-25 NOTE — Transfer of Care (Signed)
Immediate Anesthesia Transfer of Care Note  Patient: Stephen Roberts  Procedure(s) Performed: ESOPHAGOGASTRODUODENOSCOPY (EGD) WITH PROPOFOL (N/A ) ESOPHAGEAL DILATION (N/A )  Patient Location: PACU  Anesthesia Type:General  Level of Consciousness: awake, alert  and oriented  Airway & Oxygen Therapy: Patient Spontanous Breathing  Post-op Assessment: Report given to RN, Post -op Vital signs reviewed and stable and Patient moving all extremities X 4  Post vital signs: Reviewed and stable  Last Vitals:  Vitals Value Taken Time  BP    Temp    Pulse    Resp    SpO2      Last Pain:  Vitals:   03/25/20 1048  TempSrc:   PainSc: 4       Patients Stated Pain Goal: 8 (67/70/34 0352)  Complications: No complications documented.

## 2020-03-25 NOTE — Anesthesia Preprocedure Evaluation (Addendum)
Anesthesia Evaluation  Patient identified by MRN, date of birth, ID band Patient awake    Reviewed: Allergy & Precautions, NPO status , Patient's Chart, lab work & pertinent test results  Airway Mallampati: II  TM Distance: >3 FB Neck ROM: Full    Dental  (+) Dental Advisory Given, Missing, Chipped,    Pulmonary sleep apnea , pneumonia, resolved, Current Smoker and Patient abstained from smoking.,    Pulmonary exam normal breath sounds clear to auscultation       Cardiovascular Exercise Tolerance: Good Normal cardiovascular exam Rhythm:Regular Rate:Normal  Left ventricle: The cavity size was normal. Wall thickness was  increased in a pattern of mild LVH. Systolic function was normal.  The estimated ejection fraction was in the range of 60% to 65%.  Wall motion was normal; there were no regional wall motion  abnormalities. Left ventricular diastolic function parameters  were normal.  - Aortic valve: Mildly calcified annulus. Trileaflet; normal  thickness leaflets. Valve area (VTI): 2.6 cm^2. Valve area  (Vmax): 2.36 cm^2. Valve area (Vmean): 2.3 cm^2.  - Left atrium: The atrium was moderately dilated.  - Technically adequate study.    Neuro/Psych  Neuromuscular disease    GI/Hepatic Neg liver ROS, GERD  ,Upper GI bleed   Endo/Other  diabetes, Well Controlled, Type 2  Renal/GU      Musculoskeletal  (+) Arthritis ,   Abdominal   Peds  Hematology  (+) anemia ,   Anesthesia Other Findings   Reproductive/Obstetrics                            Anesthesia Physical Anesthesia Plan  ASA: III  Anesthesia Plan: General   Post-op Pain Management:    Induction: Intravenous  PONV Risk Score and Plan: TIVA  Airway Management Planned: Nasal Cannula and Natural Airway  Additional Equipment:   Intra-op Plan:   Post-operative Plan:   Informed Consent: I have reviewed the  patients History and Physical, chart, labs and discussed the procedure including the risks, benefits and alternatives for the proposed anesthesia with the patient or authorized representative who has indicated his/her understanding and acceptance.     Dental advisory given  Plan Discussed with: CRNA and Surgeon  Anesthesia Plan Comments:         Anesthesia Quick Evaluation

## 2020-03-25 NOTE — Op Note (Signed)
[facility] Patient Name: Stephen Roberts Procedure Date: 03/25/2020 10:47 AM MRN: [id number] Date of Birth: 05-15-1966 Attending MD: Hildred Laser , MD CSN: [id number] Age: 54 Admit Type: Inpatient Procedure:                Upper GI endoscopy Indications:              Melena Providers:                Hildred Laser, MD, Jeanann Lewandowsky. Sharon Seller, RN, Randa Spike, Technician Referring MD:             Kathie Dike, MD Medicines:                Propofol per Anesthesia Complications:            No immediate complications. Estimated Blood Loss:     Estimated blood loss: none. Procedure:                Pre-Anesthesia Assessment:                           - Prior to the procedure, a History and Physical                            was performed, and patient medications and                            allergies were reviewed. The patient's tolerance of                            previous anesthesia was also reviewed. The risks                            and benefits of the procedure and the sedation                            options and risks were discussed with the patient.                            All questions were answered, and informed consent                            was obtained. Prior Anticoagulants: The patient has                            taken no previous anticoagulant or antiplatelet                            agents except for NSAID medication. ASA Grade                            Assessment: III - A patient with severe systemic  disease. After reviewing the risks and benefits,                            the patient was deemed in satisfactory condition to                            undergo the procedure.                           After obtaining informed consent, the endoscope was                            passed under direct vision. Throughout the                            procedure, the patient's blood pressure,  pulse, and                            oxygen saturations were monitored continuously. The                            [id number]-[id number] ([id number]) scope was introduced through the                            mouth, and advanced to the second part of duodenum.                            The upper GI endoscopy was accomplished without                            difficulty. The patient tolerated the procedure                            well. Scope In: 10:53:18 AM Scope Out: 11:00:23 AM Total Procedure Duration: 0 hours 7 minutes 5 seconds  Findings:      The hypopharynx was normal.      The examined esophagus was normal.      The Z-line was regular and was found 35 cm from the incisors.      A 7 cm hiatal hernia was present.      Localized mildly erythematous mucosa without bleeding at the level of       hiatus..      The exam of the stomach was otherwise normal.      The duodenal bulb and second portion of the duodenum were normal. Impression:               - Normal hypopharynx.                           - Normal esophagus.                           - Z-line regular, 35 cm from the incisors.                           - 7 cm hiatal  hernia.                           - Erythematous gastric mucosa at the level of                            hiatus..                           - Normal duodenal bulb and second portion of the                            duodenum.                           - No specimens collected.                           Comment: No structural abnormality noted to                            esophagus. Therefore was not dilated.                           Sliding hiatal hernia is large. Moderate Sedation:      Per Anesthesia Care Recommendation:           - Return patient to hospital ward for ongoing care.                           - Clear liquid diet today.                           - Continue present medications.                           - Perform a colonoscopy tomorrow. Procedure  Code(s):        --- Professional ---                           [phone], Esophagogastroduodenoscopy, flexible,                            transoral; diagnostic, including collection of                            specimen(s) by brushing or washing, when performed                            (separate procedure) Diagnosis Code(s):        --- Professional ---                           K44.9, Diaphragmatic hernia without obstruction or                            gangrene  K31.89, Other diseases of stomach and duodenum                           K92.1, Melena (includes Hematochezia) CPT copyright 2019 [employer]. All rights reserved. The codes documented in this report are preliminary and upon coder review may  be revised to meet current compliance requirements. Hildred Laser, MD Hildred Laser, MD 03/25/2020 11:12:35 AM This report has been signed electronically. Number of Addenda: 0

## 2020-03-26 ENCOUNTER — Inpatient Hospital Stay (HOSPITAL_COMMUNITY): Payer: BC Managed Care – PPO | Admitting: Certified Registered"

## 2020-03-26 ENCOUNTER — Encounter (HOSPITAL_COMMUNITY)
Admission: EM | Disposition: A | Discharge status: Home / Self Care | Payer: Self-pay | Source: Home / Self Care | Attending: Internal Medicine

## 2020-03-26 DIAGNOSIS — K635 Polyp of colon: Secondary | ICD-10-CM

## 2020-03-26 DIAGNOSIS — K621 Rectal polyp: Secondary | ICD-10-CM

## 2020-03-26 HISTORY — PX: POLYPECTOMY: SHX5525

## 2020-03-26 HISTORY — PX: COLONOSCOPY WITH PROPOFOL: SHX5780

## 2020-03-26 LAB — GLUCOSE, CAPILLARY
Glucose-Capillary: 103 mg/dL — ABNORMAL HIGH (ref 70–99)
Glucose-Capillary: 65 mg/dL — ABNORMAL LOW (ref 70–99)
Glucose-Capillary: 126 mg/dL — ABNORMAL HIGH (ref 70–99)
Glucose-Capillary: 141 mg/dL — ABNORMAL HIGH (ref 70–99)

## 2020-03-26 LAB — CBC
HCT: 27.8 % — ABNORMAL LOW (ref 39.0–52.0)
Hemoglobin: 8 g/dL — ABNORMAL LOW (ref 13.0–17.0)
MCH: 21.9 pg — ABNORMAL LOW (ref 26.0–34.0)
MCHC: 28.8 g/dL — ABNORMAL LOW (ref 30.0–36.0)
MCV: 76.2 fL — ABNORMAL LOW (ref 80.0–100.0)
Platelets: 233 10*3/uL (ref 150–400)
RBC: 3.65 MIL/uL — ABNORMAL LOW (ref 4.22–5.81)
RDW: 21.2 % — ABNORMAL HIGH (ref 11.5–15.5)
WBC: 5.1 10*3/uL (ref 4.0–10.5)
nRBC: 0 % (ref 0.0–0.2)

## 2020-03-26 SURGERY — COLONOSCOPY WITH PROPOFOL
Anesthesia: General

## 2020-03-26 MED ORDER — FERROUS SULFATE 325 (65 FE) MG PO TBEC: 325.0000 mg | DELAYED_RELEASE_TABLET | Freq: Two times a day (BID) | ORAL | 3 refills | Status: DC

## 2020-03-26 MED ORDER — STERILE WATER FOR IRRIGATION IR SOLN
Status: DC | PRN
  Administered 2020-03-26: 100 mL

## 2020-03-26 MED ORDER — PROPOFOL 10 MG/ML IV BOLUS
INTRAVENOUS | Status: DC | PRN
  Administered 2020-03-26: 150 ug/kg/min via INTRAVENOUS
  Administered 2020-03-26: 80 mg via INTRAVENOUS

## 2020-03-26 MED ORDER — DEXTROSE 50 % IV SOLN
INTRAVENOUS | Status: AC
  Administered 2020-03-26: 50 mL
  Filled 2020-03-26: qty 50

## 2020-03-26 MED ORDER — LACTATED RINGERS IV SOLN: INTRAVENOUS | Status: DC | PRN

## 2020-03-26 MED ORDER — LIDOCAINE HCL (CARDIAC) PF 100 MG/5ML IV SOSY
PREFILLED_SYRINGE | INTRAVENOUS | Status: DC | PRN
  Administered 2020-03-26: 100 mg via INTRAVENOUS

## 2020-03-26 MED ORDER — SODIUM CHLORIDE 0.9 % IV SOLN: INTRAVENOUS | Status: DC

## 2020-03-26 MED ORDER — DOCUSATE SODIUM 100 MG PO CAPS: 100.0000 mg | ORAL_CAPSULE | Freq: Two times a day (BID) | ORAL | 2 refills | Status: DC

## 2020-03-26 MED ORDER — PANTOPRAZOLE SODIUM 40 MG PO TBEC: 40.0000 mg | DELAYED_RELEASE_TABLET | Freq: Every day | ORAL | 1 refills | Status: DC

## 2020-03-26 NOTE — Anesthesia Preprocedure Evaluation (Signed)
Anesthesia Evaluation  Patient identified by MRN, date of birth, ID band Patient awake    Reviewed: Allergy & Precautions, NPO status , Patient's Chart, lab work & pertinent test results  History of Anesthesia Complications Negative for: history of anesthetic complications  Airway Mallampati: II  TM Distance: >3 FB Neck ROM: Full    Dental  (+) Dental Advisory Given, Missing, Chipped,    Pulmonary sleep apnea , pneumonia, resolved, Current Smoker and Patient abstained from smoking.,    Pulmonary exam normal breath sounds clear to auscultation       Cardiovascular Exercise Tolerance: Good Normal cardiovascular exam+ Valvular Problems/Murmurs  Rhythm:Regular Rate:Normal  Left ventricle: The cavity size was normal. Wall thickness was  increased in a pattern of mild LVH. Systolic function was normal.  The estimated ejection fraction was in the range of 60% to 65%.  Wall motion was normal; there were no regional wall motion  abnormalities. Left ventricular diastolic function parameters  were normal.  - Aortic valve: Mildly calcified annulus. Trileaflet; normal  thickness leaflets. Valve area (VTI): 2.6 cm^2. Valve area  (Vmax): 2.36 cm^2. Valve area (Vmean): 2.3 cm^2.  - Left atrium: The atrium was moderately dilated.  - Technically adequate study.    Neuro/Psych  Neuromuscular disease negative psych ROS   GI/Hepatic Neg liver ROS, GERD  Medicated,Upper GI bleed   Endo/Other  diabetes, Well Controlled, Type 2, Insulin Dependent  Renal/GU      Musculoskeletal  (+) Arthritis ,   Abdominal   Peds  Hematology  (+) anemia ,   Anesthesia Other Findings   Reproductive/Obstetrics                             Anesthesia Physical  Anesthesia Plan  ASA: III  Anesthesia Plan: General   Post-op Pain Management:    Induction: Intravenous  PONV Risk Score and Plan: TIVA  Airway  Management Planned: Nasal Cannula and Natural Airway  Additional Equipment:   Intra-op Plan:   Post-operative Plan:   Informed Consent: I have reviewed the patients History and Physical, chart, labs and discussed the procedure including the risks, benefits and alternatives for the proposed anesthesia with the patient or authorized representative who has indicated his/her understanding and acceptance.     Dental advisory given  Plan Discussed with: CRNA and Surgeon  Anesthesia Plan Comments:         Anesthesia Quick Evaluation

## 2020-03-26 NOTE — Discharge Summary (Signed)
Physician Discharge Summary  Stephen Roberts EXB:[id number] DOB: [birth year] DOA: 03/23/2020  PCP: Alycia Rossetti, MD  Admit date: 03/23/2020 Discharge date: 03/26/2020  Admitted From: Home Disposition: Home  Recommendations for Outpatient Follow-up:  1. Follow up with PCP in 1-2 weeks 2. Please obtain BMP/CBC in one week 3. Patient will be followed up by GI service for outpatient capsule endoscopy study   Discharge Condition: Stable CODE STATUS: Full code Diet recommendation: Heart healthy, carb modified  Brief/Interim Summary: 54 year old gentleman with prior history of type 2 diabetes, obesity, chronic pain syndrome presents to ED with 2 to 3 weeks of generalized weakness, dyspnea on exertion,  chest discomfort on exertion and syncope.  On arrival to ED he was found to have a hemoglobin of 6.6, hematocrit of 23.  FOBT was negative.  Chest x-ray did not show any active disease.  He underwent 2 units of PRBC transfusion and repeat hemoglobin is around 8.  He was admitted to hospitalist service for further evaluation and management.  GI consulted and he is scheduled for an  EGD tomorrow.  In view of his family history of cardiac disease and dyspnea on exertion and echocardiogram ordered for further evaluation  Discharge Diagnoses:  Principal Problem:   Symptomatic anemia Active Problems:   Obesity, unspecified   Hyperglycemia   Type 2 diabetes mellitus (HCC)   Generalized weakness   Lightheadedness   Microcytic anemia   Hyponatremia  1. Symptomatic anemia. Patient found to have severe microcytic anemia with a hemoglobin of 6.4. He was noted to be iron deficient. He had not noted any blood in stools and FOBT was negative, but he did admit to chronic NSAID use. He was seen by gastroenterology and underwent EGD as well as colonoscopy did not show any signs of source of bleeding. Plan will be for outpatient capsule endoscopy. He did receive 2 units of PRBC with improvement of  hemoglobin. He also received a dose of IV iron and has been started on oral iron replacement therapy. He should have repeat CBC in 1 week to ensure hemoglobin is stable. If capsule endoscopy is unrevealing, he will likely need referral to hematology to work-up other causes for microcytic anemia. 2. Diabetes. A1c of 5.6. Currently stable 3. Dyspnea on exertion. Related to significant anemia. Echocardiogram unrevealing. Troponins negative. 4. GERD. Advised to abstain from further NSAID use. He has been started on PPI.  Discharge Instructions  Discharge Instructions    Diet - low sodium heart healthy   Complete by: As directed    Increase activity slowly   Complete by: As directed      Allergies as of 03/26/2020      Reactions   Gabapentin Shortness Of Breath   Lyrica [pregabalin] Shortness Of Breath      Medication List    STOP taking these medications   amoxicillin-clavulanate 875-125 MG tablet Commonly known as: AUGMENTIN   doxycycline 100 MG tablet Commonly known as: VIBRA-TABS   ibuprofen 200 MG tablet Commonly known as: ADVIL     TAKE these medications   acetaminophen 650 MG CR tablet Commonly known as: TYLENOL Take 1,300 mg by mouth every 8 (eight) hours as needed for pain.   albuterol 108 (90 Base) MCG/ACT inhaler Commonly known as: VENTOLIN HFA TAKE 2 PUFFS BY MOUTH EVERY 6 HOURS AS NEEDED FOR WHEEZE OR SHORTNESS OF BREATH   Blood Glucose System Pak Kit Please dispense based on patient and insurance preference. Use as directed to monitor FSBS 2x daily.  Dx: E11.9   BLOOD GLUCOSE TEST STRIPS Strp Please dispense based on patient and insurance preference. Use as directed to monitor FSBS 2x daily. Dx: E11.9   docusate sodium 100 MG capsule Commonly known as: Colace Take 1 capsule (100 mg total) by mouth 2 (two) times daily.   ferrous sulfate 325 (65 FE) MG EC tablet Take 1 tablet (325 mg total) by mouth 2 (two) times daily.   Lancets Misc Please dispense  based on patient and insurance preference. Use as directed to monitor FSBS 2x daily. Dx: E11.9   metFORMIN 500 MG tablet Commonly known as: GLUCOPHAGE TAKE 1 TABLET BY MOUTH TWICE DAILY WITH A MEAL What changed: See the new instructions.   multivitamin tablet Take 1 tablet by mouth daily.   mupirocin ointment 2 % Commonly known as: BACTROBAN Place 1 application into the nose 2 (two) times daily.   pantoprazole 40 MG tablet Commonly known as: Protonix Take 1 tablet (40 mg total) by mouth daily.   PROBIOTIC DAILY PO Take 1 tablet by mouth daily.   sildenafil 100 MG tablet Commonly known as: VIAGRA One by mouth 30 min prior to activity as needed       Follow-up Information    Rourk, Cristopher Estimable, MD Follow up.   Specialty: Gastroenterology Why: office will contact you for capsule study Contact information: [place] [address] [phone]        Alycia Rossetti, MD. Schedule an appointment as soon as possible for a visit in 1 week(s).   Specialty: Family Medicine Why: recheck hemoglobin on follow up Contact information: [place] [address] [phone]              Allergies  Allergen Reactions  . Gabapentin Shortness Of Breath  . Lyrica [Pregabalin] Shortness Of Breath    Consultations:  Gastroenterology   Procedures/Studies: CT ABDOMEN PELVIS W CONTRAST  Result Date: 03/24/2020 CLINICAL DATA:  Abdominal pain.  Nausea and vomiting with melena. EXAM: CT ABDOMEN AND PELVIS WITH CONTRAST TECHNIQUE: Multidetector CT imaging of the abdomen and pelvis was performed using the standard protocol following bolus administration of intravenous contrast. CONTRAST:  15m OMNIPAQUE IOHEXOL 300 MG/ML  SOLN COMPARISON:  07/03/2010 FINDINGS: Lower chest: Clear lung bases. Hepatobiliary: No focal liver abnormality is seen. The gallbladder is collapsed. There is no biliary dilatation. Pancreas: Unremarkable. Spleen: Unremarkable.  Adrenals/Urinary Tract: Slight right adrenal gland thickening. Unremarkable left adrenal gland. New punctate stone in the lower pole of the right kidney. No hydronephrosis or renal mass. Unremarkable bladder. Stomach/Bowel: There is a large sliding hiatal hernia which is larger than on the prior study. There is no evidence of bowel obstruction or inflammation. The appendix is unremarkable. Vascular/Lymphatic: Mild abdominal aortic atherosclerosis without aneurysm. No enlarged lymph nodes. Reproductive: Unremarkable prostate. Other: No ascites or pneumoperitoneum. Interval ventral hernia repair. Musculoskeletal: Old right-sided rib fractures. Mild lumbar disc and facet degeneration. No suspicious osseous lesion. IMPRESSION: 1. No acute abnormality identified in the abdomen or pelvis. 2. Large hiatal hernia. 3. Punctate nonobstructing right renal stone. 4. Aortic Atherosclerosis (ICD10-I70.0). Electronically Signed   By: ALogan BoresM.D.   On: 03/24/2020 11:59   DG Chest Portable 1 View  Result Date: 03/23/2020 CLINICAL DATA:  Chest pain EXAM: PORTABLE CHEST 1 VIEW COMPARISON:  04/28/2017 FINDINGS: 1841 hours. The lungs are clear without focal pneumonia, edema, pneumothorax or pleural effusion. The cardiopericardial silhouette is within normal limits for size. The visualized bony structures of  the thorax show no acute abnormality. IMPRESSION: No active disease. Electronically Signed   By: Misty Stanley M.D.   On: 03/23/2020 18:56   ECHOCARDIOGRAM COMPLETE  Result Date: 03/25/2020    ECHOCARDIOGRAM REPORT   Patient Name:   BANDY HONAKER Date of Exam: 03/25/2020 Medical Rec #:  [id number]        Height:       69.0 in Accession #:    [id number]       Weight:       229.9 lb Date of Birth:  [birth year]        BSA:          2.192 m Patient Age:    47 years         BP:           113/73 mmHg Patient Gender: M                HR:           80 bpm. Exam Location:  [facility] Procedure: 2D Echo Indications:    Dyspnea  786.09 / R06.00  History:        Patient has prior history of Echocardiogram examinations, most                 recent 04/28/2017. Signs/Symptoms:Chest Pain; Risk                 Factors:Current Smoker, Dyslipidemia and Diabetes.  Sonographer:    Leavy Cella RDCS (AE) Referring Phys: [place]  1. Left ventricular ejection fraction, by estimation, is 55 to 60%. The left ventricle has normal function. The left ventricle has no regional wall motion abnormalities. Left ventricular diastolic parameters are indeterminate.  2. Right ventricular systolic function is normal. The right ventricular size is normal.  3. Left atrial size was moderately dilated.  4. The mitral valve is normal in structure. No evidence of mitral valve regurgitation. No evidence of mitral stenosis.  5. The aortic valve is tricuspid. Aortic valve regurgitation is not visualized. No aortic stenosis is present.  6. The inferior vena cava is normal in size with greater than 50% respiratory variability, suggesting right atrial pressure of 3 mmHg. FINDINGS  Left Ventricle: Left ventricular ejection fraction, by estimation, is 55 to 60%. The left ventricle has normal function. The left ventricle has no regional wall motion abnormalities. The left ventricular internal cavity size was normal in size. There is  no left ventricular hypertrophy. Left ventricular diastolic parameters are indeterminate. Right Ventricle: The right ventricular size is normal. No increase in right ventricular wall thickness. Right ventricular systolic function is normal. Left Atrium: Left atrial size was moderately dilated. Right Atrium: Right atrial size was normal in size. Pericardium: There is no evidence of pericardial effusion. Mitral Valve: The mitral valve is normal in structure. No evidence of mitral valve regurgitation. No evidence of mitral valve stenosis. Tricuspid Valve: The tricuspid valve is normal in structure. Tricuspid valve regurgitation is  not demonstrated. No evidence of tricuspid stenosis. Aortic Valve: The aortic valve is tricuspid. Aortic valve regurgitation is not visualized. No aortic stenosis is present. Aortic valve mean gradient measures 3.5 mmHg. Aortic valve peak gradient measures 7.1 mmHg. Aortic valve area, by VTI measures 2.15 cm. Pulmonic Valve: The pulmonic valve was not well visualized. Pulmonic valve regurgitation is not visualized. No evidence of pulmonic stenosis. Aorta: The aortic root is normal in size and structure. Venous: The inferior vena cava is normal in  size with greater than 50% respiratory variability, suggesting right atrial pressure of 3 mmHg. IAS/Shunts: No atrial level shunt detected by color flow Doppler.  LEFT VENTRICLE PLAX 2D LVIDd:         5.44 cm  Diastology LVIDs:         3.64 cm  LV e' medial:    5.22 cm/s LV PW:         1.10 cm  LV E/e' medial:  14.1 LV IVS:        1.09 cm  LV e' lateral:   7.18 cm/s LVOT diam:     1.90 cm  LV E/e' lateral: 10.3 LV SV:         50 LV SV Index:   23 LVOT Area:     2.84 cm  RIGHT VENTRICLE RV S prime:     12.20 cm/s TAPSE (M-mode): 2.4 cm LEFT ATRIUM              Index       RIGHT ATRIUM           Index LA diam:        4.50 cm  2.05 cm/m  RA Area:     16.90 cm LA Vol (A2C):   59.4 ml  27.10 ml/m RA Volume:   45.40 ml  20.71 ml/m LA Vol (A4C):   101.0 ml 46.07 ml/m LA Biplane Vol: 82.7 ml  37.72 ml/m  AORTIC VALVE AV Area (Vmax):    2.11 cm AV Area (Vmean):   2.37 cm AV Area (VTI):     2.15 cm AV Vmax:           132.91 cm/s AV Vmean:          87.659 cm/s AV VTI:            0.231 m AV Peak Grad:      7.1 mmHg AV Mean Grad:      3.5 mmHg LVOT Vmax:         98.98 cm/s LVOT Vmean:        73.236 cm/s LVOT VTI:          0.175 m LVOT/AV VTI ratio: 0.76  AORTA Ao Root diam: 3.20 cm MITRAL VALVE MV Area (PHT): 3.83 cm    SHUNTS MV Decel Time: 198 msec    Systemic VTI:  0.18 m MV E velocity: 73.70 cm/s  Systemic Diam: 1.90 cm MV A velocity: 65.10 cm/s MV E/A ratio:  1.13  Carlyle Dolly MD Electronically signed by Carlyle Dolly MD Signature Date/Time: 03/25/2020/11:56:00 AM    Final        Subjective: Overall dyspnea on exertion has improved  Discharge Exam: Vitals:   03/26/20 0907 03/26/20 1324 03/26/20 1437 03/26/20 1445  BP: (!) 117/91 (!) 1'50/89 97/60 94/79 '  Pulse: 75 85 91 90  Resp: '18 18 16 16  ' Temp: 98.3 F (36.8 C) 99.2 F (37.3 C) 98.2 F (36.8 C)   TempSrc: Tympanic     SpO2: 100% 98% 99% 99%  Weight:      Height:        General: Pt is alert, awake, not in acute distress Cardiovascular: RRR, S1/S2 +, no rubs, no gallops Respiratory: CTA bilaterally, no wheezing, no rhonchi Abdominal: Soft, NT, ND, bowel sounds + Extremities: no edema, no cyanosis    The results of significant diagnostics from this hospitalization (including imaging, microbiology, ancillary and laboratory) are listed below for reference.     Microbiology: Recent Results (  from the past 240 hour(s))  Resp Panel by RT PCR (RSV, Flu A&B, Covid) - Nasopharyngeal Swab     Status: None   Collection Time: 03/23/20  6:28 PM   Specimen: Nasopharyngeal Swab  Result Value Ref Range Status   SARS Coronavirus 2 by RT PCR NEGATIVE NEGATIVE Final    Comment: (NOTE) SARS-CoV-2 target nucleic acids are NOT DETECTED.  The SARS-CoV-2 RNA is generally detectable in upper respiratoy specimens during the acute phase of infection. The lowest concentration of SARS-CoV-2 viral copies this assay can detect is 131 copies/mL. A negative result does not preclude SARS-Cov-2 infection and should not be used as the sole basis for treatment or other patient management decisions. A negative result may occur with  improper specimen collection/handling, submission of specimen other than nasopharyngeal swab, presence of viral mutation(s) within the areas targeted by this assay, and inadequate number of viral copies (<131 copies/mL). A negative result must be combined with  clinical observations, patient history, and epidemiological information. The expected result is Negative.  Fact Sheet for Patients:  PinkCheek.be  Fact Sheet for Healthcare Providers:  GravelBags.it  This test is no t yet approved or cleared by the [place] FDA and  has been authorized for detection and/or diagnosis of SARS-CoV-2 by FDA under an Emergency Use Authorization (EUA). This EUA will remain  in effect (meaning this test can be used) for the duration of the COVID-19 declaration under Section 564(b)(1) of the Act, 21 U.S.C. section 360bbb-3(b)(1), unless the authorization is terminated or revoked sooner.     Influenza A by PCR NEGATIVE NEGATIVE Final   Influenza B by PCR NEGATIVE NEGATIVE Final    Comment: (NOTE) The Xpert Xpress SARS-CoV-2/FLU/RSV assay is intended as an aid in  the diagnosis of influenza from Nasopharyngeal swab specimens and  should not be used as a sole basis for treatment. Nasal washings and  aspirates are unacceptable for Xpert Xpress SARS-CoV-2/FLU/RSV  testing.  Fact Sheet for Patients: PinkCheek.be  Fact Sheet for Healthcare Providers: GravelBags.it  This test is not yet approved or cleared by the [place] FDA and  has been authorized for detection and/or diagnosis of SARS-CoV-2 by  FDA under an Emergency Use Authorization (EUA). This EUA will remain  in effect (meaning this test can be used) for the duration of the  Covid-19 declaration under Section 564(b)(1) of the Act, 21  U.S.C. section 360bbb-3(b)(1), unless the authorization is  terminated or revoked.    Respiratory Syncytial Virus by PCR NEGATIVE NEGATIVE Final    Comment: (NOTE) Fact Sheet for Patients: PinkCheek.be  Fact Sheet for Healthcare Providers: GravelBags.it  This test is not yet approved  or cleared by the [place] FDA and  has been authorized for detection and/or diagnosis of SARS-CoV-2 by  FDA under an Emergency Use Authorization (EUA). This EUA will remain  in effect (meaning this test can be used) for the duration of the  COVID-19 declaration under Section 564(b)(1) of the Act, 21 U.S.C.  section 360bbb-3(b)(1), unless the authorization is terminated or  revoked. Performed at [facility], [address], [place], [place] [address]      Labs: BNP (last 3 results) No results for input(s): BNP in the last 8760 hours. Basic Metabolic Panel: Recent Labs  Lab 03/23/20 2016 03/24/20 0609  NA 131* 137  K 3.6 4.3  CL 99 101  CO2 21* 26  GLUCOSE 186* 111*  BUN 21* 15  CREATININE 0.84 0.70  CALCIUM 9.0 9.1  MG  --  2.0  PHOS  --  4.1   Liver Function Tests: Recent Labs  Lab 03/24/20 0609  AST 21  ALT 16  ALKPHOS 90  BILITOT 0.5  PROT 7.6  ALBUMIN 3.7   No results for input(s): LIPASE, AMYLASE in the last 168 hours. No results for input(s): AMMONIA in the last 168 hours. CBC: Recent Labs  Lab 03/23/20 2016 03/24/20 0609 03/25/20 0936 03/26/20 0401  WBC 7.2 5.2 5.1 5.1  NEUTROABS 3.8  --   --   --   HGB 6.6* 8.4* 8.9* 8.0*  HCT 23.0* 28.9* 30.2* 27.8*  MCV 72.8* 75.7* 75.7* 76.2*  PLT 287 254 256 233   Cardiac Enzymes: No results for input(s): CKTOTAL, CKMB, CKMBINDEX, TROPONINI in the last 168 hours. BNP: Invalid input(s): POCBNP CBG: Recent Labs  Lab 03/25/20 2105 03/26/20 0747 03/26/20 1141 03/26/20 1254 03/26/20 1633  GLUCAP 91 103* 65* 126* 141*   D-Dimer No results for input(s): DDIMER in the last 72 hours. Hgb A1c No results for input(s): HGBA1C in the last 72 hours. Lipid Profile No results for input(s): CHOL, HDL, LDLCALC, TRIG, CHOLHDL, LDLDIRECT in the last 72 hours. Thyroid function studies No results for input(s): TSH, T4TOTAL, T3FREE, THYROIDAB in the last 72 hours.  Invalid input(s): FREET3 Anemia work up No  results for input(s): VITAMINB12, FOLATE, FERRITIN, TIBC, IRON, RETICCTPCT in the last 72 hours. Urinalysis    Component Value Date/Time   COLORURINE YELLOW 01/26/2013 0651   APPEARANCEUR CLEAR 01/26/2013 0651   LABSPEC 1.017 01/26/2013 0651   PHURINE 7.0 01/26/2013 0651   GLUCOSEU NEGATIVE 01/26/2013 0651   HGBUR NEGATIVE 01/26/2013 0651   BILIRUBINUR NEGATIVE 01/26/2013 0651   KETONESUR NEGATIVE 01/26/2013 0651   PROTEINUR NEGATIVE 01/26/2013 0651   UROBILINOGEN 0.2 01/26/2013 0651   NITRITE NEGATIVE 01/26/2013 0651   LEUKOCYTESUR NEGATIVE 01/26/2013 0651   Sepsis Labs Invalid input(s): PROCALCITONIN,  WBC,  LACTICIDVEN Microbiology Recent Results (from the past 240 hour(s))  Resp Panel by RT PCR (RSV, Flu A&B, Covid) - Nasopharyngeal Swab     Status: None   Collection Time: 03/23/20  6:28 PM   Specimen: Nasopharyngeal Swab  Result Value Ref Range Status   SARS Coronavirus 2 by RT PCR NEGATIVE NEGATIVE Final    Comment: (NOTE) SARS-CoV-2 target nucleic acids are NOT DETECTED.  The SARS-CoV-2 RNA is generally detectable in upper respiratoy specimens during the acute phase of infection. The lowest concentration of SARS-CoV-2 viral copies this assay can detect is 131 copies/mL. A negative result does not preclude SARS-Cov-2 infection and should not be used as the sole basis for treatment or other patient management decisions. A negative result may occur with  improper specimen collection/handling, submission of specimen other than nasopharyngeal swab, presence of viral mutation(s) within the areas targeted by this assay, and inadequate number of viral copies (<131 copies/mL). A negative result must be combined with clinical observations, patient history, and epidemiological information. The expected result is Negative.  Fact Sheet for Patients:  PinkCheek.be  Fact Sheet for Healthcare Providers:   GravelBags.it  This test is no t yet approved or cleared by the [place] FDA and  has been authorized for detection and/or diagnosis of SARS-CoV-2 by FDA under an Emergency Use Authorization (EUA). This EUA will remain  in effect (meaning this test can be used) for the duration of the COVID-19 declaration under Section 564(b)(1) of the Act, 21 U.S.C. section 360bbb-3(b)(1), unless the authorization is terminated or revoked sooner.     Influenza  A by PCR NEGATIVE NEGATIVE Final   Influenza B by PCR NEGATIVE NEGATIVE Final    Comment: (NOTE) The Xpert Xpress SARS-CoV-2/FLU/RSV assay is intended as an aid in  the diagnosis of influenza from Nasopharyngeal swab specimens and  should not be used as a sole basis for treatment. Nasal washings and  aspirates are unacceptable for Xpert Xpress SARS-CoV-2/FLU/RSV  testing.  Fact Sheet for Patients: PinkCheek.be  Fact Sheet for Healthcare Providers: GravelBags.it  This test is not yet approved or cleared by the [place] FDA and  has been authorized for detection and/or diagnosis of SARS-CoV-2 by  FDA under an Emergency Use Authorization (EUA). This EUA will remain  in effect (meaning this test can be used) for the duration of the  Covid-19 declaration under Section 564(b)(1) of the Act, 21  U.S.C. section 360bbb-3(b)(1), unless the authorization is  terminated or revoked.    Respiratory Syncytial Virus by PCR NEGATIVE NEGATIVE Final    Comment: (NOTE) Fact Sheet for Patients: PinkCheek.be  Fact Sheet for Healthcare Providers: GravelBags.it  This test is not yet approved or cleared by the [place] FDA and  has been authorized for detection and/or diagnosis of SARS-CoV-2 by  FDA under an Emergency Use Authorization (EUA). This EUA will remain  in effect (meaning this test can be  used) for the duration of the  COVID-19 declaration under Section 564(b)(1) of the Act, 21 U.S.C.  section 360bbb-3(b)(1), unless the authorization is terminated or  revoked. Performed at [facility], [address] [place], [place] [address]      Time coordinating discharge: 37mns  SIGNED:   JKathie Dike MD  [facility] 03/26/2020, 9:24 PM   If 7PM-7AM, please contact night-coverage [email or web address]

## 2020-03-26 NOTE — Progress Notes (Signed)
Pt discharged home. Wife bedside and was informed of discharge instructions as well as pt, neither had any further questions nor concerns.

## 2020-03-26 NOTE — Op Note (Addendum)
[facility] Patient Name: Stephen Roberts Procedure Date: 03/26/2020 1:46 PM MRN: [id number] Date of Birth: [birth year] Attending MD: Norvel Richards , MD CSN: [id number] Age: 54 Admit Type: Inpatient Procedure:                Ileo-colonoscopy with snare polypectomy Indications:              Iron deficiency anemia Providers:                Norvel Richards, MD, Lambert Mody,                            Raphael Gibney, Technician Referring MD:              Medicines:                Propofol per Anesthesia Complications:            No immediate complications. Estimated Blood Loss:     Estimated blood loss was minimal. Procedure:                Pre-Anesthesia Assessment:                           - Prior to the procedure, a History and Physical                            was performed, and patient medications and                            allergies were reviewed. The patient's tolerance of                            previous anesthesia was also reviewed. The risks                            and benefits of the procedure and the sedation                            options and risks were discussed with the patient.                            All questions were answered, and informed consent                            was obtained. Prior Anticoagulants: The patient has                            taken no previous anticoagulant or antiplatelet                            agents. ASA Grade Assessment: II - A patient with                            mild systemic disease. After reviewing the risks  and benefits, the patient was deemed in                            satisfactory condition to undergo the procedure.                           After obtaining informed consent, the colonoscope                            was passed under direct vision. Throughout the                            procedure, the patient's blood pressure, pulse, and                             oxygen saturations were monitored continuously. The                            CF-[id number] ([id number]) scope was introduced through                            the anus and advanced to the 10 cm into the ileum.                            The colonoscopy was performed without difficulty.                            The patient tolerated the procedure well. The                            quality of the bowel preparation was adequate. Scope In: 2:04:28 PM Scope Out: 2:30:07 PM Scope Withdrawal Time: 0 hours 18 minutes 58 seconds  Total Procedure Duration: 0 hours 25 minutes 39 seconds  Findings:      The perianal and digital rectal examinations were normal.      Five semi-pedunculated polyps were found in the rectum, mid rectum,       descending colon and hepatic flexure. The polyps were 4 to 7 mm in size.       These polyps were removed with a cold snare. Resection and retrieval       were complete. Estimated blood loss was minimal. Distal 10 cm of       terminal ileum appeared normal. No blood or clot in the lower GI tract.      The exam was otherwise without abnormality on direct and retroflexion       views. Impression:               - Five 4 to 7 mm polyps in the rectum, in the mid                            rectum, in the descending colon and at the hepatic                            flexure, removed with a cold snare. Resected and  retrieved.                           - The examination was otherwise normal on direct                            and retroflexion views. Moderate Sedation:      Moderate (conscious) sedation was personally administered by an       anesthesia professional. The following parameters were monitored: oxygen       saturation, heart rate, blood pressure, respiratory rate, EKG, adequacy       of pulmonary ventilation, and response to care. Recommendation:           - Patient has a contact number available for                             emergencies. The signs and symptoms of potential                            delayed complications were discussed with the                            patient. Return to normal activities tomorrow.                            Written discharge instructions were provided to the                            patient.                           - Resume previous diet.                           - Continue present medications.                           - Repeat colonoscopy date to be determined after                            pending pathology results are reviewed for                            surveillance.                           - Patient has a contact number available for                            emergencies. The signs and symptoms of potential                            delayed complications were discussed with the                            patient. Return to normal activities tomorrow.  Written discharge instructions were provided to the                            patient.                           - Return patient to hospital ward for observation.                           - Advance diet as tolerated. We will arrange an                            outpatient video capsule of the small bowel in the                            near future. At patient request, I called Stephen Roberts at [phone] -reviewed results and                            recommendations.                           - Procedure Code(s):        --- Professional ---                           [phone], Colonoscopy, flexible; with removal of                            tumor(s), polyp(s), or other lesion(s) by snare                            technique Diagnosis Code(s):        --- Professional ---                           K62.1, Rectal polyp                           K63.5, Polyp of colon                           D50.9, Iron deficiency anemia, unspecified CPT  copyright 2019 American Medical Association. All rights reserved. The codes documented in this report are preliminary and upon coder review may  be revised to meet current compliance requirements. Stephen Roberts. Stephen Marcotte, MD Norvel Richards, MD 03/26/2020 2:46:10 PM This report has been signed electronically. Number of Addenda: 0

## 2020-03-26 NOTE — Anesthesia Postprocedure Evaluation (Signed)
Anesthesia Post Note  Patient: Stephen Roberts  Procedure(s) Performed: COLONOSCOPY WITH PROPOFOL (N/A ) POLYPECTOMY  Patient location during evaluation: PACU Anesthesia Type: General Level of consciousness: awake, oriented, awake and alert and patient cooperative Pain management: pain level controlled Vital Signs Assessment: post-procedure vital signs reviewed and stable Respiratory status: spontaneous breathing and respiratory function stable Cardiovascular status: blood pressure returned to baseline and stable Postop Assessment: no headache and no backache Anesthetic complications: no   No complications documented.   Last Vitals:  Vitals:   03/26/20 0907 03/26/20 1324  BP: (!) 117/91 (!) 150/89  Pulse: 75 85  Resp: 18 18  Temp: 36.8 C 37.3 C  SpO2: 100% 98%    Last Pain:  Vitals:   03/26/20 1359  TempSrc:   PainSc: 4                  Tacy Learn

## 2020-03-26 NOTE — Interval H&P Note (Signed)
History and Physical Interval Note:  03/26/2020 1:49 PM  Stephen Roberts  has presented today for surgery, with the diagnosis of iron deficiency anemia due to GI bleed.  The various methods of treatment have been discussed with the patient and family. After consideration of risks, benefits and other options for treatment, the patient has consented to  Procedure(s): COLONOSCOPY WITH PROPOFOL (N/A) as a surgical intervention.  The patient's history has been reviewed, patient examined, no change in status, stable for surgery.  I have reviewed the patient's chart and labs.  Questions were answered to the patient's satisfaction.     Stephen Roberts  No change. Seen in short stay. Hemoglobin 8.0. Hemoccult negative on admission. Essentially negative EGD yesterday as far as etiology of IDA. Concerned  Diagnostic colonoscopy today per plan.  The risks, benefits, limitations, alternatives and imponderables have been reviewed with the patient. Questions have been answered. All parties are agreeable.

## 2020-03-26 NOTE — Transfer of Care (Signed)
Immediate Anesthesia Transfer of Care Note  Patient: Stephen Roberts  Procedure(s) Performed: COLONOSCOPY WITH PROPOFOL (N/A ) POLYPECTOMY  Patient Location: PACU  Anesthesia Type:General  Level of Consciousness: awake, alert , oriented and patient cooperative  Airway & Oxygen Therapy: Patient Spontanous Breathing  Post-op Assessment: Report given to RN, Post -op Vital signs reviewed and stable and Patient moving all extremities  Post vital signs: Reviewed and stable  Last Vitals:  Vitals Value Taken Time  BP    Temp    Pulse 95 03/26/20 1434  Resp    SpO2 98 % 03/26/20 1434  Vitals shown include unvalidated device data.  Last Pain:  Vitals:   03/26/20 1359  TempSrc:   PainSc: 4       Patients Stated Pain Goal: 8 (45/91/36 8599)  Complications: No complications documented.

## 2020-03-26 NOTE — Progress Notes (Signed)
Hypoglycemic Event  CBG: 64  Treatment: 4 oz juice/soda  Symptoms: None  Follow-up CBG: Time:2105 CBG Result:91  Possible Reasons for Event: Inadequate meal intake  Comments/MD notified:    Normajean Glasgow

## 2020-03-27 ENCOUNTER — Encounter (HOSPITAL_COMMUNITY): Payer: Self-pay | Admitting: Internal Medicine

## 2020-03-30 LAB — SURGICAL PATHOLOGY

## 2020-03-31 ENCOUNTER — Encounter: Payer: Self-pay | Admitting: Internal Medicine

## 2020-04-01 ENCOUNTER — Encounter: Payer: Self-pay | Admitting: Family Medicine

## 2020-04-01 ENCOUNTER — Telehealth: Payer: Self-pay | Admitting: Gastroenterology

## 2020-04-01 ENCOUNTER — Other Ambulatory Visit: Payer: Self-pay

## 2020-04-01 ENCOUNTER — Ambulatory Visit (INDEPENDENT_AMBULATORY_CARE_PROVIDER_SITE_OTHER): Payer: BC Managed Care – PPO | Admitting: Family Medicine

## 2020-04-01 VITALS — BP 140/92 | HR 94 | Temp 98.6°F | Resp 16 | Ht 75.0 in | Wt 232.0 lb

## 2020-04-01 DIAGNOSIS — G894 Chronic pain syndrome: Secondary | ICD-10-CM | POA: Diagnosis not present

## 2020-04-01 DIAGNOSIS — E785 Hyperlipidemia, unspecified: Secondary | ICD-10-CM

## 2020-04-01 DIAGNOSIS — E119 Type 2 diabetes mellitus without complications: Secondary | ICD-10-CM | POA: Diagnosis not present

## 2020-04-01 DIAGNOSIS — G8929 Other chronic pain: Secondary | ICD-10-CM

## 2020-04-01 DIAGNOSIS — M549 Dorsalgia, unspecified: Secondary | ICD-10-CM

## 2020-04-01 DIAGNOSIS — F17200 Nicotine dependence, unspecified, uncomplicated: Secondary | ICD-10-CM

## 2020-04-01 DIAGNOSIS — D649 Anemia, unspecified: Secondary | ICD-10-CM

## 2020-04-01 DIAGNOSIS — Z23 Encounter for immunization: Secondary | ICD-10-CM

## 2020-04-01 DIAGNOSIS — M5011 Cervical disc disorder with radiculopathy,  high cervical region: Secondary | ICD-10-CM

## 2020-04-01 LAB — BASIC METABOLIC PANEL
BUN/Creatinine Ratio: 18 (calc) (ref 6–22)
BUN: 12 mg/dL (ref 7–25)
CO2: 26 mmol/L (ref 20–32)
Calcium: 9 mg/dL (ref 8.6–10.3)
Chloride: 101 mmol/L (ref 98–110)
Creat: 0.66 mg/dL — ABNORMAL LOW (ref 0.70–1.33)
Glucose, Bld: 100 mg/dL — ABNORMAL HIGH (ref 65–99)
Potassium: 4.1 mmol/L (ref 3.5–5.3)
Sodium: 136 mmol/L (ref 135–146)

## 2020-04-01 LAB — CBC WITH DIFFERENTIAL/PLATELET
Absolute Monocytes: 700 cells/uL (ref 200–950)
Basophils Absolute: 42 cells/uL (ref 0–200)
Basophils Relative: 0.6 %
Eosinophils Absolute: 217 cells/uL (ref 15–500)
Eosinophils Relative: 3.1 %
HCT: 29.8 % — ABNORMAL LOW (ref 38.5–50.0)
Hemoglobin: 8.9 g/dL — ABNORMAL LOW (ref 13.2–17.1)
Lymphs Abs: 1995 cells/uL (ref 850–3900)
MCH: 22.2 pg — ABNORMAL LOW (ref 27.0–33.0)
MCHC: 29.9 g/dL — ABNORMAL LOW (ref 32.0–36.0)
MCV: 74.3 fL — ABNORMAL LOW (ref 80.0–100.0)
Monocytes Relative: 10 %
Neutro Abs: 4046 cells/uL (ref 1500–7800)
Neutrophils Relative %: 57.8 %
Platelets: 216 10*3/uL (ref 140–400)
RBC: 4.01 10*6/uL — ABNORMAL LOW (ref 4.20–5.80)
RDW: 22.1 % — ABNORMAL HIGH (ref 11.0–15.0)
Total Lymphocyte: 28.5 %
WBC: 7 10*3/uL (ref 3.8–10.8)

## 2020-04-01 MED ORDER — NICOTINE 21 MG/24HR TD PT24: 21.0000 mg | MEDICATED_PATCH | Freq: Every day | TRANSDERMAL | 0 refills | Status: DC

## 2020-04-01 MED ORDER — OXYCODONE-ACETAMINOPHEN 5-325 MG PO TABS: 1.0000 | ORAL_TABLET | Freq: Three times a day (TID) | ORAL | 0 refills | Status: DC | PRN

## 2020-04-01 MED ORDER — ALBUTEROL SULFATE HFA 108 (90 BASE) MCG/ACT IN AERS: 2.0000 | INHALATION_SPRAY | Freq: Four times a day (QID) | RESPIRATORY_TRACT | 1 refills | Status: DC | PRN

## 2020-04-01 NOTE — Progress Notes (Signed)
Subjective:    Patient ID: Stephen Roberts, male    DOB: [birth year], 55 y.o.   MRN: [id number]  Patient presents for Hospital F/U (anemia- was given 2 units of PRC, weakness)  Patient here for hospital follow-up.  My last visit with him was back in September 2020.  He has underlying type 2 diabetes along with hyperlipidemia.  He has chronic pain syndrome secondary to multiple surgeries. His labs in.  Shows mild anemia at that time 12.1 ,I recommended he have stool cards done as he had not had colonoscopy.  But he did not follow-up. He presented to the hospital recently after having 2 to 3 weeks of generalized weakness and dyspnea on exertion.  He was found to have a hemoglobin of 6.6 again hematocrit of 23.  We will go blood test was negative.  He was transfused 2 units and is already scheduled for outpatient capsule endoscopy by GI. He did have EGD as well as colonoscopy but no source of bleeding was found in the hospital.  However he did have 5 polyps removed from the ascending colon to the rectum he was started on iron tablets 325 mg twice a day.   Diabetes mellitus A1c was performed the hospital was normal at 5.6%.  He is still on Metformin 500 mg once a day he states for months he has had hypoglycemia episodes he checked his sugar is low.  He has to eat something to get it up.  GERD he was started on PPI advised not to use any NSAIDs in the setting of his reflux as well as his anemia.   He tells me today to be honest that his symptoms really started about 2 months ago.  He had a significant amount of alcohol abuse with his friends and had 2 days of vomiting and bowel movements.  He states that it was black coming from both ends.  Since then he has had these episodes of severe fatigue his legs feel like needles.  He also has to wear sensation in his right lower abdomen which he still gets on and off due to his chronic pain associated with his multiple neck surgeries back surgery he has been  taking NSAIDs and Tylenol as he was out of his pain medication.  His neurosurgeon Dr. Trenton Gammon is actually recommended that he have a cage put in his neck when he is not on proceed to surgery.  He also discussed with him considering pain management    Review Of Systems:  GEN- +fatigue, fever, weight loss,weakness, recent illness HEENT- denies eye drainage, change in vision, nasal discharge, CVS- denies chest pain, palpitations RESP- denies SOB, cough, wheeze ABD- denies N/V, change in stools, abd pain GU- denies dysuria, hematuria, dribbling, incontinence MSK- +joint pain, muscle aches, injury Neuro- denies headache, dizziness, syncope, seizure activity       Objective:    BP (!) 140/92   Pulse 94   Temp 98.6 F (37 C) (Temporal)   Resp 16   Ht 6\' 3"  (1.905 m)   Wt 232 lb (105.2 kg)   SpO2 98%   BMI 29.00 kg/m  GEN- NAD, alert and oriented x3 HEENT- PERRL, EOMI, non injected sclera, pink conjunctiva, MMM, oropharynx clear Neck- Supple, no thyromegaly decreased range of motion CVS- RRR, no murmur RESP-CTAB ABD-NABS,soft,NT,ND EXT- No edema Pulses- Radial, DP- 2+        Assessment & Plan:      Problem List Items Addressed This Visit  Unprioritized   Cervical disc disorder with radiculopathy, high cervical region   Relevant Medications   nicotine (NICODERM CQ) 21 mg/24hr patch   Chronic pain syndrome    Multiple surgeries chronic pain.  At this time because of his ongoing anemia work-up I do not recommend him having surgical intervention.  I have given him a prescription for oxycodone acetaminophen He will be referred to pain management      Relevant Medications   oxyCODONE-acetaminophen (PERCOCET) 5-325 MG tablet   Hyperlipidemia   Symptomatic anemia    He is proceeding with capsule endoscopy concerned that he may have ulceration in the small bowel or some other type of varices that is contributing toa slow bleed.  We will continue iron supplementation, labs  performed today      Relevant Orders   CBC with Differential/Platelet   Basic metabolic panel   Tobacco use disorder    Continue NicoDerm patch he currently has 21 mg patch on      Type 2 diabetes mellitus ([place]) - Primary    A1c is 5.6% and he is having hypoglycemia episodes.  We will discontinue Metformin      Relevant Orders   CBC with Differential/Platelet   Basic metabolic panel    Other Visit Diagnoses    Chronic bilateral back pain, unspecified back location       Relevant Medications   oxyCODONE-acetaminophen (PERCOCET) 5-325 MG tablet   Need for immunization against influenza       Relevant Orders   Flu Vaccine QUAD 36+ mos IM (Completed)      Note: This dictation was prepared with Dragon dictation along with smaller phrase technology. Any transcriptional errors that result from this process are unintentional.

## 2020-04-01 NOTE — Assessment & Plan Note (Signed)
Multiple surgeries chronic pain.  At this time because of his ongoing anemia work-up I do not recommend him having surgical intervention.  I have given him a prescription for oxycodone acetaminophen He will be referred to pain management

## 2020-04-01 NOTE — Assessment & Plan Note (Signed)
Continue NicoDerm patch he currently has 21 mg patch on

## 2020-04-01 NOTE — Telephone Encounter (Signed)
Patient needs to be scheduled for capsule endoscopy (recently seen inpatient for IDA which is unexplained on recent EGD/colonoscopy).

## 2020-04-01 NOTE — Assessment & Plan Note (Addendum)
He is proceeding with capsule endoscopy concerned that he may have ulceration in the small bowel or some other type of varices that is contributing toa slow bleed.  We will continue iron supplementation, labs performed today

## 2020-04-01 NOTE — Telephone Encounter (Signed)
Called pt. He has been scheduled for 11/29 at 7:30am, arrival 7:00am. Patient reports he has access to his [facility] Instructions have been sent to him. Message sent to Roseanne Kaufman, NP as an Juluis Rainier.

## 2020-04-01 NOTE — Assessment & Plan Note (Signed)
A1c is 5.6% and he is having hypoglycemia episodes.  We will discontinue Metformin

## 2020-04-01 NOTE — Patient Instructions (Addendum)
Stop the Metformin We will call with lab results for the anemia Pain medication refilled - oxycodone- acetaminophen Referral to pain clinic F/U 3 months

## 2020-04-13 ENCOUNTER — Encounter (HOSPITAL_COMMUNITY): Payer: Self-pay | Admitting: Internal Medicine

## 2020-04-13 ENCOUNTER — Encounter (HOSPITAL_COMMUNITY)
Admission: RE | Disposition: A | Discharge status: Home / Self Care | Payer: Self-pay | Source: Home / Self Care | Attending: Internal Medicine

## 2020-04-13 ENCOUNTER — Ambulatory Visit (HOSPITAL_COMMUNITY)
Admission: RE | Admit: 2020-04-13 | Discharge: 2020-04-13 | Disposition: A | Discharge status: Home / Self Care | Payer: BC Managed Care – PPO | Attending: Internal Medicine | Admitting: Internal Medicine

## 2020-04-13 DIAGNOSIS — D509 Iron deficiency anemia, unspecified: Secondary | ICD-10-CM | POA: Diagnosis not present

## 2020-04-13 DIAGNOSIS — Z86010 Personal history of colonic polyps: Secondary | ICD-10-CM | POA: Diagnosis not present

## 2020-04-13 DIAGNOSIS — Z791 Long term (current) use of non-steroidal anti-inflammatories (NSAID): Secondary | ICD-10-CM | POA: Diagnosis not present

## 2020-04-13 DIAGNOSIS — K449 Diaphragmatic hernia without obstruction or gangrene: Secondary | ICD-10-CM | POA: Insufficient documentation

## 2020-04-13 DIAGNOSIS — K921 Melena: Secondary | ICD-10-CM | POA: Insufficient documentation

## 2020-04-13 DIAGNOSIS — D500 Iron deficiency anemia secondary to blood loss (chronic): Secondary | ICD-10-CM | POA: Diagnosis not present

## 2020-04-13 HISTORY — PX: GIVENS CAPSULE STUDY: SHX5432

## 2020-04-13 SURGERY — IMAGING PROCEDURE, GI TRACT, INTRALUMINAL, VIA CAPSULE
Anesthesia: Monitor Anesthesia Care

## 2020-04-14 ENCOUNTER — Encounter (HOSPITAL_COMMUNITY): Payer: Self-pay | Admitting: Internal Medicine

## 2020-04-15 ENCOUNTER — Telehealth: Payer: Self-pay | Admitting: Gastroenterology

## 2020-04-15 DIAGNOSIS — D500 Iron deficiency anemia secondary to blood loss (chronic): Secondary | ICD-10-CM

## 2020-04-15 NOTE — Procedures (Signed)
Small Bowel [place] Capsule Study Procedure date:  04/13/20  Referring Provider:  Neil Crouch, PA-C PCP:  Dr. Alycia Rossetti, MD  Indication for procedure:   54 year old male inpatient Nov 2021 with melena and anemia, receiving 2 units PRBCs and undergoing EGD followed by colonoscopy. EGD with normal esophagus, 7 cm hiatal hernia, erythematous gastric mucosa at level of hiatus, normal duodenum. Colonoscopy with multiple polyps and due for surveillance in 5 years. Capsule now indicated due to profound IDA on presentation.    Findings:   Capsule study complete to the cecum. No mass, lesion, abnormal villi, or gross blood noted. Distal small bowel difficult to evaluate due to debris and not adequately seen. Small lesions could be missed due to obscure view.   First Gastric image:  00:01:50 First Duodenal image: 00:11:46 First Cecal image: 04:16:01 Gastric Passage time: 0h 45m Small Bowel Passage time:  4h 10m  Summary & Recommendations: 54 year old male presenting with profound anemia, IDA, s/p colonoscopy/EGD while inpatient. I do note he had erythematous gastric mucosa at level of hiatus, and sliding hiatal hernia was large. Colonoscopy with polyps. Capsule study unrevealing although distal small bowel was not adequately seen due to poor prep and debris. Unable to exclude occult lesions more distally. Interestingly, he does report long-standing history of NSAID use, which could have certainly contributed to occult small bowel insult not appreciated due to obscured view.   At this time, needs referral to Hematology to be more closely monitored. Low threshold for repeat capsule with better prep depending on clinical progress, any worsening of anemia, or recurrent melena. Going forward, needs to abstain from NSAIDs indefinitely.   Will arrange Hematology referral.    Annitta Needs, PhD, ANP-BC [facility]

## 2020-04-15 NOTE — Telephone Encounter (Signed)
Reviewed capsule study. Capsule study unrevealing although distal small bowel was not adequately seen due to poor prep and debris. Unable to exclude occult lesions more distally. Interestingly, he does report long-standing history of NSAID use, which could have certainly contributed to occult small bowel insult not appreciated due to obscured view.   At this time, needs referral to Hematology to be more closely monitored. Low threshold for repeat capsule with better prep depending on clinical progress, any worsening of anemia, or recurrent melena. Going forward, needs to abstain from NSAIDs indefinitely.    Please arrange Hematology referral due to [place].

## 2020-04-16 ENCOUNTER — Telehealth: Payer: Self-pay | Admitting: Internal Medicine

## 2020-04-16 NOTE — Telephone Encounter (Signed)
Spoke with pt. Pt was notified of results and recommendations. Pt will avoid NSAIDS as directed. Please arrange Hematology Referral.

## 2020-04-16 NOTE — Telephone Encounter (Signed)
Lmom, waiting on a return call.  

## 2020-04-16 NOTE — Telephone Encounter (Signed)
Returning call. [phone]

## 2020-04-16 NOTE — Telephone Encounter (Signed)
Referral sent 

## 2020-04-16 NOTE — Addendum Note (Signed)
Addended by: Cheron Every on: 04/16/2020 01:28 PM   Modules accepted: Orders

## 2020-04-16 NOTE — Telephone Encounter (Signed)
Returned call.  See other phone note for documentation.

## 2020-04-17 NOTE — Progress Notes (Signed)
[place] CONSULT NOTE  Patient Care Team: [place], Modena Nunnery, MD as PCP - General (Family Medicine)  CHIEF COMPLAINTS/PURPOSE OF CONSULTATION:  IDA  ASSESSMENT & PLAN:  No problem-specific Assessment & Plan notes found for this encounter.  No orders of the defined types were placed in this encounter.  54 year old male inpatient Nov 2021 with melena and anemia, needed 2 units of PRBC inpatient. GI consulted, EGD with normal esophagus, 7 cm hiatal hernia, erythematous gastric mucosa at level of hiatus, normal duodenum. Colonoscopy with multiple polyps and due for surveillance in 5 years. Capsule study unrevealing although distal small bowel was not adequately seen due to poor prep and debris. Unable to exclude occult lesions more distally. He is here for management of IDA. According to GI, he was using NSAID's for long time, hence they felt that an occult small bowel insult could be a possibility, From history, it does appear that his iron deficiency is likely a result of blood loss in the gastrointestinal tract from chronic NSAID use. Given severe iron deficiency anemia, I agree with intravenous iron supplementation.  We have discussed about Feraheme as a choice of IV iron supplementation, once a week for 2 doses.  We have discussed about the very rare side effect of allergic reactions with IV iron.  Most people tolerate it very well. The benefits of IV iron infusion exceeds the risks at this time and hence I believe it is appropriate to proceed with it.  He should return to clinic in about 8 weeks with repeat labs to follow-up on iron deficiency.  If he continues to have iron deficiency anemia, he may need repeat GI evaluation. I have also asked him to take B12 supplementation 1000 mcg a day. Smoking cessation encouraged.  Thank you for consulting [place] in the care of this patient.  Please not hesitate to contact us with any additional questions or concerns.  HISTORY OF  PRESENTING ILLNESS:  Stephen Roberts 55 y.o. male is here because of iron deficiency anemia.  Patient is a good historian.  He tells me that he has had back surgeries a couple times and for the past 3 years has been using ibuprofen to alleviate his pain and there are days when he used up to 20 a day to manage his back pain.  He about 3 weeks prior to admission noticed coffee-ground emesis and melena after drinking alcohol.  Since then he felt really weak and passed out 1 day in the front yard and that is when his son took him to the emergency department.  He had severe shortness of breath, fatigue and was found to have a hemoglobin of 6.6 at the time of admission. He received 2 units of packed red blood cells and also had scopes which did not show a definitive evidence of bleeding although GI evaluation noted erythematous gastric mucosa, 7 cm hiatal hernia and multiple polyps in the colon He has felt a bit better after the hospital discharge but continues to feel very weak, tired in his legs, no energy to do anything, some positional dizziness when he changes positions.  His abdominal pain or heartburn pain that he noticed in the past has improved with new proton pump inhibitor that he has been taking.  He also stopped taking ibuprofen and is now taking oxycodone for pain management, he takes at 1's or twice in a couple days. He had a hemoglobin of about 12 g a couple years ago according to his memory,  so he thinks this has been going on for a little while. He calls himself as an alcoholic, he does not drink regularly but when he drinks he cannot stop with a few drinks.  He smokes about half pack a day now but was smoking about 1 pack a day.  Rest of the pertinent review of systems as mentioned below and negative.  REVIEW OF SYSTEMS:   Constitutional: Denies fevers, chills or abnormal night sweats Eyes: Denies blurriness of vision, double vision or watery eyes Ears, nose, mouth, throat, and face: Denies  mucositis or sore throat Respiratory: Denies cough, dyspnea or wheezes Cardiovascular: As mentioned above Gastrointestinal: As mentioned above Skin: Denies abnormal skin rashes Lymphatics: Denies new lymphadenopathy or easy bruising Neurological:Denies numbness, tingling or new weaknesses Behavioral/Psych: Mood is stable, no new changes  All other systems were reviewed with the patient and are negative.  MEDICAL HISTORY:  Past Medical History:  Diagnosis Date  . Arthritis   . Diabetes ([place])   . GERD (gastroesophageal reflux disease)   . Heart murmur   . Pneumonia 06/27/2006    SURGICAL HISTORY: Past Surgical History:  Procedure Laterality Date  . ANTERIOR FUSION CERVICAL SPINE    . COLONOSCOPY WITH PROPOFOL N/A 03/26/2020   Procedure: COLONOSCOPY WITH PROPOFOL;  Surgeon: Daneil Dolin, MD;  [facility];  Service: Endoscopy;  Laterality: N/A;  . disc in neck Left 05/2017  . ESOPHAGEAL DILATION N/A 03/25/2020   Procedure: ESOPHAGEAL DILATION;  Surgeon: Rogene Houston, MD;  [facility];  Service: Endoscopy;  Laterality: N/A;  . ESOPHAGOGASTRODUODENOSCOPY (EGD) WITH PROPOFOL N/A 03/25/2020   Procedure: ESOPHAGOGASTRODUODENOSCOPY (EGD) WITH PROPOFOL;  Surgeon: Rogene Houston, MD;  [facility];  Service: Endoscopy;  Laterality: N/A;  . GIVENS CAPSULE STUDY N/A 04/13/2020   Procedure: GIVENS CAPSULE STUDY;  Surgeon: Daneil Dolin, MD;  [facility];  Service: Endoscopy;  Laterality: N/A;  7:30am  . INSERTION OF MESH N/A 01/23/2013   Procedure: INSERTION OF MESH;  Surgeon: Adin Hector, MD;  Location: [facility];  Service: General;  Laterality: N/A;  . POLYPECTOMY  03/26/2020   Procedure: POLYPECTOMY;  Surgeon: Daneil Dolin, MD;  [facility];  Service: Endoscopy;;  . TONSILLECTOMY     as child  . VENTRAL HERNIA REPAIR N/A 01/23/2013   Procedure: LAPAROSCOPIC MULTIPLE VENTRAL HERNIAS;  Surgeon: Adin Hector, MD;   Location: [facility];  Service: General;  Laterality: N/A;    SOCIAL HISTORY: Social History   Socioeconomic History  . Marital status: Married    Spouse name: Not on file  . Number of children: Not on file  . Years of education: Not on file  . Highest education level: Not on file  Occupational History  . Not on file  Tobacco Use  . Smoking status: Current Every Day Smoker    Packs/day: 0.50    Years: 30.00    Pack years: 15.00    Types: Cigarettes  . Smokeless tobacco: Never Used  Substance and Sexual Activity  . Alcohol use: Yes    Comment: moderately  . Drug use: Not Currently    Types: Cocaine    Comment: states he has tried it all. Last used cocaine a few months ago.   Marland Kitchen Sexual activity: Yes  Other Topics Concern  . Not on file  Social History Narrative  . Not on file   Social Determinants of Health   Financial Resource Strain:   .  Difficulty of Paying Living Expenses: Not on file  Food Insecurity:   . Worried About Charity fundraiser in the Last Year: Not on file  . Ran Out of Food in the Last Year: Not on file  Transportation Needs: No Transportation Needs  . Lack of Transportation (Medical): No  . Lack of Transportation (Non-Medical): No  Physical Activity: Inactive  . Days of Exercise per Week: 0 days  . Minutes of Exercise per Session: 0 min  Stress:   . Feeling of Stress : Not on file  Social Connections:   . Frequency of Communication with Friends and Family: Not on file  . Frequency of Social Gatherings with Friends and Family: Not on file  . Attends Religious Services: Not on file  . Active Member of Clubs or Organizations: Not on file  . Attends Archivist Meetings: Not on file  . Marital Status: Not on file  Intimate Partner Violence: Not At Risk  . Fear of Current or Ex-Partner: No  . Emotionally Abused: No  . Physically Abused: No  . Sexually Abused: No    FAMILY HISTORY: Family History  Problem Relation Age of Onset  .  Depression Mother   . Hearing loss Mother   . Hyperlipidemia Mother   . Hypertension Mother   . Stroke Mother   . Early death Father   . Diabetes Brother   . Pancreatic cancer Paternal Uncle   . Colon cancer Neg Hx     ALLERGIES:  is allergic to gabapentin and lyrica [pregabalin].  MEDICATIONS:  Current Outpatient Medications  Medication Sig Dispense Refill  . albuterol (VENTOLIN HFA) 108 (90 Base) MCG/ACT inhaler Inhale 2 puffs into the lungs every 6 (six) hours as needed for wheezing or shortness of breath. 1 each 1  . docusate sodium (COLACE) 100 MG capsule Take 1 capsule (100 mg total) by mouth 2 (two) times daily. 60 capsule 2  . ferrous sulfate 325 (65 FE) MG EC tablet Take 1 tablet (325 mg total) by mouth 2 (two) times daily. 60 tablet 3  . Multiple Vitamin (MULTIVITAMIN) tablet Take 1 tablet by mouth daily.    . nicotine (NICODERM CQ) 21 mg/24hr patch Place 1 patch (21 mg total) onto the skin daily. 28 patch 0  . Omega-3 Fatty Acids (OMEGA-3 PO) Take by mouth.    . oxyCODONE-acetaminophen (PERCOCET) 5-325 MG tablet Take 1 tablet by mouth every 8 (eight) hours as needed for severe pain. 45 tablet 0  . pantoprazole (PROTONIX) 40 MG tablet Take 1 tablet (40 mg total) by mouth daily. 30 tablet 1  . TURMERIC PO Take by mouth.     No current facility-administered medications for this visit.     PHYSICAL EXAMINATION: ECOG PERFORMANCE STATUS: 2 - Symptomatic, <50% confined to bed  Vitals:   04/20/20 1356  BP: 104/68  Pulse: 89  Resp: 18  Temp: (!) 97.1 F (36.2 C)  SpO2: 99%   Filed Weights   04/20/20 1356  Weight: 232 lb 6.4 oz (105.4 kg)    GENERAL:alert, no distress and comfortable, pale SKIN: skin color, texture, turgor are normal, no rashes or significant lesions EYES: normal, conjunctiva are pink and non-injected, sclera clear. Pallor noted. OROPHARYNX:no exudate, no erythema and lips, buccal mucosa, and tongue normal  NECK: supple, thyroid normal size,  non-tender, without nodularity LYMPH:  no palpable lymphadenopathy in the cervical, axillary or inguinal LUNGS: clear to auscultation and percussion with normal breathing effort HEART: regular rate & rhythm  and no murmurs and no lower extremity edema ABDOMEN:abdomen soft, non-tender and normal bowel sounds Musculoskeletal:no cyanosis of digits and no clubbing  PSYCH: alert & oriented x 3 with fluent speech NEURO: no focal motor/sensory deficits  LABORATORY DATA:  I have reviewed the data as listed Lab Results  Component Value Date   WBC 7.0 04/01/2020   HGB 8.9 (L) 04/01/2020   HCT 29.8 (L) 04/01/2020   MCV 74.3 (L) 04/01/2020   PLT 216 04/01/2020     Chemistry      Component Value Date/Time   NA 136 04/01/2020 1233   K 4.1 04/01/2020 1233   CL 101 04/01/2020 1233   CO2 26 04/01/2020 1233   BUN 12 04/01/2020 1233   CREATININE 0.66 (L) 04/01/2020 1233      Component Value Date/Time   CALCIUM 9.0 04/01/2020 1233   ALKPHOS 90 03/24/2020 0609   AST 21 03/24/2020 0609   ALT 16 03/24/2020 0609   BILITOT 0.5 03/24/2020 0609       RADIOGRAPHIC STUDIES: I have personally reviewed the radiological images as listed and agreed with the findings in the report. CT ABDOMEN PELVIS W CONTRAST  Result Date: 03/24/2020 CLINICAL DATA:  Abdominal pain.  Nausea and vomiting with melena. EXAM: CT ABDOMEN AND PELVIS WITH CONTRAST TECHNIQUE: Multidetector CT imaging of the abdomen and pelvis was performed using the standard protocol following bolus administration of intravenous contrast. CONTRAST:  181mL OMNIPAQUE IOHEXOL 300 MG/ML  SOLN COMPARISON:  07/03/2010 FINDINGS: Lower chest: Clear lung bases. Hepatobiliary: No focal liver abnormality is seen. The gallbladder is collapsed. There is no biliary dilatation. Pancreas: Unremarkable. Spleen: Unremarkable. Adrenals/Urinary Tract: Slight right adrenal gland thickening. Unremarkable left adrenal gland. New punctate stone in the lower pole of the  right kidney. No hydronephrosis or renal mass. Unremarkable bladder. Stomach/Bowel: There is a large sliding hiatal hernia which is larger than on the prior study. There is no evidence of bowel obstruction or inflammation. The appendix is unremarkable. Vascular/Lymphatic: Mild abdominal aortic atherosclerosis without aneurysm. No enlarged lymph nodes. Reproductive: Unremarkable prostate. Other: No ascites or pneumoperitoneum. Interval ventral hernia repair. Musculoskeletal: Old right-sided rib fractures. Mild lumbar disc and facet degeneration. No suspicious osseous lesion. IMPRESSION: 1. No acute abnormality identified in the abdomen or pelvis. 2. Large hiatal hernia. 3. Punctate nonobstructing right renal stone. 4. Aortic Atherosclerosis (ICD10-I70.0). Electronically Signed   By: Logan Bores M.D.   On: 03/24/2020 11:59   DG Chest Portable 1 View  Result Date: 03/23/2020 CLINICAL DATA:  Chest pain EXAM: PORTABLE CHEST 1 VIEW COMPARISON:  04/28/2017 FINDINGS: 1841 hours. The lungs are clear without focal pneumonia, edema, pneumothorax or pleural effusion. The cardiopericardial silhouette is within normal limits for size. The visualized bony structures of the thorax show no acute abnormality. IMPRESSION: No active disease. Electronically Signed   By: Misty Stanley M.D.   On: 03/23/2020 18:56   ECHOCARDIOGRAM COMPLETE  Result Date: 03/25/2020    ECHOCARDIOGRAM REPORT   Patient Name:   SAHID BORBA Date of Exam: 03/25/2020 Medical Rec #:  [id number]        Height:       69.0 in Accession #:    [id number]       Weight:       229.9 lb Date of Birth:  07/26/1965        BSA:          2.192 m Patient Age:    23 years  BP:           113/73 mmHg Patient Gender: M                HR:           80 bpm. Exam Location:  [facility] Procedure: 2D Echo Indications:    Dyspnea 786.09 / R06.00  History:        Patient has prior history of Echocardiogram examinations, most                 recent 04/28/2017.  Signs/Symptoms:Chest Pain; Risk                 Factors:Current Smoker, Dyslipidemia and Diabetes.  Sonographer:    Leavy Cella RDCS (AE) Referring Phys: [place]  1. Left ventricular ejection fraction, by estimation, is 55 to 60%. The left ventricle has normal function. The left ventricle has no regional wall motion abnormalities. Left ventricular diastolic parameters are indeterminate.  2. Right ventricular systolic function is normal. The right ventricular size is normal.  3. Left atrial size was moderately dilated.  4. The mitral valve is normal in structure. No evidence of mitral valve regurgitation. No evidence of mitral stenosis.  5. The aortic valve is tricuspid. Aortic valve regurgitation is not visualized. No aortic stenosis is present.  6. The inferior vena cava is normal in size with greater than 50% respiratory variability, suggesting right atrial pressure of 3 mmHg. FINDINGS  Left Ventricle: Left ventricular ejection fraction, by estimation, is 55 to 60%. The left ventricle has normal function. The left ventricle has no regional wall motion abnormalities. The left ventricular internal cavity size was normal in size. There is  no left ventricular hypertrophy. Left ventricular diastolic parameters are indeterminate. Right Ventricle: The right ventricular size is normal. No increase in right ventricular wall thickness. Right ventricular systolic function is normal. Left Atrium: Left atrial size was moderately dilated. Right Atrium: Right atrial size was normal in size. Pericardium: There is no evidence of pericardial effusion. Mitral Valve: The mitral valve is normal in structure. No evidence of mitral valve regurgitation. No evidence of mitral valve stenosis. Tricuspid Valve: The tricuspid valve is normal in structure. Tricuspid valve regurgitation is not demonstrated. No evidence of tricuspid stenosis. Aortic Valve: The aortic valve is tricuspid. Aortic valve regurgitation is  not visualized. No aortic stenosis is present. Aortic valve mean gradient measures 3.5 mmHg. Aortic valve peak gradient measures 7.1 mmHg. Aortic valve area, by VTI measures 2.15 cm. Pulmonic Valve: The pulmonic valve was not well visualized. Pulmonic valve regurgitation is not visualized. No evidence of pulmonic stenosis. Aorta: The aortic root is normal in size and structure. Venous: The inferior vena cava is normal in size with greater than 50% respiratory variability, suggesting right atrial pressure of 3 mmHg. IAS/Shunts: No atrial level shunt detected by color flow Doppler.  LEFT VENTRICLE PLAX 2D LVIDd:         5.44 cm  Diastology LVIDs:         3.64 cm  LV e' medial:    5.22 cm/s LV PW:         1.10 cm  LV E/e' medial:  14.1 LV IVS:        1.09 cm  LV e' lateral:   7.18 cm/s LVOT diam:     1.90 cm  LV E/e' lateral: 10.3 LV SV:         50 LV SV Index:   23 LVOT Area:  2.84 cm  RIGHT VENTRICLE RV S prime:     12.20 cm/s TAPSE (M-mode): 2.4 cm LEFT ATRIUM              Index       RIGHT ATRIUM           Index LA diam:        4.50 cm  2.05 cm/m  RA Area:     16.90 cm LA Vol (A2C):   59.4 ml  27.10 ml/m RA Volume:   45.40 ml  20.71 ml/m LA Vol (A4C):   101.0 ml 46.07 ml/m LA Biplane Vol: 82.7 ml  37.72 ml/m  AORTIC VALVE AV Area (Vmax):    2.11 cm AV Area (Vmean):   2.37 cm AV Area (VTI):     2.15 cm AV Vmax:           132.91 cm/s AV Vmean:          87.659 cm/s AV VTI:            0.231 m AV Peak Grad:      7.1 mmHg AV Mean Grad:      3.5 mmHg LVOT Vmax:         98.98 cm/s LVOT Vmean:        73.236 cm/s LVOT VTI:          0.175 m LVOT/AV VTI ratio: 0.76  AORTA Ao Root diam: 3.20 cm MITRAL VALVE MV Area (PHT): 3.83 cm    SHUNTS MV Decel Time: 198 msec    Systemic VTI:  0.18 m MV E velocity: 73.70 cm/s  Systemic Diam: 1.90 cm MV A velocity: 65.10 cm/s MV E/A ratio:  1.13 Carlyle Dolly MD Electronically signed by Carlyle Dolly MD Signature Date/Time: 03/25/2020/11:56:00 AM    Final     Endoscopy  03/25/2020  Hypopharynx was normal. Examined esophagus was normal Z line was regular and was found 35 cms from the incisors. 7 cm hiatal hernia was present Localized mildly erythematous mucosa without bleeding at the level of hiatus.Stomach normal. Normal duodenal bulb and second portion of the duodenum  Colonoscopy with multiple polyps and due for surveillance in 5 years.   Capsule study unrevealing although distal small bowel was not adequately seen due to poor prep and debris. Unable to exclude occult lesions more distally.  Iron, TIBC and ferritin very low consistent with IDA Vit B12 382 Folate 20 Retic count 1.6%  All questions were answered. The patient knows to call the clinic with any problems, questions or concerns. I spent a total of 45 minutes in the care of this patient including H and P, review of records, counseling and coordination of care.   Benay Pike, MD 04/20/2020 2:12 PM

## 2020-04-20 ENCOUNTER — Other Ambulatory Visit (HOSPITAL_COMMUNITY): Payer: Self-pay | Admitting: *Deleted

## 2020-04-20 ENCOUNTER — Inpatient Hospital Stay (HOSPITAL_COMMUNITY): Payer: BC Managed Care – PPO | Attending: Hematology and Oncology | Admitting: Hematology and Oncology

## 2020-04-20 ENCOUNTER — Encounter (HOSPITAL_COMMUNITY): Payer: Self-pay | Admitting: Hematology and Oncology

## 2020-04-20 VITALS — BP 104/68 | HR 89 | Temp 97.1°F | Resp 18 | Ht 69.0 in | Wt 232.4 lb

## 2020-04-20 DIAGNOSIS — Z822 Family history of deafness and hearing loss: Secondary | ICD-10-CM | POA: Insufficient documentation

## 2020-04-20 DIAGNOSIS — E785 Hyperlipidemia, unspecified: Secondary | ICD-10-CM | POA: Insufficient documentation

## 2020-04-20 DIAGNOSIS — D500 Iron deficiency anemia secondary to blood loss (chronic): Secondary | ICD-10-CM | POA: Insufficient documentation

## 2020-04-20 DIAGNOSIS — Z800 Family history of malignant neoplasm of digestive organs: Secondary | ICD-10-CM | POA: Diagnosis not present

## 2020-04-20 DIAGNOSIS — I517 Cardiomegaly: Secondary | ICD-10-CM | POA: Diagnosis not present

## 2020-04-20 DIAGNOSIS — M1990 Unspecified osteoarthritis, unspecified site: Secondary | ICD-10-CM | POA: Diagnosis not present

## 2020-04-20 DIAGNOSIS — E119 Type 2 diabetes mellitus without complications: Secondary | ICD-10-CM | POA: Insufficient documentation

## 2020-04-20 DIAGNOSIS — K449 Diaphragmatic hernia without obstruction or gangrene: Secondary | ICD-10-CM | POA: Diagnosis not present

## 2020-04-20 DIAGNOSIS — R0602 Shortness of breath: Secondary | ICD-10-CM | POA: Insufficient documentation

## 2020-04-20 DIAGNOSIS — R55 Syncope and collapse: Secondary | ICD-10-CM | POA: Diagnosis not present

## 2020-04-20 DIAGNOSIS — F17210 Nicotine dependence, cigarettes, uncomplicated: Secondary | ICD-10-CM | POA: Insufficient documentation

## 2020-04-20 DIAGNOSIS — R5383 Other fatigue: Secondary | ICD-10-CM | POA: Insufficient documentation

## 2020-04-20 DIAGNOSIS — Z833 Family history of diabetes mellitus: Secondary | ICD-10-CM | POA: Insufficient documentation

## 2020-04-20 DIAGNOSIS — Z8249 Family history of ischemic heart disease and other diseases of the circulatory system: Secondary | ICD-10-CM | POA: Insufficient documentation

## 2020-04-20 DIAGNOSIS — Z8349 Family history of other endocrine, nutritional and metabolic diseases: Secondary | ICD-10-CM | POA: Insufficient documentation

## 2020-04-20 DIAGNOSIS — Z823 Family history of stroke: Secondary | ICD-10-CM | POA: Diagnosis not present

## 2020-04-20 DIAGNOSIS — Z79899 Other long term (current) drug therapy: Secondary | ICD-10-CM | POA: Diagnosis not present

## 2020-04-20 DIAGNOSIS — R531 Weakness: Secondary | ICD-10-CM | POA: Insufficient documentation

## 2020-04-20 DIAGNOSIS — N200 Calculus of kidney: Secondary | ICD-10-CM | POA: Insufficient documentation

## 2020-04-20 DIAGNOSIS — Z888 Allergy status to other drugs, medicaments and biological substances status: Secondary | ICD-10-CM | POA: Insufficient documentation

## 2020-04-20 DIAGNOSIS — I700 Atherosclerosis of aorta: Secondary | ICD-10-CM | POA: Diagnosis not present

## 2020-04-20 DIAGNOSIS — Z818 Family history of other mental and behavioral disorders: Secondary | ICD-10-CM | POA: Insufficient documentation

## 2020-04-20 DIAGNOSIS — K921 Melena: Secondary | ICD-10-CM | POA: Diagnosis not present

## 2020-04-20 NOTE — Patient Instructions (Signed)
[place] at [facility] Discharge Instructions  You were seen and examined today by Dr. Chryl Heck. Dr. Chryl Heck is a hematologist, meaning she specializes in blood disorders. Dr. Chryl Heck discussed your past medical history, family history of cancer and the events that led to you being here today.  You have been diagnosed with iron deficiency anemia, this is because you do not have the iron present in your body to jumpstart the production of your red blood cells. Dr. Chryl Heck does not believe this is related to any type of cancer, but you do need some iron infusions. We can do that here in the cancer center.  Continue taking the Protonix as prescribed. Please take a Vitamin B12 1069mcg daily, this is available at any over the counter pharmacy.   Thank you for choosing [place] at [facility] to provide your oncology and hematology care.  To afford each patient quality time with our provider, please arrive at least 15 minutes before your scheduled appointment time.   If you have a lab appointment with the [facility] please come in thru the Main Entrance and check in at the main information desk.  You need to re-schedule your appointment should you arrive 10 or more minutes late.  We strive to give you quality time with our providers, and arriving late affects you and other patients whose appointments are after yours.  Also, if you no show three or more times for appointments you may be dismissed from the clinic at the providers discretion.     Again, thank you for choosing [facility].  Our hope is that these requests will decrease the amount of time that you wait before being seen by our physicians.       _____________________________________________________________  Should you have questions after your visit to [facility], please contact our office at [phone] and follow the prompts.  Our office hours are 8:00 a.m. and 4:30  p.m. Monday - Friday.  Please note that voicemails left after 4:00 p.m. may not be returned until the following business day.  We are closed weekends and major holidays.  You do have access to a nurse 24-7, just call the main number to the clinic [phone] and do not press any options, hold on the line and a nurse will answer the phone.    For prescription refill requests, have your pharmacy contact our office and allow 72 hours.    Due to Covid, you will need to wear a mask upon entering the hospital. If you do not have a mask, a mask will be given to you at the Main Entrance upon arrival. For doctor visits, patients may have 1 support person age 38 or older with them. For treatment visits, patients can not have anyone with them due to social distancing guidelines and our immunocompromised population.

## 2020-04-27 ENCOUNTER — Other Ambulatory Visit (HOSPITAL_COMMUNITY): Payer: Self-pay | Admitting: Hematology and Oncology

## 2020-04-28 ENCOUNTER — Inpatient Hospital Stay (HOSPITAL_COMMUNITY): Payer: BC Managed Care – PPO

## 2020-04-28 ENCOUNTER — Other Ambulatory Visit: Payer: Self-pay

## 2020-04-28 VITALS — BP 118/86 | HR 93 | Temp 98.3°F | Resp 18

## 2020-04-28 DIAGNOSIS — K449 Diaphragmatic hernia without obstruction or gangrene: Secondary | ICD-10-CM | POA: Diagnosis not present

## 2020-04-28 DIAGNOSIS — R55 Syncope and collapse: Secondary | ICD-10-CM | POA: Diagnosis not present

## 2020-04-28 DIAGNOSIS — N200 Calculus of kidney: Secondary | ICD-10-CM | POA: Diagnosis not present

## 2020-04-28 DIAGNOSIS — F17210 Nicotine dependence, cigarettes, uncomplicated: Secondary | ICD-10-CM | POA: Diagnosis not present

## 2020-04-28 DIAGNOSIS — E785 Hyperlipidemia, unspecified: Secondary | ICD-10-CM | POA: Diagnosis not present

## 2020-04-28 DIAGNOSIS — Z79899 Other long term (current) drug therapy: Secondary | ICD-10-CM | POA: Diagnosis not present

## 2020-04-28 DIAGNOSIS — E119 Type 2 diabetes mellitus without complications: Secondary | ICD-10-CM | POA: Diagnosis not present

## 2020-04-28 DIAGNOSIS — K921 Melena: Secondary | ICD-10-CM | POA: Diagnosis not present

## 2020-04-28 DIAGNOSIS — R0602 Shortness of breath: Secondary | ICD-10-CM | POA: Diagnosis not present

## 2020-04-28 DIAGNOSIS — R5383 Other fatigue: Secondary | ICD-10-CM | POA: Diagnosis not present

## 2020-04-28 DIAGNOSIS — Z888 Allergy status to other drugs, medicaments and biological substances status: Secondary | ICD-10-CM | POA: Diagnosis not present

## 2020-04-28 DIAGNOSIS — D500 Iron deficiency anemia secondary to blood loss (chronic): Secondary | ICD-10-CM | POA: Diagnosis not present

## 2020-04-28 DIAGNOSIS — M1990 Unspecified osteoarthritis, unspecified site: Secondary | ICD-10-CM | POA: Diagnosis not present

## 2020-04-28 DIAGNOSIS — I700 Atherosclerosis of aorta: Secondary | ICD-10-CM | POA: Diagnosis not present

## 2020-04-28 DIAGNOSIS — I517 Cardiomegaly: Secondary | ICD-10-CM | POA: Diagnosis not present

## 2020-04-28 DIAGNOSIS — R531 Weakness: Secondary | ICD-10-CM | POA: Diagnosis not present

## 2020-04-28 MED ORDER — SODIUM CHLORIDE 0.9 % IV SOLN
200.0000 mg | Freq: Once | INTRAVENOUS | Status: AC
  Administered 2020-04-28: 15:00:00 200 mg via INTRAVENOUS
  Filled 2020-04-28: qty 200

## 2020-04-28 MED ORDER — SODIUM CHLORIDE 0.9 % IV SOLN: Freq: Once | INTRAVENOUS | Status: AC

## 2020-04-28 NOTE — Progress Notes (Signed)
Patient presents today for Venofer infusion.  Vital signs stable.  Patient complains that he gets tired easily, no other complaints since last visit.  Venofer given today per MD orders.  Tolerated infusion without adverse affects.  Vital signs stable.  No complaints at this time.  Discharge from clinic ambulatory in stable condition.  Alert and oriented X 3.  Follow up with [facility] as scheduled.

## 2020-04-28 NOTE — Patient Instructions (Signed)
[facility] Discharge Instructions for Patients Receiving Chemotherapy  Today you received the following chemotherapy agents   To help prevent nausea and vomiting after your treatment, we encourage you to take your nausea medication   If you develop nausea and vomiting that is not controlled by your nausea medication, call the clinic.   BELOW ARE SYMPTOMS THAT SHOULD BE REPORTED IMMEDIATELY:  *FEVER GREATER THAN 100.5 F  *CHILLS WITH OR WITHOUT FEVER  NAUSEA AND VOMITING THAT IS NOT CONTROLLED WITH YOUR NAUSEA MEDICATION  *UNUSUAL SHORTNESS OF BREATH  *UNUSUAL BRUISING OR BLEEDING  TENDERNESS IN MOUTH AND THROAT WITH OR WITHOUT PRESENCE OF ULCERS  *URINARY PROBLEMS  *BOWEL PROBLEMS  UNUSUAL RASH Items with * indicate a potential emergency and should be followed up as soon as possible.  Feel free to call the clinic should you have any questions or concerns. The clinic phone number is [phone].  Please show the CHEMO ALERT CARD at check-in to the Emergency Department and triage nurse.   

## 2020-04-30 ENCOUNTER — Other Ambulatory Visit: Payer: Self-pay

## 2020-04-30 ENCOUNTER — Inpatient Hospital Stay (HOSPITAL_COMMUNITY): Payer: BC Managed Care – PPO

## 2020-04-30 VITALS — BP 126/74 | HR 86 | Temp 97.3°F | Resp 18

## 2020-04-30 DIAGNOSIS — R531 Weakness: Secondary | ICD-10-CM | POA: Diagnosis not present

## 2020-04-30 DIAGNOSIS — I517 Cardiomegaly: Secondary | ICD-10-CM | POA: Diagnosis not present

## 2020-04-30 DIAGNOSIS — Z79899 Other long term (current) drug therapy: Secondary | ICD-10-CM | POA: Diagnosis not present

## 2020-04-30 DIAGNOSIS — E119 Type 2 diabetes mellitus without complications: Secondary | ICD-10-CM | POA: Diagnosis not present

## 2020-04-30 DIAGNOSIS — K449 Diaphragmatic hernia without obstruction or gangrene: Secondary | ICD-10-CM | POA: Diagnosis not present

## 2020-04-30 DIAGNOSIS — D500 Iron deficiency anemia secondary to blood loss (chronic): Secondary | ICD-10-CM | POA: Diagnosis not present

## 2020-04-30 DIAGNOSIS — F17210 Nicotine dependence, cigarettes, uncomplicated: Secondary | ICD-10-CM | POA: Diagnosis not present

## 2020-04-30 DIAGNOSIS — M1990 Unspecified osteoarthritis, unspecified site: Secondary | ICD-10-CM | POA: Diagnosis not present

## 2020-04-30 DIAGNOSIS — R0602 Shortness of breath: Secondary | ICD-10-CM | POA: Diagnosis not present

## 2020-04-30 DIAGNOSIS — I700 Atherosclerosis of aorta: Secondary | ICD-10-CM | POA: Diagnosis not present

## 2020-04-30 DIAGNOSIS — R55 Syncope and collapse: Secondary | ICD-10-CM | POA: Diagnosis not present

## 2020-04-30 DIAGNOSIS — Z888 Allergy status to other drugs, medicaments and biological substances status: Secondary | ICD-10-CM | POA: Diagnosis not present

## 2020-04-30 DIAGNOSIS — N200 Calculus of kidney: Secondary | ICD-10-CM | POA: Diagnosis not present

## 2020-04-30 DIAGNOSIS — R5383 Other fatigue: Secondary | ICD-10-CM | POA: Diagnosis not present

## 2020-04-30 DIAGNOSIS — E785 Hyperlipidemia, unspecified: Secondary | ICD-10-CM | POA: Diagnosis not present

## 2020-04-30 DIAGNOSIS — K921 Melena: Secondary | ICD-10-CM | POA: Diagnosis not present

## 2020-04-30 MED ORDER — SODIUM CHLORIDE 0.9 % IV SOLN
200.0000 mg | Freq: Once | INTRAVENOUS | Status: AC
  Administered 2020-04-30: 15:00:00 200 mg via INTRAVENOUS
  Filled 2020-04-30: qty 200

## 2020-04-30 MED ORDER — SODIUM CHLORIDE 0.9 % IV SOLN: Freq: Once | INTRAVENOUS | Status: AC

## 2020-04-30 NOTE — Patient Instructions (Signed)
[facility] Discharge Instructions for Patients   Today you received the following chemotherapy agents   To help prevent nausea and vomiting after your treatment, we encourage you to take your nausea medication    If you develop nausea and vomiting that is not controlled by your nausea medication, call the clinic.   BELOW ARE SYMPTOMS THAT SHOULD BE REPORTED IMMEDIATELY:  *FEVER GREATER THAN 100.5 F  *CHILLS WITH OR WITHOUT FEVER  NAUSEA AND VOMITING THAT IS NOT CONTROLLED WITH YOUR NAUSEA MEDICATION  *UNUSUAL SHORTNESS OF BREATH  *UNUSUAL BRUISING OR BLEEDING  TENDERNESS IN MOUTH AND THROAT WITH OR WITHOUT PRESENCE OF ULCERS  *URINARY PROBLEMS  *BOWEL PROBLEMS  UNUSUAL RASH Items with * indicate a potential emergency and should be followed up as soon as possible.  Feel free to call the clinic should you have any questions or concerns. The clinic phone number is [phone].  Please show the CHEMO ALERT CARD at check-in to the Emergency Department and triage nurse.   

## 2020-04-30 NOTE — Progress Notes (Signed)
Patient presents today for Venofer infusion.  Vital signs stable.  Patient complains that he had some nausea and vomiting today but feels that it is his acid reflux.  Patient reports that he still fatigued but not any more he was at the last visit.  Venofer infusion given today per MD orders.  Tolerated infusion without adverse affects.  Vital signs stable.  No complaints at this time.  Discharge from clinic ambulatory in stable condition.  Alert and oriented X 3.  Follow up with [facility] as scheduled.

## 2020-05-01 ENCOUNTER — Telehealth: Payer: Self-pay | Admitting: *Deleted

## 2020-05-01 NOTE — Telephone Encounter (Signed)
Call placed to patient and patient made aware.  

## 2020-05-01 NOTE — Telephone Encounter (Signed)
Received call from patient.   Reports that patient is currently taking a series of iron infusions for the next 5 weeks. Inquired as to if he should continue Fe supplement BID.   Please advise.

## 2020-05-01 NOTE — Telephone Encounter (Signed)
If he is getting infusions he can stop the iron tablets for now

## 2020-05-04 ENCOUNTER — Other Ambulatory Visit: Payer: Self-pay

## 2020-05-04 ENCOUNTER — Telehealth (INDEPENDENT_AMBULATORY_CARE_PROVIDER_SITE_OTHER): Payer: BC Managed Care – PPO | Admitting: Nurse Practitioner

## 2020-05-04 DIAGNOSIS — J329 Chronic sinusitis, unspecified: Secondary | ICD-10-CM

## 2020-05-04 DIAGNOSIS — J309 Allergic rhinitis, unspecified: Secondary | ICD-10-CM

## 2020-05-04 MED ORDER — DOXYCYCLINE HYCLATE 100 MG PO TABS: 100.0000 mg | ORAL_TABLET | Freq: Two times a day (BID) | ORAL | 0 refills | Status: DC

## 2020-05-04 MED ORDER — LEVOCETIRIZINE DIHYDROCHLORIDE 5 MG PO TABS
5.0000 mg | ORAL_TABLET | Freq: Every evening | ORAL | 3 refills | Status: DC
Start: 2020-05-04 — End: 2020-07-17

## 2020-05-04 NOTE — Assessment & Plan Note (Signed)
Chronic, ongoing.  Was referred to ENT but previously unable to go.  Given requests for another referral today and in a better place medically and financially to go, will generate referral.  In meantime, with good response in the past, will treat with doxycycline bid x 7 days.  Continue supportive care with nasal rinses, humidification of air, and allergic rhinitis regimen.  If symptoms persist, return to clinic.

## 2020-05-04 NOTE — Progress Notes (Signed)
Subjective:    Patient ID: Stephen Roberts, male    DOB: Feb 25, 1966, 54 y.o.   MRN: [id number]  HPI: Stephen Roberts is a 54 y.o. male presenting for  Chief Complaint  Patient presents with  . Nose Problem    Pt says for the past 2 years he has been having issues with his nose being really painful. Defines ist as sinus infections. Has been on antibiotics for this several times. Says this became a problem for him after doing a cologuard. Has swollen glands. Has been given referral for ent, cannot afford due to to other medical bills. Uses saline in the morning along with nasal sprays. Says he feels so much better when he is in the fresh air as oppose to being in a enclosed space. Given fexofenadine for the sx   UPPER RESPIRATORY TRACT INFECTION Onset: 2 years ago; 2 months ago this time Worst symptom: pain in nose and dryness Fever: no Cough: yes - dry Shortness of breath: no Wheezing: no Chest pain: no Chest tightness: no Chest congestion: no Nasal congestion: no Runny nose: no  Post nasal drip: no Sneezing: yes Sore throat: no Swollen glands: left side jaw  Sinus pressure: yes; slightly in cheeks Headache: yes; dull, nagging Face pain: no Toothache: no Ear pain: no  Ear pressure: no  Eyes red/itching:yes Eye drainage/crusting: no  Nose sores: yes  Nose itchiness: no Nausea: no Vomiting: no Diarrhea: no Change in appetite: no Loss of taste/smell: no Rash: no Fatigue: yes Sick contacts: yes; grandkids go to preschool and "stay sick" Strep contacts: no  Context: fluctuating Recurrent sinusitis: yes - per patient Treatments attempted: humidifier, purifiers, outside air, saline, allegra, flonase, breathe over steam Relief with OTC medications: no  Allergies  Allergen Reactions  . Gabapentin Shortness Of Breath  . Lyrica [Pregabalin] Shortness Of Breath    Outpatient Encounter Medications as of 05/04/2020  Medication Sig  . albuterol (VENTOLIN HFA) 108 (90  Base) MCG/ACT inhaler Inhale 2 puffs into the lungs every 6 (six) hours as needed for wheezing or shortness of breath.  . docusate sodium (COLACE) 100 MG capsule Take 1 capsule (100 mg total) by mouth 2 (two) times daily.  Marland Kitchen doxycycline (VIBRA-TABS) 100 MG tablet Take 1 tablet (100 mg total) by mouth 2 (two) times daily.  . ferrous sulfate 325 (65 FE) MG EC tablet Take 1 tablet (325 mg total) by mouth 2 (two) times daily.  Marland Kitchen levocetirizine (XYZAL) 5 MG tablet Take 1 tablet (5 mg total) by mouth every evening.  . Multiple Vitamin (MULTIVITAMIN) tablet Take 1 tablet by mouth daily.  . nicotine (NICODERM CQ) 21 mg/24hr patch Place 1 patch (21 mg total) onto the skin daily.  . Omega-3 Fatty Acids (OMEGA-3 PO) Take by mouth.  . oxyCODONE-acetaminophen (PERCOCET) 5-325 MG tablet Take 1 tablet by mouth every 8 (eight) hours as needed for severe pain.  . pantoprazole (PROTONIX) 40 MG tablet Take 1 tablet (40 mg total) by mouth daily.  . TURMERIC PO Take by mouth.   No facility-administered encounter medications on file as of 05/04/2020.    Patient Active Problem List   Diagnosis Date Noted  . Recurrent sinus infections 05/04/2020  . Iron deficiency anemia due to chronic blood loss   . Generalized weakness 03/24/2020  . Lightheadedness 03/24/2020  . Microcytic anemia 03/24/2020  . Hyponatremia 03/24/2020  . Dysphagia   . RLQ abdominal pain   . Symptomatic anemia 03/23/2020  . Hyperlipidemia 02/13/2019  .  Type 2 diabetes mellitus ([place]) 03/14/2018  . Hyperglycemia 09/11/2017  . Cervical disc disorder with radiculopathy, high cervical region 05/30/2017  . Chest pain 04/28/2017  . Chronic pain syndrome 11/10/2014  . Smoker 07/30/2014  . Allergic rhinitis 07/30/2014  . Allergic dermatitis 07/30/2014  . Prostate cancer screening 07/30/2014  . Screening for colorectal cancer 07/30/2014  . Incarcerated epigastric hernia 01/11/2013  . Incarcerated umbilical hernia 11/13/1599  . Obesity,  unspecified 01/11/2013  . Tobacco use disorder 01/11/2013    Past Medical History:  Diagnosis Date  . Arthritis   . Diabetes ([place])   . GERD (gastroesophageal reflux disease)   . Heart murmur   . Pneumonia 06/27/2006    Relevant past medical, surgical, family and social history reviewed and updated as indicated. Interim medical history since our last visit reviewed.  Review of Systems  Constitutional: Negative.  Negative for activity change, appetite change and fever.  HENT: Positive for nosebleeds, sinus pain and sneezing. Negative for congestion, ear discharge, ear pain, facial swelling, mouth sores, postnasal drip, rhinorrhea, sinus pressure, sore throat and trouble swallowing.        + nose sores, dry nose, nose pain  Eyes: Negative.  Negative for pain, discharge, redness and itching.  Respiratory: Positive for cough. Negative for chest tightness, shortness of breath and wheezing.   Cardiovascular: Negative.  Negative for chest pain.  Gastrointestinal: Negative.  Negative for constipation, diarrhea, nausea and vomiting.  Endocrine: Negative.   Skin: Negative.  Negative for rash.  Neurological: Positive for headaches. Negative for dizziness, weakness and light-headedness.  Hematological: Positive for adenopathy (left side under jaw).  Psychiatric/Behavioral: Negative.     Per HPI unless specifically indicated above     Objective:    There were no vitals taken for this visit.  Wt Readings from Last 3 Encounters:  04/20/20 232 lb 6.4 oz (105.4 kg)  04/13/20 225 lb (102.1 kg)  04/01/20 232 lb (105.2 kg)    Physical Exam Constitutional:      General: He is not in acute distress.    Appearance: Normal appearance. He is not toxic-appearing.  HENT:     Head: Normocephalic and atraumatic.     Right Ear: External ear normal.     Left Ear: External ear normal.     Nose: No congestion or rhinorrhea.     Mouth/Throat:     Mouth: Mucous membranes are moist.     Pharynx:  Oropharynx is clear.  Eyes:     General: No scleral icterus.       Right eye: No discharge.        Left eye: No discharge.     Extraocular Movements: Extraocular movements intact.  Cardiovascular:     Comments: Unable to assess heart sounds via virtual visit Pulmonary:     Effort: Pulmonary effort is normal. No respiratory distress.     Comments: Unable to assess lung sounds via virtual visit; patient talking in complete sentences; no accessory muscle use Abdominal:     Comments: Unable to assess bowel sounds via virtual visit  Skin:    Coloration: Skin is not jaundiced or pale.     Findings: No erythema.  Neurological:     Mental Status: He is alert and oriented to person, place, and time. Mental status is at baseline.  Psychiatric:        Mood and Affect: Mood normal.        Behavior: Behavior normal.        Thought Content:  Thought content normal.        Judgment: Judgment normal.        Assessment & Plan:   Problem List Items Addressed This Visit      Respiratory   Allergic rhinitis - Primary    Chronic, ongoing.  Has been using flonase, sinus rinses, and oral antihistamine.  Does not feel fexofenadine has been helping much, will switch to levocetirizine 5 mg and monitor response.  Continue with allergy avoidance.      Relevant Medications   levocetirizine (XYZAL) 5 MG tablet   Recurrent sinus infections    Chronic, ongoing.  Was referred to ENT but previously unable to go.  Given requests for another referral today and in a better place medically and financially to go, will generate referral.  In meantime, with good response in the past, will treat with doxycycline bid x 7 days.  Continue supportive care with nasal rinses, humidification of air, and allergic rhinitis regimen.  If symptoms persist, return to clinic.      Relevant Medications   doxycycline (VIBRA-TABS) 100 MG tablet   levocetirizine (XYZAL) 5 MG tablet   Other Relevant Orders   Ambulatory referral to  ENT       Follow up plan: Return if symptoms worsen or fail to improve.   Due to the catastrophic nature of the COVID-19 pandemic, this visit was completed via audio and visual contact via [employer] due to the restrictions of the COVID-19 pandemic. All issues as above were discussed and addressed. Physical exam was done as above through visual confirmation on [employer]gilit. If it was felt that the patient should be evaluated in the office, they were directed there. The patient verbally consented to this visit."} . Location of the patient: home . Location of the provider: work . Those involved with this call:  . Provider: Carnella Guadalajara, DNP . CMA: Annabelle Harman, CMA . Front Desk/Registration: Eliezer Mccoy  . Time spent on call: 15 minutes with patient face to face via video conference. More than 50% of this time was spent in counseling and coordination of care. 30 minutes total spent in review of patient's record and preparation of their chart.  I verified patient identity using two factors (patient name and date of birth). Patient consents verbally to being seen via telemedicine visit today.

## 2020-05-04 NOTE — Assessment & Plan Note (Signed)
Chronic, ongoing.  Has been using flonase, sinus rinses, and oral antihistamine.  Does not feel fexofenadine has been helping much, will switch to levocetirizine 5 mg and monitor response.  Continue with allergy avoidance.

## 2020-05-04 NOTE — Patient Instructions (Signed)
Sinusitis, Adult Sinusitis is inflammation of your sinuses. Sinuses are hollow spaces in the bones around your face. Your sinuses are located:  Around your eyes.  In the middle of your forehead.  Behind your nose.  In your cheekbones. Mucus normally drains out of your sinuses. When your nasal tissues become inflamed or swollen, mucus can become trapped or blocked. This allows bacteria, viruses, and fungi to grow, which leads to infection. Most infections of the sinuses are caused by a virus. Sinusitis can develop quickly. It can last for up to 4 weeks (acute) or for more than 12 weeks (chronic). Sinusitis often develops after a cold. What are the causes? This condition is caused by anything that creates swelling in the sinuses or stops mucus from draining. This includes:  Allergies.  Asthma.  Infection from bacteria or viruses.  Deformities or blockages in your nose or sinuses.  Abnormal growths in the nose (nasal polyps).  Pollutants, such as chemicals or irritants in the air.  Infection from fungi (rare). What increases the risk? You are more likely to develop this condition if you:  Have a weak body defense system (immune system).  Do a lot of swimming or diving.  Overuse nasal sprays.  Smoke. What are the signs or symptoms? The main symptoms of this condition are pain and a feeling of pressure around the affected sinuses. Other symptoms include:  Stuffy nose or congestion.  Thick drainage from your nose.  Swelling and warmth over the affected sinuses.  Headache.  Upper toothache.  A cough that may get worse at night.  Extra mucus that collects in the throat or the back of the nose (postnasal drip).  Decreased sense of smell and taste.  Fatigue.  A fever.  Sore throat.  Bad breath. How is this diagnosed? This condition is diagnosed based on:  Your symptoms.  Your medical history.  A physical exam.  Tests to find out if your condition is  acute or chronic. This may include: ? Checking your nose for nasal polyps. ? Viewing your sinuses using a device that has a light (endoscope). ? Testing for allergies or bacteria. ? Imaging tests, such as an MRI or CT scan. In rare cases, a bone biopsy may be done to rule out more serious types of fungal sinus disease. How is this treated? Treatment for sinusitis depends on the cause and whether your condition is chronic or acute.  If caused by a virus, your symptoms should go away on their own within 10 days. You may be given medicines to relieve symptoms. They include: ? Medicines that shrink swollen nasal passages (topical intranasal decongestants). ? Medicines that treat allergies (antihistamines). ? A spray that eases inflammation of the nostrils (topical intranasal corticosteroids). ? Rinses that help get rid of thick mucus in your nose (nasal saline washes).  If caused by bacteria, your health care provider may recommend waiting to see if your symptoms improve. Most bacterial infections will get better without antibiotic medicine. You may be given antibiotics if you have: ? A severe infection. ? A weak immune system.  If caused by narrow nasal passages or nasal polyps, you may need to have surgery. Follow these instructions at home: Medicines  Take, use, or apply over-the-counter and prescription medicines only as told by your health care provider. These may include nasal sprays.  If you were prescribed an antibiotic medicine, take it as told by your health care provider. Do not stop taking the antibiotic even if you start   to feel better. Hydrate and humidify   Drink enough fluid to keep your urine pale yellow. Staying hydrated will help to thin your mucus.  Use a cool mist humidifier to keep the humidity level in your home above 50%.  Inhale steam for 10-15 minutes, 3-4 times a day, or as told by your health care provider. You can do this in the bathroom while a hot shower is  running.  Limit your exposure to cool or dry air. Rest  Rest as much as possible.  Sleep with your head raised (elevated).  Make sure you get enough sleep each night. General instructions   Apply a warm, moist washcloth to your face 3-4 times a day or as told by your health care provider. This will help with discomfort.  Wash your hands often with soap and water to reduce your exposure to germs. If soap and water are not available, use hand sanitizer.  Do not smoke. Avoid being around people who are smoking (secondhand smoke).  Keep all follow-up visits as told by your health care provider. This is important. Contact a health care provider if:  You have a fever.  Your symptoms get worse.  Your symptoms do not improve within 10 days. Get help right away if:  You have a severe headache.  You have persistent vomiting.  You have severe pain or swelling around your face or eyes.  You have vision problems.  You develop confusion.  Your neck is stiff.  You have trouble breathing. Summary  Sinusitis is soreness and inflammation of your sinuses. Sinuses are hollow spaces in the bones around your face.  This condition is caused by nasal tissues that become inflamed or swollen. The swelling traps or blocks the flow of mucus. This allows bacteria, viruses, and fungi to grow, which leads to infection.  If you were prescribed an antibiotic medicine, take it as told by your health care provider. Do not stop taking the antibiotic even if you start to feel better.  Keep all follow-up visits as told by your health care provider. This is important. This information is not intended to replace advice given to you by your health care provider. Make sure you discuss any questions you have with your health care provider. Document Revised: 10/02/2017 Document Reviewed: 10/02/2017 Elsevier Patient Education  2020 [employer]  

## 2020-05-05 ENCOUNTER — Inpatient Hospital Stay (HOSPITAL_COMMUNITY): Payer: BC Managed Care – PPO

## 2020-05-07 ENCOUNTER — Other Ambulatory Visit: Payer: Self-pay

## 2020-05-07 ENCOUNTER — Inpatient Hospital Stay (HOSPITAL_COMMUNITY): Payer: BC Managed Care – PPO

## 2020-05-07 VITALS — BP 125/75 | HR 83 | Temp 97.0°F | Resp 18

## 2020-05-07 DIAGNOSIS — F17210 Nicotine dependence, cigarettes, uncomplicated: Secondary | ICD-10-CM | POA: Diagnosis not present

## 2020-05-07 DIAGNOSIS — I700 Atherosclerosis of aorta: Secondary | ICD-10-CM | POA: Diagnosis not present

## 2020-05-07 DIAGNOSIS — K921 Melena: Secondary | ICD-10-CM | POA: Diagnosis not present

## 2020-05-07 DIAGNOSIS — I517 Cardiomegaly: Secondary | ICD-10-CM | POA: Diagnosis not present

## 2020-05-07 DIAGNOSIS — R531 Weakness: Secondary | ICD-10-CM | POA: Diagnosis not present

## 2020-05-07 DIAGNOSIS — R55 Syncope and collapse: Secondary | ICD-10-CM | POA: Diagnosis not present

## 2020-05-07 DIAGNOSIS — E119 Type 2 diabetes mellitus without complications: Secondary | ICD-10-CM | POA: Diagnosis not present

## 2020-05-07 DIAGNOSIS — D500 Iron deficiency anemia secondary to blood loss (chronic): Secondary | ICD-10-CM

## 2020-05-07 DIAGNOSIS — N200 Calculus of kidney: Secondary | ICD-10-CM | POA: Diagnosis not present

## 2020-05-07 DIAGNOSIS — R0602 Shortness of breath: Secondary | ICD-10-CM | POA: Diagnosis not present

## 2020-05-07 DIAGNOSIS — K449 Diaphragmatic hernia without obstruction or gangrene: Secondary | ICD-10-CM | POA: Diagnosis not present

## 2020-05-07 DIAGNOSIS — Z79899 Other long term (current) drug therapy: Secondary | ICD-10-CM | POA: Diagnosis not present

## 2020-05-07 DIAGNOSIS — E785 Hyperlipidemia, unspecified: Secondary | ICD-10-CM | POA: Diagnosis not present

## 2020-05-07 DIAGNOSIS — Z888 Allergy status to other drugs, medicaments and biological substances status: Secondary | ICD-10-CM | POA: Diagnosis not present

## 2020-05-07 DIAGNOSIS — R5383 Other fatigue: Secondary | ICD-10-CM | POA: Diagnosis not present

## 2020-05-07 DIAGNOSIS — M1990 Unspecified osteoarthritis, unspecified site: Secondary | ICD-10-CM | POA: Diagnosis not present

## 2020-05-07 MED ORDER — SODIUM CHLORIDE 0.9 % IV SOLN: Freq: Once | INTRAVENOUS | Status: AC

## 2020-05-07 MED ORDER — SODIUM CHLORIDE 0.9 % IV SOLN
200.0000 mg | Freq: Once | INTRAVENOUS | Status: AC
  Administered 2020-05-07: 09:00:00 200 mg via INTRAVENOUS
  Filled 2020-05-07: qty 200

## 2020-05-07 NOTE — Patient Instructions (Signed)
[facility] Discharge Instructions for Patients Receiving Chemotherapy  Today you received the following chemotherapy agents   To help prevent nausea and vomiting after your treatment, we encourage you to take your nausea medication   If you develop nausea and vomiting that is not controlled by your nausea medication, call the clinic.   BELOW ARE SYMPTOMS THAT SHOULD BE REPORTED IMMEDIATELY:  *FEVER GREATER THAN 100.5 F  *CHILLS WITH OR WITHOUT FEVER  NAUSEA AND VOMITING THAT IS NOT CONTROLLED WITH YOUR NAUSEA MEDICATION  *UNUSUAL SHORTNESS OF BREATH  *UNUSUAL BRUISING OR BLEEDING  TENDERNESS IN MOUTH AND THROAT WITH OR WITHOUT PRESENCE OF ULCERS  *URINARY PROBLEMS  *BOWEL PROBLEMS  UNUSUAL RASH Items with * indicate a potential emergency and should be followed up as soon as possible.  Feel free to call the clinic should you have any questions or concerns. The clinic phone number is [phone].  Please show the CHEMO ALERT CARD at check-in to the Emergency Department and triage nurse.   

## 2020-05-07 NOTE — Progress Notes (Signed)
Patient presents today for Venofer infusion.  Vital signs WNL.  Patient has no new complaints since last visit.  Venofer infusion given today per MD orders.  Tolerated infusion without adverse affects.  Vital signs stable.  No complaints at this time.  Discharge from clinic ambulatory in stable condition.  Alert and oriented X 3.  Follow up with [facility] as scheduled.

## 2020-05-11 ENCOUNTER — Telehealth: Payer: Self-pay | Admitting: Family Medicine

## 2020-05-11 ENCOUNTER — Encounter (HOSPITAL_COMMUNITY): Payer: Self-pay

## 2020-05-11 ENCOUNTER — Inpatient Hospital Stay (HOSPITAL_COMMUNITY): Payer: BC Managed Care – PPO

## 2020-05-11 VITALS — BP 148/92 | HR 87 | Temp 97.0°F | Resp 18

## 2020-05-11 DIAGNOSIS — D500 Iron deficiency anemia secondary to blood loss (chronic): Secondary | ICD-10-CM | POA: Diagnosis not present

## 2020-05-11 DIAGNOSIS — N200 Calculus of kidney: Secondary | ICD-10-CM | POA: Diagnosis not present

## 2020-05-11 DIAGNOSIS — I517 Cardiomegaly: Secondary | ICD-10-CM | POA: Diagnosis not present

## 2020-05-11 DIAGNOSIS — K921 Melena: Secondary | ICD-10-CM | POA: Diagnosis not present

## 2020-05-11 DIAGNOSIS — R531 Weakness: Secondary | ICD-10-CM | POA: Diagnosis not present

## 2020-05-11 DIAGNOSIS — R55 Syncope and collapse: Secondary | ICD-10-CM | POA: Diagnosis not present

## 2020-05-11 DIAGNOSIS — K449 Diaphragmatic hernia without obstruction or gangrene: Secondary | ICD-10-CM | POA: Diagnosis not present

## 2020-05-11 DIAGNOSIS — Z79899 Other long term (current) drug therapy: Secondary | ICD-10-CM | POA: Diagnosis not present

## 2020-05-11 DIAGNOSIS — I700 Atherosclerosis of aorta: Secondary | ICD-10-CM | POA: Diagnosis not present

## 2020-05-11 DIAGNOSIS — F17210 Nicotine dependence, cigarettes, uncomplicated: Secondary | ICD-10-CM | POA: Diagnosis not present

## 2020-05-11 DIAGNOSIS — E119 Type 2 diabetes mellitus without complications: Secondary | ICD-10-CM | POA: Diagnosis not present

## 2020-05-11 DIAGNOSIS — E785 Hyperlipidemia, unspecified: Secondary | ICD-10-CM | POA: Diagnosis not present

## 2020-05-11 DIAGNOSIS — Z888 Allergy status to other drugs, medicaments and biological substances status: Secondary | ICD-10-CM | POA: Diagnosis not present

## 2020-05-11 DIAGNOSIS — R5383 Other fatigue: Secondary | ICD-10-CM | POA: Diagnosis not present

## 2020-05-11 DIAGNOSIS — M1990 Unspecified osteoarthritis, unspecified site: Secondary | ICD-10-CM | POA: Diagnosis not present

## 2020-05-11 DIAGNOSIS — R0602 Shortness of breath: Secondary | ICD-10-CM | POA: Diagnosis not present

## 2020-05-11 MED ORDER — SODIUM CHLORIDE 0.9 % IV SOLN: Freq: Once | INTRAVENOUS | Status: AC

## 2020-05-11 MED ORDER — SODIUM CHLORIDE 0.9 % IV SOLN
200.0000 mg | Freq: Once | INTRAVENOUS | Status: AC
  Administered 2020-05-11: 15:00:00 200 mg via INTRAVENOUS
  Filled 2020-05-11: qty 200

## 2020-05-11 NOTE — Telephone Encounter (Signed)
Patient left vm on Christina's vm asking if we were given Boosters. I have called patient back and left vm stating we don't have booster here and if he had additional questions to call us back.  CB# [phone]

## 2020-05-11 NOTE — Patient Instructions (Signed)
[facility] at [facility] Discharge Instructions  Received Venofer infusion today. Follow-up as scheduled   Thank you for choosing [facility] at [facility] to provide your oncology and hematology care.  To afford each patient quality time with our provider, please arrive at least 15 minutes before your scheduled appointment time.   If you have a lab appointment with the Cancer Center please come in thru the Main Entrance and check in at the main information desk.  You need to re-schedule your appointment should you arrive 10 or more minutes late.  We strive to give you quality time with our providers, and arriving late affects you and other patients whose appointments are after yours.  Also, if you no show three or more times for appointments you may be dismissed from the clinic at the providers discretion.     Again, thank you for choosing [facility].  Our hope is that these requests will decrease the amount of time that you wait before being seen by our physicians.       _____________________________________________________________  Should you have questions after your visit to [facility], please contact our office at [phone] and follow the prompts.  Our office hours are 8:00 a.m. and 4:30 p.m. Monday - Friday.  Please note that voicemails left after 4:00 p.m. may not be returned until the following business day.  We are closed weekends and major holidays.  You do have access to a nurse 24-7, just call the main number to the clinic [phone] and do not press any options, hold on the line and a nurse will answer the phone.    For prescription refill requests, have your pharmacy contact our office and allow 72 hours.    Due to Covid, you will need to wear a mask upon entering the hospital. If you do not have a mask, a mask will be given to you at the Main Entrance upon arrival. For doctor visits, patients may have  1 support person age 18 or older with them. For treatment visits, patients can not have anyone with them due to social distancing guidelines and our immunocompromised population.     

## 2020-05-11 NOTE — Progress Notes (Signed)
[name] tolerated Venofer infusion well without complaints or incident. Peripheral IV site checked with positive blood return noted prior to and after infusion. VSS upon discharge. Pt discharged self ambulatory in satisfactory condition

## 2020-05-13 ENCOUNTER — Encounter (HOSPITAL_COMMUNITY): Payer: Self-pay

## 2020-05-13 ENCOUNTER — Other Ambulatory Visit: Payer: Self-pay

## 2020-05-13 ENCOUNTER — Inpatient Hospital Stay (HOSPITAL_COMMUNITY): Payer: BC Managed Care – PPO

## 2020-05-13 VITALS — BP 136/84 | HR 88 | Temp 97.1°F | Resp 18

## 2020-05-13 DIAGNOSIS — R531 Weakness: Secondary | ICD-10-CM | POA: Diagnosis not present

## 2020-05-13 DIAGNOSIS — K921 Melena: Secondary | ICD-10-CM | POA: Diagnosis not present

## 2020-05-13 DIAGNOSIS — E785 Hyperlipidemia, unspecified: Secondary | ICD-10-CM | POA: Diagnosis not present

## 2020-05-13 DIAGNOSIS — R5383 Other fatigue: Secondary | ICD-10-CM | POA: Diagnosis not present

## 2020-05-13 DIAGNOSIS — M1990 Unspecified osteoarthritis, unspecified site: Secondary | ICD-10-CM | POA: Diagnosis not present

## 2020-05-13 DIAGNOSIS — Z888 Allergy status to other drugs, medicaments and biological substances status: Secondary | ICD-10-CM | POA: Diagnosis not present

## 2020-05-13 DIAGNOSIS — E119 Type 2 diabetes mellitus without complications: Secondary | ICD-10-CM | POA: Diagnosis not present

## 2020-05-13 DIAGNOSIS — R0602 Shortness of breath: Secondary | ICD-10-CM | POA: Diagnosis not present

## 2020-05-13 DIAGNOSIS — D500 Iron deficiency anemia secondary to blood loss (chronic): Secondary | ICD-10-CM

## 2020-05-13 DIAGNOSIS — K449 Diaphragmatic hernia without obstruction or gangrene: Secondary | ICD-10-CM | POA: Diagnosis not present

## 2020-05-13 DIAGNOSIS — N200 Calculus of kidney: Secondary | ICD-10-CM | POA: Diagnosis not present

## 2020-05-13 DIAGNOSIS — I700 Atherosclerosis of aorta: Secondary | ICD-10-CM | POA: Diagnosis not present

## 2020-05-13 DIAGNOSIS — F17210 Nicotine dependence, cigarettes, uncomplicated: Secondary | ICD-10-CM | POA: Diagnosis not present

## 2020-05-13 DIAGNOSIS — Z79899 Other long term (current) drug therapy: Secondary | ICD-10-CM | POA: Diagnosis not present

## 2020-05-13 DIAGNOSIS — R55 Syncope and collapse: Secondary | ICD-10-CM | POA: Diagnosis not present

## 2020-05-13 DIAGNOSIS — I517 Cardiomegaly: Secondary | ICD-10-CM | POA: Diagnosis not present

## 2020-05-13 MED ORDER — SODIUM CHLORIDE 0.9 % IV SOLN: Freq: Once | INTRAVENOUS | Status: AC

## 2020-05-13 MED ORDER — SODIUM CHLORIDE 0.9 % IV SOLN
200.0000 mg | Freq: Once | INTRAVENOUS | Status: AC
  Administered 2020-05-13: 15:00:00 200 mg via INTRAVENOUS
  Filled 2020-05-13: qty 200

## 2020-05-13 NOTE — Patient Instructions (Signed)
[facility] at [facility] Discharge Instructions  Received Venofer infusion today. Follow-up as scheduled   Thank you for choosing  [facility] Center at [facility] to provide your oncology and hematology care.  To afford each patient quality time with our provider, please arrive at least 15 minutes before your scheduled appointment time.   If you have a lab appointment with the Cancer Center please come in thru the Main Entrance and check in at the main information desk.  You need to re-schedule your appointment should you arrive 10 or more minutes late.  We strive to give you quality time with our providers, and arriving late affects you and other patients whose appointments are after yours.  Also, if you no show three or more times for appointments you may be dismissed from the clinic at the providers discretion.     Again, thank you for choosing [facility].  Our hope is that these requests will decrease the amount of time that you wait before being seen by our physicians.       _____________________________________________________________  Should you have questions after your visit to [facility], please contact our office at [phone] and follow the prompts.  Our office hours are 8:00 a.m. and 4:30 p.m. Monday - Friday.  Please note that voicemails left after 4:00 p.m. may not be returned until the following business day.  We are closed weekends and major holidays.  You do have access to a nurse 24-7, just call the main number to the clinic [phone] and do not press any options, hold on the line and a nurse will answer the phone.    For prescription refill requests, have your pharmacy contact our office and allow 72 hours.    Due to Covid, you will need to wear a mask upon entering the hospital. If you do not have a mask, a mask will be given to you at the Main Entrance upon arrival. For doctor visits, patients may have  1 support person age 18 or older with them. For treatment visits, patients can not have anyone with them due to social distancing guidelines and our immunocompromised population.     

## 2020-05-13 NOTE — Progress Notes (Signed)
Stephen Roberts tolerated Venofer infusion well without complaints or incident. Peripheral IV site checked with positive blood return noted prior to and after infusion. VSS upon discharge. Pt discharged self ambulatory in satisfactory condition 

## 2020-05-23 ENCOUNTER — Other Ambulatory Visit: Payer: Self-pay | Admitting: Family Medicine

## 2020-06-08 ENCOUNTER — Encounter (INDEPENDENT_AMBULATORY_CARE_PROVIDER_SITE_OTHER): Payer: Self-pay | Admitting: Otolaryngology

## 2020-06-08 ENCOUNTER — Other Ambulatory Visit: Payer: Self-pay

## 2020-06-08 ENCOUNTER — Ambulatory Visit (INDEPENDENT_AMBULATORY_CARE_PROVIDER_SITE_OTHER): Payer: BC Managed Care – PPO | Admitting: Otolaryngology

## 2020-06-08 VITALS — Temp 97.0°F

## 2020-06-08 DIAGNOSIS — J3489 Other specified disorders of nose and nasal sinuses: Secondary | ICD-10-CM | POA: Diagnosis not present

## 2020-06-08 NOTE — Progress Notes (Signed)
HPI: Stephen Roberts is a 55 y.o. male who presents is referred by Noemi Chapel, NP for evaluation of nasal complaints he has had for about 2 years now.  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  He was also recently several months ago found to be severely anemic and was given a 2 unit blood transfusion.  The reason for the anemia is still being worked up.  He stated that following the transfusion he developed some swelling of lymph nodes in his neck and made the nose worse. In November of this past year he apparently felt really weak and had a blood test that showed a hemoglobin of 6.2.  He apparently was immediately transfused a couple units.  He states that his last blood test showed hemoglobin of 8.4.  Past Medical History:  Diagnosis Date  . Arthritis   . Diabetes ([place])   . GERD (gastroesophageal reflux disease)   . Heart murmur   . Pneumonia 06/27/2006   Past Surgical History:  Procedure Laterality Date  . ANTERIOR FUSION CERVICAL SPINE    . COLONOSCOPY WITH PROPOFOL N/A 03/26/2020   Procedure: COLONOSCOPY WITH PROPOFOL;  Surgeon: Daneil Dolin, MD;  [facility]: [facility];  Service: Endoscopy;  Laterality: N/A;  . disc in neck Left 05/2017  . ESOPHAGEAL DILATION N/A 03/25/2020   Procedure: ESOPHAGEAL DILATION;  Surgeon: Rogene Houston, MD;  [facility]: [facility];  Service: Endoscopy;  Laterality: N/A;  . ESOPHAGOGASTRODUODENOSCOPY (EGD) WITH PROPOFOL N/A 03/25/2020   Procedure: ESOPHAGOGASTRODUODENOSCOPY (EGD) WITH PROPOFOL;  Surgeon: Rogene Houston, MD;  [facility]: [facility];  Service: Endoscopy;  Laterality: N/A;  . GIVENS CAPSULE STUDY N/A 04/13/2020   Procedure: GIVENS CAPSULE STUDY;  Surgeon: Daneil Dolin, MD;  [facility]: [facility];  Service: Endoscopy;  Laterality: N/A;  7:30am  . INSERTION OF MESH  N/A 01/23/2013   Procedure: INSERTION OF MESH;  Surgeon: Adin Hector, MD;  Location: [facility];  Service: General;  Laterality: N/A;  . POLYPECTOMY  03/26/2020   Procedure: POLYPECTOMY;  Surgeon: Daneil Dolin, MD;  Location: [facility];  Service: Endoscopy;;  . TONSILLECTOMY     as child  . VENTRAL HERNIA REPAIR N/A 01/23/2013   Procedure: LAPAROSCOPIC MULTIPLE VENTRAL HERNIAS;  Surgeon: Adin Hector, MD;  Location: [facility];  Service: General;  Laterality: N/A;   Social History   Socioeconomic History  . Marital status: Married    Spouse name: Not on file  . Number of children: Not on file  . Years of education: Not on file  . Highest education level: Not on file  Occupational History  . Not on file  Tobacco Use  . Smoking status: Current Every Day Smoker    Packs/day: 0.50    Years: 42.00    Pack years: 21.00    Types: Cigarettes    Start date: 54  . Smokeless tobacco: Never Used  Substance and Sexual Activity  . Alcohol use: Yes    Comment: moderately  . Drug use: Not Currently    Types: Cocaine    Comment: states he has tried it all. Last used cocaine a few months ago.   Marland Kitchen Sexual activity: Yes  Other Topics Concern  . Not on file  Social History Narrative  . Not on  file   Social Determinants of Health   Financial Resource Strain: Low Risk   . Difficulty of Paying Living Expenses: Not hard at all  Food Insecurity: No Food Insecurity  . Worried About Charity fundraiser in the Last Year: Never true  . Ran Out of Food in the Last Year: Never true  Transportation Needs: No Transportation Needs  . Lack of Transportation (Medical): No  . Lack of Transportation (Non-Medical): No  Physical Activity: Inactive  . Days of Exercise per Week: 0 days  . Minutes of Exercise per Session: 0 min  Stress: Stress Concern Present  . Feeling of Stress : Very much  Social Connections: Moderately Isolated  . Frequency of Communication with Friends and Family: More than  three times a week  . Frequency of Social Gatherings with Friends and Family: More than three times a week  . Attends Religious Services: Never  . Active Member of Clubs or Organizations: No  . Attends Archivist Meetings: Never  . Marital Status: Married   Family History  Problem Relation Age of Onset  . Depression Mother   . Hearing loss Mother   . Hyperlipidemia Mother   . Hypertension Mother   . Stroke Mother   . Early death Father   . Diabetes Brother   . Pancreatic cancer Paternal Uncle   . Colon cancer Neg Hx    Allergies  Allergen Reactions  . Gabapentin Shortness Of Breath  . Lyrica [Pregabalin] Shortness Of Breath   Prior to Admission medications   Medication Sig Start Date End Date Taking? Authorizing Provider  albuterol (VENTOLIN HFA) 108 (90 Base) MCG/ACT inhaler INHALE 2 PUFFS INTO THE LUNGS EVERY 6 HOURS AS NEEDED FOR WHEEZING OR SHORTNESS OF BREATH 05/25/20   [place], Modena Nunnery, MD  docusate sodium (COLACE) 100 MG capsule Take 1 capsule (100 mg total) by mouth 2 (two) times daily. 03/26/20 03/26/21  Kathie Dike, MD  doxycycline (VIBRA-TABS) 100 MG tablet Take 1 tablet (100 mg total) by mouth 2 (two) times daily. 05/04/20   Eulogio Bear, NP  ferrous sulfate 325 (65 FE) MG EC tablet Take 1 tablet (325 mg total) by mouth 2 (two) times daily. 03/26/20 03/26/21  Kathie Dike, MD  levocetirizine (XYZAL) 5 MG tablet Take 1 tablet (5 mg total) by mouth every evening. 05/04/20   Eulogio Bear, NP  Multiple Vitamin (MULTIVITAMIN) tablet Take 1 tablet by mouth daily.    [provider]  nicotine (NICODERM CQ) 21 mg/24hr patch Place 1 patch (21 mg total) onto the skin daily. 04/01/20   Alycia Rossetti, MD  Omega-3 Fatty Acids (OMEGA-3 PO) Take by mouth.    [provider]  oxyCODONE-acetaminophen (PERCOCET) 5-325 MG tablet Take 1 tablet by mouth every 8 (eight) hours as needed for severe pain. 04/01/20   [place], Modena Nunnery, MD   pantoprazole (PROTONIX) 40 MG tablet Take 1 tablet (40 mg total) by mouth daily. 03/26/20 03/26/21  Kathie Dike, MD  TURMERIC PO Take by mouth.    [provider]     Positive ROS: Otherwise negative  All other systems have been reviewed and were otherwise negative with the exception of those mentioned in the HPI and as above.  Physical Exam: Constitutional: Alert, well-appearing, no acute distress Ears: External ears without lesions or tenderness. Ear canals are clear bilaterally with intact, clear TMs bilaterally. Nasal: External nose without lesions.  He has crusting along the inferior aspect of the nostrils bilaterally within  the nasal vestibule.  More posteriorly within the nasal cavity nasal passages were clear.  Both middle meatus regions were clear with no obvious mucopurulent discharge noted. Oral: Lips and gums without lesions. Tongue and palate mucosa without lesions. Posterior oropharynx clear.  A crown of a left lower molar was missing but patient is having no pain related to this tooth.  Minimal erythema but no evidence of abscess. Neck: No significant palpable adenopathy noted in neck.  He did have one small left submandibular lymph node but this is mobile and approximately a centimeter in size. Respiratory: Breathing comfortably  Skin: No facial/neck lesions or rash noted.  Procedures  Assessment: Patient has crusting scabbing and erythema of the nasal vestibule on both sides.  Presently no clinical evidence of active sinus infection.  Plan: Discussed with him concerning use of mupirocin 2% ointment twice daily within the nasal vestibules on both sides.  I also prescribed 1 week course of doxycycline 100 mg twice daily.  He has been on this previously several weeks ago.   Radene Journey, MD   CC:

## 2020-06-23 ENCOUNTER — Other Ambulatory Visit: Payer: Self-pay

## 2020-06-23 ENCOUNTER — Inpatient Hospital Stay (HOSPITAL_COMMUNITY): Payer: BC Managed Care – PPO | Attending: Hematology

## 2020-06-23 DIAGNOSIS — D500 Iron deficiency anemia secondary to blood loss (chronic): Secondary | ICD-10-CM

## 2020-06-23 DIAGNOSIS — R590 Localized enlarged lymph nodes: Secondary | ICD-10-CM | POA: Insufficient documentation

## 2020-06-23 DIAGNOSIS — R5381 Other malaise: Secondary | ICD-10-CM | POA: Insufficient documentation

## 2020-06-23 DIAGNOSIS — Z79899 Other long term (current) drug therapy: Secondary | ICD-10-CM | POA: Insufficient documentation

## 2020-06-23 DIAGNOSIS — E669 Obesity, unspecified: Secondary | ICD-10-CM | POA: Diagnosis not present

## 2020-06-23 DIAGNOSIS — Z833 Family history of diabetes mellitus: Secondary | ICD-10-CM | POA: Diagnosis not present

## 2020-06-23 DIAGNOSIS — Z818 Family history of other mental and behavioral disorders: Secondary | ICD-10-CM | POA: Insufficient documentation

## 2020-06-23 DIAGNOSIS — G894 Chronic pain syndrome: Secondary | ICD-10-CM | POA: Diagnosis not present

## 2020-06-23 DIAGNOSIS — Z8349 Family history of other endocrine, nutritional and metabolic diseases: Secondary | ICD-10-CM | POA: Insufficient documentation

## 2020-06-23 DIAGNOSIS — K449 Diaphragmatic hernia without obstruction or gangrene: Secondary | ICD-10-CM | POA: Diagnosis not present

## 2020-06-23 DIAGNOSIS — Z8249 Family history of ischemic heart disease and other diseases of the circulatory system: Secondary | ICD-10-CM | POA: Insufficient documentation

## 2020-06-23 DIAGNOSIS — D649 Anemia, unspecified: Secondary | ICD-10-CM | POA: Diagnosis not present

## 2020-06-23 DIAGNOSIS — F419 Anxiety disorder, unspecified: Secondary | ICD-10-CM | POA: Diagnosis not present

## 2020-06-23 DIAGNOSIS — Z888 Allergy status to other drugs, medicaments and biological substances status: Secondary | ICD-10-CM | POA: Diagnosis not present

## 2020-06-23 DIAGNOSIS — E119 Type 2 diabetes mellitus without complications: Secondary | ICD-10-CM | POA: Diagnosis not present

## 2020-06-23 DIAGNOSIS — Z800 Family history of malignant neoplasm of digestive organs: Secondary | ICD-10-CM | POA: Diagnosis not present

## 2020-06-23 DIAGNOSIS — Z823 Family history of stroke: Secondary | ICD-10-CM | POA: Diagnosis not present

## 2020-06-23 DIAGNOSIS — E785 Hyperlipidemia, unspecified: Secondary | ICD-10-CM | POA: Diagnosis not present

## 2020-06-23 DIAGNOSIS — Z822 Family history of deafness and hearing loss: Secondary | ICD-10-CM | POA: Insufficient documentation

## 2020-06-23 DIAGNOSIS — F17210 Nicotine dependence, cigarettes, uncomplicated: Secondary | ICD-10-CM | POA: Diagnosis not present

## 2020-06-23 DIAGNOSIS — R531 Weakness: Secondary | ICD-10-CM | POA: Insufficient documentation

## 2020-06-23 DIAGNOSIS — K429 Umbilical hernia without obstruction or gangrene: Secondary | ICD-10-CM | POA: Insufficient documentation

## 2020-06-23 LAB — CBC WITH DIFFERENTIAL/PLATELET
Abs Immature Granulocytes: 0.01 10*3/uL (ref 0.00–0.07)
Basophils Absolute: 0.1 10*3/uL (ref 0.0–0.1)
Basophils Relative: 1 %
Eosinophils Absolute: 0.3 10*3/uL (ref 0.0–0.5)
Eosinophils Relative: 4 %
HCT: 41.2 % (ref 39.0–52.0)
Hemoglobin: 12.6 g/dL — ABNORMAL LOW (ref 13.0–17.0)
Immature Granulocytes: 0 %
Lymphocytes Relative: 26 %
Lymphs Abs: 1.5 10*3/uL (ref 0.7–4.0)
MCH: 28 pg (ref 26.0–34.0)
MCHC: 30.6 g/dL (ref 30.0–36.0)
MCV: 91.6 fL (ref 80.0–100.0)
Monocytes Absolute: 0.6 10*3/uL (ref 0.1–1.0)
Monocytes Relative: 10 %
Neutro Abs: 3.3 10*3/uL (ref 1.7–7.7)
Neutrophils Relative %: 59 %
Platelets: 214 10*3/uL (ref 150–400)
RBC: 4.5 MIL/uL (ref 4.22–5.81)
RDW: 18.9 % — ABNORMAL HIGH (ref 11.5–15.5)
WBC: 5.7 10*3/uL (ref 4.0–10.5)
nRBC: 0 % (ref 0.0–0.2)

## 2020-06-23 LAB — IRON AND TIBC
Iron: 173 ug/dL (ref 45–182)
Saturation Ratios: 43 % — ABNORMAL HIGH (ref 17.9–39.5)
TIBC: 399 ug/dL (ref 250–450)
UIBC: 226 ug/dL

## 2020-06-23 LAB — VITAMIN B12: Vitamin B-12: 684 pg/mL (ref 180–914)

## 2020-06-23 LAB — FERRITIN: Ferritin: 12 ng/mL — ABNORMAL LOW (ref 24–336)

## 2020-06-24 NOTE — Progress Notes (Signed)
Thank you!  Faythe Casa, NP 06/24/2020 1:07 PM

## 2020-06-26 ENCOUNTER — Other Ambulatory Visit: Payer: Self-pay | Admitting: Family Medicine

## 2020-06-26 DIAGNOSIS — G8929 Other chronic pain: Secondary | ICD-10-CM

## 2020-06-26 DIAGNOSIS — G894 Chronic pain syndrome: Secondary | ICD-10-CM

## 2020-06-26 DIAGNOSIS — M549 Dorsalgia, unspecified: Secondary | ICD-10-CM

## 2020-06-26 NOTE — Progress Notes (Signed)
[facility] pain clinic did not accept him - Dr. [facility]  I have referred him to another pain management in [facility]

## 2020-06-30 ENCOUNTER — Other Ambulatory Visit: Payer: Self-pay

## 2020-06-30 ENCOUNTER — Inpatient Hospital Stay (HOSPITAL_COMMUNITY): Payer: BC Managed Care – PPO

## 2020-06-30 ENCOUNTER — Inpatient Hospital Stay (HOSPITAL_BASED_OUTPATIENT_CLINIC_OR_DEPARTMENT_OTHER): Payer: BC Managed Care – PPO | Admitting: Oncology

## 2020-06-30 VITALS — BP 130/86 | HR 66 | Temp 97.0°F | Resp 20 | Wt 232.4 lb

## 2020-06-30 DIAGNOSIS — Z888 Allergy status to other drugs, medicaments and biological substances status: Secondary | ICD-10-CM | POA: Diagnosis not present

## 2020-06-30 DIAGNOSIS — Z833 Family history of diabetes mellitus: Secondary | ICD-10-CM | POA: Diagnosis not present

## 2020-06-30 DIAGNOSIS — D500 Iron deficiency anemia secondary to blood loss (chronic): Secondary | ICD-10-CM | POA: Diagnosis not present

## 2020-06-30 DIAGNOSIS — F419 Anxiety disorder, unspecified: Secondary | ICD-10-CM | POA: Diagnosis not present

## 2020-06-30 DIAGNOSIS — R5381 Other malaise: Secondary | ICD-10-CM | POA: Diagnosis not present

## 2020-06-30 DIAGNOSIS — E119 Type 2 diabetes mellitus without complications: Secondary | ICD-10-CM | POA: Diagnosis not present

## 2020-06-30 DIAGNOSIS — Z79899 Other long term (current) drug therapy: Secondary | ICD-10-CM | POA: Diagnosis not present

## 2020-06-30 DIAGNOSIS — K449 Diaphragmatic hernia without obstruction or gangrene: Secondary | ICD-10-CM | POA: Diagnosis not present

## 2020-06-30 DIAGNOSIS — R591 Generalized enlarged lymph nodes: Secondary | ICD-10-CM

## 2020-06-30 DIAGNOSIS — R590 Localized enlarged lymph nodes: Secondary | ICD-10-CM | POA: Diagnosis not present

## 2020-06-30 DIAGNOSIS — Z8249 Family history of ischemic heart disease and other diseases of the circulatory system: Secondary | ICD-10-CM | POA: Diagnosis not present

## 2020-06-30 DIAGNOSIS — F17210 Nicotine dependence, cigarettes, uncomplicated: Secondary | ICD-10-CM | POA: Diagnosis not present

## 2020-06-30 DIAGNOSIS — G894 Chronic pain syndrome: Secondary | ICD-10-CM | POA: Diagnosis not present

## 2020-06-30 DIAGNOSIS — K429 Umbilical hernia without obstruction or gangrene: Secondary | ICD-10-CM | POA: Diagnosis not present

## 2020-06-30 DIAGNOSIS — D649 Anemia, unspecified: Secondary | ICD-10-CM | POA: Diagnosis not present

## 2020-06-30 DIAGNOSIS — E669 Obesity, unspecified: Secondary | ICD-10-CM | POA: Diagnosis not present

## 2020-06-30 DIAGNOSIS — R531 Weakness: Secondary | ICD-10-CM

## 2020-06-30 DIAGNOSIS — E785 Hyperlipidemia, unspecified: Secondary | ICD-10-CM | POA: Diagnosis not present

## 2020-06-30 LAB — COMPREHENSIVE METABOLIC PANEL
ALT: 20 U/L (ref 0–44)
AST: 20 U/L (ref 15–41)
Albumin: 4.3 g/dL (ref 3.5–5.0)
Alkaline Phosphatase: 96 U/L (ref 38–126)
Anion gap: 7 (ref 5–15)
BUN: 19 mg/dL (ref 6–20)
CO2: 24 mmol/L (ref 22–32)
Calcium: 9.2 mg/dL (ref 8.9–10.3)
Chloride: 103 mmol/L (ref 98–111)
Creatinine, Ser: 0.7 mg/dL (ref 0.61–1.24)
GFR, Estimated: >60 mL/min (ref >60)
Glucose, Bld: 89 mg/dL (ref 70–99)
Potassium: 3.7 mmol/L (ref 3.5–5.1)
Sodium: 134 mmol/L — ABNORMAL LOW (ref 135–145)
Total Bilirubin: 0.4 mg/dL (ref 0.3–1.2)
Total Protein: 8.2 g/dL — ABNORMAL HIGH (ref 6.5–8.1)

## 2020-06-30 LAB — HIV ANTIBODY (ROUTINE TESTING W REFLEX): HIV Screen 4th Generation wRfx: NONREACTIVE

## 2020-06-30 LAB — SEDIMENTATION RATE: Sed Rate: 28 mm/hr — ABNORMAL HIGH (ref 0–16)

## 2020-06-30 LAB — C-REACTIVE PROTEIN: CRP: 1.2 mg/dL — ABNORMAL HIGH (ref <1.0)

## 2020-06-30 MED ORDER — PREDNISONE 10 MG (21) PO TBPK: ORAL_TABLET | ORAL | 0 refills | Status: DC

## 2020-06-30 NOTE — Progress Notes (Signed)
Patient Care Team: [place], Modena Nunnery, MD as PCP - General (Family Medicine)  CHIEF COMPLAINTS: F/u anemia   Current treatment: IV Venofer  HISTORY OF PRESENTING ILLNESS:  Mr. Delrossi is a 55 year old male with past medical history significant for type 2 diabetes, cervical radiculopathy, incarcerated umbilical hernia, obesity, tobacco use, chronic pain syndrome, hyperlipidemia and recently diagnosed with iron deficiency anemia.  He was hospitalized in November 2021 with melena and required 2 units of PRBC.  Work-up showed a hiatal hernia, multiple polyps and possible lesion but unable to visualize secondary to poor prep.  IDA thought to be secondary to blood loss from NSAID use.  He received 5 doses of IV Venofer last given on 05/13/2020.  He presents back today to review most recent lab work.  Patient denies feeling any different since his IV iron infusion.  Since he was hospitalized and received his blood transfusions he has not felt well.  Admits to intermittent swelling of lymph nodes in his neck, armpits and groin.  He states they are painful and tender to touch.  Denies any fevers or recent illness.  Denies any injury.  Denies any new medications.  He is anxious. He has no family history of blood disorders.  He is never been diagnosed with an autoimmune disorder.  He denied recent COVID-19 infection.  He is vaccinated but not boosted. He has concerns that his blood transfusion gave him HIV.  Notes changes in his appetite, increased weakness and generalized malaise.  His wife has noticed some confusion or "forgetfulness".  He denies any abdominal concerns including constipation or diarrhea.  Denies any nausea or vomiting.  States he just feels "something is wrong".  MEDICAL HISTORY:  Past Medical History:  Diagnosis Date  . Arthritis   . Diabetes ([place])   . GERD (gastroesophageal reflux disease)   . Heart murmur   . Pneumonia 06/27/2006    SURGICAL HISTORY: Past Surgical History:   Procedure Laterality Date  . ANTERIOR FUSION CERVICAL SPINE    . COLONOSCOPY WITH PROPOFOL N/A 03/26/2020   Procedure: COLONOSCOPY WITH PROPOFOL;  Surgeon: Daneil Dolin, MD;  [facility];  Service: Endoscopy;  Laterality: N/A;  . disc in neck Left 05/2017  . ESOPHAGEAL DILATION N/A 03/25/2020   Procedure: ESOPHAGEAL DILATION;  Surgeon: Rogene Houston, MD;  [facility];  Service: Endoscopy;  Laterality: N/A;  . ESOPHAGOGASTRODUODENOSCOPY (EGD) WITH PROPOFOL N/A 03/25/2020   Procedure: ESOPHAGOGASTRODUODENOSCOPY (EGD) WITH PROPOFOL;  Surgeon: Rogene Houston, MD;  [facility];  Service: Endoscopy;  Laterality: N/A;  . GIVENS CAPSULE STUDY N/A 04/13/2020   Procedure: GIVENS CAPSULE STUDY;  Surgeon: Daneil Dolin, MD;  [facility];  Service: Endoscopy;  Laterality: N/A;  7:30am  . INSERTION OF MESH N/A 01/23/2013   Procedure: INSERTION OF MESH;  Surgeon: Adin Hector, MD;  Location: [facility];  Service: General;  Laterality: N/A;  . POLYPECTOMY  03/26/2020   Procedure: POLYPECTOMY;  Surgeon: Daneil Dolin, MD;  [facility];  Service: Endoscopy;;  . TONSILLECTOMY     as child  . VENTRAL HERNIA REPAIR N/A 01/23/2013   Procedure: LAPAROSCOPIC MULTIPLE VENTRAL HERNIAS;  Surgeon: Adin Hector, MD;  Location: [facility];  Service: General;  Laterality: N/A;    SOCIAL HISTORY: Social History   Socioeconomic History  . Marital status: Married    Spouse name: Not on file  . Number of children: Not on file  .  Years of education: Not on file  . Highest education level: Not on file  Occupational History  . Not on file  Tobacco Use  . Smoking status: Current Every Day Smoker    Packs/day: 0.50    Years: 42.00    Pack years: 21.00    Types: Cigarettes    Start date: 91  . Smokeless tobacco: Never Used  Substance and Sexual Activity  . Alcohol use: Yes    Comment: moderately  . Drug use: Not Currently    Types: Cocaine     Comment: states he has tried it all. Last used cocaine a few months ago.   Marland Kitchen Sexual activity: Yes  Other Topics Concern  . Not on file  Social History Narrative  . Not on file   Social Determinants of Health   Financial Resource Strain: Low Risk   . Difficulty of Paying Living Expenses: Not hard at all  Food Insecurity: No Food Insecurity  . Worried About Charity fundraiser in the Last Year: Never true  . Ran Out of Food in the Last Year: Never true  Transportation Needs: No Transportation Needs  . Lack of Transportation (Medical): No  . Lack of Transportation (Non-Medical): No  Physical Activity: Inactive  . Days of Exercise per Week: 0 days  . Minutes of Exercise per Session: 0 min  Stress: Stress Concern Present  . Feeling of Stress : Very much  Social Connections: Moderately Isolated  . Frequency of Communication with Friends and Family: More than three times a week  . Frequency of Social Gatherings with Friends and Family: More than three times a week  . Attends Religious Services: Never  . Active Member of Clubs or Organizations: No  . Attends Archivist Meetings: Never  . Marital Status: Married  Human resources officer Violence: Not At Risk  . Fear of Current or Ex-Partner: No  . Emotionally Abused: No  . Physically Abused: No  . Sexually Abused: No    FAMILY HISTORY: Family History  Problem Relation Age of Onset  . Depression Mother   . Hearing loss Mother   . Hyperlipidemia Mother   . Hypertension Mother   . Stroke Mother   . Early death Father   . Diabetes Brother   . Pancreatic cancer Paternal Uncle   . Colon cancer Neg Hx     ALLERGIES:  is allergic to gabapentin and lyrica [pregabalin].  MEDICATIONS:  Current Outpatient Medications  Medication Sig Dispense Refill  . albuterol (VENTOLIN HFA) 108 (90 Base) MCG/ACT inhaler INHALE 2 PUFFS INTO THE LUNGS EVERY 6 HOURS AS NEEDED FOR WHEEZING OR SHORTNESS OF BREATH 6.7 g 1  . docusate sodium  (COLACE) 100 MG capsule Take 1 capsule (100 mg total) by mouth 2 (two) times daily. 60 capsule 2  . ferrous sulfate 325 (65 FE) MG EC tablet Take 1 tablet (325 mg total) by mouth 2 (two) times daily. 60 tablet 3  . Multiple Vitamin (MULTIVITAMIN) tablet Take 1 tablet by mouth daily.    . nicotine (NICODERM CQ) 21 mg/24hr patch Place 1 patch (21 mg total) onto the skin daily. 28 patch 0  . Omega-3 Fatty Acids (OMEGA-3 PO) Take by mouth.    . oxyCODONE-acetaminophen (PERCOCET) 5-325 MG tablet Take 1 tablet by mouth every 8 (eight) hours as needed for severe pain. 45 tablet 0  . pantoprazole (PROTONIX) 40 MG tablet Take 1 tablet (40 mg total) by mouth daily. 30 tablet 1  . predniSONE (STERAPRED  UNI-PAK 21 TAB) 10 MG (21) TBPK tablet Take as directed. 21 tablet 0  . TURMERIC PO Take by mouth.    . levocetirizine (XYZAL) 5 MG tablet Take 1 tablet (5 mg total) by mouth every evening. (Patient not taking: Reported on 06/30/2020) 90 tablet 3   No current facility-administered medications for this visit.    REVIEW OF SYSTEMS:   Review of Systems  Constitutional: Positive for malaise/fatigue. Negative for chills, fever and weight loss.  HENT: Negative for congestion, ear pain and tinnitus.   Eyes: Negative.  Negative for blurred vision and double vision.  Respiratory: Negative.  Negative for cough, sputum production and shortness of breath.   Cardiovascular: Negative.  Negative for chest pain, palpitations and leg swelling.  Gastrointestinal: Negative.  Negative for abdominal pain, constipation, diarrhea, nausea and vomiting.  Genitourinary: Negative for dysuria, frequency and urgency.  Musculoskeletal: Positive for joint pain and neck pain. Negative for back pain and falls.  Skin: Negative.  Negative for rash.  Neurological: Positive for weakness. Negative for headaches.  Endo/Heme/Allergies: Negative.  Does not bruise/bleed easily.  Psychiatric/Behavioral: Positive for depression and memory loss.  The patient is nervous/anxious. The patient does not have insomnia.      PHYSICAL EXAMINATION: ECOG PERFORMANCE STATUS: 1 - Symptomatic but completely ambulatory  Vitals:   06/30/20 1408  BP: 130/86  Pulse: 66  Resp: 20  Temp: (!) 97 F (36.1 C)  SpO2: 97%   Filed Weights   06/30/20 1408  Weight: 232 lb 5.8 oz (105.4 kg)    Physical Exam Constitutional:      General: Vital signs are normal.     Appearance: Normal appearance. He is obese.  HENT:     Head: Normocephalic and atraumatic.  Eyes:     Pupils: Pupils are equal, round, and reactive to light.  Cardiovascular:     Rate and Rhythm: Normal rate and regular rhythm.     Heart sounds: Normal heart sounds. No murmur heard.   Pulmonary:     Effort: Pulmonary effort is normal.     Breath sounds: Normal breath sounds. No wheezing.  Chest:  Breasts:     Right: No axillary adenopathy.     Left: Axillary adenopathy present.    Abdominal:     General: Bowel sounds are normal. There is no distension.     Palpations: Abdomen is soft.     Tenderness: There is no abdominal tenderness.  Musculoskeletal:        General: No edema. Normal range of motion.     Cervical back: Normal range of motion.  Lymphadenopathy:     Head:     Right side of head: No tonsillar adenopathy.     Left side of head: No tonsillar adenopathy.     Cervical: No cervical adenopathy.     Upper Body:     Right upper body: No axillary adenopathy.     Left upper body: Axillary adenopathy present.  Skin:    General: Skin is warm and dry.     Findings: No rash.  Neurological:     Mental Status: He is alert and oriented to person, place, and time.  Psychiatric:        Judgment: Judgment normal.     LABORATORY DATA:  I have reviewed the data as listed Recent Results (from the past 2160 hour(s))  CBC with Differential/Platelet     Status: Abnormal   Collection Time: 06/23/20  1:50 PM  Result Value Ref Range  WBC 5.7 4.0 - 10.5 K/uL   RBC  4.50 4.22 - 5.81 MIL/uL   Hemoglobin 12.6 (L) 13.0 - 17.0 g/dL   HCT 41.2 39.0 - 52.0 %   MCV 91.6 80.0 - 100.0 fL   MCH 28.0 26.0 - 34.0 pg   MCHC 30.6 30.0 - 36.0 g/dL   RDW 18.9 (H) 11.5 - 15.5 %   Platelets 214 150 - 400 K/uL   nRBC 0.0 0.0 - 0.2 %   Neutrophils Relative % 59 %   Neutro Abs 3.3 1.7 - 7.7 K/uL   Lymphocytes Relative 26 %   Lymphs Abs 1.5 0.7 - 4.0 K/uL   Monocytes Relative 10 %   Monocytes Absolute 0.6 0.1 - 1.0 K/uL   Eosinophils Relative 4 %   Eosinophils Absolute 0.3 0.0 - 0.5 K/uL   Basophils Relative 1 %   Basophils Absolute 0.1 0.0 - 0.1 K/uL   Immature Granulocytes 0 %   Abs Immature Granulocytes 0.01 0.00 - 0.07 K/uL    Comment: Performed at [facility], [address] [place], [place] [address]  Ferritin     Status: Abnormal   Collection Time: 06/23/20  1:50 PM  Result Value Ref Range   Ferritin 12 (L) 24 - 336 ng/mL    Comment: Performed at [facility], [address], [place], [place] [address]  Iron and TIBC     Status: Abnormal   Collection Time: 06/23/20  1:50 PM  Result Value Ref Range   Iron 173 45 - 182 ug/dL   TIBC 399 250 - 450 ug/dL   Saturation Ratios 43 (H) 17.9 - 39.5 %   UIBC 226 ug/dL    Comment: Performed at [facility], [address], [place], [place] [address]  Vitamin B12     Status: None   Collection Time: 06/23/20  1:50 PM  Result Value Ref Range   Vitamin B-12 684 180 - 914 pg/mL    Comment: (NOTE) This assay is not validated for testing neonatal or myeloproliferative syndrome specimens for Vitamin B12 levels. Performed at [facility], [address] [place], [place] [address]   Comprehensive metabolic panel     Status: Abnormal   Collection Time: 06/30/20  3:57 PM  Result Value Ref Range   Sodium 134 (L) 135 - 145 mmol/L   Potassium 3.7 3.5 - 5.1 mmol/L   Chloride 103 98 - 111 mmol/L   CO2 24 22 - 32 mmol/L   Glucose, Bld 89 70 - 99 mg/dL    Comment: Glucose reference range applies only to samples taken after  fasting for at least 8 hours.   BUN 19 6 - 20 mg/dL   Creatinine, Ser 0.70 0.61 - 1.24 mg/dL   Calcium 9.2 8.9 - 10.3 mg/dL   Total Protein 8.2 (H) 6.5 - 8.1 g/dL   Albumin 4.3 3.5 - 5.0 g/dL   AST 20 15 - 41 U/L   ALT 20 0 - 44 U/L   Alkaline Phosphatase 96 38 - 126 U/L   Total Bilirubin 0.4 0.3 - 1.2 mg/dL   GFR, Estimated >60 >60 mL/min    Comment: (NOTE) Calculated using the CKD-EPI Creatinine Equation (2021)    Anion gap 7 5 - 15    Comment: Performed at [facility], [address], [place], [place] [address]  HIV antibody (with reflex)     Status: None   Collection Time: 06/30/20  3:58 PM  Result Value Ref Range   HIV Screen 4th  Generation wRfx Non Reactive Non Reactive    Comment: Performed at [facility], [place] [address] [place], [place] [address]  Sedimentation rate     Status: Abnormal   Collection Time: 06/30/20  3:58 PM  Result Value Ref Range   Sed Rate 28 (H) 0 - 16 mm/hr    Comment: Performed at [facility], [address] [place], [place] [address]  C-reactive protein     Status: Abnormal   Collection Time: 06/30/20  3:58 PM  Result Value Ref Range   CRP 1.2 (H) <1.0 mg/dL    Comment: Performed at [facility], [address], [place], [place] [address]  ANA, IFA (with reflex)     Status: None   Collection Time: 06/30/20  3:58 PM  Result Value Ref Range   ANA Ab, IFA Negative     Comment: (NOTE)                                     Negative   <1:80                                     Borderline  1:80                                     Positive   >1:80 ICAP nomenclature: AC-0 For more information about Hep-2 cell patterns use ANApatterns.org, the official website for the International Consensus on Antinuclear Antibody (ANA) Patterns (ICAP). Performed At: [facility] [place], [place] [id number] Rush Farmer MD QX:[id number]   Rheumatoid factor     Status: None   Collection Time: 06/30/20  3:58 PM  Result Value Ref Range    Rhuematoid fact SerPl-aCnc <10.0 <14.0 IU/mL    Comment: (NOTE) Performed At: [facility] [place], [place] [id number] Rush Farmer MD AV:[id number]     RADIOGRAPHIC STUDIES: I have personally reviewed the radiological images as listed and agreed with the findings in the report. No results found.  ASSESSMENT & PLAN:   IDA: -Had EGD while hospitalized (03/25/20) that showed normal esophagus, 7 cm hiatal hernia, erythematous gastric mucosa at level of hiatus -Colonoscopy (03/26/20) with multiple polyps and due for surveillance in 5 years. -Capsule study unrevealing although distal small bowel was not adequately seen due to poor prep and debris. Unable to exclude occult lesions more distally -Received 5 doses of IV Venofer in December 2021. -Labs from 06/30/2020 show improvement.  Hemoglobin is 12.6, iron saturation 43% and ferritin 13. -He continues to have significant fatigue and weakness but denies bleeding.  -Denies NSAID use. -Given ferritin is low, he would benefit from additional IV Venofer but patient would prefer oral iron tablets. -Patient will continue oral iron supplements twice daily and we will recheck labs at his next visit.  Lymphadenopathy: -Unclear etiology-likely reactive. -He denies any recent infections. -Patient states symptoms started after hospitalization in November 2021. -Patient is concerned that he was given HIV with his blood transfusions given his symptoms started after his admission.  Offered reassurance. -Generalized lymphadenopathy work-up :  -Right axillary and cervical lymph nodes palpated on examination.  -No night sweats or weight loss but does have significant fatigue and change in appetite   -Per patient has  intermittent inguinal, axillary cervical and occipital lymphadenopathy.  Lymph nodes are occasionally tender.   -Denies any new medications.    -Denies infection.    -Denies cat scratch, undercooked meat, tick  bite or travel.  No high risk behavior.   -We will hold off on imaging at this time and collect additional lab work.  -Check for autoimmune disorders (ANA) and chronic inflammation ESR, CRP  -Rule out HIV.  -We will add on CLL FISH panel.  -Repeat CBC and CMP. -Given he is afebrile, will not initiate antibiotics but will start him on some steroids.  Prescription sent to his pharmacy.  Anxiety: -Patient is highly anxious. -Offered reassurance and told him I would call him once labs resulted.  Disposition: -Telephone visit on Thursday to discuss results.  He is aware that CLL results will take several weeks to come back. -Start steroid taper today. -Start oral iron tablets twice daily. -RTC in 4 weeks with repeat labs and to review results from CLL panel.  No problem-specific Assessment & Plan notes found for this encounter.  Greater than 50% was spent in counseling and coordination of care with this patient including but not limited to discussion of the relevant topics above (See A&P) including, but not limited to diagnosis and management of acute and chronic medical conditions.   Faythe Casa, NP 07/07/2020 2:20 PM

## 2020-07-01 ENCOUNTER — Other Ambulatory Visit (HOSPITAL_COMMUNITY): Payer: BC Managed Care – PPO

## 2020-07-01 LAB — ANTINUCLEAR ANTIBODIES, IFA: ANA Ab, IFA: NEGATIVE

## 2020-07-01 LAB — RHEUMATOID FACTOR: Rheumatoid fact SerPl-aCnc: <10 IU/mL (ref <14.0)

## 2020-07-02 ENCOUNTER — Other Ambulatory Visit: Payer: Self-pay

## 2020-07-02 ENCOUNTER — Inpatient Hospital Stay (HOSPITAL_BASED_OUTPATIENT_CLINIC_OR_DEPARTMENT_OTHER): Payer: BC Managed Care – PPO | Admitting: Oncology

## 2020-07-02 DIAGNOSIS — R591 Generalized enlarged lymph nodes: Secondary | ICD-10-CM

## 2020-07-02 DIAGNOSIS — D500 Iron deficiency anemia secondary to blood loss (chronic): Secondary | ICD-10-CM

## 2020-07-02 NOTE — Progress Notes (Signed)
Virtual Visit via Telephone Note  I connected with Stephen Roberts on 07/02/20 at 10:10 AM EST by telephone and verified that I am speaking with the correct person using two identifiers.  Location: Patient: Home Provider: Clinic    I discussed the limitations, risks, security and privacy concerns of performing an evaluation and management service by telephone and the availability of in person appointments. I also discussed with the patient that there may be a patient responsible charge related to this service. The patient expressed understanding and agreed to proceed.  History of Present Illness: Stephen Roberts is a 55 year old male with past medical history significant for type 2 diabetes, cervical radiculopathy, incarcerated umbilical hernia, obesity, tobacco use, chronic pain syndrome, hyperlipidemia and recently diagnosed with iron deficiency anemia.  He was hospitalized in November 2021 with melena and required 2 units of PRBC.  Work-up showed a hiatal hernia, multiple polyps and possible lesion but unable to visualize secondary to poor prep.  IDA thought to be secondary to blood loss from NSAID use.  He received 5 doses of IV Venofer last given on 05/13/2020.  He presented back to clinic on 06/30/2020 for follow-up for IDA and lab work.  Patient states that since his blood transfusions while hospitalized he has not felt well.  States he has swollen lymph nodes in his neck, armpits and groin.  They are painful and are intermittent.  He has no family history of blood disorders.  He is never been diagnosed with an autoimmune disorder.  He denied recent COVID-19 infection.  He has concerns that his blood transfusion gave him HIV.  Additional work-up included ESR, CRP, HIV, CBC, CMP and CLL work-up.  He is vaccinated against COVID-19 without a booster.  He was given a prescription for a steroid taper to see if that improved his lymphadenopathy.  Lab work for [facility] showed significant improvement.  His  hemoglobin is 12.6, iron saturation 43% with a ferritin of 12.    Today, he presents to review lab work.  He has not started his steroids.   Observations/Objective: Review of Systems  Constitutional: Positive for malaise/fatigue and weight loss. Negative for chills and fever.  HENT: Negative for congestion, ear pain and tinnitus.   Eyes: Negative.  Negative for blurred vision and double vision.  Respiratory: Positive for shortness of breath. Negative for cough and sputum production.   Cardiovascular: Negative.  Negative for chest pain, palpitations and leg swelling.  Gastrointestinal: Negative.  Negative for abdominal pain, constipation, diarrhea, nausea and vomiting.  Genitourinary: Negative for dysuria, frequency and urgency.  Musculoskeletal: Positive for neck pain. Negative for back pain and falls.  Skin: Negative.  Negative for rash.  Neurological: Positive for dizziness and weakness. Negative for headaches.  Endo/Heme/Allergies: Negative.  Does not bruise/bleed easily.  Psychiatric/Behavioral: Negative for depression. The patient is nervous/anxious. The patient does not have insomnia.    Physical Exam Constitutional:      Appearance: Normal appearance.  Neurological:     Mental Status: He is alert and oriented to person, place, and time.    Assessment and Plan:  IDA: -Received 5 doses of IV Venofer in December 2021. -Labs from 06/30/2020 show improvement.  Hemoglobin is 12.6, iron saturation 43% and ferritin 13. -He continues to have significant fatigue and weakness -Given ferritin is low, he would benefit from additional IV Venofer or could start on oral iron tablets. -Patient will continue oral iron supplements twice daily and we will recheck labs at his next visit.  Lymphadenopathy: -Unclear etiology. -He denies any recent infections. -Patient states symptoms started after hospitalization in November 2021. -So far work-up shows inflammation with elevation in CRP and  sedimentation rate. -ANA and RF were negative.  HIV nonreactive. -CLL labs are pending.   Follow Up Instructions: -Spoke to patient at length about lab work. -Patient to return to clinic on 07/22/2020 for follow-up of CLL and repeat iron labs. -He will trial oral iron tablets ferrous sulfate 325 mg twice daily until then.   I discussed the assessment and treatment plan with the patient. The patient was provided an opportunity to ask questions and all were answered. The patient agreed with the plan and demonstrated an understanding of the instructions.   The patient was advised to call back or seek an in-person evaluation if the symptoms worsen or if the condition fails to improve as anticipated.  I provided 20 minutes of non-face-to-face time during this encounter.   Jacquelin Hawking, NP

## 2020-07-03 ENCOUNTER — Ambulatory Visit: Payer: BC Managed Care – PPO | Admitting: Family Medicine

## 2020-07-09 LAB — FISH HES LEUKEMIA, 4Q12 REA

## 2020-07-17 ENCOUNTER — Encounter: Payer: Self-pay | Admitting: Family Medicine

## 2020-07-17 ENCOUNTER — Telehealth: Payer: Self-pay | Admitting: Oncology

## 2020-07-17 ENCOUNTER — Ambulatory Visit: Payer: BC Managed Care – PPO | Admitting: Family Medicine

## 2020-07-17 ENCOUNTER — Other Ambulatory Visit: Payer: Self-pay

## 2020-07-17 VITALS — BP 126/80 | HR 82 | Temp 98.5°F | Resp 14 | Ht 69.0 in | Wt 229.0 lb

## 2020-07-17 DIAGNOSIS — D500 Iron deficiency anemia secondary to blood loss (chronic): Secondary | ICD-10-CM | POA: Diagnosis not present

## 2020-07-17 DIAGNOSIS — E119 Type 2 diabetes mellitus without complications: Secondary | ICD-10-CM | POA: Diagnosis not present

## 2020-07-17 DIAGNOSIS — M5011 Cervical disc disorder with radiculopathy,  high cervical region: Secondary | ICD-10-CM | POA: Diagnosis not present

## 2020-07-17 DIAGNOSIS — G894 Chronic pain syndrome: Secondary | ICD-10-CM | POA: Diagnosis not present

## 2020-07-17 MED ORDER — FERROUS SULFATE 325 (65 FE) MG PO TBEC
325.0000 mg | DELAYED_RELEASE_TABLET | Freq: Two times a day (BID) | ORAL | 3 refills | Status: DC
Start: 2020-07-17 — End: 2021-08-30

## 2020-07-17 MED ORDER — OXYCODONE-ACETAMINOPHEN 5-325 MG PO TABS: 1.0000 | ORAL_TABLET | Freq: Three times a day (TID) | ORAL | 0 refills | Status: DC | PRN

## 2020-07-17 MED ORDER — PANTOPRAZOLE SODIUM 40 MG PO TBEC
40.0000 mg | DELAYED_RELEASE_TABLET | Freq: Every day | ORAL | 1 refills | Status: DC
Start: 2020-07-17 — End: 2021-09-01

## 2020-07-17 NOTE — Patient Instructions (Signed)
F/U 3 months Stephen Roberts

## 2020-07-17 NOTE — Telephone Encounter (Signed)
IW:LNLG  Stephen Roberts is a 55 year old male with past medical history significant for type 2 diabetes, cervical radiculopathy, incarcerated umbilical hernia, obesity, tobacco use, chronic pain syndrome, hyperlipidemia and recently diagnosed with iron deficiency anemia.  He was hospitalized in November 2021 with melena and required 2 units of PRBC.  Work-up showed a hiatal hernia, multiple polyps and possible lesion but unable to visualize secondary to poor prep.  IDA thought to be secondary to blood loss from NSAID use.  He received 5 doses of IV Venofer last given on 05/13/2020.  He presented back to clinic on 06/30/2020 for follow-up for IDA and lab work.  Patient states that since his blood transfusions while hospitalized he has not felt well.  States he has swollen lymph nodes in his neck, armpits and groin.  They are painful and are intermittent.  He has no family history of blood disorders.  He is never been diagnosed with an autoimmune disorder.  He denied recent COVID-19 infection.  He has concerns that his blood transfusion gave him HIV.  Additional work-up included ESR, CRP, HIV, CBC, CMP and CLL work-up.  He is vaccinated against COVID-19 without a booster.  He was given a prescription for a steroid taper to see if that improved his lymphadenopathy.  Lab work for [facility] showed significant improvement.  His hemoglobin is 12.6, iron saturation 43% with a ferritin of 12.    He wanted to START iron supplements instead of IV iron.   Lymphadenopathy work-up: CLL panel was negative. CRP was mildly elevated at 1.2. Sedimentation rate mildly elevated at 28.  ANA was negative.  HIV was negative.  Steroid taper did not help his swollen lymph nodes.   He is scheduled to see Dr. Delton Coombes next week on 07/22/2020 for follow-up for his iron deficiency.  We will speak with Dr. Delton Coombes to see if additional work-up is needed like biopsy or imaging given symptoms are not improving.  Faythe Casa,  NP 07/17/2020 1:46 PM

## 2020-07-17 NOTE — Progress Notes (Signed)
   Subjective:    Patient ID: Stephen Roberts, male    DOB: [birth year], 55 y.o.   MRN: [id number]  Patient presents for Follow-up (Anemia, fatigue)  Patient here to follow-up chronic medical problems.  Medications reviewed.  Diabetes mellitus her last visit he was expressing hypoglycemic episodes his A1c was normal at 5.6% I discontinued Metformin 500 mg once a day.  GERD continue on PPI.  He is also reducing use of NSAIDs secondary to his anemia and reflux.  Chronic pain he is referred to pain management however they would not accept him.  He has chronic neck pain.  History of multiple surgeries.  Previous neurosurgeon with Dr. Trenton Gammon  He is on oxycodone acetaminophen   Since our last visit he was seen by ENT for his vestibulitis and seen by oncology for intermittant Lymphadnopathy and his iron def  He is recently completed prednisone , per note he has not felt himself since his iron transfusion in December  He had labs drawn for autoimmune disorder and HIV at pt request and given prednisone  Labs were unremarkable with exception of minimal elevation in ESR/CRP  Review Of Systems:  GEN- + fatigue, fever,denies  weight loss,weakness, recent illness HEENT- denies eye drainage, change in vision, nasal discharge, CVS- denies chest pain, palpitations RESP- denies SOB, cough, wheeze ABD- denies N/V, change in stools, abd pain GU- denies dysuria, hematuria, dribbling, incontinence MSK- + joint pain, muscle aches, injury Neuro- denies headache, dizziness, syncope, seizure activity       Objective:    BP 126/80   Pulse 82   Temp 98.5 F (36.9 C) (Temporal)   Resp 14   Ht _0  (1.753 m)   Wt 229 lb (103.9 kg)   SpO2 97%   BMI 33.82 kg/m  GEN- NAD, alert and oriented x3 HEENT- PERRL, EOMI, non injected sclera, pink conjunctiva, MMM, oropharynx clear Neck- Supple, no thyromegaly decreased range of motion CVS- RRR, no murmur RESP-CTAB ABD-NABS,soft,NT,ND EXT- No edema Pulses-  Radial, DP- 2+       Assessment & Plan:      Problem List Items Addressed This Visit      Unprioritized   Cervical disc disorder with radiculopathy, high cervical region    Chronic pain, meds refilled       Chronic pain syndrome   Relevant Medications   oxyCODONE-acetaminophen (PERCOCET) 5-325 MG tablet   Iron deficiency anemia due to chronic blood loss    On oral supplement F/U oncology       Relevant Medications   ferrous sulfate 325 (65 FE) MG EC tablet   Type 2 diabetes mellitus (HCC) - Primary    Diet controlled, recheck A1C, off MTF      Relevant Orders   CBC with Differential/Platelet (Completed)   Comprehensive metabolic panel (Completed)   Hemoglobin A1c (Completed)      Note: This dictation was prepared with Dragon dictation along with smaller phrase technology. Any transcriptional errors that result from this process are unintentional.

## 2020-07-18 LAB — CBC WITH DIFFERENTIAL/PLATELET
Absolute Monocytes: 563 cells/uL (ref 200–950)
Basophils Absolute: 38 cells/uL (ref 0–200)
Basophils Relative: 0.6 %
Eosinophils Absolute: 179 cells/uL (ref 15–500)
Eosinophils Relative: 2.8 %
HCT: 40.9 % (ref 38.5–50.0)
Hemoglobin: 13.4 g/dL (ref 13.2–17.1)
Lymphs Abs: 1894 cells/uL (ref 850–3900)
MCH: 28.7 pg (ref 27.0–33.0)
MCHC: 32.8 g/dL (ref 32.0–36.0)
MCV: 87.6 fL (ref 80.0–100.0)
Monocytes Relative: 8.8 %
Neutro Abs: 3725 cells/uL (ref 1500–7800)
Neutrophils Relative %: 58.2 %
Platelets: 155 10*3/uL (ref 140–400)
RBC: 4.67 10*6/uL (ref 4.20–5.80)
RDW: 16.9 % — ABNORMAL HIGH (ref 11.0–15.0)
Total Lymphocyte: 29.6 %
WBC: 6.4 10*3/uL (ref 3.8–10.8)

## 2020-07-18 LAB — HEMOGLOBIN A1C
Hgb A1c MFr Bld: 6.4 % of total Hgb — ABNORMAL HIGH (ref <5.7)
Mean Plasma Glucose: 137 mg/dL
eAG (mmol/L): 7.6 mmol/L

## 2020-07-18 LAB — COMPREHENSIVE METABOLIC PANEL
AG Ratio: 1.5 (calc) (ref 1.0–2.5)
ALT: 25 U/L (ref 9–46)
AST: 19 U/L (ref 10–35)
Albumin: 4.2 g/dL (ref 3.6–5.1)
Alkaline phosphatase (APISO): 92 U/L (ref 35–144)
BUN/Creatinine Ratio: 22 (calc) (ref 6–22)
BUN: 15 mg/dL (ref 7–25)
CO2: 25 mmol/L (ref 20–32)
Calcium: 9.6 mg/dL (ref 8.6–10.3)
Chloride: 104 mmol/L (ref 98–110)
Creat: 0.67 mg/dL — ABNORMAL LOW (ref 0.70–1.33)
Globulin: 2.8 g/dL (calc) (ref 1.9–3.7)
Glucose, Bld: 81 mg/dL (ref 65–99)
Potassium: 4.2 mmol/L (ref 3.5–5.3)
Sodium: 139 mmol/L (ref 135–146)
Total Bilirubin: 0.2 mg/dL (ref 0.2–1.2)
Total Protein: 7 g/dL (ref 6.1–8.1)

## 2020-07-19 ENCOUNTER — Other Ambulatory Visit: Payer: Self-pay | Admitting: Family Medicine

## 2020-07-20 NOTE — Assessment & Plan Note (Signed)
Diet controlled, recheck A1C, off MTF

## 2020-07-20 NOTE — Assessment & Plan Note (Signed)
Chronic pain, meds refilled

## 2020-07-20 NOTE — Assessment & Plan Note (Signed)
On oral supplement F/U oncology

## 2020-07-22 ENCOUNTER — Inpatient Hospital Stay (HOSPITAL_COMMUNITY): Payer: BC Managed Care – PPO | Attending: Hematology | Admitting: Hematology

## 2020-07-22 ENCOUNTER — Other Ambulatory Visit: Payer: Self-pay

## 2020-07-22 ENCOUNTER — Inpatient Hospital Stay (HOSPITAL_COMMUNITY): Payer: BC Managed Care – PPO

## 2020-07-22 VITALS — BP 148/91 | HR 98 | Temp 99.5°F | Resp 17 | Wt 226.1 lb

## 2020-07-22 DIAGNOSIS — Z833 Family history of diabetes mellitus: Secondary | ICD-10-CM | POA: Insufficient documentation

## 2020-07-22 DIAGNOSIS — D500 Iron deficiency anemia secondary to blood loss (chronic): Secondary | ICD-10-CM | POA: Diagnosis not present

## 2020-07-22 DIAGNOSIS — Z822 Family history of deafness and hearing loss: Secondary | ICD-10-CM | POA: Insufficient documentation

## 2020-07-22 DIAGNOSIS — R591 Generalized enlarged lymph nodes: Secondary | ICD-10-CM | POA: Insufficient documentation

## 2020-07-22 DIAGNOSIS — Z8349 Family history of other endocrine, nutritional and metabolic diseases: Secondary | ICD-10-CM | POA: Insufficient documentation

## 2020-07-22 DIAGNOSIS — Z79899 Other long term (current) drug therapy: Secondary | ICD-10-CM | POA: Insufficient documentation

## 2020-07-22 DIAGNOSIS — Z818 Family history of other mental and behavioral disorders: Secondary | ICD-10-CM | POA: Diagnosis not present

## 2020-07-22 DIAGNOSIS — E119 Type 2 diabetes mellitus without complications: Secondary | ICD-10-CM | POA: Insufficient documentation

## 2020-07-22 DIAGNOSIS — Z823 Family history of stroke: Secondary | ICD-10-CM | POA: Insufficient documentation

## 2020-07-22 DIAGNOSIS — M542 Cervicalgia: Secondary | ICD-10-CM | POA: Insufficient documentation

## 2020-07-22 DIAGNOSIS — F17210 Nicotine dependence, cigarettes, uncomplicated: Secondary | ICD-10-CM | POA: Diagnosis not present

## 2020-07-22 DIAGNOSIS — R52 Pain, unspecified: Secondary | ICD-10-CM | POA: Insufficient documentation

## 2020-07-22 DIAGNOSIS — R5383 Other fatigue: Secondary | ICD-10-CM | POA: Diagnosis not present

## 2020-07-22 DIAGNOSIS — Z8249 Family history of ischemic heart disease and other diseases of the circulatory system: Secondary | ICD-10-CM | POA: Insufficient documentation

## 2020-07-22 DIAGNOSIS — Z800 Family history of malignant neoplasm of digestive organs: Secondary | ICD-10-CM | POA: Insufficient documentation

## 2020-07-22 DIAGNOSIS — D509 Iron deficiency anemia, unspecified: Secondary | ICD-10-CM | POA: Insufficient documentation

## 2020-07-22 LAB — CBC WITH DIFFERENTIAL/PLATELET
Basophils Absolute: 0 10*3/uL (ref 0.0–0.1)
Basophils Relative: 1 %
Eosinophils Absolute: 0.1 10*3/uL (ref 0.0–0.5)
Eosinophils Relative: 2 %
HCT: 46.2 % (ref 39.0–52.0)
Hemoglobin: 14.5 g/dL (ref 13.0–17.0)
Lymphocytes Relative: 25 %
Lymphs Abs: 1.7 10*3/uL (ref 0.7–4.0)
MCH: 29 pg (ref 26.0–34.0)
MCHC: 31.4 g/dL (ref 30.0–36.0)
MCV: 92.4 fL (ref 80.0–100.0)
Monocytes Absolute: 0.6 10*3/uL (ref 0.1–1.0)
Monocytes Relative: 8 %
Neutro Abs: 4.3 10*3/uL (ref 1.7–7.7)
Neutrophils Relative %: 64 %
Platelets: 166 10*3/uL (ref 150–400)
RBC: 5 MIL/uL (ref 4.22–5.81)
RDW: 18 % — ABNORMAL HIGH (ref 11.5–15.5)
WBC: 6.7 10*3/uL (ref 4.0–10.5)
nRBC: 0 % (ref 0.0–0.2)

## 2020-07-22 LAB — FERRITIN: Ferritin: 17 ng/mL — ABNORMAL LOW (ref 24–336)

## 2020-07-22 LAB — HEPATITIS PANEL, ACUTE
HCV Ab: NONREACTIVE
Hep A IgM: NONREACTIVE
Hep B C IgM: NONREACTIVE
Hepatitis B Surface Ag: NONREACTIVE

## 2020-07-22 LAB — IRON AND TIBC
Iron: 42 ug/dL — ABNORMAL LOW (ref 45–182)
Saturation Ratios: 11 % — ABNORMAL LOW (ref 17.9–39.5)
TIBC: 378 ug/dL (ref 250–450)
UIBC: 336 ug/dL

## 2020-07-22 LAB — LACTATE DEHYDROGENASE: LDH: 170 U/L (ref 98–192)

## 2020-07-22 NOTE — Progress Notes (Signed)
CONSULT NOTE  Patient Care Team: [facility], [place]ena Nunnery, MD as PCP - General (Family Medicine) Brien Mates, RN as Oncology Nurse Navigator (Oncology)  CHIEF COMPLAINTS/PURPOSE OF CONSULTATION:  Generalized pain, lymphadenopathy  HISTORY OF PRESENTING ILLNESS:  Stephen Roberts 55 y.o. male is here because of concern for lymphadenopathy over the past 3 months.  He has been previously seen at this clinic for iron deficiency anemia, but presents today for work-up of a new complaint  Patient was hospitalized in November 2021 due to GI bleed, and received 2 units of RBCs.  Patient reports that after his blood transfusion he began to notice left-sided neck swelling, with what he described as a grape-sized tender lymph node on the left side of his neck.  This resolved, but about 2 weeks later he began to notice "swelling and painful lumps" in his bilateral neck, armpits, groin, stomach, and leg.  He reports that these painful lumps come and go, and migrate from one location to another.  They are tender to palpation, and he states that he frequently checks various areas of his body for lumps and swelling throughout the day.  He reports that the pain is neither getting worse nor better, and that he is "starting to feel depressed, tired of hurting."  He reports that the pain is much less when he does not smoke, but that after smoking he notices more pain.  He also reports that he has more painful lumps when he is hungry, but they are improved after he eats a good meal.  Painful lumps are also decreased with a good night of sleep.    Patient also reports some generalized fatigue, but this is improving.  He has a good appetite.  Reports that he has unintentionally lost about 10 pounds in the past 2 months.  He denies any recent infections.  He did receive Covid vaccine x 2 in 2021, but has not received a booster.  No fever, chill, night sweats, nausea, vomiting, diarrhea, constipation, chest pain, shortness  of breath, cough, edema.  Does not have any pets at home, denies any scratches from animals.  No recent travel, tick bites.  He reports that he is monogamous with his wife.  Denies IV drug abuse, but does admit to taking cocaine last night, because he "just hurts so bad."  He was given a prednisone taper at his last appointment (06/30/20), but with no relief of symptoms.  Patient is very concerned and anxious about his symptoms.  He is worried that he "might have received a contaminated blood transfusion, or might have some cancer."  Of note, he points out that he has significant swelling and lymphadenopathy on the left side of his neck today, but during exam, no lymphadenopathy was palpated in this region.  MEDICAL HISTORY:  Past Medical History:  Diagnosis Date  . Arthritis   . Diabetes ([place])   . GERD (gastroesophageal reflux disease)   . Heart murmur   . Pneumonia 06/27/2006    SURGICAL HISTORY: Past Surgical History:  Procedure Laterality Date  . ANTERIOR FUSION CERVICAL SPINE    . COLONOSCOPY WITH PROPOFOL N/A 03/26/2020   Procedure: COLONOSCOPY WITH PROPOFOL;  Surgeon: Daneil Dolin, MD;  [facility];  Service: Endoscopy;  Laterality: N/A;  . disc in neck Left 05/2017  . ESOPHAGEAL DILATION N/A 03/25/2020   Procedure: ESOPHAGEAL DILATION;  Surgeon: Rogene Houston, MD;  [facility];  Service: Endoscopy;  Laterality: N/A;  . ESOPHAGOGASTRODUODENOSCOPY (  EGD) WITH PROPOFOL N/A 03/25/2020   Procedure: ESOPHAGOGASTRODUODENOSCOPY (EGD) WITH PROPOFOL;  Surgeon: Rogene Houston, MD;  [facility];  Service: Endoscopy;  Laterality: N/A;  . GIVENS CAPSULE STUDY N/A 04/13/2020   Procedure: GIVENS CAPSULE STUDY;  Surgeon: Daneil Dolin, MD;  [facility];  Service: Endoscopy;  Laterality: N/A;  7:30am  . INSERTION OF MESH N/A 01/23/2013   Procedure: INSERTION OF MESH;  Surgeon: Adin Hector, MD;  Location[facility];  Service: General;   Laterality: N/A;  . POLYPECTOMY  03/26/2020   Procedure: POLYPECTOMY;  Surgeon: Daneil Dolin, MD;  [facility];  Service: Endoscopy;;  . TONSILLECTOMY     as child  . VENTRAL HERNIA REPAIR N/A 01/23/2013   Procedure: LAPAROSCOPIC MULTIPLE VENTRAL HERNIAS;  Surgeon: Adin Hector, MD;  [facility];  Service: General;  Laterality: N/A;    SOCIAL HISTORY: Social History   Socioeconomic History  . Marital status: Married    Spouse name: Not on file  . Number of children: Not on file  . Years of education: Not on file  . Highest education level: Not on file  Occupational History  . Not on file  Tobacco Use  . Smoking status: Current Every Day Smoker    Packs/day: 0.50    Years: 42.00    Pack years: 21.00    Types: Cigarettes    Start date: 9  . Smokeless tobacco: Never Used  Substance and Sexual Activity  . Alcohol use: Yes    Comment: moderately  . Drug use: Not Currently    Types: Cocaine    Comment: states he has tried it all. Last used cocaine a few months ago.   Marland Kitchen Sexual activity: Yes  Other Topics Concern  . Not on file  Social History Narrative  . Not on file   Social Determinants of Health   Financial Resource Strain: Low Risk   . Difficulty of Paying Living Expenses: Not hard at all  Food Insecurity: No Food Insecurity  . Worried About Charity fundraiser in the Last Year: Never true  . Ran Out of Food in the Last Year: Never true  Transportation Needs: No Transportation Needs  . Lack of Transportation (Medical): No  . Lack of Transportation (Non-Medical): No  Physical Activity: Inactive  . Days of Exercise per Week: 0 days  . Minutes of Exercise per Session: 0 min  Stress: Stress Concern Present  . Feeling of Stress : Very much  Social Connections: Moderately Isolated  . Frequency of Communication with Friends and Family: More than three times a week  . Frequency of Social Gatherings with Friends and Family: More than three times  a week  . Attends Religious Services: Never  . Active Member of Clubs or Organizations: No  . Attends Archivist Meetings: Never  . Marital Status: Married  Human resources officer Violence: Not At Risk  . Fear of Current or Ex-Partner: No  . Emotionally Abused: No  . Physically Abused: No  . Sexually Abused: No    FAMILY HISTORY: Family History  Problem Relation Age of Onset  . Depression Mother   . Hearing loss Mother   . Hyperlipidemia Mother   . Hypertension Mother   . Stroke Mother   . Early death Father   . Diabetes Brother   . Pancreatic cancer Paternal Uncle   . Colon cancer Neg Hx     ALLERGIES:  is allergic to gabapentin  and lyrica [pregabalin].  MEDICATIONS:  Current Outpatient Medications  Medication Sig Dispense Refill  . albuterol (VENTOLIN HFA) 108 (90 Base) MCG/ACT inhaler INHALE 2 PUFFS INTO THE LUNGS EVERY 6 HOURS AS NEEDED FOR WHEEZING OR SHORTNESS OF BREATH 6.7 g 1  . docusate sodium (COLACE) 100 MG capsule Take 1 capsule (100 mg total) by mouth 2 (two) times daily. 60 capsule 2  . ferrous sulfate 325 (65 FE) MG EC tablet Take 1 tablet (325 mg total) by mouth 2 (two) times daily. 60 tablet 3  . Multiple Vitamin (MULTIVITAMIN) tablet Take 1 tablet by mouth daily.    . nicotine (NICODERM CQ) 21 mg/24hr patch Place 1 patch (21 mg total) onto the skin daily. 28 patch 0  . Omega-3 Fatty Acids (OMEGA-3 PO) Take by mouth.    . oxyCODONE-acetaminophen (PERCOCET) 5-325 MG tablet Take 1 tablet by mouth every 8 (eight) hours as needed for severe pain. 45 tablet 0  . pantoprazole (PROTONIX) 40 MG tablet Take 1 tablet (40 mg total) by mouth daily. 902 tablet 1  . TURMERIC PO Take by mouth.     No current facility-administered medications for this visit.   Review of Systems  Constitutional: Positive for malaise/fatigue and weight loss. Negative for chills, diaphoresis and fever.  Respiratory: Negative for cough, hemoptysis and shortness of breath.    Cardiovascular: Negative for chest pain and leg swelling.  Gastrointestinal: Negative for abdominal pain, nausea and vomiting.  Musculoskeletal: Positive for neck pain.  Skin: Negative for rash.  Endo/Heme/Allergies: Does not bruise/bleed easily.  Psychiatric/Behavioral: Positive for depression. The patient is nervous/anxious.    PHYSICAL EXAMINATION: ECOG PERFORMANCE STATUS: 1 - Symptomatic but completely ambulatory  Vitals:   07/22/20 1316  BP: (!) 148/91  Pulse: 98  Resp: 17  Temp: 99.5 F (37.5 C)  SpO2: 98%   Filed Weights   07/22/20 1316  Weight: 226 lb 2 oz (102.6 kg)    Physical Exam Constitutional:      Appearance: He is obese.  HENT:     Head: Normocephalic and atraumatic.     Nose: Nose normal.     Mouth/Throat:     Mouth: Mucous membranes are moist.     Pharynx: Oropharynx is clear. No oropharyngeal exudate or posterior oropharyngeal erythema.  Eyes:     Extraocular Movements: Extraocular movements intact.     Pupils: Pupils are equal, round, and reactive to light.  Cardiovascular:     Rate and Rhythm: Normal rate and regular rhythm.     Pulses: Normal pulses.     Heart sounds: Normal heart sounds.  Pulmonary:     Effort: Pulmonary effort is normal.     Breath sounds: Normal breath sounds.  Chest:  Breasts:     Right: Axillary adenopathy (Small subcentimeter lymphadenopathy, tender to palpation) present. No supraclavicular adenopathy.     Left: No axillary adenopathy or supraclavicular adenopathy.    Abdominal:     General: Bowel sounds are normal.     Palpations: Abdomen is soft.     Tenderness: There is no abdominal tenderness.  Musculoskeletal:        General: No swelling.     Right lower leg: No edema.     Left lower leg: No edema.     Comments: Multiple areas of point tenderness of groin, shoulders, left neck  Lymphadenopathy:     Head:     Right side of head: No submental, submandibular, preauricular, posterior auricular or occipital  adenopathy.  Left side of head: No submental, submandibular, preauricular, posterior auricular or occipital adenopathy.     Cervical: No cervical adenopathy.     Right cervical: No superficial, deep or posterior cervical adenopathy.    Left cervical: No superficial, deep or posterior cervical adenopathy.     Upper Body:     Right upper body: Axillary adenopathy (Small subcentimeter lymphadenopathy, tender to palpation) present. No supraclavicular adenopathy.     Left upper body: No supraclavicular or axillary adenopathy.     Lower Body: No right inguinal adenopathy. No left inguinal adenopathy.  Skin:    General: Skin is warm and dry.  Neurological:     Mental Status: He is alert.  Psychiatric:        Mood and Affect: Mood is anxious.     LABORATORY DATA:  I have reviewed the data as listed Recent Results (from the past 2160 hour(s))  CBC with Differential/Platelet     Status: Abnormal   Collection Time: 06/23/20  1:50 PM  Result Value Ref Range   WBC 5.7 4.0 - 10.5 K/uL   RBC 4.50 4.22 - 5.81 MIL/uL   Hemoglobin 12.6 (L) 13.0 - 17.0 g/dL   HCT 41.2 39.0 - 52.0 %   MCV 91.6 80.0 - 100.0 fL   MCH 28.0 26.0 - 34.0 pg   MCHC 30.6 30.0 - 36.0 g/dL   RDW 18.9 (H) 11.5 - 15.5 %   Platelets 214 150 - 400 K/uL   nRBC 0.0 0.0 - 0.2 %   Neutrophils Relative % 59 %   Neutro Abs 3.3 1.7 - 7.7 K/uL   Lymphocytes Relative 26 %   Lymphs Abs 1.5 0.7 - 4.0 K/uL   Monocytes Relative 10 %   Monocytes Absolute 0.6 0.1 - 1.0 K/uL   Eosinophils Relative 4 %   Eosinophils Absolute 0.3 0.0 - 0.5 K/uL   Basophils Relative 1 %   Basophils Absolute 0.1 0.0 - 0.1 K/uL   Immature Granulocytes 0 %   Abs Immature Granulocytes 0.01 0.00 - 0.07 K/uL    Comment: Performed at [facility], [address], [place], [place] [address]  Ferritin     Status: Abnormal   Collection Time: 06/23/20  1:50 PM  Result Value Ref Range   Ferritin 12 (L) 24 - 336 ng/mL    Comment: Performed at [facility], [address] [place], [place] [address]  Iron and TIBC     Status: Abnormal   Collection Time: 06/23/20  1:50 PM  Result Value Ref Range   Iron 173 45 - 182 ug/dL   TIBC 399 250 - 450 ug/dL   Saturation Ratios 43 (H) 17.9 - 39.5 %   UIBC 226 ug/dL    Comment: Performed at [facility], [address], [place], [place] [address]  Vitamin B12     Status: None   Collection Time: 06/23/20  1:50 PM  Result Value Ref Range   Vitamin B-12 684 180 - 914 pg/mL    Comment: (NOTE) This assay is not validated for testing neonatal or myeloproliferative syndrome specimens for Vitamin B12 levels. Performed at [facility], [address], [place], [place] [address]   Comprehensive metabolic panel     Status: Abnormal   Collection Time: 06/30/20  3:57 PM  Result Value Ref Range   Sodium 134 (L) 135 - 145 mmol/L   Potassium 3.7 3.5 - 5.1 mmol/L   Chloride 103 98 - 111 mmol/L   CO2 24 22 -  32 mmol/L   Glucose, Bld 89 70 - 99 mg/dL    Comment: Glucose reference range applies only to samples taken after fasting for at least 8 hours.   BUN 19 6 - 20 mg/dL   Creatinine, Ser 0.70 0.61 - 1.24 mg/dL   Calcium 9.2 8.9 - 10.3 mg/dL   Total Protein 8.2 (H) 6.5 - 8.1 g/dL   Albumin 4.3 3.5 - 5.0 g/dL   AST 20 15 - 41 U/L   ALT 20 0 - 44 U/L   Alkaline Phosphatase 96 38 - 126 U/L   Total Bilirubin 0.4 0.3 - 1.2 mg/dL   GFR, Estimated >60 >60 mL/min    Comment: (NOTE) Calculated using the CKD-EPI Creatinine Equation (2021)    Anion gap 7 5 - 15    Comment: Performed at [facility], [address], [place], [place] [address]  HIV antibody (with reflex)     Status: None   Collection Time: 06/30/20  3:58 PM  Result Value Ref Range   HIV Screen 4th Generation wRfx Non Reactive Non Reactive    Comment: Performed at [facility], [place] [address] [place], [place] [address]  Sedimentation rate     Status: Abnormal   Collection Time: 06/30/20  3:58 PM  Result Value Ref Range   Sed Rate 28 (H) 0 -  16 mm/hr    Comment: Performed at [facility], [address] [place], [place] [address]  Chronic Lymphocytic Leukemia (CLL) Profile, FISH     Status: None   Collection Time: 06/30/20  3:58 PM  Result Value Ref Range   Specimen Type Comment:     Comment: BLOOD   Cells Counted: Comment:     Comment: 100/PROBE   Cells Analyzed Comment:     Comment: 100/PROBE   FISH Result Comment:     Comment: NORMAL CLL PANEL   Interpretation: Comment:     Comment: (NOTE)    The CLL interphase fluorescence in situ hybridization (FISH) panel analysis was normal. There were no cells with CCND1-IGH fusion. No extra signals or deletions of ATM, chromosome 12, 13q, or TP53 were observed.    SPECIFIC FISH RESULTS:  CCND1/IGH: NORMAL   .      nuc ish 11q13(CCND1x2),14q32(IGHx2)[100]    ATM: NORMAL      nuc ish 11q22.3(ATMx2)[100] .  12cen: NORMAL     .      nuc ish 12cen(D12Z3x2)[100] .  13q: NORMAL  .      nuc ish 13q14.3(DLEUx2),13q34(TFDP1x2)[100] .  TP53: NORMAL  .      nuc ish 17p13.1(TP53x2)[100]        This analysis is limited to abnormalities detectable by the specific probes included in the study. FISH results should be interpreted within the context of a full cytogenetic analysis and hematologic evaluation.    REFERENCES:.  Malek,(2013) Adv Exp Med Biol [phone].GBMS#[id number]  .  Schnaiter et al.,(2011) Clin Lab Med [phone].AESL#[id number]         This test was developed and its performace characteristics determine d by [facility] It has not been cleared or approved by the U.S. Food and Drug Administration. The DNA probe vendor for this study was [employer], research (physical sciences)).    Director Review: Comment:     Comment: (NOTE) Laurie Panda, PHD Performed At: [facility] [address] [place], [place] [id number] Katina Degree MDPhD PO:[id number]   C-reactive protein     Status: Abnormal  Collection Time: 06/30/20  3:58 PM   Result Value Ref Range   CRP 1.2 (H) <1.0 mg/dL    Comment: Performed at [facility], [address], [place], [place] [address]  ANA, IFA (with reflex)     Status: None   Collection Time: 06/30/20  3:58 PM  Result Value Ref Range   ANA Ab, IFA Negative     Comment: (NOTE)                                     Negative   <1:80                                     Borderline  1:80                                     Positive   >1:80 ICAP nomenclature: AC-0 For more information about Hep-2 cell patterns use ANApatterns.org, the official website for the International Consensus on Antinuclear Antibody (ANA) Patterns (ICAP). Performed At: [facility] [place], [place] [id number] Rush Farmer MD OT:[id number]   Rheumatoid factor     Status: None   Collection Time: 06/30/20  3:58 PM  Result Value Ref Range   Rhuematoid fact SerPl-aCnc <10.0 <14.0 IU/mL    Comment: (NOTE) Performed At: [facility] [place], [place] [id number] Rush Farmer MD [id number]   CBC with Differential/Platelet     Status: Abnormal   Collection Time: 07/17/20 12:35 PM  Result Value Ref Range   WBC 6.4 3.8 - 10.8 Thousand/uL   RBC 4.67 4.20 - 5.80 Million/uL   Hemoglobin 13.4 13.2 - 17.1 g/dL   HCT 40.9 38.5 - 50.0 %   MCV 87.6 80.0 - 100.0 fL   MCH 28.7 27.0 - 33.0 pg   MCHC 32.8 32.0 - 36.0 g/dL   RDW 16.9 (H) 11.0 - 15.0 %   Platelets 155 140 - 400 Thousand/uL   MPV  7.5 - 12.5 fL    Comment: Due to platelet or RBC variability in size or shape the result cannot be reported accurately.    Neutro Abs 3,725 1,500 - 7,800 cells/uL   Lymphs Abs 1,894 850 - 3,900 cells/uL   Absolute Monocytes 563 200 - 950 cells/uL   Eosinophils Absolute 179 15 - 500 cells/uL   Basophils Absolute 38 0 - 200 cells/uL   Neutrophils Relative % 58.2 %   Total Lymphocyte 29.6 %   Monocytes Relative 8.8 %   Eosinophils Relative 2.8 %   Basophils Relative 0.6 %   Comprehensive metabolic panel     Status: Abnormal   Collection Time: 07/17/20 12:35 PM  Result Value Ref Range   Glucose, Bld 81 65 - 99 mg/dL    Comment: .            Fasting reference interval .    BUN 15 7 - 25 mg/dL   Creat 0.67 (L) 0.70 - 1.33 mg/dL    Comment: For patients >100 years of age, the reference limit for Creatinine is approximately 13% higher for people identified as African-American. .    BUN/Creatinine Ratio 22 6 - 22 (calc)   Sodium 139 135 - 146 mmol/L   Potassium 4.2 3.5 -  5.3 mmol/L   Chloride 104 98 - 110 mmol/L   CO2 25 20 - 32 mmol/L   Calcium 9.6 8.6 - 10.3 mg/dL   Total Protein 7.0 6.1 - 8.1 g/dL   Albumin 4.2 3.6 - 5.1 g/dL   Globulin 2.8 1.9 - 3.7 g/dL (calc)   AG Ratio 1.5 1.0 - 2.5 (calc)   Total Bilirubin 0.2 0.2 - 1.2 mg/dL   Alkaline phosphatase (APISO) 92 35 - 144 U/L   AST 19 10 - 35 U/L   ALT 25 9 - 46 U/L  Hemoglobin A1c     Status: Abnormal   Collection Time: 07/17/20 12:35 PM  Result Value Ref Range   Hgb A1c MFr Bld 6.4 (H) <5.7 % of total Hgb    Comment: For someone without known diabetes, a hemoglobin  A1c value between 5.7% and 6.4% is consistent with prediabetes and should be confirmed with a  follow-up test. . For someone with known diabetes, a value <7% indicates that their diabetes is well controlled. A1c targets should be individualized based on duration of diabetes, age, comorbid conditions, and other considerations. . This assay result is consistent with an increased risk of diabetes. . Currently, no consensus exists regarding use of hemoglobin A1c for diagnosis of diabetes for children. .    Mean Plasma Glucose 137 mg/dL   eAG (mmol/L) 7.6 mmol/L  CBC with Differential/Platelet     Status: Abnormal   Collection Time: 07/22/20 12:19 PM  Result Value Ref Range   WBC 6.7 4.0 - 10.5 K/uL   RBC 5.00 4.22 - 5.81 MIL/uL   Hemoglobin 14.5 13.0 - 17.0 g/dL   HCT 46.2 39.0 - 52.0 %   MCV 92.4 80.0 - 100.0 fL    MCH 29.0 26.0 - 34.0 pg   MCHC 31.4 30.0 - 36.0 g/dL   RDW 18.0 (H) 11.5 - 15.5 %   Platelets 166 150 - 400 K/uL   nRBC 0.0 0.0 - 0.2 %   Neutrophils Relative % 64 %   Neutro Abs 4.3 1.7 - 7.7 K/uL   Lymphocytes Relative 25 %   Lymphs Abs 1.7 0.7 - 4.0 K/uL   Monocytes Relative 8 %   Monocytes Absolute 0.6 0.1 - 1.0 K/uL   Eosinophils Relative 2 %   Eosinophils Absolute 0.1 0.0 - 0.5 K/uL   Basophils Relative 1 %   Basophils Absolute 0.0 0.0 - 0.1 K/uL    Comment: Performed at [facility], [address], [place], [place] [address]  Ferritin     Status: Abnormal   Collection Time: 07/22/20 12:19 PM  Result Value Ref Range   Ferritin 17 (L) 24 - 336 ng/mL    Comment: Performed at [facility], [address] [place], [place] [address]  Iron and TIBC     Status: Abnormal   Collection Time: 07/22/20 12:19 PM  Result Value Ref Range   Iron 42 (L) 45 - 182 ug/dL   TIBC 378 250 - 450 ug/dL   Saturation Ratios 11 (L) 17.9 - 39.5 %   UIBC 336 ug/dL    Comment: Performed at [facility], [address] [place], [place] [address]  Hepatitis panel, acute     Status: None   Collection Time: 07/22/20  2:09 PM  Result Value Ref Range   Hepatitis B Surface Ag NON REACTIVE NON REACTIVE   HCV Ab NON REACTIVE NON REACTIVE    Comment: (NOTE) Nonreactive HCV antibody screen is consistent with no HCV infections,  unless recent infection is suspected or other evidence exists to indicate HCV infection.     Hep A IgM NON REACTIVE NON REACTIVE   Hep B C IgM NON REACTIVE NON REACTIVE    Comment: Performed at [facility], [place] [address] [place], [place] [address]  Lactate dehydrogenase     Status: None   Collection Time: 07/22/20  2:09 PM  Result Value Ref Range   LDH 170 98 - 192 U/L    Comment: Performed at [facility], [address], [place], [place] [address]    RADIOGRAPHIC STUDIES: I have personally reviewed the radiological images as listed and agreed with the findings in the  report. No results found.  ASSESSMENT:  1. Generalized pain with intermittent lymphadenopathy -No significant lymphadenopathy felt on exam, apart from small subcentimeter right axillary lymph node which was tender to palpation -Patient has had fatigue since December, but this is improving; reports 10 pound weight loss in the past 2 months -Autoimmune screen (RF, ANA) negative; mildly elevated ESR 28, CRP 1.2; HIV nonreactive, CLL FISH negative -CBC today (07/22/2020) within normal limits with WBC 6.70, hemoglobin 14.5, platelets 166  2. Iron deficiency anemia secondary to occult GI bleed -In November 2021 due to melena and anemia, transfused RBC x2 -Was seen by GI, suspected occult small bowel bleed related to NSAID use, although no definite source of bleeding was found on EGD or colonoscopy -Received Venofer x 5 doses, has been on oral iron pills since that time -Hemoglobin within normal limits at 14.5 (07/22/2020) -Iron panel today (07/23/2019) shows serum iron 42, percent saturation, ferritin 17 -Would like to continue oral iron, despite low iron stores -likely having trouble absorbing iron due to long-term PPI use for GERD/gastritis    PLAN:  1. Generalized pain with intermittent lymphadenopathy -No significant lymphadenopathy felt on exam, but will obtain CT soft tissue of neck with contrast due to patient description of pain and lymph node swelling.  There is a small right axillary lymph node palpable, subcentimeter. -We will also obtain CT chest with contrast due to unintentional weight loss and smoking history -Check hepatitis panel, LDH, EBV, CMV -Patient reassured that symptoms will likely go away with time and may be related to underlying anxiety, will schedule for phone appointment in 2 weeks to discuss the results of the above testing  2. Iron deficiency anemia secondary to occult GI bleed -Hemoglobin within normal limits at 14.5 (07/22/2020) -Iron panel today (07/23/2019) shows serum  iron 42, percent saturation, ferritin 17 -Would like to continue oral iron, despite low iron stores -likely having trouble absorbing iron due to long-term PPI use for GERD/gastritis -We will recheck iron panel and CBC in 3 months, with follow-up appointment in clinic to discuss possible need for another iron transfusion    PLAN SUMMARY:  -Check hepatitis panel, LDH, EBV, CMV -Check CT with contrast of neck soft tissue -Check CT chest with contrast -Phone visit in 2 weeks to discuss results -Check iron panel, ferritin, CBC in 3 months -Follow-up in person in 3 months for iron deficiency anemia  All questions were answered. The patient knows to call the clinic with any problems, questions or concerns.  I, Tarri Abernethy PA-C, have seen this patient in conjunction with Dr. Derek Jack. Greater than 50% of visit was performed by Dr. Delton Coombes.    Derek Jack, MD 07/24/20 2:16 PM

## 2020-07-22 NOTE — Patient Instructions (Addendum)
[place] at [facility] Discharge Instructions  You were seen today by Dr. Delton Coombes and Tarri Abernethy PA-C. He went over your recent results. Additional labs have been ordered, as well as a CT of your chest and neck.  We will call you with results.   Repeat labs in 3 months for iron deficiency and return to clinic in 3 months for follow-up.  Please try to stop smoking.  Thank you for choosing [place] at [facility] to provide your oncology and hematology care.  To afford each patient quality time with our provider, please arrive at least 15 minutes before your scheduled appointment time.   If you have a lab appointment with the [place] please come in thru the Main Entrance and check in at the main information desk  You need to re-schedule your appointment should you arrive 10 or more minutes late.  We strive to give you quality time with our providers, and arriving late affects you and other patients whose appointments are after yours.  Also, if you no show three or more times for appointments you may be dismissed from the clinic at the providers discretion.     Again, thank you for choosing [facility].  Our hope is that these requests will decrease the amount of time that you wait before being seen by our physicians.       _____________________________________________________________  Should you have questions after your visit to [facility], please contact our office at [phone] between the hours of 8:00 a.m. and 4:30 p.m.  Voicemails left after 4:00 p.m. will not be returned until the following business day.  For prescription refill requests, have your pharmacy contact our office and allow 72 hours.    Cancer Center Support Programs:   > Cancer Support Group  2nd Tuesday of the month 1pm-2pm, Journey Room

## 2020-08-04 ENCOUNTER — Other Ambulatory Visit: Payer: Self-pay

## 2020-08-04 ENCOUNTER — Ambulatory Visit (HOSPITAL_COMMUNITY)
Admission: RE | Admit: 2020-08-04 | Discharge: 2020-08-04 | Disposition: A | Discharge status: Home / Self Care | Payer: BC Managed Care – PPO | Source: Ambulatory Visit | Attending: Physician Assistant | Admitting: Physician Assistant

## 2020-08-04 DIAGNOSIS — R590 Localized enlarged lymph nodes: Secondary | ICD-10-CM | POA: Diagnosis not present

## 2020-08-04 DIAGNOSIS — K449 Diaphragmatic hernia without obstruction or gangrene: Secondary | ICD-10-CM | POA: Diagnosis not present

## 2020-08-04 DIAGNOSIS — K118 Other diseases of salivary glands: Secondary | ICD-10-CM | POA: Diagnosis not present

## 2020-08-04 DIAGNOSIS — R591 Generalized enlarged lymph nodes: Secondary | ICD-10-CM | POA: Diagnosis not present

## 2020-08-04 DIAGNOSIS — M542 Cervicalgia: Secondary | ICD-10-CM | POA: Diagnosis not present

## 2020-08-04 DIAGNOSIS — I2510 Atherosclerotic heart disease of native coronary artery without angina pectoris: Secondary | ICD-10-CM | POA: Diagnosis not present

## 2020-08-04 DIAGNOSIS — J432 Centrilobular emphysema: Secondary | ICD-10-CM | POA: Diagnosis not present

## 2020-08-04 DIAGNOSIS — R634 Abnormal weight loss: Secondary | ICD-10-CM | POA: Diagnosis not present

## 2020-08-04 MED ORDER — IOHEXOL 300 MG/ML  SOLN
100.0000 mL | Freq: Once | INTRAMUSCULAR | Status: AC | PRN
  Administered 2020-08-04: 100 mL via INTRAVENOUS

## 2020-08-05 ENCOUNTER — Inpatient Hospital Stay (HOSPITAL_BASED_OUTPATIENT_CLINIC_OR_DEPARTMENT_OTHER): Payer: BC Managed Care – PPO | Admitting: Hematology

## 2020-08-05 ENCOUNTER — Other Ambulatory Visit (HOSPITAL_COMMUNITY): Payer: BC Managed Care – PPO

## 2020-08-05 VITALS — BP 111/69 | HR 78 | Temp 97.5°F | Resp 20 | Wt 231.8 lb

## 2020-08-05 DIAGNOSIS — Z8249 Family history of ischemic heart disease and other diseases of the circulatory system: Secondary | ICD-10-CM | POA: Diagnosis not present

## 2020-08-05 DIAGNOSIS — Z833 Family history of diabetes mellitus: Secondary | ICD-10-CM | POA: Diagnosis not present

## 2020-08-05 DIAGNOSIS — M542 Cervicalgia: Secondary | ICD-10-CM | POA: Diagnosis not present

## 2020-08-05 DIAGNOSIS — F17210 Nicotine dependence, cigarettes, uncomplicated: Secondary | ICD-10-CM | POA: Diagnosis not present

## 2020-08-05 DIAGNOSIS — D509 Iron deficiency anemia, unspecified: Secondary | ICD-10-CM | POA: Diagnosis not present

## 2020-08-05 DIAGNOSIS — R5383 Other fatigue: Secondary | ICD-10-CM | POA: Diagnosis not present

## 2020-08-05 DIAGNOSIS — K118 Other diseases of salivary glands: Secondary | ICD-10-CM | POA: Diagnosis not present

## 2020-08-05 DIAGNOSIS — R52 Pain, unspecified: Secondary | ICD-10-CM | POA: Diagnosis not present

## 2020-08-05 DIAGNOSIS — Z818 Family history of other mental and behavioral disorders: Secondary | ICD-10-CM | POA: Diagnosis not present

## 2020-08-05 DIAGNOSIS — Z822 Family history of deafness and hearing loss: Secondary | ICD-10-CM | POA: Diagnosis not present

## 2020-08-05 DIAGNOSIS — E119 Type 2 diabetes mellitus without complications: Secondary | ICD-10-CM | POA: Diagnosis not present

## 2020-08-05 DIAGNOSIS — Z800 Family history of malignant neoplasm of digestive organs: Secondary | ICD-10-CM | POA: Diagnosis not present

## 2020-08-05 DIAGNOSIS — Z823 Family history of stroke: Secondary | ICD-10-CM | POA: Diagnosis not present

## 2020-08-05 DIAGNOSIS — Z79899 Other long term (current) drug therapy: Secondary | ICD-10-CM | POA: Diagnosis not present

## 2020-08-05 DIAGNOSIS — Z8349 Family history of other endocrine, nutritional and metabolic diseases: Secondary | ICD-10-CM | POA: Diagnosis not present

## 2020-08-05 DIAGNOSIS — R591 Generalized enlarged lymph nodes: Secondary | ICD-10-CM | POA: Diagnosis not present

## 2020-08-05 NOTE — Patient Instructions (Signed)
[place] at [facility] Discharge Instructions  You were seen today by Dr. Delton Coombes and Tarri Abernethy PA-C for your parotid mass and lymph node swelling.    LABS: Return to the hospital lab for EBV and CMV tests.   OTHER TESTS: PET scan at [place]: Schedule for IV iron infusions next week  FOLLOW-UP APPOINTMENT:  - ENT appointment with Dr. Benjamine Mola for biopsy of parotid mass - Follow up with Dr. Delton Coombes in 4-6 weeks, after PET scan and ENT visit  Thank you for choosing [place] at [facility] to provide your oncology and hematology care.  To afford each patient quality time with our provider, please arrive at least 15 minutes before your scheduled appointment time.   If you have a lab appointment with the [place] please come in thru the Main Entrance and check in at the main information desk.  You need to re-schedule your appointment should you arrive 10 or more minutes late.  We strive to give you quality time with our providers, and arriving late affects you and other patients whose appointments are after yours.  Also, if you no show three or more times for appointments you may be dismissed from the clinic at the providers discretion.     Again, thank you for choosing [facility]  Our hope is that these requests will decrease the amount of time that you wait before being seen by our physicians.       _____________________________________________________________  Should you have questions after your visit to [facility], please contact our office at [phone] and follow the prompts.  Our office hours are 8:00 a.m. and 4:30 p.m. Monday - Friday.  Please note that voicemails left after 4:00 p.m. may not be returned until the following business day.  We are closed weekends and major holidays.  You do have access to a nurse 24-7, just call the main number to the clinic [phone]  and do not press any options, hold on the line and a nurse will answer the phone.    For prescription refill requests, have your pharmacy contact our office and allow 72 hours.    Due to Covid, you will need to wear a mask upon entering the hospital. If you do not have a mask, a mask will be given to you at the Main Entrance upon arrival. For doctor visits, patients may have 1 support person age 69 or older with them. For treatment visits, patients can not have anyone with them due to social distancing guidelines and our immunocompromised population.

## 2020-08-05 NOTE — Progress Notes (Signed)
[place], [place] [address]   CLINIC:  Medical Oncology/Hematology  PCP:  Alycia Rossetti, MD [address] [place] [id number] [phone]   REASON FOR VISIT:  Follow-up for lymphadenopathy and parotid mass  CURRENT THERAPY: Under work-up  INTERVAL HISTORY:  [name] 55 y.o. male returns for routine follow-up of lymphadenopathy and to discuss results of recent CT scan of neck and chest.  He was last seen a this office on 07/22/2020.  He continues to have intermittent lymph node swelling of his neck, mostly on the left side underneath his jaw, which waxes and wanes per his report.  This is associated with some discomfort when swallowing.  No dyspnea, wheezing, or stridor. He continues to have tenderness and pain in his axillary regions and groin, but without any definite "lump or bump." Axillary and groin simply feel "swollen with aching pain."  Reports that over the last 2 weeks he has begun to feel abdominal fullness and swelling, mostly on the right-hand side, which he says is "up under his ribs."  Reports intermittent nausea, but denies abdominal pain and vomiting.  Reports worsening fatigue, currently energy at about 25% of normal.  Appetite 75%, no weight loss.  Patient continues to report that the symptoms began after he received RBC transfusions in November 2021.   REVIEW OF SYSTEMS:  Review of Systems  Constitutional: Positive for fatigue. Negative for appetite change, chills, diaphoresis, fever and unexpected weight change.  HENT:   Positive for lump/mass. Negative for nosebleeds.   Eyes: Negative for eye problems.  Respiratory: Negative for cough, hemoptysis and shortness of breath.   Cardiovascular: Negative for chest pain, leg swelling and palpitations.  Gastrointestinal: Positive for abdominal pain, constipation and nausea. Negative for blood in stool, diarrhea and vomiting.  Genitourinary: Negative for hematuria.    Skin: Negative.   Neurological: Positive for light-headedness. Negative for dizziness and headaches.  Hematological: Does not bruise/bleed easily.      PAST MEDICAL/SURGICAL HISTORY:  Past Medical History:  Diagnosis Date  . Arthritis   . Diabetes ([place])   . GERD (gastroesophageal reflux disease)   . Heart murmur   . Pneumonia 06/27/2006   Past Surgical History:  Procedure Laterality Date  . ANTERIOR FUSION CERVICAL SPINE    . COLONOSCOPY WITH PROPOFOL N/A 03/26/2020   Procedure: COLONOSCOPY WITH PROPOFOL;  Surgeon: Daneil Dolin, MD;  [facility];  Service: Endoscopy;  Laterality: N/A;  . disc in neck Left 05/2017  . ESOPHAGEAL DILATION N/A 03/25/2020   Procedure: ESOPHAGEAL DILATION;  Surgeon: Rogene Houston, MD;  Location: [facility];  Service: Endoscopy;  Laterality: N/A;  . ESOPHAGOGASTRODUODENOSCOPY (EGD) WITH PROPOFOL N/A 03/25/2020   Procedure: ESOPHAGOGASTRODUODENOSCOPY (EGD) WITH PROPOFOL;  Surgeon: Rogene Houston, MD;  [facility];  Service: Endoscopy;  Laterality: N/A;  . GIVENS CAPSULE STUDY N/A 04/13/2020   Procedure: GIVENS CAPSULE STUDY;  Surgeon: Daneil Dolin, MD;  [facility]: [facility];  Service: Endoscopy;  Laterality: N/A;  7:30am  . INSERTION OF MESH N/A 01/23/2013   Procedure: INSERTION OF MESH;  Surgeon: Adin Hector, MD;  Location: [facility];  Service: General;  Laterality: N/A;  . POLYPECTOMY  03/26/2020   Procedure: POLYPECTOMY;  Surgeon: Daneil Dolin, MD;  Location: [facility];  Service: Endoscopy;;  . TONSILLECTOMY     as child  . VENTRAL HERNIA REPAIR N/A 01/23/2013   Procedure: LAPAROSCOPIC MULTIPLE  VENTRAL HERNIAS;  Surgeon: Adin Hector, MD;  Location: [facility];  Service: General;  Laterality: N/A;     SOCIAL HISTORY:  Social History   Socioeconomic History  . Marital status: Married    Spouse name: Not on file  . Number of children: Not on file  . Years of education: Not on file  . Highest  education level: Not on file  Occupational History  . Not on file  Tobacco Use  . Smoking status: Current Every Day Smoker    Packs/day: 0.50    Years: 42.00    Pack years: 21.00    Types: Cigarettes    Start date: 85  . Smokeless tobacco: Never Used  Substance and Sexual Activity  . Alcohol use: Yes    Comment: moderately  . Drug use: Not Currently    Types: Cocaine    Comment: states he has tried it all. Last used cocaine a few months ago.   Marland Kitchen Sexual activity: Yes  Other Topics Concern  . Not on file  Social History Narrative  . Not on file   Social Determinants of Health   Financial Resource Strain: Low Risk   . Difficulty of Paying Living Expenses: Not hard at all  Food Insecurity: No Food Insecurity  . Worried About Charity fundraiser in the Last Year: Never true  . Ran Out of Food in the Last Year: Never true  Transportation Needs: No Transportation Needs  . Lack of Transportation (Medical): No  . Lack of Transportation (Non-Medical): No  Physical Activity: Inactive  . Days of Exercise per Week: 0 days  . Minutes of Exercise per Session: 0 min  Stress: Stress Concern Present  . Feeling of Stress : Very much  Social Connections: Moderately Isolated  . Frequency of Communication with Friends and Family: More than three times a week  . Frequency of Social Gatherings with Friends and Family: More than three times a week  . Attends Religious Services: Never  . Active Member of Clubs or Organizations: No  . Attends Archivist Meetings: Never  . Marital Status: Married  Human resources officer Violence: Not At Risk  . Fear of Current or Ex-Partner: No  . Emotionally Abused: No  . Physically Abused: No  . Sexually Abused: No    FAMILY HISTORY:  Family History  Problem Relation Age of Onset  . Depression Mother   . Hearing loss Mother   . Hyperlipidemia Mother   . Hypertension Mother   . Stroke Mother   . Early death Father   . Diabetes Brother   .  Pancreatic cancer Paternal Uncle   . Colon cancer Neg Hx     CURRENT MEDICATIONS:  Outpatient Encounter Medications as of 08/05/2020  Medication Sig  . docusate sodium (COLACE) 100 MG capsule Take 1 capsule (100 mg total) by mouth 2 (two) times daily.  . ferrous sulfate 325 (65 FE) MG EC tablet Take 1 tablet (325 mg total) by mouth 2 (two) times daily.  Marland Kitchen levocetirizine (XYZAL) 5 MG tablet Take 5 mg by mouth daily.  . Multiple Vitamin (MULTIVITAMIN) tablet Take 1 tablet by mouth daily.  . nicotine (NICODERM CQ) 21 mg/24hr patch Place 1 patch (21 mg total) onto the skin daily.  . Omega-3 Fatty Acids (OMEGA-3 PO) Take by mouth.  . pantoprazole (PROTONIX) 40 MG tablet Take 1 tablet (40 mg total) by mouth daily.  . TURMERIC PO Take by mouth.  Marland Kitchen albuterol (VENTOLIN HFA) 108 (90  Base) MCG/ACT inhaler INHALE 2 PUFFS INTO THE LUNGS EVERY 6 HOURS AS NEEDED FOR WHEEZING OR SHORTNESS OF BREATH (Patient not taking: Reported on 08/05/2020)  . oxyCODONE-acetaminophen (PERCOCET) 5-325 MG tablet Take 1 tablet by mouth every 8 (eight) hours as needed for severe pain. (Patient not taking: Reported on 08/05/2020)   No facility-administered encounter medications on file as of 08/05/2020.    ALLERGIES:  Allergies  Allergen Reactions  . Gabapentin Shortness Of Breath  . Lyrica [Pregabalin] Shortness Of Breath     PHYSICAL EXAM:  ECOG PERFORMANCE STATUS: 1 - Symptomatic but completely ambulatory  Vitals:   08/05/20 1400  BP: 111/69  Pulse: 78  Resp: 20  Temp: (!) 97.5 F (36.4 C)  SpO2: 98%   Filed Weights   08/05/20 1400  Weight: 231 lb 12.8 oz (105.1 kg)   Physical Exam Constitutional:      Appearance: Normal appearance. He is obese.  HENT:     Head: Normocephalic and atraumatic.     Mouth/Throat:     Mouth: Mucous membranes are moist.     Pharynx: Oropharynx is clear.  Eyes:     Extraocular Movements: Extraocular movements intact.     Pupils: Pupils are equal, round, and reactive to  light.  Cardiovascular:     Rate and Rhythm: Normal rate and regular rhythm.     Pulses: Normal pulses.     Heart sounds: Normal heart sounds.  Pulmonary:     Effort: Pulmonary effort is normal.     Breath sounds: Normal breath sounds.  Chest:  Breasts:     Right: No axillary adenopathy or supraclavicular adenopathy.     Left: No axillary adenopathy or supraclavicular adenopathy.    Abdominal:     General: Bowel sounds are normal. There is no distension.     Palpations: Abdomen is soft. There is no hepatomegaly, splenomegaly or mass.     Tenderness: There is abdominal tenderness (Tender to palpation in groin, RUQ, LUG).  Musculoskeletal:        General: No swelling.     Right lower leg: No edema.     Left lower leg: No edema.  Lymphadenopathy:     Head:     Right side of head: No submental, submandibular, tonsillar, preauricular, posterior auricular or occipital adenopathy.     Left side of head: Submandibular adenopathy present. No submental, tonsillar, preauricular, posterior auricular or occipital adenopathy.     Cervical: No cervical adenopathy.     Right cervical: No superficial, deep or posterior cervical adenopathy.    Left cervical: No superficial, deep or posterior cervical adenopathy.     Upper Body:     Right upper body: No supraclavicular or axillary adenopathy.     Left upper body: No supraclavicular or axillary adenopathy.     Lower Body: No right inguinal adenopathy. No left inguinal adenopathy.  Skin:    General: Skin is warm and dry.  Neurological:     General: No focal deficit present.     Mental Status: He is alert and oriented to person, place, and time.  Psychiatric:        Mood and Affect: Mood normal.        Behavior: Behavior normal.      LABORATORY DATA:  I have reviewed the labs as listed.  CBC    Component Value Date/Time   WBC 6.7 07/22/2020 1219   RBC 5.00 07/22/2020 1219   HGB 14.5 07/22/2020 1219   HCT 46.2 07/22/2020  1219   PLT 166  07/22/2020 1219   MCV 92.4 07/22/2020 1219   MCH 29.0 07/22/2020 1219   MCHC 31.4 07/22/2020 1219   RDW 18.0 (H) 07/22/2020 1219   LYMPHSABS 1.7 07/22/2020 1219   MONOABS 0.6 07/22/2020 1219   EOSABS 0.1 07/22/2020 1219   BASOSABS 0.0 07/22/2020 1219   CMP Latest Ref Rng & Units 07/17/2020 06/30/2020 04/01/2020  Glucose 65 - 99 mg/dL 81 89 100(H)  BUN 7 - 25 mg/dL '15 19 12  ' Creatinine 0.70 - 1.33 mg/dL 0.67(L) 0.70 0.66(L)  Sodium 135 - 146 mmol/L 139 134(L) 136  Potassium 3.5 - 5.3 mmol/L 4.2 3.7 4.1  Chloride 98 - 110 mmol/L 104 103 101  CO2 20 - 32 mmol/L '25 24 26  ' Calcium 8.6 - 10.3 mg/dL 9.6 9.2 9.0  Total Protein 6.1 - 8.1 g/dL 7.0 8.2(H) -  Total Bilirubin 0.2 - 1.2 mg/dL 0.2 0.4 -  Alkaline Phos 38 - 126 U/L - 96 -  AST 10 - 35 U/L 19 20 -  ALT 9 - 46 U/L 25 20 -    DIAGNOSTIC IMAGING:  I have independently reviewed the relevant imaging and discussed with the patient.  ASSESSMENT: 1.  Left parotid mass and lymphadenopathy -Patient has had ongoing left-sided neck lymphadenopathy that waxes and wanes, also associated with axillary and inguinal tenderness but without discernible lymphadenopathy -Persistent fatigue since December 2021, no significant weight loss or other B symptoms -Autoimmune screen (RF, ANA) negative; mildly elevated ESR 28, CRP 1.2; HIV nonreactive, CLL FISH negative; hepatitis panel negative, LDH within normal limits at 170 -Most recent CBC (07/22/2020) within normal limits -CT soft tissue neck (08/04/2020): 17 x 15 x 13 mm mass or lymph node in the inferior aspect of the left parotid gland as well as enlarged level 1 lymph node on the left measuring 15 x 14 x 14 mm; no other asymmetric or pathologically enlarged lymph nodes in neck, but several symmetric lymphadenopathy in neck as described in full radiology report -CT chest (08/04/2020) showed no evidence of thoracic adenopathy or primary malignancy within the chest  2.  Iron deficiency anemia secondary to  occult GI bleed and malabsorption -Patient was hospitalized in November 2021 due to melena and anemia, transfused RBC x2 -Was seen by GI, suspected occult small bowel bleed related to NSAID use, although no definite source of bleeding was found on EGD or colonoscopy -Hemoglobin within normal limits at 14.5 (07/22/2020) -Iron panel (07/23/2019) shows serum iron 42, percent saturation, ferritin 17 -Likely malabsorption of iron in the setting of concurrent PPI use  3.  Social history -Patient reports every day tobacco use (0.5 to 1 pack/day cigarettes), weekly alcohol use (4-5 beers per week); infrequent illicit drug use (cocaine) -Patient lives at home with his wife, is on disability   PLAN:  1.  Left parotid mass and lymphadenopathy -Check EBV and CMV -PET scan -Referral to Dr. Benjamine Mola (ENT) for biopsy of parotid mass -Follow-up with Dr. Delton Coombes in 4 to 6 weeks to discuss results and next steps  2.  Iron deficiency anemia secondary to occult GI bleed -We will recheck iron panel and CBC in 3 months, with follow-up appointment in clinic to discuss possible need for another iron transfusion -Patient agreeable to IV iron, will schedule for Venofer x5 doses    PLAN SUMMARY & DISPOSITION: -Check EBV and CMV -PET scan -Referral to Dr. Benjamine Mola (ENT) for biopsy of parotid/lymph node mass -Schedule IV Venofer x5 -Follow-up with Dr. Delton Coombes  in 4 to 6 weeks to discuss results and next steps -Check iron labs in 3 months, follow-up at that time for ongoing treatment of iron deficiency and anemia  All questions were answered. The patient knows to call the clinic with any problems, questions or concerns.  Medical decision making: Moderate (1 undiagnosed new problem with uncertain prognosis, review of prior results x1, ordering new test x1)  Time spent on visit: I spent 30 minutes counseling the patient face to face. The total time spent in the appointment was 40 minutes and more than 50% was on  counseling.  I, Tarri Abernethy PA-C, have seen this patient in conjunction with Dr. Derek Jack.  Greater than 50% of visit was performed by Dr. Delton Coombes. I have independently evaluated this patient and reviewed scans and agree with the note written by my PA Rebekah Pennington PA-C.  Derek Jack, MD  08/05/20 5:41 PM

## 2020-08-06 ENCOUNTER — Other Ambulatory Visit (HOSPITAL_COMMUNITY): Payer: Self-pay | Admitting: Physician Assistant

## 2020-08-06 ENCOUNTER — Inpatient Hospital Stay (HOSPITAL_COMMUNITY): Payer: BC Managed Care – PPO

## 2020-08-06 ENCOUNTER — Other Ambulatory Visit: Payer: Self-pay

## 2020-08-06 DIAGNOSIS — R591 Generalized enlarged lymph nodes: Secondary | ICD-10-CM | POA: Diagnosis not present

## 2020-08-06 DIAGNOSIS — D509 Iron deficiency anemia, unspecified: Secondary | ICD-10-CM | POA: Diagnosis not present

## 2020-08-06 DIAGNOSIS — R5383 Other fatigue: Secondary | ICD-10-CM | POA: Diagnosis not present

## 2020-08-06 DIAGNOSIS — R52 Pain, unspecified: Secondary | ICD-10-CM | POA: Diagnosis not present

## 2020-08-06 DIAGNOSIS — Z822 Family history of deafness and hearing loss: Secondary | ICD-10-CM | POA: Diagnosis not present

## 2020-08-06 DIAGNOSIS — Z800 Family history of malignant neoplasm of digestive organs: Secondary | ICD-10-CM | POA: Diagnosis not present

## 2020-08-06 DIAGNOSIS — Z79899 Other long term (current) drug therapy: Secondary | ICD-10-CM | POA: Diagnosis not present

## 2020-08-06 DIAGNOSIS — M542 Cervicalgia: Secondary | ICD-10-CM | POA: Diagnosis not present

## 2020-08-06 DIAGNOSIS — D500 Iron deficiency anemia secondary to blood loss (chronic): Secondary | ICD-10-CM

## 2020-08-06 DIAGNOSIS — Z823 Family history of stroke: Secondary | ICD-10-CM | POA: Diagnosis not present

## 2020-08-06 DIAGNOSIS — Z8249 Family history of ischemic heart disease and other diseases of the circulatory system: Secondary | ICD-10-CM | POA: Diagnosis not present

## 2020-08-06 DIAGNOSIS — Z818 Family history of other mental and behavioral disorders: Secondary | ICD-10-CM | POA: Diagnosis not present

## 2020-08-06 DIAGNOSIS — F17210 Nicotine dependence, cigarettes, uncomplicated: Secondary | ICD-10-CM | POA: Diagnosis not present

## 2020-08-06 DIAGNOSIS — E119 Type 2 diabetes mellitus without complications: Secondary | ICD-10-CM | POA: Diagnosis not present

## 2020-08-06 DIAGNOSIS — Z833 Family history of diabetes mellitus: Secondary | ICD-10-CM | POA: Diagnosis not present

## 2020-08-06 DIAGNOSIS — Z8349 Family history of other endocrine, nutritional and metabolic diseases: Secondary | ICD-10-CM | POA: Diagnosis not present

## 2020-08-06 LAB — CBC WITH DIFFERENTIAL/PLATELET
Abs Immature Granulocytes: 0.02 10*3/uL (ref 0.00–0.07)
Basophils Absolute: 0.1 10*3/uL (ref 0.0–0.1)
Basophils Relative: 1 %
Eosinophils Absolute: 0.2 10*3/uL (ref 0.0–0.5)
Eosinophils Relative: 2 %
HCT: 43.4 % (ref 39.0–52.0)
Hemoglobin: 13.7 g/dL (ref 13.0–17.0)
Immature Granulocytes: 0 %
Lymphocytes Relative: 30 %
Lymphs Abs: 2.2 10*3/uL (ref 0.7–4.0)
MCH: 29.1 pg (ref 26.0–34.0)
MCHC: 31.6 g/dL (ref 30.0–36.0)
MCV: 92.3 fL (ref 80.0–100.0)
Monocytes Absolute: 0.6 10*3/uL (ref 0.1–1.0)
Monocytes Relative: 8 %
Neutro Abs: 4.3 10*3/uL (ref 1.7–7.7)
Neutrophils Relative %: 59 %
Platelets: 151 10*3/uL (ref 150–400)
RBC: 4.7 MIL/uL (ref 4.22–5.81)
RDW: 17.4 % — ABNORMAL HIGH (ref 11.5–15.5)
WBC: 7.2 10*3/uL (ref 4.0–10.5)
nRBC: 0 % (ref 0.0–0.2)

## 2020-08-06 LAB — LACTATE DEHYDROGENASE: LDH: 168 U/L (ref 98–192)

## 2020-08-07 ENCOUNTER — Other Ambulatory Visit (HOSPITAL_COMMUNITY): Payer: Self-pay | Admitting: Otolaryngology

## 2020-08-07 ENCOUNTER — Encounter (HOSPITAL_COMMUNITY): Payer: Self-pay

## 2020-08-07 ENCOUNTER — Inpatient Hospital Stay (HOSPITAL_COMMUNITY): Payer: BC Managed Care – PPO

## 2020-08-07 VITALS — BP 138/91 | HR 86 | Temp 97.3°F | Resp 18

## 2020-08-07 DIAGNOSIS — R5383 Other fatigue: Secondary | ICD-10-CM | POA: Diagnosis not present

## 2020-08-07 DIAGNOSIS — Z8249 Family history of ischemic heart disease and other diseases of the circulatory system: Secondary | ICD-10-CM | POA: Diagnosis not present

## 2020-08-07 DIAGNOSIS — R591 Generalized enlarged lymph nodes: Secondary | ICD-10-CM | POA: Diagnosis not present

## 2020-08-07 DIAGNOSIS — Z818 Family history of other mental and behavioral disorders: Secondary | ICD-10-CM | POA: Diagnosis not present

## 2020-08-07 DIAGNOSIS — M542 Cervicalgia: Secondary | ICD-10-CM | POA: Diagnosis not present

## 2020-08-07 DIAGNOSIS — D509 Iron deficiency anemia, unspecified: Secondary | ICD-10-CM | POA: Diagnosis not present

## 2020-08-07 DIAGNOSIS — Z800 Family history of malignant neoplasm of digestive organs: Secondary | ICD-10-CM | POA: Diagnosis not present

## 2020-08-07 DIAGNOSIS — Z833 Family history of diabetes mellitus: Secondary | ICD-10-CM | POA: Diagnosis not present

## 2020-08-07 DIAGNOSIS — D500 Iron deficiency anemia secondary to blood loss (chronic): Secondary | ICD-10-CM

## 2020-08-07 DIAGNOSIS — F17210 Nicotine dependence, cigarettes, uncomplicated: Secondary | ICD-10-CM | POA: Diagnosis not present

## 2020-08-07 DIAGNOSIS — E119 Type 2 diabetes mellitus without complications: Secondary | ICD-10-CM | POA: Diagnosis not present

## 2020-08-07 DIAGNOSIS — R590 Localized enlarged lymph nodes: Secondary | ICD-10-CM | POA: Diagnosis not present

## 2020-08-07 DIAGNOSIS — Z79899 Other long term (current) drug therapy: Secondary | ICD-10-CM | POA: Diagnosis not present

## 2020-08-07 DIAGNOSIS — R52 Pain, unspecified: Secondary | ICD-10-CM | POA: Diagnosis not present

## 2020-08-07 DIAGNOSIS — Z822 Family history of deafness and hearing loss: Secondary | ICD-10-CM | POA: Diagnosis not present

## 2020-08-07 DIAGNOSIS — Z8349 Family history of other endocrine, nutritional and metabolic diseases: Secondary | ICD-10-CM | POA: Diagnosis not present

## 2020-08-07 DIAGNOSIS — K118 Other diseases of salivary glands: Secondary | ICD-10-CM

## 2020-08-07 DIAGNOSIS — Z823 Family history of stroke: Secondary | ICD-10-CM | POA: Diagnosis not present

## 2020-08-07 DIAGNOSIS — D37030 Neoplasm of uncertain behavior of the parotid salivary glands: Secondary | ICD-10-CM | POA: Diagnosis not present

## 2020-08-07 LAB — CMV ANTIBODY, IGG (EIA): CMV Ab - IgG: <0.6 U/mL (ref 0.00–0.59)

## 2020-08-07 LAB — EPSTEIN-BARR VIRUS (EBV) ANTIBODY PROFILE
EBV NA IgG: 43.6 U/mL — ABNORMAL HIGH (ref 0.0–17.9)
EBV VCA IgG: 33.2 U/mL — ABNORMAL HIGH (ref 0.0–17.9)
EBV VCA IgM: <36 U/mL (ref 0.0–35.9)

## 2020-08-07 LAB — CMV IGM: CMV IgM: <30 AU/mL (ref 0.0–29.9)

## 2020-08-07 MED ORDER — SODIUM CHLORIDE 0.9 % IV SOLN: Freq: Once | INTRAVENOUS | Status: AC

## 2020-08-07 MED ORDER — SODIUM CHLORIDE 0.9 % IV SOLN
200.0000 mg | Freq: Once | INTRAVENOUS | Status: AC
  Administered 2020-08-07: 200 mg via INTRAVENOUS
  Filled 2020-08-07: qty 200

## 2020-08-07 NOTE — Progress Notes (Signed)
Tolerated infusion w/o adverse reaction.  Alert, in no distress.  VSS.  Discharged ambulatory in stable condition.  

## 2020-08-10 ENCOUNTER — Encounter (HOSPITAL_COMMUNITY): Payer: Self-pay

## 2020-08-10 ENCOUNTER — Inpatient Hospital Stay (HOSPITAL_COMMUNITY): Payer: BC Managed Care – PPO

## 2020-08-10 ENCOUNTER — Encounter (HOSPITAL_COMMUNITY): Payer: Self-pay | Admitting: Radiology

## 2020-08-10 VITALS — BP 132/80 | HR 58 | Temp 97.1°F | Resp 18

## 2020-08-10 DIAGNOSIS — R591 Generalized enlarged lymph nodes: Secondary | ICD-10-CM | POA: Diagnosis not present

## 2020-08-10 DIAGNOSIS — Z833 Family history of diabetes mellitus: Secondary | ICD-10-CM | POA: Diagnosis not present

## 2020-08-10 DIAGNOSIS — D500 Iron deficiency anemia secondary to blood loss (chronic): Secondary | ICD-10-CM

## 2020-08-10 DIAGNOSIS — D509 Iron deficiency anemia, unspecified: Secondary | ICD-10-CM | POA: Diagnosis not present

## 2020-08-10 DIAGNOSIS — M542 Cervicalgia: Secondary | ICD-10-CM | POA: Diagnosis not present

## 2020-08-10 DIAGNOSIS — Z8349 Family history of other endocrine, nutritional and metabolic diseases: Secondary | ICD-10-CM | POA: Diagnosis not present

## 2020-08-10 DIAGNOSIS — Z8249 Family history of ischemic heart disease and other diseases of the circulatory system: Secondary | ICD-10-CM | POA: Diagnosis not present

## 2020-08-10 DIAGNOSIS — R52 Pain, unspecified: Secondary | ICD-10-CM | POA: Diagnosis not present

## 2020-08-10 DIAGNOSIS — Z822 Family history of deafness and hearing loss: Secondary | ICD-10-CM | POA: Diagnosis not present

## 2020-08-10 DIAGNOSIS — Z79899 Other long term (current) drug therapy: Secondary | ICD-10-CM | POA: Diagnosis not present

## 2020-08-10 DIAGNOSIS — R5383 Other fatigue: Secondary | ICD-10-CM | POA: Diagnosis not present

## 2020-08-10 DIAGNOSIS — F17210 Nicotine dependence, cigarettes, uncomplicated: Secondary | ICD-10-CM | POA: Diagnosis not present

## 2020-08-10 DIAGNOSIS — Z800 Family history of malignant neoplasm of digestive organs: Secondary | ICD-10-CM | POA: Diagnosis not present

## 2020-08-10 DIAGNOSIS — Z823 Family history of stroke: Secondary | ICD-10-CM | POA: Diagnosis not present

## 2020-08-10 DIAGNOSIS — Z818 Family history of other mental and behavioral disorders: Secondary | ICD-10-CM | POA: Diagnosis not present

## 2020-08-10 DIAGNOSIS — E119 Type 2 diabetes mellitus without complications: Secondary | ICD-10-CM | POA: Diagnosis not present

## 2020-08-10 LAB — EPSTEIN BARR VRS(EBV DNA BY PCR): EBV DNA QN by PCR: NEGATIVE IU/mL

## 2020-08-10 MED ORDER — SODIUM CHLORIDE 0.9 % IV SOLN: Freq: Once | INTRAVENOUS | Status: AC

## 2020-08-10 MED ORDER — SODIUM CHLORIDE 0.9 % IV SOLN
200.0000 mg | Freq: Once | INTRAVENOUS | Status: AC
  Administered 2020-08-10: 200 mg via INTRAVENOUS
  Filled 2020-08-10: qty 200

## 2020-08-10 NOTE — Progress Notes (Signed)
Patient presents today for Venofer infusion. Vital signs stable. Patient denies any changes since his last visit. Patient denies any side effects from the Venofer infusion. MAR reviewed and updated.   Feraheme given today per MD orders. Tolerated infusion without adverse affects. Vital signs stable. No complaints at this time. Discharged from clinic ambulatory in stable condition. Alert and oriented x 3. F/U with [facility] as scheduled.

## 2020-08-10 NOTE — Progress Notes (Signed)
Stephen Lulas Lemoine GoyneDarrin" Male, 55 y.o., May 18, 1965  MRN:  [id number] Phone:  [phone] [name])       PCP:  Alycia Rossetti, MD Coverage:  Sherre Poot Blue Shield/[employer] Comm Ppo  Next Appt With Radiology ([facility] PET) 08/19/2020 at 7:00 AM           RE: [place] FNA SALIVARY GLAND/PAROTID GLAND Received: 2 days ago Markus Daft, MD  Arlyn Leak for US guided FNA of left parotid lesion.   [place]        Previous Messages   ----- Message -----  From: Garth Bigness D  Sent: 08/07/2020  6:21 PM EDT  To: Ir Procedure Requests  Subject: [place] FNA SALIVARY GLAND/PAROTID GLAND        Procedure: [place] FNA SALIVARY GLAND/PAROTID GLAND   Reason: Parotid Mass   History: CT in computer   Provider: Leta Baptist   Provider Contact: [phone]

## 2020-08-10 NOTE — Patient Instructions (Signed)
[facility] at [facility]  Discharge Instructions:  Venofer _______________________________________________________________  Thank you for choosing [facility] at [facility] to provide your oncology and hematology care.  To afford each patient quality time with our providers, please arrive at least 15 minutes before your scheduled appointment.  You need to re-schedule your appointment if you arrive 10 or more minutes late.  We strive to give you quality time with our providers, and arriving late affects you and other patients whose appointments are after yours.  Also, if you no show three or more times for appointments you may be dismissed from the clinic.  Again, thank you for choosing [facility] at [facility]. Our hope is that these requests will allow you access to exceptional care and in a timely manner. _______________________________________________________________  If you have questions after your visit, please contact our office at [phone] between the hours of 8:30 a.m. and 5:00 p.m. Voicemails left after 4:30 p.m. will not be returned until the following business day. _______________________________________________________________  For prescription refill requests, have your pharmacy contact our office. _______________________________________________________________  Recommendations made by the consultant and any test results will be sent to your referring physician. _______________________________________________________________ 

## 2020-08-11 LAB — CMV DNA BY PCR, QUALITATIVE: CMV DNA, Qual PCR: NEGATIVE

## 2020-08-13 ENCOUNTER — Ambulatory Visit (HOSPITAL_COMMUNITY): Payer: BC Managed Care – PPO

## 2020-08-13 ENCOUNTER — Other Ambulatory Visit: Payer: Self-pay | Admitting: Radiology

## 2020-08-13 MED ORDER — SODIUM CHLORIDE 0.9 % IV SOLN: Freq: Once | INTRAVENOUS | Status: AC

## 2020-08-13 MED ORDER — SODIUM CHLORIDE 0.9 % IV SOLN
200.0000 mg | Freq: Once | INTRAVENOUS | Status: AC
  Filled 2020-08-13: qty 10

## 2020-08-17 ENCOUNTER — Other Ambulatory Visit (HOSPITAL_COMMUNITY): Payer: Self-pay | Admitting: Otolaryngology

## 2020-08-17 ENCOUNTER — Other Ambulatory Visit: Payer: Self-pay

## 2020-08-17 ENCOUNTER — Ambulatory Visit (HOSPITAL_COMMUNITY)
Admission: RE | Admit: 2020-08-17 | Discharge: 2020-08-17 | Disposition: A | Discharge status: Home / Self Care | Payer: BC Managed Care – PPO | Source: Ambulatory Visit | Attending: Otolaryngology | Admitting: Otolaryngology

## 2020-08-17 DIAGNOSIS — K118 Other diseases of salivary glands: Secondary | ICD-10-CM

## 2020-08-17 DIAGNOSIS — D37030 Neoplasm of uncertain behavior of the parotid salivary glands: Secondary | ICD-10-CM | POA: Insufficient documentation

## 2020-08-17 DIAGNOSIS — R590 Localized enlarged lymph nodes: Secondary | ICD-10-CM | POA: Diagnosis not present

## 2020-08-17 MED ORDER — LIDOCAINE-EPINEPHRINE 1 %-1:100000 IJ SOLN
INTRAMUSCULAR | Status: AC
  Filled 2020-08-17: qty 1

## 2020-08-17 MED ORDER — SODIUM CHLORIDE 0.9 % IV SOLN: INTRAVENOUS | Status: DC

## 2020-08-17 NOTE — Consult Note (Signed)
Chief Complaint: Patient was seen in consultation today for US guided left parotid lesion biopsy  Referring Physician(s): Teoh,Su  Supervising Physician: Sandi Mariscal  Patient Status: [facility] - Out-pt  History of Present Illness: Stephen Roberts is a 55 y.o. male smoker with PMH DM,GERD, anemia and several month history of intermittent lymphadenopathy, more prominent along left neck/parotid region. Recent imaging has revealed:  . 17 x 15 x 13 mm mass or lymph node in the inferior aspect of the left parotid gland, immediately posterior to the retromandibular vein. Enlarged level 1 lymph node on the left measuring 15 x 14 x 14 mm. Therefore, this either represents a parotid mass lesion with an adjacent enlarged level 1 lymph node which could be metastatic or we are dealing with 2 enlarged lymph nodes, the parotid abnormality possibly representing an enlarged intraparotid node. The first scenario is favored. 2. Otherwise, there are no asymmetric or pathologically enlarged nodes on either side of the neck. There are symmetric level 2 lymph nodes without short axis dimension greater than 1 cm. There are symmetric level 3 and level 5 lymph nodes without short axis dimension greater than 8 mm. Small bilateral supraclavicular lymph nodes are seen without short axis dimension greater than 8 mm. 3. Previous disc arthroplasty at C3-4. Question some anterior motion of the device. Previous ACDF C5 through C7 with solid union. Degenerative facet arthropathy at C3-4, C4-5 and C7-T1.   He presents today for US guided left parotid lesion biopsy for further evaluation.     Past Medical History:  Diagnosis Date  . Arthritis   . Diabetes ([place])   . GERD (gastroesophageal reflux disease)   . Heart murmur   . Pneumonia 06/27/2006    Past Surgical History:  Procedure Laterality Date  . ANTERIOR FUSION CERVICAL SPINE    . COLONOSCOPY WITH PROPOFOL N/A 03/26/2020   Procedure: COLONOSCOPY  WITH PROPOFOL;  Surgeon: Daneil Dolin, MD;  Location: [facility];  Service: Endoscopy;  Laterality: N/A;  . disc in neck Left 05/2017  . ESOPHAGEAL DILATION N/A 03/25/2020   Procedure: ESOPHAGEAL DILATION;  Surgeon: Rogene Houston, MD;  Location: [facility];  Service: Endoscopy;  Laterality: N/A;  . ESOPHAGOGASTRODUODENOSCOPY (EGD) WITH PROPOFOL N/A 03/25/2020   Procedure: ESOPHAGOGASTRODUODENOSCOPY (EGD) WITH PROPOFOL;  Surgeon: Rogene Houston, MD;  Location: [facility];  Service: Endoscopy;  Laterality: N/A;  . GIVENS CAPSULE STUDY N/A 04/13/2020   Procedure: GIVENS CAPSULE STUDY;  Surgeon: Daneil Dolin, MD;  Location: [facility];  Service: Endoscopy;  Laterality: N/A;  7:30am  . INSERTION OF MESH N/A 01/23/2013   Procedure: INSERTION OF MESH;  Surgeon: Adin Hector, MD;  Location: [facility];  Service: General;  Laterality: N/A;  . POLYPECTOMY  03/26/2020   Procedure: POLYPECTOMY;  Surgeon: Daneil Dolin, MD;  Location: [facility];  Service: Endoscopy;;  . TONSILLECTOMY     as child  . VENTRAL HERNIA REPAIR N/A 01/23/2013   Procedure: LAPAROSCOPIC MULTIPLE VENTRAL HERNIAS;  Surgeon: Adin Hector, MD;  Location: [facility];  Service: General;  Laterality: N/A;    Allergies: Gabapentin and Lyrica [pregabalin]  Medications: Prior to Admission medications   Medication Sig Start Date End Date Taking? Authorizing Provider  albuterol (VENTOLIN HFA) 108 (90 Base) MCG/ACT inhaler INHALE 2 PUFFS INTO THE LUNGS EVERY 6 HOURS AS NEEDED FOR WHEEZING OR SHORTNESS OF BREATH Patient taking differently: Inhale 2 puffs into the lungs every 6 (six) hours as needed for  wheezing or shortness of breath. 07/20/20  Yes [place], Modena Nunnery, MD  Multiple Vitamin (MULTIVITAMIN) tablet Take 1 tablet by mouth daily.   Yes [provider]  Omega-3 Fatty Acids (OMEGA-3 PO) Take 500 mg by mouth daily.   Yes [provider]  pantoprazole (PROTONIX) 40 MG tablet Take 1 tablet (40  mg total) by mouth daily. 07/17/20 07/17/21 Yes [place], Modena Nunnery, MD  Turmeric 500 MG CAPS Take 500 mg by mouth daily.   Yes [provider]  vitamin B-12 (CYANOCOBALAMIN) 1000 MCG tablet Take 1,000 mcg by mouth daily.   Yes [provider]  ferrous sulfate 325 (65 FE) MG EC tablet Take 1 tablet (325 mg total) by mouth 2 (two) times daily. Patient not taking: No sig reported 07/17/20 07/17/21  Alycia Rossetti, MD     Family History  Problem Relation Age of Onset  . Depression Mother   . Hearing loss Mother   . Hyperlipidemia Mother   . Hypertension Mother   . Stroke Mother   . Early death Father   . Diabetes Brother   . Pancreatic cancer Paternal Uncle   . Colon cancer Neg Hx     Social History   Socioeconomic History  . Marital status: Married    Spouse name: Not on file  . Number of children: Not on file  . Years of education: Not on file  . Highest education level: Not on file  Occupational History  . Not on file  Tobacco Use  . Smoking status: Current Every Day Smoker    Packs/day: 0.50    Years: 42.00    Pack years: 21.00    Types: Cigarettes    Start date: 9  . Smokeless tobacco: Never Used  Substance and Sexual Activity  . Alcohol use: Yes    Comment: moderately  . Drug use: Not Currently    Types: Cocaine    Comment: states he has tried it all. Last used cocaine a few months ago.   Marland Kitchen Sexual activity: Yes  Other Topics Concern  . Not on file  Social History Narrative  . Not on file   Social Determinants of Health   Financial Resource Strain: Low Risk   . Difficulty of Paying Living Expenses: Not hard at all  Food Insecurity: No Food Insecurity  . Worried About Charity fundraiser in the Last Year: Never true  . Ran Out of Food in the Last Year: Never true  Transportation Needs: No Transportation Needs  . Lack of Transportation (Medical): No  . Lack of Transportation (Non-Medical): No  Physical Activity: Inactive  . Days of Exercise  per Week: 0 days  . Minutes of Exercise per Session: 0 min  Stress: Stress Concern Present  . Feeling of Stress : Very much  Social Connections: Moderately Isolated  . Frequency of Communication with Friends and Family: More than three times a week  . Frequency of Social Gatherings with Friends and Family: More than three times a week  . Attends Religious Services: Never  . Active Member of Clubs or Organizations: No  . Attends Archivist Meetings: Never  . Marital Status: Married      Review of Systems denies high fever, N/V or visible bleeding. He does c/o occ HA/head cold, occ chest tightness, dyspnea with exertion, occ cough, abd /back pain Vital Signs: BP 118/90   Pulse 80   Temp 98.8 F (37.1 C) (Oral)   Ht 5\' 9"  (1.753 m)  Wt 230 lb (104.3 kg)   SpO2 99%   BMI 33.97 kg/m   Physical Exam awake/alert; chest- distant but clear BS bilat; heart- RRR; abd- soft,+BS,some mild tenderness left upper abd region; no LE edema; fullness noted left angle of jaw/upper neck region   Imaging: CT SOFT TISSUE NECK W CONTRAST  Result Date: 08/04/2020 CLINICAL DATA:  Left-sided neck swelling. Lymphadenopathy. Question developing bilateral nodes. EXAM: CT NECK WITH CONTRAST TECHNIQUE: Multidetector CT imaging of the neck was performed using the standard protocol following the bolus administration of intravenous contrast. CONTRAST:  162mL OMNIPAQUE IOHEXOL 300 MG/ML  SOLN COMPARISON:  None. FINDINGS: Pharynx and larynx: No mucosal or submucosal lesion is seen. Salivary glands: Submandibular glands are normal right parotid gland is normal. Within the inferior aspect of the left parotid gland, immediately posterior to the retromandibular vein, there is a 17 x 15 x 13 mm mass or lymph node. There is an enlarged level 1 lymph node on the left measuring 15 x 14 x 14 mm. Therefore, this either represents a parotid mass lesion with an adjacent enlarged level 1 lymph node which could be  metastatic or we are dealing with 2 enlarged lymph nodes, the parotid abnormality possibly representing an enlarged intraparotid node. The first scenario is favored. Thyroid: Normal Lymph nodes: Otherwise, there are no asymmetric or pathologically enlarged nodes on either side of the neck. There are symmetric level 2 lymph nodes without short axis dimension larger than a cm. There are symmetric level 3 and level 5 lymph nodes without short axis dimension greater than 8 mm. Small bilateral supraclavicular lymph nodes are seen without short axis dimension greater than 8 mm. Vascular: No abnormal vascular finding. Limited intracranial: Normal Visualized orbits: Normal Mastoids and visualized paranasal sinuses: Clear Skeleton: Previous disc arthroplasty at C3-4. Question some anterior motion of the device. Previous ACDF C5 through C7 with solid union. Degenerative facet arthropathy at C3-4, C4-5 and C7-T1. Upper chest: Normal Other: None IMPRESSION: 1. 17 x 15 x 13 mm mass or lymph node in the inferior aspect of the left parotid gland, immediately posterior to the retromandibular vein. Enlarged level 1 lymph node on the left measuring 15 x 14 x 14 mm. Therefore, this either represents a parotid mass lesion with an adjacent enlarged level 1 lymph node which could be metastatic or we are dealing with 2 enlarged lymph nodes, the parotid abnormality possibly representing an enlarged intraparotid node. The first scenario is favored. 2. Otherwise, there are no asymmetric or pathologically enlarged nodes on either side of the neck. There are symmetric level 2 lymph nodes without short axis dimension greater than 1 cm. There are symmetric level 3 and level 5 lymph nodes without short axis dimension greater than 8 mm. Small bilateral supraclavicular lymph nodes are seen without short axis dimension greater than 8 mm. 3. Previous disc arthroplasty at C3-4. Question some anterior motion of the device. Previous ACDF C5 through C7  with solid union. Degenerative facet arthropathy at C3-4, C4-5 and C7-T1. Electronically Signed   By: Nelson Chimes M.D.   On: 08/04/2020 15:53   CT Chest W Contrast  Result Date: 08/05/2020 CLINICAL DATA:  Weight loss.  Lymphadenopathy.  Left neck swelling. EXAM: CT CHEST WITH CONTRAST TECHNIQUE: Multidetector CT imaging of the chest was performed during intravenous contrast administration. CONTRAST:  13mL OMNIPAQUE IOHEXOL 300 MG/ML  SOLN COMPARISON:  Today's neck CT, dictated separately. Chest radiograph 03/23/2020. remote chest CT of 07/03/2010 also reviewed. FINDINGS: Cardiovascular: Aortic atherosclerosis. Borderline  cardiomegaly, without pericardial effusion. Three-vessel coronary artery calcification. Mediastinum/Nodes: Small bilateral axillary nodes are similar, likely reactive. No mediastinal or hilar adenopathy. Moderate hiatal hernia. Lungs/Pleura: No pleural fluid.  Mild centrilobular emphysema. Mild right base scarring. Upper Abdomen: Normal imaged portions of the liver, spleen, pancreas, left adrenal gland, gallbladder, kidneys. Minimal right adrenal nodularity is unchanged. Musculoskeletal: Cervical spine fixation. IMPRESSION: 1. No evidence of thoracic adenopathy or primary malignancy within the chest. 2. Aortic atherosclerosis (ICD10-I70.0), coronary artery atherosclerosis and emphysema (ICD10-J43.9). 3. Moderate hiatal hernia. Electronically Signed   By: Abigail Miyamoto M.D.   On: 08/05/2020 10:27    Labs:  CBC: Recent Labs    06/23/20 1350 07/17/20 1235 07/22/20 1219 08/06/20 1515  WBC 5.7 6.4 6.7 7.2  HGB 12.6* 13.4 14.5 13.7  HCT 41.2 40.9 46.2 43.4  PLT 214 155 166 151    COAGS: Recent Labs    03/24/20 0609  INR 1.0  APTT 27    BMP: Recent Labs    03/23/20 2016 03/24/20 0609 04/01/20 1233 06/30/20 1557 07/17/20 1235  NA 131* 137 136 134* 139  K 3.6 4.3 4.1 3.7 4.2  CL 99 101 101 103 104  CO2 21* 26 26 24 25   GLUCOSE 186* 111* 100* 89 81  BUN 21* 15 12 19  15   CALCIUM 9.0 9.1 9.0 9.2 9.6  CREATININE 0.84 0.70 0.66* 0.70 0.67*  GFRNONAA >60 >60  --  >60  --     LIVER FUNCTION TESTS: Recent Labs    03/24/20 0609 06/30/20 1557 07/17/20 1235  BILITOT 0.5 0.4 0.2  AST 21 20 19   ALT 16 20 25   ALKPHOS 90 96  --   PROT 7.6 8.2* 7.0  ALBUMIN 3.7 4.3  --     TUMOR MARKERS: No results for input(s): AFPTM, CEA, CA199, CHROMGRNA in the last 8760 hours.  Assessment and Plan: 54 y.o. male smoker with PMH DM,GERD, anemia and several month history of intermittent lymphadenopathy, more prominent along left neck/parotid region. Recent imaging has revealed:  . 17 x 15 x 13 mm mass or lymph node in the inferior aspect of the left parotid gland, immediately posterior to the retromandibular vein. Enlarged level 1 lymph node on the left measuring 15 x 14 x 14 mm. Therefore, this either represents a parotid mass lesion with an adjacent enlarged level 1 lymph node which could be metastatic or we are dealing with 2 enlarged lymph nodes, the parotid abnormality possibly representing an enlarged intraparotid node. The first scenario is favored. 2. Otherwise, there are no asymmetric or pathologically enlarged nodes on either side of the neck. There are symmetric level 2 lymph nodes without short axis dimension greater than 1 cm. There are symmetric level 3 and level 5 lymph nodes without short axis dimension greater than 8 mm. Small bilateral supraclavicular lymph nodes are seen without short axis dimension greater than 8 mm. 3. Previous disc arthroplasty at C3-4. Question some anterior motion of the device. Previous ACDF C5 through C7 with solid union. Degenerative facet arthropathy at C3-4, C4-5 and C7-T1.   He presents today for US guided left parotid lesion biopsy for further evaluation.Risks and benefits of procedure was discussed with the patient  including, but not limited to bleeding, infection, damage to adjacent structures or low yield  requiring additional tests.  All of the questions were answered and there is agreement to proceed.  Consent signed and in chart.     Thank you for this interesting consult.  I  greatly enjoyed meeting Stephen Roberts and look forward to participating in their care.  A copy of this report was sent to the requesting provider on this date.  Electronically Signed: D. Rowe Robert, PA-C 08/17/2020, 12:04 PM   I spent a total of 25 minutes in face to face in clinical consultation, greater than 50% of which was counseling/coordinating care for US guided biopsy of left parotid lesion

## 2020-08-17 NOTE — Discharge Instructions (Addendum)
Needle Biopsy, Care After °These instructions tell you how to care for yourself after your procedure. Your doctor may also give you more specific instructions. Call your doctor if you have any problems or questions. °What can I expect after the procedure? °After the procedure, it is common to have: °· Soreness. °· Bruising. °· Mild pain. °Follow these instructions at home: °· Return to your normal activities as told by your doctor. Ask your doctor what activities are safe for you. °· Take over-the-counter and prescription medicines only as told by your doctor. °· Wash your hands with soap and water before you change your bandage (dressing). If you cannot use soap and water, use hand sanitizer. °· Follow instructions from your doctor about: °? How to take care of your puncture site. °? When and how to change your bandage. °? When to remove your bandage. °· Check your puncture site every day for signs of infection. Watch for: °? Redness, swelling, or pain. °? Fluid or blood. °? Pus or a bad smell. °? Warmth. °· Do not take baths, swim, or use a hot tub until your doctor approves. Ask your doctor if you may take showers. You may only be allowed to take sponge baths. °· Keep all follow-up visits as told by your doctor. This is important.   °Contact a doctor if you have: °· A fever. °· Redness, swelling, or pain at the puncture site, and it lasts longer than a few days. °· Fluid, blood, or pus coming from the puncture site. °· Warmth coming from the puncture site. °Get help right away if: °· You have a lot of bleeding from the puncture site. °Summary °· After the procedure, it is common to have soreness, bruising, or mild pain at the puncture site. °· Check your puncture site every day for signs of infection, such as redness, swelling, or pain. °· Get help right away if you have severe bleeding from your puncture site. °This information is not intended to replace advice given to you by your health care provider. Make  sure you discuss any questions you have with your health care provider. °Document Revised: 10/31/2019 Document Reviewed: 10/31/2019 °Elsevier Patient Education © 2021 [employer] °Moderate Conscious Sedation, Adult °Sedation is the use of medicines to promote relaxation and to relieve discomfort and anxiety. Moderate conscious sedation is a type of sedation. Under moderate conscious sedation, you are less alert than normal, but you are still able to respond to instructions, touch, or both. °Moderate conscious sedation is used during short medical and dental procedures. It is milder than deep sedation, which is a type of sedation under which you cannot be easily woken up. It is also milder than general anesthesia, which is the use of medicines to make you unconscious. Moderate conscious sedation allows you to return to your regular activities sooner. °Tell a health care provider about: °· Any allergies you have. °· All medicines you are taking, including vitamins, herbs, eye drops, creams, and over-the-counter medicines. °· Any use of steroids. This includes steroids taken by mouth or as a cream. °· Any problems you or family members have had with sedatives and anesthetic medicines. °· Any blood disorders you have. °· Any surgeries you have had. °· Any medical conditions you have, such as sleep apnea. °· Whether you are pregnant or may be pregnant. °· Any use of cigarettes, alcohol, marijuana, or drugs. °What are the risks? °Generally, this is a safe procedure. However, problems may occur, including: °· Getting too much   medicine (oversedation). °· Nausea. °· Allergic reaction to medicines. °· Trouble breathing. If this happens, a breathing tube may be used. It will be removed when you are awake and breathing on your own. °· Heart trouble. °· Lung trouble. °· Confusion that gets better with time (emergence delirium). °What happens before the procedure? °Staying hydrated °Follow instructions from your health care  provider about hydration, which may include: °· Up to 2 hours before the procedure - you may continue to drink clear liquids, such as water, clear fruit juice, black coffee, and plain tea. °Eating and drinking restrictions °Follow instructions from your health care provider about eating and drinking, which may include: °· 8 hours before the procedure - stop eating heavy meals or foods, such as meat, fried foods, or fatty foods. °· 6 hours before the procedure - stop eating light meals or foods, such as toast or cereal. °· 6 hours before the procedure - stop drinking milk or drinks that contain milk. °· 2 hours before the procedure - stop drinking clear liquids. °Medicines °Ask your health care provider about: °· Changing or stopping your regular medicines. This is especially important if you are taking diabetes medicines or blood thinners. °· Taking medicines such as aspirin and ibuprofen. These medicines can thin your blood. Do not take these medicines unless your health care provider tells you to take them. °· Taking over-the-counter medicines, vitamins, herbs, and supplements. °Tests and exams °· You will have a physical exam. °· You may have blood tests done to show how well: °? Your kidneys and liver work. °? Your blood clots. °General instructions °· Plan to have a responsible adult take you home from the hospital or clinic. °· If you will be going home right after the procedure, plan to have a responsible adult care for you for the time you are told. This is important. °What happens during the procedure? °· You will be given the sedative. The sedative may be given: °? As a pill that you will swallow. It can also be inserted into the rectum. °? As a spray through the nose. °? As an injection into the muscle. °? As an injection into the vein through an IV. °· You may be given oxygen as needed. °· Your breathing, heart rate, and blood pressure will be monitored during the procedure. °· The medical or dental  procedure will be done. °The procedure may vary among health care providers and hospitals.   °What happens after the procedure? °· Your blood pressure, heart rate, breathing rate, and blood oxygen level will be monitored until you leave the hospital or clinic. °· You will get fluids through your IV if needed. °· Do not drive or operate machinery until your health care provider says that it is safe. °Summary °· Sedation is the use of medicines to promote relaxation and to relieve discomfort and anxiety. Moderate conscious sedation is a type of sedation that is used during short medical and dental procedures. °· Tell the health care provider about any medical conditions that you have and about all the medicines that you are taking. °· You will be given the sedative as a pill, a spray through the nose, an injection into the muscle, or an injection into the vein through an IV. Vital signs are monitored during the sedation. °· Moderate conscious sedation allows you to return to your regular activities sooner. °This information is not intended to replace advice given to you by your health care provider. Make sure you discuss   any questions you have with your health care provider. °Document Revised: 08/30/2019 Document Reviewed: 03/28/2019 °Elsevier Patient Education © 2021 [employer] Inc. ° °  ° °

## 2020-08-17 NOTE — Procedures (Signed)
Pre Procedure Dx: Left parotid nodule and left sided cervical LAN Post Procedural Dx: Same  Technically successful US guided biopsy of left parotid nodule. Technically successful US guided biopsy of left cervical lymph node.  Note, sonographic evaluation of the left groin and medial aspect of the left thigh is negative for inguinal LAN or sonographic correlate  EBL: None No immediate complications.   Ronny Bacon, MD Pager #: [phone]

## 2020-08-18 ENCOUNTER — Telehealth (HOSPITAL_COMMUNITY): Payer: Self-pay

## 2020-08-18 NOTE — Telephone Encounter (Signed)
This patient wife left message this morning and reports that patient is feeling really bad, has no energy, coughing and difficulty breathing. She states that she performed a home Covid test on patient and that was negative. Spouse wants to know if patient should see his PCP.  Dr. Delton Coombes advised patient to see his PCP for current symptoms but to keep scheduled appointment for biopsy results.  No further questions or concerns at this time.

## 2020-08-19 ENCOUNTER — Encounter (HOSPITAL_COMMUNITY): Payer: BC Managed Care – PPO

## 2020-08-19 ENCOUNTER — Inpatient Hospital Stay (HOSPITAL_COMMUNITY): Payer: BC Managed Care – PPO | Attending: Hematology

## 2020-08-19 VITALS — BP 120/75 | HR 89 | Temp 97.8°F | Resp 18

## 2020-08-19 DIAGNOSIS — R52 Pain, unspecified: Secondary | ICD-10-CM | POA: Diagnosis not present

## 2020-08-19 DIAGNOSIS — K922 Gastrointestinal hemorrhage, unspecified: Secondary | ICD-10-CM | POA: Diagnosis not present

## 2020-08-19 DIAGNOSIS — D500 Iron deficiency anemia secondary to blood loss (chronic): Secondary | ICD-10-CM | POA: Diagnosis not present

## 2020-08-19 DIAGNOSIS — Z79899 Other long term (current) drug therapy: Secondary | ICD-10-CM | POA: Insufficient documentation

## 2020-08-19 DIAGNOSIS — R590 Localized enlarged lymph nodes: Secondary | ICD-10-CM | POA: Insufficient documentation

## 2020-08-19 MED ORDER — SODIUM CHLORIDE 0.9 % IV SOLN
200.0000 mg | Freq: Once | INTRAVENOUS | Status: AC
  Administered 2020-08-19: 200 mg via INTRAVENOUS
  Filled 2020-08-19: qty 200

## 2020-08-19 MED ORDER — SODIUM CHLORIDE 0.9 % IV SOLN
Freq: Once | INTRAVENOUS | Status: AC
Start: 2020-08-19 — End: 2020-08-19

## 2020-08-19 NOTE — Progress Notes (Signed)
Patient presents today for Venofer infusion.  Vital signs WNL.  Patient states that he was sick last week but negative for COVID and is feeling some better now.  Venofer infusion given today per MD orders.  Stable during infusion without adverse affects.  Vital signs stable.  No complaints at this time.  Patient refused the thirty minute post infusion wait time stating that he is going to go home and rest.   Discharge from clinic ambulatory in stable condition.  Alert and oriented X 3.  Follow up with [facility] as scheduled.

## 2020-08-19 NOTE — Patient Instructions (Signed)
Iron Sucrose infusion What is this medicine? IRON SUCROSE (AHY ern SOO krohs) is an iron complex. Iron is used to make healthy red blood cells, which carry oxygen and nutrients throughout the body. This medicine is used to treat iron deficiency anemia in people with chronic kidney disease. This medicine may be used for other purposes; ask your health care provider or pharmacist if you have questions. COMMON BRAND NAME(S): Venofer What should I tell my health care provider before I take this medicine? They need to know if you have any of these conditions:  anemia not caused by low iron levels  heart disease  high levels of iron in the blood  kidney disease  liver disease  an unusual or allergic reaction to iron, other medicines, foods, dyes, or preservatives  pregnant or trying to get pregnant  breast-feeding How should I use this medicine? This medicine is for infusion into a vein. It is given by a health care professional in a hospital or clinic setting. Talk to your pediatrician regarding the use of this medicine in children. While this drug may be prescribed for children as young as 2 years for selected conditions, precautions do apply. Overdosage: If you think you have taken too much of this medicine contact a poison control center or emergency room at once. NOTE: This medicine is only for you. Do not share this medicine with others. What if I miss a dose? It is important not to miss your dose. Call your doctor or health care professional if you are unable to keep an appointment. What may interact with this medicine? Do not take this medicine with any of the following medications:  deferoxamine  dimercaprol  other iron products This medicine may also interact with the following medications:  chloramphenicol  deferasirox This list may not describe all possible interactions. Give your health care provider a list of all the medicines, herbs, non-prescription drugs, or  dietary supplements you use. Also tell them if you smoke, drink alcohol, or use illegal drugs. Some items may interact with your medicine. What should I watch for while using this medicine? Visit your doctor or healthcare professional regularly. Tell your doctor or healthcare professional if your symptoms do not start to get better or if they get worse. You may need blood work done while you are taking this medicine. You may need to follow a special diet. Talk to your doctor. Foods that contain iron include: whole grains/cereals, dried fruits, beans, or peas, leafy green vegetables, and organ meats (liver, kidney). What side effects may I notice from receiving this medicine? Side effects that you should report to your doctor or health care professional as soon as possible:  allergic reactions like skin rash, itching or hives, swelling of the face, lips, or tongue  breathing problems  changes in blood pressure  cough  fast, irregular heartbeat  feeling faint or lightheaded, falls  fever or chills  flushing, sweating, or hot feelings  joint or muscle aches/pains  seizures  swelling of the ankles or feet  unusually weak or tired Side effects that usually do not require medical attention (report to your doctor or health care professional if they continue or are bothersome):  diarrhea  feeling achy  headache  irritation at site where injected  nausea, vomiting  stomach upset  tiredness This list may not describe all possible side effects. Call your doctor for medical advice about side effects. You may report side effects to FDA at 1-800-FDA-1088. Where should I keep   my medicine? This drug is given in a hospital or clinic and will not be stored at home. NOTE: This sheet is a summary. It may not cover all possible information. If you have questions about this medicine, talk to your doctor, pharmacist, or health care provider.  2021 Elsevier/Gold Standard (2011-02-10  17:14:35)  

## 2020-08-20 LAB — SURGICAL PATHOLOGY

## 2020-08-21 ENCOUNTER — Inpatient Hospital Stay (HOSPITAL_COMMUNITY): Payer: BC Managed Care – PPO

## 2020-08-21 ENCOUNTER — Other Ambulatory Visit: Payer: Self-pay

## 2020-08-21 ENCOUNTER — Encounter (HOSPITAL_COMMUNITY): Payer: Self-pay

## 2020-08-21 VITALS — BP 132/95 | HR 79 | Temp 97.5°F | Resp 18

## 2020-08-21 DIAGNOSIS — D500 Iron deficiency anemia secondary to blood loss (chronic): Secondary | ICD-10-CM

## 2020-08-21 DIAGNOSIS — R52 Pain, unspecified: Secondary | ICD-10-CM | POA: Diagnosis not present

## 2020-08-21 DIAGNOSIS — R590 Localized enlarged lymph nodes: Secondary | ICD-10-CM | POA: Diagnosis not present

## 2020-08-21 DIAGNOSIS — Z79899 Other long term (current) drug therapy: Secondary | ICD-10-CM | POA: Diagnosis not present

## 2020-08-21 DIAGNOSIS — K922 Gastrointestinal hemorrhage, unspecified: Secondary | ICD-10-CM | POA: Diagnosis not present

## 2020-08-21 LAB — SURGICAL PATHOLOGY

## 2020-08-21 MED ORDER — SODIUM CHLORIDE 0.9 % IV SOLN: Freq: Once | INTRAVENOUS | Status: AC

## 2020-08-21 MED ORDER — IRON SUCROSE 20 MG/ML IV SOLN
200.0000 mg | Freq: Once | INTRAVENOUS | Status: AC
  Administered 2020-08-21: 200 mg via INTRAVENOUS
  Filled 2020-08-21: qty 200

## 2020-08-21 NOTE — Patient Instructions (Signed)
Venofer infusion today. Return as scheduled for infusions, labs, and office visit. Call the clinic should you have any questions or concerns prior to your next scheduled visit.

## 2020-08-21 NOTE — Progress Notes (Signed)
Tolerated Venofer infusion w/o adverse reaction.  Alert, in no distress.  VSS.  Discharged ambulatory in stable condition.

## 2020-08-25 ENCOUNTER — Inpatient Hospital Stay (HOSPITAL_COMMUNITY): Payer: BC Managed Care – PPO

## 2020-08-25 ENCOUNTER — Encounter (HOSPITAL_COMMUNITY): Payer: Self-pay

## 2020-08-25 VITALS — BP 131/81 | HR 70 | Temp 97.6°F | Resp 18

## 2020-08-25 DIAGNOSIS — R52 Pain, unspecified: Secondary | ICD-10-CM | POA: Diagnosis not present

## 2020-08-25 DIAGNOSIS — D500 Iron deficiency anemia secondary to blood loss (chronic): Secondary | ICD-10-CM

## 2020-08-25 DIAGNOSIS — R590 Localized enlarged lymph nodes: Secondary | ICD-10-CM | POA: Diagnosis not present

## 2020-08-25 DIAGNOSIS — K922 Gastrointestinal hemorrhage, unspecified: Secondary | ICD-10-CM | POA: Diagnosis not present

## 2020-08-25 DIAGNOSIS — Z79899 Other long term (current) drug therapy: Secondary | ICD-10-CM | POA: Diagnosis not present

## 2020-08-25 MED ORDER — SODIUM CHLORIDE 0.9 % IV SOLN: INTRAVENOUS | Status: DC

## 2020-08-25 MED ORDER — SODIUM CHLORIDE 0.9 % IV SOLN
200.0000 mg | Freq: Once | INTRAVENOUS | Status: AC
  Administered 2020-08-25: 200 mg via INTRAVENOUS
  Filled 2020-08-25: qty 200

## 2020-08-25 NOTE — Progress Notes (Signed)
Patient tolerated iron infusion with no complaints voiced.  Peripheral IV site clean and dry with good blood return noted before and after infusion.  Band aid applied.  VSS with discharge and left in satisfactory condition with no s/s of distress noted.   

## 2020-08-25 NOTE — Patient Instructions (Signed)
[place] at [facility]  Discharge Instructions:  You received an iron infusion today.  Keep next scheduled appointment.   _______________________________________________________________  Thank you for choosing [place] at [facility] to provide your oncology and hematology care.  To afford each patient quality time with our providers, please arrive at least 15 minutes before your scheduled appointment.  You need to re-schedule your appointment if you arrive 10 or more minutes late.  We strive to give you quality time with our providers, and arriving late affects you and other patients whose appointments are after yours.  Also, if you no show three or more times for appointments you may be dismissed from the clinic.  Again, thank you for choosing [place] at [place] hope is that these requests will allow you access to exceptional care and in a timely manner. _______________________________________________________________  If you have questions after your visit, please contact our office at [phone] between the hours of 8:30 a.m. and 5:00 p.m. Voicemails left after 4:30 p.m. will not be returned until the following business day. _______________________________________________________________  For prescription refill requests, have your pharmacy contact our office. _______________________________________________________________  Recommendations made by the consultant and any test results will be sent to your referring physician. _______________________________________________________________

## 2020-08-31 ENCOUNTER — Ambulatory Visit (HOSPITAL_COMMUNITY): Payer: BC Managed Care – PPO

## 2020-09-02 ENCOUNTER — Ambulatory Visit (HOSPITAL_COMMUNITY): Payer: BC Managed Care – PPO | Admitting: Hematology

## 2020-09-08 ENCOUNTER — Other Ambulatory Visit: Payer: Self-pay | Admitting: Family Medicine

## 2020-09-22 DIAGNOSIS — D37030 Neoplasm of uncertain behavior of the parotid salivary glands: Secondary | ICD-10-CM | POA: Diagnosis not present

## 2020-09-22 DIAGNOSIS — R590 Localized enlarged lymph nodes: Secondary | ICD-10-CM | POA: Diagnosis not present

## 2020-09-28 ENCOUNTER — Other Ambulatory Visit (HOSPITAL_COMMUNITY): Payer: Self-pay

## 2020-09-28 DIAGNOSIS — K118 Other diseases of salivary glands: Secondary | ICD-10-CM

## 2020-09-28 NOTE — Progress Notes (Signed)
CT soft tissue neck order per Dr. Delton Coombes

## 2020-10-15 ENCOUNTER — Inpatient Hospital Stay (HOSPITAL_COMMUNITY): Payer: BC Managed Care – PPO

## 2020-10-19 ENCOUNTER — Ambulatory Visit (HOSPITAL_COMMUNITY)
Admission: RE | Admit: 2020-10-19 | Discharge: 2020-10-19 | Disposition: A | Discharge status: Home / Self Care | Payer: BC Managed Care – PPO | Source: Ambulatory Visit | Attending: Hematology | Admitting: Hematology

## 2020-10-19 ENCOUNTER — Inpatient Hospital Stay (HOSPITAL_COMMUNITY): Payer: BC Managed Care – PPO | Attending: Hematology

## 2020-10-19 ENCOUNTER — Other Ambulatory Visit: Payer: Self-pay

## 2020-10-19 ENCOUNTER — Encounter (HOSPITAL_COMMUNITY): Payer: Self-pay

## 2020-10-19 ENCOUNTER — Ambulatory Visit: Payer: BC Managed Care – PPO | Admitting: Nurse Practitioner

## 2020-10-19 DIAGNOSIS — K909 Intestinal malabsorption, unspecified: Secondary | ICD-10-CM | POA: Insufficient documentation

## 2020-10-19 DIAGNOSIS — Z8349 Family history of other endocrine, nutritional and metabolic diseases: Secondary | ICD-10-CM | POA: Diagnosis not present

## 2020-10-19 DIAGNOSIS — Z822 Family history of deafness and hearing loss: Secondary | ICD-10-CM | POA: Diagnosis not present

## 2020-10-19 DIAGNOSIS — Z800 Family history of malignant neoplasm of digestive organs: Secondary | ICD-10-CM | POA: Diagnosis not present

## 2020-10-19 DIAGNOSIS — K118 Other diseases of salivary glands: Secondary | ICD-10-CM | POA: Diagnosis not present

## 2020-10-19 DIAGNOSIS — D500 Iron deficiency anemia secondary to blood loss (chronic): Secondary | ICD-10-CM | POA: Insufficient documentation

## 2020-10-19 DIAGNOSIS — Z833 Family history of diabetes mellitus: Secondary | ICD-10-CM | POA: Diagnosis not present

## 2020-10-19 DIAGNOSIS — M542 Cervicalgia: Secondary | ICD-10-CM | POA: Diagnosis not present

## 2020-10-19 DIAGNOSIS — R0602 Shortness of breath: Secondary | ICD-10-CM | POA: Insufficient documentation

## 2020-10-19 DIAGNOSIS — Z79899 Other long term (current) drug therapy: Secondary | ICD-10-CM | POA: Diagnosis not present

## 2020-10-19 DIAGNOSIS — F17210 Nicotine dependence, cigarettes, uncomplicated: Secondary | ICD-10-CM | POA: Insufficient documentation

## 2020-10-19 DIAGNOSIS — Z8249 Family history of ischemic heart disease and other diseases of the circulatory system: Secondary | ICD-10-CM | POA: Insufficient documentation

## 2020-10-19 DIAGNOSIS — Z823 Family history of stroke: Secondary | ICD-10-CM | POA: Insufficient documentation

## 2020-10-19 DIAGNOSIS — Z818 Family history of other mental and behavioral disorders: Secondary | ICD-10-CM | POA: Insufficient documentation

## 2020-10-19 DIAGNOSIS — D110 Benign neoplasm of parotid gland: Secondary | ICD-10-CM | POA: Diagnosis not present

## 2020-10-19 DIAGNOSIS — K922 Gastrointestinal hemorrhage, unspecified: Secondary | ICD-10-CM | POA: Insufficient documentation

## 2020-10-19 DIAGNOSIS — E119 Type 2 diabetes mellitus without complications: Secondary | ICD-10-CM | POA: Insufficient documentation

## 2020-10-19 DIAGNOSIS — R059 Cough, unspecified: Secondary | ICD-10-CM | POA: Diagnosis not present

## 2020-10-19 DIAGNOSIS — R5383 Other fatigue: Secondary | ICD-10-CM | POA: Diagnosis not present

## 2020-10-19 DIAGNOSIS — R590 Localized enlarged lymph nodes: Secondary | ICD-10-CM | POA: Diagnosis not present

## 2020-10-19 LAB — COMPREHENSIVE METABOLIC PANEL
ALT: 22 U/L (ref 0–44)
AST: 20 U/L (ref 15–41)
Albumin: 4.1 g/dL (ref 3.5–5.0)
Alkaline Phosphatase: 107 U/L (ref 38–126)
Anion gap: 8 (ref 5–15)
BUN: 19 mg/dL (ref 6–20)
CO2: 25 mmol/L (ref 22–32)
Calcium: 9.5 mg/dL (ref 8.9–10.3)
Chloride: 101 mmol/L (ref 98–111)
Creatinine, Ser: 0.66 mg/dL (ref 0.61–1.24)
GFR, Estimated: >60 mL/min (ref >60)
Glucose, Bld: 102 mg/dL — ABNORMAL HIGH (ref 70–99)
Potassium: 4.3 mmol/L (ref 3.5–5.1)
Sodium: 134 mmol/L — ABNORMAL LOW (ref 135–145)
Total Bilirubin: 0.5 mg/dL (ref 0.3–1.2)
Total Protein: 7.9 g/dL (ref 6.5–8.1)

## 2020-10-19 LAB — CBC WITH DIFFERENTIAL/PLATELET
Abs Immature Granulocytes: 0.02 10*3/uL (ref 0.00–0.07)
Basophils Absolute: 0 10*3/uL (ref 0.0–0.1)
Basophils Relative: 1 %
Eosinophils Absolute: 0.2 10*3/uL (ref 0.0–0.5)
Eosinophils Relative: 3 %
HCT: 47.7 % (ref 39.0–52.0)
Hemoglobin: 15.7 g/dL (ref 13.0–17.0)
Immature Granulocytes: 0 %
Lymphocytes Relative: 25 %
Lymphs Abs: 1.4 10*3/uL (ref 0.7–4.0)
MCH: 33.4 pg (ref 26.0–34.0)
MCHC: 32.9 g/dL (ref 30.0–36.0)
MCV: 101.5 fL — ABNORMAL HIGH (ref 80.0–100.0)
Monocytes Absolute: 0.5 10*3/uL (ref 0.1–1.0)
Monocytes Relative: 9 %
Neutro Abs: 3.5 10*3/uL (ref 1.7–7.7)
Neutrophils Relative %: 62 %
Platelets: 153 10*3/uL (ref 150–400)
RBC: 4.7 MIL/uL (ref 4.22–5.81)
RDW: 14.4 % (ref 11.5–15.5)
WBC: 5.6 10*3/uL (ref 4.0–10.5)
nRBC: 0 % (ref 0.0–0.2)

## 2020-10-19 LAB — IRON AND TIBC
Iron: 69 ug/dL (ref 45–182)
Saturation Ratios: 19 % (ref 17.9–39.5)
TIBC: 360 ug/dL (ref 250–450)
UIBC: 291 ug/dL

## 2020-10-19 LAB — LACTATE DEHYDROGENASE: LDH: 143 U/L (ref 98–192)

## 2020-10-19 LAB — FERRITIN: Ferritin: 41 ng/mL (ref 24–336)

## 2020-10-19 LAB — POCT I-STAT CREATININE: Creatinine, Ser: 0.6 mg/dL — ABNORMAL LOW (ref 0.61–1.24)

## 2020-10-19 MED ORDER — IOHEXOL 300 MG/ML  SOLN
75.0000 mL | Freq: Once | INTRAMUSCULAR | Status: AC | PRN
  Administered 2020-10-19: 75 mL via INTRAVENOUS

## 2020-10-21 NOTE — Progress Notes (Signed)
Stephen Roberts, Stephen Roberts   CLINIC:  Medical Oncology/Hematology  PCP:  Default, Provider, MD None  None  REASON FOR VISIT:  Follow-up for lymphadenopathy and parotid mass  PRIOR THERAPY: Intermittent Venofer last on 08/25/2020  CURRENT THERAPY: surveillance  INTERVAL HISTORY:  Stephen Roberts, a 55 y.o. male, returns for routine follow-up for his lymphadenopathy and parotid mass. Stephen Roberts was last seen on 08/05/2020.  Today he reports feeling well. He reports swelling and pain in the lymph nodes on the left side of his neck below his ear. He reports aching in the lymph nodes at his groin and under his arms but denies swelling of these lymph nodes. He denies any fever or night sweats.  Last colonoscopy on 03/26/2020.  REVIEW OF SYSTEMS:  Review of Systems  Constitutional:  Positive for fatigue (75%). Negative for appetite change and fever.  Respiratory:  Positive for cough and shortness of breath (smoker).   Musculoskeletal:  Positive for neck pain (swollen lymph nodes).       Pain: aching; groin and under arms  Neurological:  Positive for numbness (arm).  Psychiatric/Behavioral:  Positive for depression and sleep disturbance. The patient is nervous/anxious.   All other systems reviewed and are negative.  PAST MEDICAL/SURGICAL HISTORY:  Past Medical History:  Diagnosis Date   Arthritis    Diabetes (HCC)    GERD (gastroesophageal reflux disease)    Heart murmur    Pneumonia 06/27/2006   Past Surgical History:  Procedure Laterality Date   ANTERIOR FUSION CERVICAL SPINE     COLONOSCOPY WITH PROPOFOL N/A 03/26/2020   Procedure: COLONOSCOPY WITH PROPOFOL;  Surgeon: Daneil Dolin, MD;  [facility];  Service: Endoscopy;  Laterality: N/A;   disc in neck Left 05/2017   ESOPHAGEAL DILATION N/A 03/25/2020   Procedure: ESOPHAGEAL DILATION;  Surgeon: Rogene Houston, MD;  [facility]: [facility];  Service: Endoscopy;   Laterality: N/A;   ESOPHAGOGASTRODUODENOSCOPY (EGD) WITH PROPOFOL N/A 03/25/2020   Procedure: ESOPHAGOGASTRODUODENOSCOPY (EGD) WITH PROPOFOL;  Surgeon: Rogene Houston, MD;  [facility]: [facility];  Service: Endoscopy;  Laterality: N/A;   GIVENS CAPSULE STUDY N/A 04/13/2020   Procedure: GIVENS CAPSULE STUDY;  Surgeon: Daneil Dolin, MD;  [facility];  Service: Endoscopy;  Laterality: N/A;  7:30am   INSERTION OF MESH N/A 01/23/2013   Procedure: INSERTION OF MESH;  Surgeon: Adin Hector, MD;  Location: [facility];  Service: General;  Laterality: N/A;   POLYPECTOMY  03/26/2020   Procedure: POLYPECTOMY;  Surgeon: Daneil Dolin, MD;  [facility];  Service: Endoscopy;;   TONSILLECTOMY     as child   VENTRAL HERNIA REPAIR N/A 01/23/2013   Procedure: LAPAROSCOPIC MULTIPLE VENTRAL HERNIAS;  Surgeon: Adin Hector, MD;  Location: [facility];  Service: General;  Laterality: N/A;    SOCIAL HISTORY:  Social History   Socioeconomic History   Marital status: Married    Spouse name: Not on file   Number of children: Not on file   Years of education: Not on file   Highest education level: Not on file  Occupational History   Not on file  Tobacco Use   Smoking status: Current Every Day Smoker    Packs/day: 0.50    Years: 42.00    Pack years: 21.00    Types: Cigarettes    Start date: 64   Smokeless tobacco: Never Used  Substance and Sexual Activity  Alcohol use: Yes    Comment: moderately   Drug use: Not Currently    Types: Cocaine    Comment: states he has tried it all. Last used cocaine a few months ago.    Sexual activity: Yes  Other Topics Concern   Not on file  Social History Narrative   Not on file   Social Determinants of Health   Financial Resource Strain: Low Risk    Difficulty of Paying Living Expenses: Not hard at all  Food Insecurity: No Food Insecurity   Worried About [place] in the Last Year: Never true   [place] in the  Last Year: Never true  Transportation Needs: No Transportation Needs   Lack of Transportation (Medical): No   Lack of Transportation (Non-Medical): No  Physical Activity: Inactive   Days of Exercise per Week: 0 days   Minutes of Exercise per Session: 0 min  Stress: Stress Concern Present   Feeling of Stress : Very much  Social Connections: Moderately Isolated   Frequency of Communication with Friends and Family: More than three times a week   Frequency of Social Gatherings with Friends and Family: More than three times a week   Attends Religious Services: Never   Marine scientist or Organizations: No   Attends Music therapist: Never   Marital Status: Married  Human resources officer Violence: Not At Risk   Fear of Current or Ex-Partner: No   Emotionally Abused: No   Physically Abused: No   Sexually Abused: No    FAMILY HISTORY:  Family History  Problem Relation Age of Onset   Depression Mother    Hearing loss Mother    Hyperlipidemia Mother    Hypertension Mother    Stroke Mother    Early death Father    Diabetes Brother    Pancreatic cancer Paternal Uncle    Colon cancer Neg Hx     CURRENT MEDICATIONS:  Current Outpatient Medications  Medication Sig Dispense Refill   albuterol (VENTOLIN HFA) 108 (90 Base) MCG/ACT inhaler INHALE 2 PUFFS INTO THE LUNGS EVERY 6 HOURS AS NEEDED FOR WHEEZING OR SHORTNESS OF BREATH 18 g 0   ferrous sulfate 325 (65 FE) MG EC tablet Take 1 tablet (325 mg total) by mouth 2 (two) times daily. 60 tablet 3   Multiple Vitamin (MULTIVITAMIN) tablet Take 1 tablet by mouth daily.     Omega-3 Fatty Acids (OMEGA-3 PO) Take 500 mg by mouth daily.     pantoprazole (PROTONIX) 40 MG tablet Take 1 tablet (40 mg total) by mouth daily. 902 tablet 1   Turmeric 500 MG CAPS Take 500 mg by mouth daily.     vitamin B-12 (CYANOCOBALAMIN) 1000 MCG tablet Take 1,000 mcg by mouth daily.     No current facility-administered medications for this visit.    Facility-Administered Medications Ordered in Other Visits  Medication Dose Route Frequency Provider Last Rate Last Admin   0.9 %  sodium chloride infusion   Intravenous Once Pennington, Rebekah M, PA-C       iron sucrose (VENOFER) 200 mg in sodium chloride 0.9 % 100 mL IVPB  200 mg Intravenous Once Pennington, Rebekah M, PA-C        ALLERGIES:  Allergies  Allergen Reactions   Gabapentin Shortness Of Breath   Lyrica [Pregabalin] Shortness Of Breath    PHYSICAL EXAM:  Performance status (ECOG): 1 - Symptomatic but completely ambulatory  There were no vitals filed for this  visit. Wt Readings from Last 3 Encounters:  08/17/20 230 lb (104.3 kg)  08/05/20 231 lb 12.8 oz (105.1 kg)  07/22/20 226 lb 2 oz (102.6 kg)   Physical Exam Vitals reviewed.  Constitutional:      Appearance: Normal appearance.  Cardiovascular:     Rate and Rhythm: Normal rate and regular rhythm.     Pulses: Normal pulses.     Heart sounds: Normal heart sounds.  Pulmonary:     Effort: Pulmonary effort is normal.     Breath sounds: Normal breath sounds.  Chest:  Breasts:    Right: No axillary adenopathy or supraclavicular adenopathy.     Left: No axillary adenopathy or supraclavicular adenopathy.  Abdominal:     Palpations: Abdomen is soft. There is no hepatomegaly, splenomegaly or mass.     Tenderness: There is no abdominal tenderness.  Musculoskeletal:     Right lower leg: No edema.     Left lower leg: No edema.  Lymphadenopathy:     Cervical: No cervical adenopathy.     Right cervical: No superficial cervical adenopathy.    Left cervical: No superficial cervical adenopathy.     Upper Body:     Right upper body: No supraclavicular, axillary or pectoral adenopathy.     Left upper body: No supraclavicular, axillary or pectoral adenopathy.  Neurological:     General: No focal deficit present.     Mental Status: He is alert and oriented to person, place, and time.  Psychiatric:        Mood and  Affect: Mood normal.        Behavior: Behavior normal.    LABORATORY DATA:  I have reviewed the labs as listed.  CBC Latest Ref Rng & Units 10/19/2020 08/06/2020 07/22/2020  WBC 4.0 - 10.5 K/uL 5.6 7.2 6.7  Hemoglobin 13.0 - 17.0 g/dL 15.7 13.7 14.5  Hematocrit 39.0 - 52.0 % 47.7 43.4 46.2  Platelets 150 - 400 K/uL 153 151 166   CMP Latest Ref Rng & Units 10/19/2020 10/19/2020 07/17/2020  Glucose 70 - 99 mg/dL 102(H) - 81  BUN 6 - 20 mg/dL 19 - 15  Creatinine 0.61 - 1.24 mg/dL 0.66 0.60(L) 0.67(L)  Sodium 135 - 145 mmol/L 134(L) - 139  Potassium 3.5 - 5.1 mmol/L 4.3 - 4.2  Chloride 98 - 111 mmol/L 101 - 104  CO2 22 - 32 mmol/L 25 - 25  Calcium 8.9 - 10.3 mg/dL 9.5 - 9.6  Total Protein 6.5 - 8.1 g/dL 7.9 - 7.0  Total Bilirubin 0.3 - 1.2 mg/dL 0.5 - 0.2  Alkaline Phos 38 - 126 U/L 107 - -  AST 15 - 41 U/L 20 - 19  ALT 0 - 44 U/L 22 - 25      Component Value Date/Time   RBC 4.70 10/19/2020 1012   MCV 101.5 (H) 10/19/2020 1012   MCH 33.4 10/19/2020 1012   MCHC 32.9 10/19/2020 1012   RDW 14.4 10/19/2020 1012   LYMPHSABS 1.4 10/19/2020 1012   MONOABS 0.5 10/19/2020 1012   EOSABS 0.2 10/19/2020 1012   BASOSABS 0.0 10/19/2020 1012    DIAGNOSTIC IMAGING:  I have independently reviewed the scans and discussed with the patient. CT SOFT TISSUE NECK W CONTRAST  Result Date: 10/19/2020 CLINICAL DATA:  55 year old male with left parotid gland lesion and regional enlarged lymph nodes status post ultrasound-guided biopsy in April revealing probable Warthin's tumor and nonspecific lymphoproliferative process, respectively. Restaging. EXAM: CT NECK WITH CONTRAST TECHNIQUE: Multidetector CT imaging  of the neck was performed using the standard protocol following the bolus administration of intravenous contrast. CONTRAST:  98m OMNIPAQUE IOHEXOL 300 MG/ML  SOLN COMPARISON:  Neck CT 08/04/2020. FINDINGS: Pharynx and larynx: Motion artifact at the larynx and pharynx today. No hyperenhancement is evident. The  parapharyngeal and retropharyngeal spaces appear to remain normal. Salivary glands: Mild motion artifact at the sublingual space today. Submandibular glands appears stable and within normal limits. Right parotid gland is stable and negative. 16-17 mm posterior superficial lobe left parotid hyperdense soft tissue nodule is stable since March on series 2, image 35 and marked as a palpable area of concern. The remainder of the left parotid gland is within normal limits. Left stylomastoid foramen soft tissue appears normal. Thyroid: Negative. Lymph nodes: The dominant left level 2A lymph node seen on series 2, image 47 today has slightly decreased, now 11 mm short axis. Other regional level 2A and 2B lymph nodes appears stable and within normal limits. No cystic or necrotic nodes. Other cervical lymph node stations are stable. And right side level 2 nodes also measure up to 11 mm short axis (on series 2, image 57. Vascular: Major vascular structures in the neck and at the skull base appear patent, with venous dominant contrast timing. Left vertebral artery appears dominant. Limited intracranial: Negative. Visualized orbits: Negative. Mastoids and visualized paranasal sinuses: Stable mild maxillary alveolar recess mucosal thickening. Skeleton: Multilevel postoperative changes in the cervical spine including disc arthroplasty and ACDF are stable. Underlying cervical spine degeneration. No acute or suspicious osseous lesion identified. Upper chest: Visible superior mediastinum is stable and within normal limits. Negative upper lungs. Visible axillary lymph nodes are within normal limits. IMPRESSION: 1. Stable posterior left parotid 16-17 mm soft tissue nodule since March. Imaging appearance is compatible with benign etiology. 2. Mildly decreased left level 2 lymph node size since March, now symmetric although slightly above normal (11 mm short axis). 3. No new abnormality in the neck. Electronically Signed   By: HGenevie Ann M.D.   On: 10/19/2020 10:47     ASSESSMENT:  1.  Left parotid mass and lymphadenopathy -Patient has had ongoing left-sided neck lymphadenopathy that waxes and wanes, also associated with axillary and inguinal tenderness but without discernible lymphadenopathy -Persistent fatigue since December 2021, no significant weight loss or other B symptoms -Autoimmune screen (RF, ANA) negative; mildly elevated ESR 28, CRP 1.2; HIV nonreactive, CLL FISH negative; hepatitis panel negative, LDH within normal limits at 170 -Most recent CBC (07/22/2020) within normal limits -CT soft tissue neck (08/04/2020): 17 x 15 x 13 mm mass or lymph node in the inferior aspect of the left parotid gland as well as enlarged level 1 lymph node on the left measuring 15 x 14 x 14 mm; no other asymmetric or pathologically enlarged lymph nodes in neck, but several symmetric lymphadenopathy in neck as described in full radiology report -CT chest (08/04/2020) showed no evidence of thoracic adenopathy or primary malignancy within the chest - Biopsy on 08/17/2020-left parotid nodule consistent with Warthin's tumor.  Left cervical lymph node core biopsy showed scant nodal tissue fragments without morphologic/immunophenotypic evidence of lymphoproliferative disorder.   2.  Iron deficiency anemia secondary to occult GI bleed and malabsorption -Patient was hospitalized in November 2021 due to melena and anemia, transfused RBC x2 -Was seen by GI, suspected occult small bowel bleed related to NSAID use, although no definite source of bleeding was found on EGD or colonoscopy -Hemoglobin within normal limits at 14.5 (07/22/2020) -Iron panel (  07/23/2019) shows serum iron 42, percent saturation, ferritin 17 -Likely malabsorption of iron in the setting of concurrent PPI use   3.  Social history -Patient reports every day tobacco use (0.5 to 1 pack/day cigarettes), weekly alcohol use (4-5 beers per week); infrequent illicit drug use (cocaine) -Patient  lives at home with his wife, is on disability   PLAN:  1.  Left parotid Warthin's tumor: - He was evaluated by Dr. Benjamine Mola. - He reports that swelling in the left parotid region waxes and wanes.  Today I did not feel any swelling. - He has a follow-up with Dr. Benjamine Mola in February of next year.   2.  Iron deficiency anemia secondary to occult GI bleed - Status post 5 doses of Venofer from 08/07/2020 through 08/25/2020. - Energy levels have improved.  Labs from 10/19/2020 shows ferritin is 41.  Hemoglobin improved to 15.7.  No indication for parenteral iron therapy at this time.  3.  Left neck adenopathy: - He does not have any B symptoms.  His insurance company denied PET scan. - He continues to report waxing and waning adenopathy in the axillary region and inguinal region. - Today did not feel any palpable adenopathy. - We went over CT of the soft tissue neck with contrast from 10/19/2020 which showed stable posterior left parotid 16-17 mm soft tissue nodule since March.  Mildly decreased left level 2 lymph node measuring 11 mm short axis.  No new adenopathy in the neck region. - We will follow-up in 3 months with repeat physical exam. - We will consider reimaging in 6 months.  Orders placed this encounter:  No orders of the defined types were placed in this encounter.    Derek Jack, MD [place] [phone]   I, Thana Ates, am acting as a scribe for Dr. Derek Jack.  I, Derek Jack MD, have reviewed the above documentation for accuracy and completeness, and I agree with the above.

## 2020-10-22 ENCOUNTER — Inpatient Hospital Stay (HOSPITAL_COMMUNITY): Payer: BC Managed Care – PPO | Admitting: Hematology

## 2020-10-22 ENCOUNTER — Other Ambulatory Visit: Payer: Self-pay

## 2020-10-22 VITALS — BP 162/76 | HR 63 | Temp 97.0°F | Resp 20 | Wt 227.6 lb

## 2020-10-22 DIAGNOSIS — K909 Intestinal malabsorption, unspecified: Secondary | ICD-10-CM | POA: Diagnosis not present

## 2020-10-22 DIAGNOSIS — M542 Cervicalgia: Secondary | ICD-10-CM | POA: Diagnosis not present

## 2020-10-22 DIAGNOSIS — K118 Other diseases of salivary glands: Secondary | ICD-10-CM | POA: Diagnosis not present

## 2020-10-22 DIAGNOSIS — R590 Localized enlarged lymph nodes: Secondary | ICD-10-CM | POA: Diagnosis not present

## 2020-10-22 DIAGNOSIS — R5383 Other fatigue: Secondary | ICD-10-CM | POA: Diagnosis not present

## 2020-10-22 DIAGNOSIS — K922 Gastrointestinal hemorrhage, unspecified: Secondary | ICD-10-CM | POA: Diagnosis not present

## 2020-10-22 DIAGNOSIS — Z822 Family history of deafness and hearing loss: Secondary | ICD-10-CM | POA: Diagnosis not present

## 2020-10-22 DIAGNOSIS — R059 Cough, unspecified: Secondary | ICD-10-CM | POA: Diagnosis not present

## 2020-10-22 DIAGNOSIS — F17210 Nicotine dependence, cigarettes, uncomplicated: Secondary | ICD-10-CM | POA: Diagnosis not present

## 2020-10-22 DIAGNOSIS — D500 Iron deficiency anemia secondary to blood loss (chronic): Secondary | ICD-10-CM | POA: Diagnosis not present

## 2020-10-22 DIAGNOSIS — Z818 Family history of other mental and behavioral disorders: Secondary | ICD-10-CM | POA: Diagnosis not present

## 2020-10-22 DIAGNOSIS — Z8349 Family history of other endocrine, nutritional and metabolic diseases: Secondary | ICD-10-CM | POA: Diagnosis not present

## 2020-10-22 DIAGNOSIS — Z79899 Other long term (current) drug therapy: Secondary | ICD-10-CM | POA: Diagnosis not present

## 2020-10-22 DIAGNOSIS — D110 Benign neoplasm of parotid gland: Secondary | ICD-10-CM | POA: Diagnosis not present

## 2020-10-22 DIAGNOSIS — E119 Type 2 diabetes mellitus without complications: Secondary | ICD-10-CM | POA: Diagnosis not present

## 2020-10-22 DIAGNOSIS — Z8249 Family history of ischemic heart disease and other diseases of the circulatory system: Secondary | ICD-10-CM | POA: Diagnosis not present

## 2020-10-22 DIAGNOSIS — R0602 Shortness of breath: Secondary | ICD-10-CM | POA: Diagnosis not present

## 2020-10-22 NOTE — Patient Instructions (Signed)
[facility] at [facility] Discharge Instructions  You were seen today by Dr. Katragadda. He went over your recent results. Dr. Katragadda will see you back in 3 months for labs and follow up.   Thank you for choosing [facility] at [facility] to provide your oncology and hematology care.  To afford each patient quality time with our provider, please arrive at least 15 minutes before your scheduled appointment time.   If you have a lab appointment with the Cancer Center please come in thru the Main Entrance and check in at the main information desk  You need to re-schedule your appointment should you arrive 10 or more minutes late.  We strive to give you quality time with our providers, and arriving late affects you and other patients whose appointments are after yours.  Also, if you no show three or more times for appointments you may be dismissed from the clinic at the providers discretion.     Again, thank you for choosing [facility].  Our hope is that these requests will decrease the amount of time that you wait before being seen by our physicians.       _____________________________________________________________  Should you have questions after your visit to [facility], please contact our office at [phone] between the hours of 8:00 a.m. and 4:30 p.m.  Voicemails left after 4:00 p.m. will not be returned until the following business day.  For prescription refill requests, have your pharmacy contact our office and allow 72 hours.    Cancer Center Support Programs:   > Cancer Support Group  2nd Tuesday of the month 1pm-2pm, Journey Room   

## 2021-01-13 ENCOUNTER — Encounter (HOSPITAL_COMMUNITY): Payer: Self-pay | Admitting: Hematology

## 2021-01-20 ENCOUNTER — Inpatient Hospital Stay (HOSPITAL_COMMUNITY): Payer: PPO | Attending: Hematology

## 2021-01-20 ENCOUNTER — Encounter (HOSPITAL_COMMUNITY): Payer: Self-pay | Admitting: Hematology

## 2021-01-20 ENCOUNTER — Other Ambulatory Visit: Payer: Self-pay

## 2021-01-20 DIAGNOSIS — Z823 Family history of stroke: Secondary | ICD-10-CM | POA: Diagnosis not present

## 2021-01-20 DIAGNOSIS — R0602 Shortness of breath: Secondary | ICD-10-CM | POA: Diagnosis not present

## 2021-01-20 DIAGNOSIS — F17210 Nicotine dependence, cigarettes, uncomplicated: Secondary | ICD-10-CM | POA: Diagnosis not present

## 2021-01-20 DIAGNOSIS — Z822 Family history of deafness and hearing loss: Secondary | ICD-10-CM | POA: Insufficient documentation

## 2021-01-20 DIAGNOSIS — G479 Sleep disorder, unspecified: Secondary | ICD-10-CM | POA: Insufficient documentation

## 2021-01-20 DIAGNOSIS — M2550 Pain in unspecified joint: Secondary | ICD-10-CM | POA: Insufficient documentation

## 2021-01-20 DIAGNOSIS — E119 Type 2 diabetes mellitus without complications: Secondary | ICD-10-CM | POA: Insufficient documentation

## 2021-01-20 DIAGNOSIS — Z888 Allergy status to other drugs, medicaments and biological substances status: Secondary | ICD-10-CM | POA: Diagnosis not present

## 2021-01-20 DIAGNOSIS — R059 Cough, unspecified: Secondary | ICD-10-CM | POA: Insufficient documentation

## 2021-01-20 DIAGNOSIS — K909 Intestinal malabsorption, unspecified: Secondary | ICD-10-CM | POA: Diagnosis not present

## 2021-01-20 DIAGNOSIS — K922 Gastrointestinal hemorrhage, unspecified: Secondary | ICD-10-CM | POA: Diagnosis not present

## 2021-01-20 DIAGNOSIS — R591 Generalized enlarged lymph nodes: Secondary | ICD-10-CM | POA: Diagnosis not present

## 2021-01-20 DIAGNOSIS — D110 Benign neoplasm of parotid gland: Secondary | ICD-10-CM | POA: Diagnosis not present

## 2021-01-20 DIAGNOSIS — Z8349 Family history of other endocrine, nutritional and metabolic diseases: Secondary | ICD-10-CM | POA: Insufficient documentation

## 2021-01-20 DIAGNOSIS — K219 Gastro-esophageal reflux disease without esophagitis: Secondary | ICD-10-CM | POA: Insufficient documentation

## 2021-01-20 DIAGNOSIS — Z79899 Other long term (current) drug therapy: Secondary | ICD-10-CM | POA: Diagnosis not present

## 2021-01-20 DIAGNOSIS — M549 Dorsalgia, unspecified: Secondary | ICD-10-CM | POA: Insufficient documentation

## 2021-01-20 DIAGNOSIS — Z8249 Family history of ischemic heart disease and other diseases of the circulatory system: Secondary | ICD-10-CM | POA: Insufficient documentation

## 2021-01-20 DIAGNOSIS — Z833 Family history of diabetes mellitus: Secondary | ICD-10-CM | POA: Insufficient documentation

## 2021-01-20 DIAGNOSIS — R1110 Vomiting, unspecified: Secondary | ICD-10-CM | POA: Diagnosis not present

## 2021-01-20 DIAGNOSIS — Z818 Family history of other mental and behavioral disorders: Secondary | ICD-10-CM | POA: Insufficient documentation

## 2021-01-20 DIAGNOSIS — D500 Iron deficiency anemia secondary to blood loss (chronic): Secondary | ICD-10-CM | POA: Diagnosis not present

## 2021-01-20 LAB — CBC WITH DIFFERENTIAL/PLATELET
Abs Immature Granulocytes: 0.02 10*3/uL (ref 0.00–0.07)
Basophils Absolute: 0.1 10*3/uL (ref 0.0–0.1)
Basophils Relative: 1 %
Eosinophils Absolute: 0.2 10*3/uL (ref 0.0–0.5)
Eosinophils Relative: 3 %
HCT: 46.5 % (ref 39.0–52.0)
Hemoglobin: 15.2 g/dL (ref 13.0–17.0)
Immature Granulocytes: 0 %
Lymphocytes Relative: 22 %
Lymphs Abs: 1.7 10*3/uL (ref 0.7–4.0)
MCH: 33 pg (ref 26.0–34.0)
MCHC: 32.7 g/dL (ref 30.0–36.0)
MCV: 100.9 fL — ABNORMAL HIGH (ref 80.0–100.0)
Monocytes Absolute: 0.5 10*3/uL (ref 0.1–1.0)
Monocytes Relative: 6 %
Neutro Abs: 5.4 10*3/uL (ref 1.7–7.7)
Neutrophils Relative %: 68 %
Platelets: 165 10*3/uL (ref 150–400)
RBC: 4.61 MIL/uL (ref 4.22–5.81)
RDW: 13.7 % (ref 11.5–15.5)
WBC: 7.9 10*3/uL (ref 4.0–10.5)
nRBC: 0 % (ref 0.0–0.2)

## 2021-01-20 LAB — COMPREHENSIVE METABOLIC PANEL
ALT: 23 U/L (ref 0–44)
AST: 22 U/L (ref 15–41)
Albumin: 4.3 g/dL (ref 3.5–5.0)
Alkaline Phosphatase: 122 U/L (ref 38–126)
Anion gap: 8 (ref 5–15)
BUN: 20 mg/dL (ref 6–20)
CO2: 27 mmol/L (ref 22–32)
Calcium: 9.7 mg/dL (ref 8.9–10.3)
Chloride: 103 mmol/L (ref 98–111)
Creatinine, Ser: 0.72 mg/dL (ref 0.61–1.24)
GFR, Estimated: >60 mL/min (ref >60)
Glucose, Bld: 140 mg/dL — ABNORMAL HIGH (ref 70–99)
Potassium: 4.6 mmol/L (ref 3.5–5.1)
Sodium: 138 mmol/L (ref 135–145)
Total Bilirubin: 0.4 mg/dL (ref 0.3–1.2)
Total Protein: 8 g/dL (ref 6.5–8.1)

## 2021-01-20 LAB — LACTATE DEHYDROGENASE: LDH: 161 U/L (ref 98–192)

## 2021-01-20 LAB — FERRITIN: Ferritin: 44 ng/mL (ref 24–336)

## 2021-01-26 NOTE — Progress Notes (Signed)
Stephen Roberts, [place] [id number]   CLINIC:  Medical Oncology/Hematology  PCP:  Default, Provider, MD None None   REASON FOR VISIT:  Follow-up for lymphadenopathy and parotid mass  PRIOR THERAPY: none  NGS Results: not done  CURRENT THERAPY: intermittent Venofer last on 08/25/2020   INTERVAL HISTORY:  Stephen Roberts, a 55 y.o. male, returns for routine follow-up of his lymphadenopathy and parotid mass. Stephen Roberts was last seen on 08/05/2020.   Today he reports feeling good. He reports strong smells causes swelling of a lymph node on the left side of his neck. He is smoking 1/2 ppd which also irritates the lymph nodes in his neck. He denies fevers, weight loss, night sweats, difficulty swallowing. He reports chronic acid reflux that has been slightly worse lately.   REVIEW OF SYSTEMS:  Review of Systems  Constitutional:  Negative for appetite change (75%), fatigue (60%), fever and unexpected weight change.  HENT:   Negative for trouble swallowing.   Respiratory:  Positive for cough and shortness of breath.   Gastrointestinal:  Positive for vomiting.  Musculoskeletal:  Positive for arthralgias (6/10) and back pain (6/10).  Psychiatric/Behavioral:  Positive for sleep disturbance.   All other systems reviewed and are negative.  PAST MEDICAL/SURGICAL HISTORY:  Past Medical History:  Diagnosis Date   Arthritis    Diabetes (HCC)    GERD (gastroesophageal reflux disease)    Heart murmur    Pneumonia 06/27/2006   Past Surgical History:  Procedure Laterality Date   ANTERIOR FUSION CERVICAL SPINE     COLONOSCOPY WITH PROPOFOL N/A 03/26/2020   Procedure: COLONOSCOPY WITH PROPOFOL;  Surgeon: Daneil Dolin, MD;  [facility]: [facility];  Service: Endoscopy;  Laterality: N/A;   disc in neck Left 05/2017   ESOPHAGEAL DILATION N/A 03/25/2020   Procedure: ESOPHAGEAL DILATION;  Surgeon: Rogene Houston, MD;  [facility]: [facility];  Service:  Endoscopy;  Laterality: N/A;   ESOPHAGOGASTRODUODENOSCOPY (EGD) WITH PROPOFOL N/A 03/25/2020   Procedure: ESOPHAGOGASTRODUODENOSCOPY (EGD) WITH PROPOFOL;  Surgeon: Rogene Houston, MD;  [facility]: [facility];  Service: Endoscopy;  Laterality: N/A;   GIVENS CAPSULE STUDY N/A 04/13/2020   Procedure: GIVENS CAPSULE STUDY;  Surgeon: Daneil Dolin, MD;  [facility];  Service: Endoscopy;  Laterality: N/A;  7:30am   INSERTION OF MESH N/A 01/23/2013   Procedure: INSERTION OF MESH;  Surgeon: Adin Hector, MD;  Location: [facility];  Service: General;  Laterality: N/A;   POLYPECTOMY  03/26/2020   Procedure: POLYPECTOMY;  Surgeon: Daneil Dolin, MD;  [facility]: [facility];  Service: Endoscopy;;   TONSILLECTOMY     as child   VENTRAL HERNIA REPAIR N/A 01/23/2013   Procedure: LAPAROSCOPIC MULTIPLE VENTRAL HERNIAS;  Surgeon: Adin Hector, MD;  Location: [facility];  Service: General;  Laterality: N/A;    SOCIAL HISTORY:  Social History   Socioeconomic History   Marital status: Married    Spouse name: Not on file   Number of children: Not on file   Years of education: Not on file   Highest education level: Not on file  Occupational History   Not on file  Tobacco Use   Smoking status: Every Day    Packs/day: 0.50    Years: 42.00    Pack years: 21.00    Types: Cigarettes    Start date: 18   Smokeless tobacco: Never  Substance and Sexual Activity   Alcohol use:  Yes    Comment: moderately   Drug use: Not Currently    Types: Cocaine    Comment: states he has tried it all. Last used cocaine a few months ago.    Sexual activity: Yes  Other Topics Concern   Not on file  Social History Narrative   Not on file   Social Determinants of Health   Financial Resource Strain: Low Risk    Difficulty of Paying Living Expenses: Not hard at all  Food Insecurity: No Food Insecurity   Worried About [place] in the Last Year: Never true   [place] in the Last  Year: Never true  Transportation Needs: No Transportation Needs   Lack of Transportation (Medical): No   Lack of Transportation (Non-Medical): No  Physical Activity: Inactive   Days of Exercise per Week: 0 days   Minutes of Exercise per Session: 0 min  Stress: Stress Concern Present   Feeling of Stress : Very much  Social Connections: Moderately Isolated   Frequency of Communication with Friends and Family: More than three times a week   Frequency of Social Gatherings with Friends and Family: More than three times a week   Attends Religious Services: Never   Marine scientist or Organizations: No   Attends Music therapist: Never   Marital Status: Married  Human resources officer Violence: Not At Risk   Fear of Current or Ex-Partner: No   Emotionally Abused: No   Physically Abused: No   Sexually Abused: No    FAMILY HISTORY:  Family History  Problem Relation Age of Onset   Depression Mother    Hearing loss Mother    Hyperlipidemia Mother    Hypertension Mother    Stroke Mother    Early death Father    Diabetes Brother    Pancreatic cancer Paternal Uncle    Colon cancer Neg Hx     CURRENT MEDICATIONS:  Current Outpatient Medications  Medication Sig Dispense Refill   albuterol (VENTOLIN HFA) 108 (90 Base) MCG/ACT inhaler INHALE 2 PUFFS INTO THE LUNGS EVERY 6 HOURS AS NEEDED FOR WHEEZING OR SHORTNESS OF BREATH 18 g 0   ferrous sulfate 325 (65 FE) MG EC tablet Take 1 tablet (325 mg total) by mouth 2 (two) times daily. 60 tablet 3   Multiple Vitamin (MULTIVITAMIN) tablet Take 1 tablet by mouth daily.     Omega-3 Fatty Acids (OMEGA-3 PO) Take 500 mg by mouth daily.     pantoprazole (PROTONIX) 40 MG tablet Take 1 tablet (40 mg total) by mouth daily. 902 tablet 1   Turmeric 500 MG CAPS Take 500 mg by mouth daily.     vitamin B-12 (CYANOCOBALAMIN) 1000 MCG tablet Take 1,000 mcg by mouth daily.     No current facility-administered medications for this visit.    Facility-Administered Medications Ordered in Other Visits  Medication Dose Route Frequency Provider Last Rate Last Admin   0.9 %  sodium chloride infusion   Intravenous Once Pennington, Rebekah M, PA-C       iron sucrose (VENOFER) 200 mg in sodium chloride 0.9 % 100 mL IVPB  200 mg Intravenous Once Pennington, Rebekah M, PA-C        ALLERGIES:  Allergies  Allergen Reactions   Gabapentin Shortness Of Breath   Lyrica [Pregabalin] Shortness Of Breath    PHYSICAL EXAM:  Performance status (ECOG): 1 - Symptomatic but completely ambulatory  There were no vitals filed for this visit. Wt  Readings from Last 3 Encounters:  10/22/20 227 lb 9.6 oz (103.2 kg)  08/17/20 230 lb (104.3 kg)  08/05/20 231 lb 12.8 oz (105.1 kg)   Physical Exam Vitals reviewed.  Constitutional:      Appearance: Normal appearance.  Cardiovascular:     Rate and Rhythm: Normal rate and regular rhythm.     Pulses: Normal pulses.     Heart sounds: Normal heart sounds.  Pulmonary:     Effort: Pulmonary effort is normal.     Breath sounds: Normal breath sounds.  Lymphadenopathy:     Cervical: No cervical adenopathy.     Right cervical: No superficial, deep or posterior cervical adenopathy.    Left cervical: No superficial, deep or posterior cervical adenopathy.     Upper Body:     Right upper body: No axillary or pectoral adenopathy.     Left upper body: No axillary or pectoral adenopathy.  Neurological:     General: No focal deficit present.     Mental Status: He is alert and oriented to person, place, and time.  Psychiatric:        Mood and Affect: Mood normal.        Behavior: Behavior normal.     LABORATORY DATA:  I have reviewed the labs as listed.  CBC Latest Ref Rng & Units 01/20/2021 10/19/2020 08/06/2020  WBC 4.0 - 10.5 K/uL 7.9 5.6 7.2  Hemoglobin 13.0 - 17.0 g/dL 15.2 15.7 13.7  Hematocrit 39.0 - 52.0 % 46.5 47.7 43.4  Platelets 150 - 400 K/uL 165 153 151   CMP Latest Ref Rng & Units 01/20/2021  10/19/2020 10/19/2020  Glucose 70 - 99 mg/dL 140(H) 102(H) -  BUN 6 - 20 mg/dL 20 19 -  Creatinine 0.61 - 1.24 mg/dL 0.72 0.66 0.60(L)  Sodium 135 - 145 mmol/L 138 134(L) -  Potassium 3.5 - 5.1 mmol/L 4.6 4.3 -  Chloride 98 - 111 mmol/L 103 101 -  CO2 22 - 32 mmol/L 27 25 -  Calcium 8.9 - 10.3 mg/dL 9.7 9.5 -  Total Protein 6.5 - 8.1 g/dL 8.0 7.9 -  Total Bilirubin 0.3 - 1.2 mg/dL 0.4 0.5 -  Alkaline Phos 38 - 126 U/L 122 107 -  AST 15 - 41 U/L 22 20 -  ALT 0 - 44 U/L 23 22 -    DIAGNOSTIC IMAGING:  I have independently reviewed the scans and discussed with the patient. No results found.   ASSESSMENT:  1.  Left parotid mass and lymphadenopathy -Patient has had ongoing left-sided neck lymphadenopathy that waxes and wanes, also associated with axillary and inguinal tenderness but without discernible lymphadenopathy -Persistent fatigue since December 2021, no significant weight loss or other B symptoms -Autoimmune screen (RF, ANA) negative; mildly elevated ESR 28, CRP 1.2; HIV nonreactive, CLL FISH negative; hepatitis panel negative, LDH within normal limits at 170 -Most recent CBC (07/22/2020) within normal limits -CT soft tissue neck (08/04/2020): 17 x 15 x 13 mm mass or lymph node in the inferior aspect of the left parotid gland as well as enlarged level 1 lymph node on the left measuring 15 x 14 x 14 mm; no other asymmetric or pathologically enlarged lymph nodes in neck, but several symmetric lymphadenopathy in neck as described in full radiology report -CT chest (08/04/2020) showed no evidence of thoracic adenopathy or primary malignancy within the chest - Biopsy on 08/17/2020-left parotid nodule consistent with Warthin's tumor.  Left cervical lymph node biopsy showed scant nodal tissue fragments without  morphologic/immunophenotypic evidence of lymphoproliferative disorder.   2.  Iron deficiency anemia secondary to occult GI bleed and malabsorption -Patient was hospitalized in November 2021  due to melena and anemia, transfused RBC x2 -Was seen by GI, suspected occult small bowel bleed related to NSAID use, although no definite source of bleeding was found on EGD or colonoscopy -Hemoglobin within normal limits at 14.5 (07/22/2020) -Iron panel (07/23/2019) shows serum iron 42, percent saturation, ferritin 17 -Likely malabsorption of iron in the setting of concurrent PPI use   3.  Social history -Patient reports every day tobacco use (0.5 to 1 pack/day cigarettes), weekly alcohol use (4-5 beers per week); infrequent illicit drug use (cocaine) -Patient lives at home with his wife, is on disability   PLAN:  1.  Left parotid Warthin's tumor: - CT soft tissue neck with contrast on 10/19/2020 showed stable posterior left parotid 16-17 mm soft tissue nodule. - Left parotid region swelling waxes and wanes. - Today the swelling is stable.  He will follow-up with Dr. Benjamine Mola in February of next year.   2.  Iron deficiency anemia secondary to occult GI bleed - He received 5 doses of Venofer Venofer, last dose on 08/25/2020. - He reports that he stopped taking PPI and is tolerating well. - Reviewed his labs today from 01/20/2021.  Ferritin is 44.  Hemoglobin 15.2 and hematocrit 46.5.  3.  Left neck adenopathy: - He does not have any B symptoms.  Previously his insurance denied PET scan.  He reportedly changed insurance to [employer]. - I do not feel any lymphadenopathy in the neck, axillary regions.  He reports that lymph node swellings increase his when he inhales gasoline/paint/hairspray.  He is continuing to smoke half pack per day of cigarettes daily.  We have counseled him to quit smoking. - Last CT soft tissue neck on 10/19/2020 showed mildly decreased left level 2 lymph node size since March, no symmetric although slightly abnormal measuring 11 millimeters in short axis. - Labs show LDH normal.  LFTs are normal. - Recommend follow-up CT scan of the neck in 3 months.    Orders placed this  encounter:  No orders of the defined types were placed in this encounter.    Derek Jack, MD [place] [phone]   I, Thana Ates, am acting as a scribe for Dr. Derek Jack.  I, Derek Jack MD, have reviewed the above documentation for accuracy and completeness, and I agree with the above.

## 2021-01-27 ENCOUNTER — Inpatient Hospital Stay (HOSPITAL_BASED_OUTPATIENT_CLINIC_OR_DEPARTMENT_OTHER): Payer: PPO | Admitting: Hematology

## 2021-01-27 ENCOUNTER — Other Ambulatory Visit: Payer: Self-pay

## 2021-01-27 VITALS — BP 141/88 | HR 87 | Temp 98.4°F | Resp 16 | Wt 227.0 lb

## 2021-01-27 DIAGNOSIS — D500 Iron deficiency anemia secondary to blood loss (chronic): Secondary | ICD-10-CM | POA: Diagnosis not present

## 2021-01-27 DIAGNOSIS — K118 Other diseases of salivary glands: Secondary | ICD-10-CM | POA: Diagnosis not present

## 2021-01-27 DIAGNOSIS — D110 Benign neoplasm of parotid gland: Secondary | ICD-10-CM | POA: Diagnosis not present

## 2021-01-27 DIAGNOSIS — R591 Generalized enlarged lymph nodes: Secondary | ICD-10-CM | POA: Diagnosis not present

## 2021-01-27 NOTE — Patient Instructions (Addendum)
[place] at [facility] Discharge Instructions  You were seen today by Dr. Delton Coombes. He went over your recent results. You will be scheduled for a CT scan of your neck prior to your next visit. Dr. Delton Coombes will see you back in 3 months for labs and follow up.   Thank you for choosing [facility] at [facility] to provide your oncology and hematology care.  To afford each patient quality time with our provider, please arrive at least 15 minutes before your scheduled appointment time.   If you have a lab appointment with the [facility] please come in thru the Main Entrance and check in at the main information desk  You need to re-schedule your appointment should you arrive 10 or more minutes late.  We strive to give you quality time with our providers, and arriving late affects you and other patients whose appointments are after yours.  Also, if you no show three or more times for appointments you may be dismissed from the clinic at the providers discretion.     Again, thank you for choosing [facility].  Our hope is that these requests will decrease the amount of time that you wait before being seen by our physicians.       _____________________________________________________________  Should you have questions after your visit to [facility], please contact our office at [phone] between the hours of 8:00 a.m. and 4:30 p.m.  Voicemails left after 4:00 p.m. will not be returned until the following business day.  For prescription refill requests, have your pharmacy contact our office and allow 72 hours.    Cancer Center Support Programs:   > Cancer Support Group  2nd Tuesday of the month 1pm-2pm, Journey Room

## 2021-03-24 ENCOUNTER — Encounter (HOSPITAL_COMMUNITY): Payer: Self-pay | Admitting: Hematology

## 2021-04-02 DIAGNOSIS — D37030 Neoplasm of uncertain behavior of the parotid salivary glands: Secondary | ICD-10-CM | POA: Diagnosis not present

## 2021-04-02 DIAGNOSIS — K1123 Chronic sialoadenitis: Secondary | ICD-10-CM | POA: Diagnosis not present

## 2021-04-27 ENCOUNTER — Ambulatory Visit (HOSPITAL_COMMUNITY): Payer: PPO

## 2021-04-27 ENCOUNTER — Inpatient Hospital Stay (HOSPITAL_COMMUNITY): Payer: PPO

## 2021-05-03 ENCOUNTER — Ambulatory Visit (HOSPITAL_COMMUNITY): Payer: PPO | Admitting: Hematology

## 2021-05-25 ENCOUNTER — Encounter (HOSPITAL_COMMUNITY): Payer: Self-pay | Admitting: Hematology

## 2021-05-26 ENCOUNTER — Encounter (HOSPITAL_COMMUNITY): Payer: Self-pay | Admitting: Hematology

## 2021-06-01 ENCOUNTER — Ambulatory Visit (HOSPITAL_COMMUNITY)
Admission: RE | Admit: 2021-06-01 | Discharge: 2021-06-01 | Disposition: A | Discharge status: Home / Self Care | Payer: PPO | Source: Ambulatory Visit | Attending: Hematology | Admitting: Hematology

## 2021-06-01 ENCOUNTER — Inpatient Hospital Stay (HOSPITAL_COMMUNITY): Payer: PPO | Attending: Hematology

## 2021-06-01 ENCOUNTER — Other Ambulatory Visit: Payer: Self-pay

## 2021-06-01 DIAGNOSIS — G479 Sleep disorder, unspecified: Secondary | ICD-10-CM | POA: Insufficient documentation

## 2021-06-01 DIAGNOSIS — R059 Cough, unspecified: Secondary | ICD-10-CM | POA: Insufficient documentation

## 2021-06-01 DIAGNOSIS — M47812 Spondylosis without myelopathy or radiculopathy, cervical region: Secondary | ICD-10-CM | POA: Insufficient documentation

## 2021-06-01 DIAGNOSIS — R591 Generalized enlarged lymph nodes: Secondary | ICD-10-CM | POA: Insufficient documentation

## 2021-06-01 DIAGNOSIS — D500 Iron deficiency anemia secondary to blood loss (chronic): Secondary | ICD-10-CM

## 2021-06-01 DIAGNOSIS — R42 Dizziness and giddiness: Secondary | ICD-10-CM | POA: Insufficient documentation

## 2021-06-01 DIAGNOSIS — Z823 Family history of stroke: Secondary | ICD-10-CM | POA: Insufficient documentation

## 2021-06-01 DIAGNOSIS — Z8249 Family history of ischemic heart disease and other diseases of the circulatory system: Secondary | ICD-10-CM | POA: Insufficient documentation

## 2021-06-01 DIAGNOSIS — J320 Chronic maxillary sinusitis: Secondary | ICD-10-CM | POA: Insufficient documentation

## 2021-06-01 DIAGNOSIS — E041 Nontoxic single thyroid nodule: Secondary | ICD-10-CM | POA: Diagnosis not present

## 2021-06-01 DIAGNOSIS — Z8349 Family history of other endocrine, nutritional and metabolic diseases: Secondary | ICD-10-CM | POA: Insufficient documentation

## 2021-06-01 DIAGNOSIS — Z800 Family history of malignant neoplasm of digestive organs: Secondary | ICD-10-CM | POA: Insufficient documentation

## 2021-06-01 DIAGNOSIS — Z822 Family history of deafness and hearing loss: Secondary | ICD-10-CM | POA: Insufficient documentation

## 2021-06-01 DIAGNOSIS — J341 Cyst and mucocele of nose and nasal sinus: Secondary | ICD-10-CM | POA: Diagnosis not present

## 2021-06-01 DIAGNOSIS — R0602 Shortness of breath: Secondary | ICD-10-CM | POA: Insufficient documentation

## 2021-06-01 DIAGNOSIS — F17210 Nicotine dependence, cigarettes, uncomplicated: Secondary | ICD-10-CM | POA: Insufficient documentation

## 2021-06-01 DIAGNOSIS — R200 Anesthesia of skin: Secondary | ICD-10-CM | POA: Insufficient documentation

## 2021-06-01 DIAGNOSIS — D110 Benign neoplasm of parotid gland: Secondary | ICD-10-CM | POA: Insufficient documentation

## 2021-06-01 DIAGNOSIS — R109 Unspecified abdominal pain: Secondary | ICD-10-CM | POA: Insufficient documentation

## 2021-06-01 DIAGNOSIS — R590 Localized enlarged lymph nodes: Secondary | ICD-10-CM | POA: Insufficient documentation

## 2021-06-01 DIAGNOSIS — Z833 Family history of diabetes mellitus: Secondary | ICD-10-CM | POA: Insufficient documentation

## 2021-06-01 DIAGNOSIS — K118 Other diseases of salivary glands: Secondary | ICD-10-CM | POA: Insufficient documentation

## 2021-06-01 DIAGNOSIS — R519 Headache, unspecified: Secondary | ICD-10-CM | POA: Insufficient documentation

## 2021-06-01 DIAGNOSIS — Z888 Allergy status to other drugs, medicaments and biological substances status: Secondary | ICD-10-CM | POA: Insufficient documentation

## 2021-06-01 DIAGNOSIS — E119 Type 2 diabetes mellitus without complications: Secondary | ICD-10-CM | POA: Insufficient documentation

## 2021-06-01 DIAGNOSIS — R1110 Vomiting, unspecified: Secondary | ICD-10-CM | POA: Insufficient documentation

## 2021-06-01 DIAGNOSIS — Z818 Family history of other mental and behavioral disorders: Secondary | ICD-10-CM | POA: Insufficient documentation

## 2021-06-01 DIAGNOSIS — D509 Iron deficiency anemia, unspecified: Secondary | ICD-10-CM | POA: Insufficient documentation

## 2021-06-01 DIAGNOSIS — Z79899 Other long term (current) drug therapy: Secondary | ICD-10-CM | POA: Insufficient documentation

## 2021-06-01 LAB — COMPREHENSIVE METABOLIC PANEL
ALT: 21 U/L (ref 0–44)
AST: 20 U/L (ref 15–41)
Albumin: 3.9 g/dL (ref 3.5–5.0)
Alkaline Phosphatase: 117 U/L (ref 38–126)
Anion gap: 11 (ref 5–15)
BUN: 23 mg/dL — ABNORMAL HIGH (ref 6–20)
CO2: 22 mmol/L (ref 22–32)
Calcium: 8.9 mg/dL (ref 8.9–10.3)
Chloride: 101 mmol/L (ref 98–111)
Creatinine, Ser: 0.68 mg/dL (ref 0.61–1.24)
GFR, Estimated: >60 mL/min (ref >60)
Glucose, Bld: 206 mg/dL — ABNORMAL HIGH (ref 70–99)
Potassium: 3.9 mmol/L (ref 3.5–5.1)
Sodium: 134 mmol/L — ABNORMAL LOW (ref 135–145)
Total Bilirubin: 0.5 mg/dL (ref 0.3–1.2)
Total Protein: 7.6 g/dL (ref 6.5–8.1)

## 2021-06-01 LAB — CBC WITH DIFFERENTIAL/PLATELET
Abs Immature Granulocytes: 0.03 10*3/uL (ref 0.00–0.07)
Basophils Absolute: 0.1 10*3/uL (ref 0.0–0.1)
Basophils Relative: 1 %
Eosinophils Absolute: 0.2 10*3/uL (ref 0.0–0.5)
Eosinophils Relative: 2 %
HCT: 39.8 % (ref 39.0–52.0)
Hemoglobin: 13.5 g/dL (ref 13.0–17.0)
Immature Granulocytes: 0 %
Lymphocytes Relative: 30 %
Lymphs Abs: 2.7 10*3/uL (ref 0.7–4.0)
MCH: 33.6 pg (ref 26.0–34.0)
MCHC: 33.9 g/dL (ref 30.0–36.0)
MCV: 99 fL (ref 80.0–100.0)
Monocytes Absolute: 0.6 10*3/uL (ref 0.1–1.0)
Monocytes Relative: 7 %
Neutro Abs: 5.4 10*3/uL (ref 1.7–7.7)
Neutrophils Relative %: 60 %
Platelets: 157 10*3/uL (ref 150–400)
RBC: 4.02 MIL/uL — ABNORMAL LOW (ref 4.22–5.81)
RDW: 13.7 % (ref 11.5–15.5)
WBC: 8.9 10*3/uL (ref 4.0–10.5)
nRBC: 0 % (ref 0.0–0.2)

## 2021-06-01 LAB — FERRITIN: Ferritin: 11 ng/mL — ABNORMAL LOW (ref 24–336)

## 2021-06-01 LAB — IRON AND TIBC
Iron: 35 ug/dL — ABNORMAL LOW (ref 45–182)
Saturation Ratios: 9 % — ABNORMAL LOW (ref 17.9–39.5)
TIBC: 409 ug/dL (ref 250–450)
UIBC: 374 ug/dL

## 2021-06-01 LAB — LACTATE DEHYDROGENASE: LDH: 152 U/L (ref 98–192)

## 2021-06-01 MED ORDER — IOHEXOL 300 MG/ML  SOLN
100.0000 mL | Freq: Once | INTRAMUSCULAR | Status: AC | PRN
  Administered 2021-06-01: 75 mL via INTRAVENOUS

## 2021-06-07 ENCOUNTER — Inpatient Hospital Stay (HOSPITAL_BASED_OUTPATIENT_CLINIC_OR_DEPARTMENT_OTHER): Payer: PPO | Admitting: Hematology

## 2021-06-07 ENCOUNTER — Other Ambulatory Visit: Payer: Self-pay

## 2021-06-07 VITALS — BP 115/70 | HR 87 | Temp 98.4°F | Resp 16 | Ht 69.0 in | Wt 233.5 lb

## 2021-06-07 DIAGNOSIS — R591 Generalized enlarged lymph nodes: Secondary | ICD-10-CM

## 2021-06-07 DIAGNOSIS — R0602 Shortness of breath: Secondary | ICD-10-CM | POA: Diagnosis not present

## 2021-06-07 DIAGNOSIS — R590 Localized enlarged lymph nodes: Secondary | ICD-10-CM | POA: Diagnosis not present

## 2021-06-07 DIAGNOSIS — R200 Anesthesia of skin: Secondary | ICD-10-CM | POA: Diagnosis not present

## 2021-06-07 DIAGNOSIS — F17210 Nicotine dependence, cigarettes, uncomplicated: Secondary | ICD-10-CM | POA: Diagnosis not present

## 2021-06-07 DIAGNOSIS — R1110 Vomiting, unspecified: Secondary | ICD-10-CM | POA: Diagnosis not present

## 2021-06-07 DIAGNOSIS — Z8249 Family history of ischemic heart disease and other diseases of the circulatory system: Secondary | ICD-10-CM | POA: Diagnosis not present

## 2021-06-07 DIAGNOSIS — Z823 Family history of stroke: Secondary | ICD-10-CM | POA: Diagnosis not present

## 2021-06-07 DIAGNOSIS — D500 Iron deficiency anemia secondary to blood loss (chronic): Secondary | ICD-10-CM | POA: Diagnosis not present

## 2021-06-07 DIAGNOSIS — Z79899 Other long term (current) drug therapy: Secondary | ICD-10-CM | POA: Diagnosis not present

## 2021-06-07 DIAGNOSIS — K118 Other diseases of salivary glands: Secondary | ICD-10-CM | POA: Diagnosis not present

## 2021-06-07 DIAGNOSIS — J320 Chronic maxillary sinusitis: Secondary | ICD-10-CM | POA: Diagnosis not present

## 2021-06-07 DIAGNOSIS — M47812 Spondylosis without myelopathy or radiculopathy, cervical region: Secondary | ICD-10-CM | POA: Diagnosis not present

## 2021-06-07 DIAGNOSIS — Z822 Family history of deafness and hearing loss: Secondary | ICD-10-CM | POA: Diagnosis not present

## 2021-06-07 DIAGNOSIS — Z8349 Family history of other endocrine, nutritional and metabolic diseases: Secondary | ICD-10-CM | POA: Diagnosis not present

## 2021-06-07 DIAGNOSIS — R109 Unspecified abdominal pain: Secondary | ICD-10-CM | POA: Diagnosis not present

## 2021-06-07 DIAGNOSIS — R059 Cough, unspecified: Secondary | ICD-10-CM | POA: Diagnosis not present

## 2021-06-07 DIAGNOSIS — Z800 Family history of malignant neoplasm of digestive organs: Secondary | ICD-10-CM | POA: Diagnosis not present

## 2021-06-07 DIAGNOSIS — R519 Headache, unspecified: Secondary | ICD-10-CM | POA: Diagnosis not present

## 2021-06-07 DIAGNOSIS — E119 Type 2 diabetes mellitus without complications: Secondary | ICD-10-CM | POA: Diagnosis not present

## 2021-06-07 DIAGNOSIS — Z833 Family history of diabetes mellitus: Secondary | ICD-10-CM | POA: Diagnosis not present

## 2021-06-07 DIAGNOSIS — R42 Dizziness and giddiness: Secondary | ICD-10-CM | POA: Diagnosis not present

## 2021-06-07 DIAGNOSIS — D509 Iron deficiency anemia, unspecified: Secondary | ICD-10-CM | POA: Diagnosis not present

## 2021-06-07 DIAGNOSIS — Z818 Family history of other mental and behavioral disorders: Secondary | ICD-10-CM | POA: Diagnosis not present

## 2021-06-07 DIAGNOSIS — Z888 Allergy status to other drugs, medicaments and biological substances status: Secondary | ICD-10-CM | POA: Diagnosis not present

## 2021-06-07 DIAGNOSIS — D110 Benign neoplasm of parotid gland: Secondary | ICD-10-CM | POA: Diagnosis not present

## 2021-06-07 DIAGNOSIS — G479 Sleep disorder, unspecified: Secondary | ICD-10-CM | POA: Diagnosis not present

## 2021-06-07 NOTE — Progress Notes (Signed)
[facility] [place], Stephen Roberts [id number]   CLINIC:  Medical Oncology/Hematology  PCP:  Default, Provider, MD None None   REASON FOR VISIT:  Follow-up for lymphadenopathy and parotid mass  PRIOR THERAPY: none  NGS Results: not done  CURRENT THERAPY: intermittent Venofer last on 08/25/2020  BRIEF ONCOLOGIC HISTORY:  Oncology History   No history exists.    CANCER STAGING:  Cancer Staging  No matching staging information was found for the patient.  INTERVAL HISTORY:  Stephen Roberts, a 56 y.o. male, returns for routine follow-up of his lymphadenopathy and parotid mass. [name] was last seen on 06/07/2021.   Today he reports feeling good. He reports fatigue since a Covid infection a couple of weeks ago, and lower abdominal pain. He also reports one episode of vomiting blood last week which has since resolved. He is taking 1 iron tablet daily. He denies hematuria and hematochezia. He denies constipation.   REVIEW OF SYSTEMS:  Review of Systems  Constitutional:  Positive for fatigue. Negative for appetite change.  Respiratory:  Positive for cough and shortness of breath.   Gastrointestinal:  Positive for abdominal pain (5/10 lower) and vomiting. Negative for blood in stool and constipation.  Genitourinary:  Negative for hematuria.   Neurological:  Positive for dizziness, headaches and numbness.  Psychiatric/Behavioral:  Positive for sleep disturbance.   All other systems reviewed and are negative.  PAST MEDICAL/SURGICAL HISTORY:  Past Medical History:  Diagnosis Date   Arthritis    Diabetes (HCC)    GERD (gastroesophageal reflux disease)    Heart murmur    Pneumonia 06/27/2006   Past Surgical History:  Procedure Laterality Date   ANTERIOR FUSION CERVICAL SPINE     COLONOSCOPY WITH PROPOFOL N/A 03/26/2020   Procedure: COLONOSCOPY WITH PROPOFOL;  Surgeon: Daneil Dolin, MD;  [facility];  Service: Endoscopy;  Laterality:  N/A;   disc in neck Left 05/2017   ESOPHAGEAL DILATION N/A 03/25/2020   Procedure: ESOPHAGEAL DILATION;  Surgeon: Rogene Houston, MD;  [facility];  Service: Endoscopy;  Laterality: N/A;   ESOPHAGOGASTRODUODENOSCOPY (EGD) WITH PROPOFOL N/A 03/25/2020   Procedure: ESOPHAGOGASTRODUODENOSCOPY (EGD) WITH PROPOFOL;  Surgeon: Rogene Houston, MD;  [facility];  Service: Endoscopy;  Laterality: N/A;   GIVENS CAPSULE STUDY N/A 04/13/2020   Procedure: GIVENS CAPSULE STUDY;  Surgeon: Daneil Dolin, MD;  [facility];  Service: Endoscopy;  Laterality: N/A;  7:30am   INSERTION OF MESH N/A 01/23/2013   Procedure: INSERTION OF MESH;  Surgeon: Adin Hector, MD;  Location: [facility];  Service: General;  Laterality: N/A;   POLYPECTOMY  03/26/2020   Procedure: POLYPECTOMY;  Surgeon: Daneil Dolin, MD;  [facility];  Service: Endoscopy;;   TONSILLECTOMY     as child   VENTRAL HERNIA REPAIR N/A 01/23/2013   Procedure: LAPAROSCOPIC MULTIPLE VENTRAL HERNIAS;  Surgeon: Adin Hector, MD;  Location: [facility];  Service: General;  Laterality: N/A;    SOCIAL HISTORY:  Social History   Socioeconomic History   Marital status: Married    Spouse name: Not on file   Number of children: Not on file   Years of education: Not on file   Highest education level: Not on file  Occupational History   Not on file  Tobacco Use   Smoking status: Every Day    Packs/day: 0.50    Years: 42.00    Pack years:  21.00    Types: Cigarettes    Start date: 1980   Smokeless tobacco: Never  Substance and Sexual Activity   Alcohol use: Yes    Comment: moderately   Drug use: Not Currently    Types: Cocaine    Comment: states he has tried it all. Last used cocaine a few months ago.    Sexual activity: Yes  Other Topics Concern   Not on file  Social History Narrative   Not on file   Social Determinants of Health   Financial Resource Strain: Not on file  Food Insecurity:  Not on file  Transportation Needs: Not on file  Physical Activity: Not on file  Stress: Not on file  Social Connections: Not on file  Intimate Partner Violence: Not on file    FAMILY HISTORY:  Family History  Problem Relation Age of Onset   Depression Mother    Hearing loss Mother    Hyperlipidemia Mother    Hypertension Mother    Stroke Mother    Early death Father    Diabetes Brother    Pancreatic cancer Paternal Uncle    Colon cancer Neg Hx     CURRENT MEDICATIONS:  Current Outpatient Medications  Medication Sig Dispense Refill   albuterol (VENTOLIN HFA) 108 (90 Base) MCG/ACT inhaler INHALE 2 PUFFS INTO THE LUNGS EVERY 6 HOURS AS NEEDED FOR WHEEZING OR SHORTNESS OF BREATH 18 g 0   Ascorbic Acid (VITAMIN C PO) Take by mouth.     ferrous sulfate 325 (65 FE) MG EC tablet Take 1 tablet (325 mg total) by mouth 2 (two) times daily. 60 tablet 3   Multiple Vitamin (MULTIVITAMIN) tablet Take 1 tablet by mouth daily.     Omega-3 Fatty Acids (OMEGA-3 PO) Take 500 mg by mouth daily.     oxyCODONE-acetaminophen (PERCOCET/ROXICET) 5-325 MG tablet Take by mouth every 8 (eight) hours as needed for severe pain.     pantoprazole (PROTONIX) 40 MG tablet Take 1 tablet (40 mg total) by mouth daily. 902 tablet 1   Turmeric 500 MG CAPS Take 500 mg by mouth daily.     vitamin B-12 (CYANOCOBALAMIN) 1000 MCG tablet Take 1,000 mcg by mouth daily.     No current facility-administered medications for this visit.   Facility-Administered Medications Ordered in Other Visits  Medication Dose Route Frequency Provider Last Rate Last Admin   0.9 %  sodium chloride infusion   Intravenous Once Pennington, Rebekah M, PA-C       iron sucrose (VENOFER) 200 mg in sodium chloride 0.9 % 100 mL IVPB  200 mg Intravenous Once Pennington, Rebekah M, PA-C        ALLERGIES:  Allergies  Allergen Reactions   Gabapentin Shortness Of Breath   Lyrica [Pregabalin] Shortness Of Breath    PHYSICAL EXAM:  Performance  status (ECOG): 1 - Symptomatic but completely ambulatory  Vitals:   06/07/21 1402  BP: 115/70  Pulse: 87  Resp: 16  Temp: 98.4 F (36.9 C)  SpO2: 97%   Wt Readings from Last 3 Encounters:  06/07/21 233 lb 7.5 oz (105.9 kg)  01/27/21 227 lb (103 kg)  10/22/20 227 lb 9.6 oz (103.2 kg)   Physical Exam Vitals reviewed.  Constitutional:      Appearance: Normal appearance.  Cardiovascular:     Rate and Rhythm: Normal rate and regular rhythm.     Pulses: Normal pulses.     Heart sounds: Normal heart sounds.  Pulmonary:     Effort:  Pulmonary effort is normal.     Breath sounds: Normal breath sounds.  Abdominal:     Palpations: Abdomen is soft. There is no hepatomegaly, splenomegaly or mass.     Tenderness: There is no abdominal tenderness.  Musculoskeletal:     Right lower leg: No edema.     Left lower leg: No edema.  Lymphadenopathy:     Cervical: No cervical adenopathy.     Right cervical: No superficial, deep or posterior cervical adenopathy.    Left cervical: No superficial, deep or posterior cervical adenopathy.     Upper Body:     Right upper body: No supraclavicular adenopathy.     Left upper body: No supraclavicular adenopathy.  Neurological:     General: No focal deficit present.     Mental Status: He is alert and oriented to person, place, and time.  Psychiatric:        Mood and Affect: Mood normal.        Behavior: Behavior normal.     LABORATORY DATA:  I have reviewed the labs as listed.  CBC Latest Ref Rng & Units 06/01/2021 01/20/2021 10/19/2020  WBC 4.0 - 10.5 K/uL 8.9 7.9 5.6  Hemoglobin 13.0 - 17.0 g/dL 13.5 15.2 15.7  Hematocrit 39.0 - 52.0 % 39.8 46.5 47.7  Platelets 150 - 400 K/uL 157 165 153   CMP Latest Ref Rng & Units 06/01/2021 01/20/2021 10/19/2020  Glucose 70 - 99 mg/dL 206(H) 140(H) 102(H)  BUN 6 - 20 mg/dL 23(H) 20 19  Creatinine 0.61 - 1.24 mg/dL 0.68 0.72 0.66  Sodium 135 - 145 mmol/L 134(L) 138 134(L)  Potassium 3.5 - 5.1 mmol/L 3.9 4.6 4.3   Chloride 98 - 111 mmol/L 101 103 101  CO2 22 - 32 mmol/L '22 27 25  ' Calcium 8.9 - 10.3 mg/dL 8.9 9.7 9.5  Total Protein 6.5 - 8.1 g/dL 7.6 8.0 7.9  Total Bilirubin 0.3 - 1.2 mg/dL 0.5 0.4 0.5  Alkaline Phos 38 - 126 U/L 117 122 107  AST 15 - 41 U/L '20 22 20  ' ALT 0 - 44 U/L '21 23 22    ' DIAGNOSTIC IMAGING:  I have independently reviewed the scans and discussed with the patient. CT SOFT TISSUE NECK W CONTRAST  Result Date: 06/02/2021 CLINICAL DATA:  Provided history: Lymphadenopathy and parotid neck mass surveillance. Additional history provided by technologist: Patient reports intermittent swelling of left parotid mass. EXAM: CT NECK WITH CONTRAST TECHNIQUE: Multidetector CT imaging of the neck was performed using the standard protocol following the bolus administration of intravenous contrast. RADIATION DOSE REDUCTION: This exam was performed according to the departmental dose-optimization program which includes automated exposure control, adjustment of the mA and/or kV according to patient size and/or use of iterative reconstruction technique. CONTRAST:  72m OMNIPAQUE IOHEXOL 300 MG/ML  SOLN COMPARISON:  Prior neck CT examinations 08/04/2020 and earlier. FINDINGS: Pharynx and larynx: Poor dentition. Streak and beam hardening artifact arising from dental restoration partially obscures the oral cavity. No appreciable swelling or discrete mass within the oral cavity, pharynx or larynx. Punctate calcific foci in the region of the palatine tonsils bilaterally compatible with postinflammatory calcifications or tonsilloliths. Salivary glands: A 16 x 15 x 18 mm soft tissue nodule within the inferior aspect of the left parotid gland, not significantly changed in size or appearance as compared to the prior neck CT of 10/19/2020. The right parotid and bilateral submandibular glands are unremarkable. Thyroid: Unremarkable. Lymph nodes: Unchanged mildly enlarged left level 2 lymph node  measuring 12 mm in short  axis (series 2, image 47) (series 5, image 80). Unchanged mildly enlarged right level 2 lymph node measuring 13 mm in short axis (series 2, image 59) (series 5, image 34). No other enlarged or suspicious lymph nodes are identified. Vascular: The major vascular structures of the neck are patent. Mild atherosclerotic plaque within the visualized aortic arch and about the carotid bifurcations. Limited intracranial: No evidence of acute intracranial abnormality within the field of view. Visualized orbits: Incompletely imaged. No orbital mass or acute orbital finding at the imaged levels. Mastoids and visualized paranasal sinuses: Mild mucosal thickening within the bilateral maxillary sinuses. Superimposed 13 mm mucous retention cyst within the inferior left maxillary sinus. No significant mastoid effusion. Skeleton: Prior C3-C4 disc arthroplasty. Unchanged from the prior exam of 10/19/2020, there may be some anterior displacement of the device. Redemonstrated sequela of prior C5-C7 ACDF. Superimposed cervical spondylosis. Upper chest: No consolidation within the imaged lung apices. IMPRESSION: No significant interval change as compared to the prior neck CT of 10/19/2020. Unchanged 16 x 15 x 18 mm nodule within the inferior aspect of the left parotid gland. This may reflect a primary parotid neoplasm or abnormal, enlarged intraparotid lymph node. Unchanged mildly enlarged bilateral level 2 lymph nodes (measuring up to 13 mm in short axis). Mild bilateral maxillary sinus disease, as described. Electronically Signed   By: Kellie Simmering D.O.   On: 06/02/2021 10:42     ASSESSMENT:  1.  Left parotid mass and lymphadenopathy -Patient has had ongoing left-sided neck lymphadenopathy that waxes and wanes, also associated with axillary and inguinal tenderness but without discernible lymphadenopathy -Persistent fatigue since December 2021, no significant weight loss or other B symptoms -Autoimmune screen (RF, ANA) negative;  mildly elevated ESR 28, CRP 1.2; HIV nonreactive, CLL FISH negative; hepatitis panel negative, LDH within normal limits at 170 -Most recent CBC (07/22/2020) within normal limits -CT soft tissue neck (08/04/2020): 17 x 15 x 13 mm mass or lymph node in the inferior aspect of the left parotid gland as well as enlarged level 1 lymph node on the left measuring 15 x 14 x 14 mm; no other asymmetric or pathologically enlarged lymph nodes in neck, but several symmetric lymphadenopathy in neck as described in full radiology report -CT chest (08/04/2020) showed no evidence of thoracic adenopathy or primary malignancy within the chest - Biopsy on 08/17/2020-left parotid nodule consistent with Warthin's tumor.  Left cervical lymph node biopsy showed scant nodal tissue fragments without morphologic/immunophenotypic evidence of lymphoproliferative disorder.   2.  Iron deficiency anemia secondary to occult GI bleed and malabsorption -Patient was hospitalized in November 2021 due to melena and anemia, transfused RBC x2 -Was seen by GI, suspected occult small bowel bleed related to NSAID use, although no definite source of bleeding was found on EGD or colonoscopy -Hemoglobin within normal limits at 14.5 (07/22/2020) -Iron panel (07/23/2019) shows serum iron 42, percent saturation, ferritin 17 -Likely malabsorption of iron in the setting of concurrent PPI use   3.  Social history -Patient reports every day tobacco use (0.5 to 1 pack/day cigarettes), weekly alcohol use (4-5 beers per week); infrequent illicit drug use (cocaine) -Patient lives at home with his wife, is on disability   PLAN:  1.  Left parotid Warthin's tumor: - CT soft tissue neck showed unchanged 16 x 15 x 18 mm nodule within the inferior aspect of the left parotid gland. - Continue follow-up with Dr. Benjamine Mola.   2.  Iron deficiency anemia  secondary to occult GI bleed - We have reviewed his labs from 06/01/2021.  Ferritin is 11 and percent saturation 9.   Hemoglobin 13.5. - He is taking iron tablet daily.  He reports severe fatigue. - Recommend weekly Feraheme x2. - RTC 4 months with repeat labs.  3.  Bilateral neck adenopathy: - He does not report any B symptoms. - Reviewed CT soft tissue neck from 06/02/2021.  Unchanged mildly enlarged bilateral level 2 lymph nodes.   Orders placed this encounter:  No orders of the defined types were placed in this encounter.    Derek Jack, MD [place] [phone]   I, Thana Ates, am acting as a scribe for Dr. Derek Jack.  I, Derek Jack MD, have reviewed the above documentation for accuracy and completeness, and I agree with the above.

## 2021-06-07 NOTE — Patient Instructions (Signed)
[place] Discharge Instructions  You were seen and examined today by Dr. Delton Coombes. He reviewed your most recent labs and scan. The scan is stable and your blood work shows that your iron is low and your sugar is elevated. We will get you scheduled for IV iron.  Please keep follow up appointment as scheduled in 4 months.    Thank you for choosing [place] at [facility] to provide your oncology and hematology care.  To afford each patient quality time with our provider, please arrive at least 15 minutes before your scheduled appointment time.   If you have a lab appointment with the [facility] please come in thru the Main Entrance and check in at the main information desk.  You need to re-schedule your appointment should you arrive 10 or more minutes late.  We strive to give you quality time with our providers, and arriving late affects you and other patients whose appointments are after yours.  Also, if you no show three or more times for appointments you may be dismissed from the clinic at the providers discretion.     Again, thank you for choosing [facility].  Our hope is that these requests will decrease the amount of time that you wait before being seen by our physicians.       _____________________________________________________________  Should you have questions after your visit to [facility], please contact our office at [phone] and follow the prompts.  Our office hours are 8:00 a.m. and 4:30 p.m. Monday - Friday.  Please note that voicemails left after 4:00 p.m. may not be returned until the following business day.  We are closed weekends and major holidays.  You do have access to a nurse 24-7, just call the main number to the clinic [phone] and do not press any options, hold on the line and a nurse will answer the phone.    For prescription refill requests, have your pharmacy  contact our office and allow 72 hours.    Due to Covid, you will need to wear a mask upon entering the hospital. If you do not have a mask, a mask will be given to you at the Main Entrance upon arrival. For doctor visits, patients may have 1 support person age 78 or older with them. For treatment visits, patients can not have anyone with them due to social distancing guidelines and our immunocompromised population.

## 2021-06-11 ENCOUNTER — Inpatient Hospital Stay (HOSPITAL_COMMUNITY): Payer: PPO

## 2021-06-11 VITALS — BP 125/81 | HR 73 | Temp 98.2°F | Resp 18

## 2021-06-11 DIAGNOSIS — D500 Iron deficiency anemia secondary to blood loss (chronic): Secondary | ICD-10-CM

## 2021-06-11 DIAGNOSIS — D110 Benign neoplasm of parotid gland: Secondary | ICD-10-CM | POA: Diagnosis not present

## 2021-06-11 MED ORDER — SODIUM CHLORIDE 0.9 % IV SOLN
510.0000 mg | Freq: Once | INTRAVENOUS | Status: AC
  Administered 2021-06-11: 510 mg via INTRAVENOUS
  Filled 2021-06-11: qty 510

## 2021-06-11 MED ORDER — ACETAMINOPHEN 325 MG PO TABS
650.0000 mg | ORAL_TABLET | Freq: Once | ORAL | Status: AC
  Administered 2021-06-11: 650 mg via ORAL
  Filled 2021-06-11: qty 2

## 2021-06-11 MED ORDER — SODIUM CHLORIDE 0.9 % IV SOLN: Freq: Once | INTRAVENOUS | Status: AC

## 2021-06-11 MED ORDER — LORATADINE 10 MG PO TABS
10.0000 mg | ORAL_TABLET | Freq: Once | ORAL | Status: AC
  Administered 2021-06-11: 10 mg via ORAL
  Filled 2021-06-11: qty 1

## 2021-06-11 NOTE — Progress Notes (Signed)
Patient presents today for Feraheme infusion per providers order.  Vital signe WNL.  Patient has no new complaints at this time.    Peripheral IV started and blood return noted pre and post infusion.  Feraheme infusion given today per MD orders.  Stable during infusion without adverse affects.  Vital signs stable.  No complaints at this time.  Patient refused to wait the thirty minute wait post infusion wait time.  Discharge from clinic ambulatory in stable condition.  Alert and oriented X 3.  Follow up with [facility] as scheduled.

## 2021-06-11 NOTE — Progress Notes (Signed)
Chaplain engaged in an initial visit with Stephen Roberts.  Stephen Roberts shared about his grandchildren and health.  He is a proud grandfather. He is hoping to be able to go with his family at the end of the month to [place].  He voiced that sometimes he feels great and sometimes he feels like he is 56 years old.  Though he expressed some challenges in energy and in his body, Stephen Roberts appears to have a great support system and utilizes humor to get through it.   Chaplain offered listening, presence, and support.     06/11/21 1000  Clinical Encounter Type  Visited With Patient  Visit Type Initial

## 2021-06-11 NOTE — Patient Instructions (Signed)
[facility]  Discharge Instructions: °Thank you for choosing [facility] to provide your oncology and hematology care.  °If you have a lab appointment with the Cancer Center, please come in thru the Main Entrance and check in at the main information desk. ° °Wear comfortable clothing and clothing appropriate for easy access to any Portacath or PICC line.  ° °We strive to give you quality time with your provider. You may need to reschedule your appointment if you arrive late (15 or more minutes).  Arriving late affects you and other patients whose appointments are after yours.  Also, if you miss three or more appointments without notifying the office, you may be dismissed from the clinic at the provider’s discretion.    °  °For prescription refill requests, have your pharmacy contact our office and allow 72 hours for refills to be completed.   ° °Today you received the following chemotherapy and/or immunotherapy agents Feraheme    °  °To help prevent nausea and vomiting after your treatment, we encourage you to take your nausea medication as directed. ° °BELOW ARE SYMPTOMS THAT SHOULD BE REPORTED IMMEDIATELY: °*FEVER GREATER THAN 100.4 F (38 °C) OR HIGHER °*CHILLS OR SWEATING °*NAUSEA AND VOMITING THAT IS NOT CONTROLLED WITH YOUR NAUSEA MEDICATION °*UNUSUAL SHORTNESS OF BREATH °*UNUSUAL BRUISING OR BLEEDING °*URINARY PROBLEMS (pain or burning when urinating, or frequent urination) °*BOWEL PROBLEMS (unusual diarrhea, constipation, pain near the anus) °TENDERNESS IN MOUTH AND THROAT WITH OR WITHOUT PRESENCE OF ULCERS (sore throat, sores in mouth, or a toothache) °UNUSUAL RASH, SWELLING OR PAIN  °UNUSUAL VAGINAL DISCHARGE OR ITCHING  ° °Items with * indicate a potential emergency and should be followed up as soon as possible or go to the Emergency Department if any problems should occur. ° °Please show the CHEMOTHERAPY ALERT CARD or IMMUNOTHERAPY ALERT CARD at check-in to the Emergency  Department and triage nurse. ° °Should you have questions after your visit or need to cancel or reschedule your appointment, please contact [facility] [phone]  and follow the prompts.  Office hours are 8:00 a.m. to 4:30 p.m. Monday - Friday. Please note that voicemails left after 4:00 p.m. may not be returned until the following business day.  We are closed weekends and major holidays. You have access to a nurse at all times for urgent questions. Please call the main number to the clinic [phone] and follow the prompts. ° °For any non-urgent questions, you may also contact your provider using MyChart. We now offer e-Visits for anyone 18 and older to request care online for non-urgent symptoms. For details visit [email or web address]. °  °Also download the MyChart app! Go to the app store, search "MyChart", open the app, select [facility], and log in with your MyChart username and password. ° °Due to Covid, a mask is required upon entering the hospital/clinic. If you do not have a mask, one will be given to you upon arrival. For doctor visits, patients may have 1 support person aged 18 or older with them. For treatment visits, patients cannot have anyone with them due to current Covid guidelines and our immunocompromised population.  °

## 2021-06-18 ENCOUNTER — Other Ambulatory Visit: Payer: Self-pay

## 2021-06-18 ENCOUNTER — Inpatient Hospital Stay (HOSPITAL_COMMUNITY): Payer: PPO | Attending: Hematology

## 2021-06-18 VITALS — BP 115/73 | HR 77 | Temp 98.4°F | Resp 18

## 2021-06-18 DIAGNOSIS — Z79899 Other long term (current) drug therapy: Secondary | ICD-10-CM | POA: Insufficient documentation

## 2021-06-18 DIAGNOSIS — D110 Benign neoplasm of parotid gland: Secondary | ICD-10-CM | POA: Diagnosis not present

## 2021-06-18 DIAGNOSIS — D500 Iron deficiency anemia secondary to blood loss (chronic): Secondary | ICD-10-CM | POA: Diagnosis not present

## 2021-06-18 DIAGNOSIS — K922 Gastrointestinal hemorrhage, unspecified: Secondary | ICD-10-CM | POA: Insufficient documentation

## 2021-06-18 DIAGNOSIS — R590 Localized enlarged lymph nodes: Secondary | ICD-10-CM | POA: Insufficient documentation

## 2021-06-18 MED ORDER — LORATADINE 10 MG PO TABS
10.0000 mg | ORAL_TABLET | Freq: Once | ORAL | Status: AC
  Administered 2021-06-18: 10 mg via ORAL
  Filled 2021-06-18: qty 1

## 2021-06-18 MED ORDER — SODIUM CHLORIDE 0.9 % IV SOLN
510.0000 mg | Freq: Once | INTRAVENOUS | Status: AC
  Administered 2021-06-18: 510 mg via INTRAVENOUS
  Filled 2021-06-18: qty 510

## 2021-06-18 MED ORDER — SODIUM CHLORIDE 0.9 % IV SOLN: Freq: Once | INTRAVENOUS | Status: AC

## 2021-06-18 MED ORDER — ACETAMINOPHEN 325 MG PO TABS
650.0000 mg | ORAL_TABLET | Freq: Once | ORAL | Status: AC
  Administered 2021-06-18: 650 mg via ORAL
  Filled 2021-06-18: qty 2

## 2021-06-18 NOTE — Patient Instructions (Signed)
[facility]  Discharge Instructions: ?Thank you for choosing [facility] to provide your oncology and hematology care.  ?If you have a lab appointment with the Cancer Center, please come in thru the Main Entrance and check in at the main information desk. ? ?Wear comfortable clothing and clothing appropriate for easy access to any Portacath or PICC line.  ? ?We strive to give you quality time with your provider. You may need to reschedule your appointment if you arrive late (15 or more minutes).  Arriving late affects you and other patients whose appointments are after yours.  Also, if you miss three or more appointments without notifying the office, you may be dismissed from the clinic at the provider?s discretion.    ?  ?For prescription refill requests, have your pharmacy contact our office and allow 72 hours for refills to be completed.   ? ?Today you received the following Feraheme, return as scheduled. ?  ?To help prevent nausea and vomiting after your treatment, we encourage you to take your nausea medication as directed. ? ?BELOW ARE SYMPTOMS THAT SHOULD BE REPORTED IMMEDIATELY: ?*FEVER GREATER THAN 100.4 F (38 ?C) OR HIGHER ?*CHILLS OR SWEATING ?*NAUSEA AND VOMITING THAT IS NOT CONTROLLED WITH YOUR NAUSEA MEDICATION ?*UNUSUAL SHORTNESS OF BREATH ?*UNUSUAL BRUISING OR BLEEDING ?*URINARY PROBLEMS (pain or burning when urinating, or frequent urination) ?*BOWEL PROBLEMS (unusual diarrhea, constipation, pain near the anus) ?TENDERNESS IN MOUTH AND THROAT WITH OR WITHOUT PRESENCE OF ULCERS (sore throat, sores in mouth, or a toothache) ?UNUSUAL RASH, SWELLING OR PAIN  ?UNUSUAL VAGINAL DISCHARGE OR ITCHING  ? ?Items with * indicate a potential emergency and should be followed up as soon as possible or go to the Emergency Department if any problems should occur. ? ?Please show the CHEMOTHERAPY ALERT CARD or IMMUNOTHERAPY ALERT CARD at check-in to the Emergency Department and triage  nurse. ? ?Should you have questions after your visit or need to cancel or reschedule your appointment, please contact [facility] [phone]  and follow the prompts.  Office hours are 8:00 a.m. to 4:30 p.m. Monday - Friday. Please note that voicemails left after 4:00 p.m. may not be returned until the following business day.  We are closed weekends and major holidays. You have access to a nurse at all times for urgent questions. Please call the main number to the clinic [phone] and follow the prompts. ? ?For any non-urgent questions, you may also contact your provider using MyChart. We now offer e-Visits for anyone 18 and older to request care online for non-urgent symptoms. For details visit [email or web address]. ?  ?Also download the MyChart app! Go to the app store, search "MyChart", open the app, select [facility], and log in with your MyChart username and password. ? ?Due to Covid, a mask is required upon entering the hospital/clinic. If you do not have a mask, one will be given to you upon arrival. For doctor visits, patients may have 1 support person aged 18 or older with them. For treatment visits, patients cannot have anyone with them due to current Covid guidelines and our immunocompromised population.  ?

## 2021-06-18 NOTE — Progress Notes (Signed)
Patient tolerated iron infusion with no complaints voiced.  Peripheral IV site clean and dry with good blood return noted before and after infusion.  Band aid applied.  VSS with discharge and left in satisfactory condition with no s/s of distress noted.   

## 2021-06-29 DIAGNOSIS — D37030 Neoplasm of uncertain behavior of the parotid salivary glands: Secondary | ICD-10-CM | POA: Diagnosis not present

## 2021-06-29 DIAGNOSIS — K1123 Chronic sialoadenitis: Secondary | ICD-10-CM | POA: Diagnosis not present

## 2021-06-30 ENCOUNTER — Ambulatory Visit: Payer: PPO | Admitting: Internal Medicine

## 2021-06-30 ENCOUNTER — Ambulatory Visit (INDEPENDENT_AMBULATORY_CARE_PROVIDER_SITE_OTHER): Payer: PPO | Admitting: Internal Medicine

## 2021-06-30 ENCOUNTER — Telehealth: Payer: PPO | Admitting: Internal Medicine

## 2021-06-30 ENCOUNTER — Ambulatory Visit: Payer: PPO

## 2021-06-30 ENCOUNTER — Other Ambulatory Visit: Payer: Self-pay

## 2021-06-30 ENCOUNTER — Encounter: Payer: Self-pay | Admitting: Internal Medicine

## 2021-06-30 VITALS — BP 124/82 | Resp 18 | Ht 69.0 in

## 2021-06-30 DIAGNOSIS — K118 Other diseases of salivary glands: Secondary | ICD-10-CM | POA: Diagnosis not present

## 2021-06-30 DIAGNOSIS — E782 Mixed hyperlipidemia: Secondary | ICD-10-CM | POA: Diagnosis not present

## 2021-06-30 DIAGNOSIS — K219 Gastro-esophageal reflux disease without esophagitis: Secondary | ICD-10-CM

## 2021-06-30 DIAGNOSIS — Z720 Tobacco use: Secondary | ICD-10-CM

## 2021-06-30 DIAGNOSIS — R059 Cough, unspecified: Secondary | ICD-10-CM

## 2021-06-30 DIAGNOSIS — Z981 Arthrodesis status: Secondary | ICD-10-CM | POA: Diagnosis not present

## 2021-06-30 DIAGNOSIS — E1169 Type 2 diabetes mellitus with other specified complication: Secondary | ICD-10-CM

## 2021-06-30 DIAGNOSIS — D500 Iron deficiency anemia secondary to blood loss (chronic): Secondary | ICD-10-CM

## 2021-06-30 DIAGNOSIS — Z20822 Contact with and (suspected) exposure to covid-19: Secondary | ICD-10-CM

## 2021-06-30 DIAGNOSIS — J449 Chronic obstructive pulmonary disease, unspecified: Secondary | ICD-10-CM

## 2021-06-30 NOTE — Assessment & Plan Note (Signed)
Has chronic neck and back pain Used to take NSAIDs, likely related to PUD Takes Percocet as needed, not on daily basis - gets about 60 count in a year 

## 2021-06-30 NOTE — Assessment & Plan Note (Signed)
Lab Results  Component Value Date   HGBA1C 6.4 (H) 07/17/2020    Diet controlled, was on Metformin in the past Advised to follow diabetic diet F/u CMP and lipid panel Diabetic eye exam: Advised to follow up with Ophthalmology for diabetic eye exam

## 2021-06-30 NOTE — Assessment & Plan Note (Signed)
Takes pantoprazole

## 2021-06-30 NOTE — Assessment & Plan Note (Signed)
Smokes about 1 pack/day  Asked about quitting: confirms that he/she currently smokes cigarettes Advise to quit smoking: Educated about QUITTING to reduce the risk of cancer, cardio and cerebrovascular disease. Assess willingness: Unwilling to quit at this time, but is working on cutting back. Assist with counseling and pharmacotherapy: Counseled for 5 minutes and literature provided. Arrange for follow up: follow up in 3 months and continue to offer help. 

## 2021-06-30 NOTE — Assessment & Plan Note (Signed)
Followed by hematology clinic, gets Feraheme infusions Denies any active signs of bleeding

## 2021-06-30 NOTE — Patient Instructions (Signed)
Please take Mucinex or Robitussin for cough.  Please continue to maintain adequate hydration.

## 2021-06-30 NOTE — Assessment & Plan Note (Signed)
Will check lipid profile in the next visit

## 2021-06-30 NOTE — Assessment & Plan Note (Signed)
Followed by Dr. Delton Coombes and Dr. Benjamine Mola, on surveillance

## 2021-06-30 NOTE — Progress Notes (Signed)
New Patient Office Visit  Subjective:  Patient ID: Stephen Roberts, male    DOB: Dec 13, 1965  Age: 56 y.o. MRN: [id number]  CC:  Chief Complaint  Patient presents with   New Patient (Initial Visit)    New patient was seeing [facility] has covid symptoms daughter tested pos 06-29-21 patient started having symptoms 06-28-21 has cough headache bodyaches runny nose     HPI Stephen Roberts is a 56 y.o. male with past medical history of left parotid mass, IDA, type II DM, HLD, chronic neck pain and tobacco abuse who presents for establishing care.  He complains of cough, nasal congestion, fatigue and myalgias for the last 3 days.  His daughter and granddaughter tested positive for COVID yesterday.  He currently denies any fever, chills, dyspnea or wheezing.  He sees Dr. Delton Coombes and Dr. Benjamine Mola for history of left parotid mass, which is unchanged in size.  He denies any weight loss, night sweats or difficulty in chewing currently.  He takes pantoprazole for GERD.  Denies any epigastric pain, nausea or odynophagia currently.  He has history of chronic neck pain, and has had cervical spinal fusion surgery by Dr. Annette Stable.  He takes Percocet as needed for severe pain.  He currently denies any new change in sensation or weakness of the UE.  He has had 2 doses of COVID vaccines.  Past Medical History:  Diagnosis Date   Arthritis    Diabetes ([place])    GERD (gastroesophageal reflux disease)    Heart murmur    Pneumonia 06/27/2006    Past Surgical History:  Procedure Laterality Date   ANTERIOR FUSION CERVICAL SPINE     COLONOSCOPY WITH PROPOFOL N/A 03/26/2020   Procedure: COLONOSCOPY WITH PROPOFOL;  Surgeon: Daneil Dolin, MD;  [facility];  Service: Endoscopy;  Laterality: N/A;   disc in neck Left 05/2017   ESOPHAGEAL DILATION N/A 03/25/2020   Procedure: ESOPHAGEAL DILATION;  Surgeon: Rogene Houston, MD;  [facility];  Service: Endoscopy;   Laterality: N/A;   ESOPHAGOGASTRODUODENOSCOPY (EGD) WITH PROPOFOL N/A 03/25/2020   Procedure: ESOPHAGOGASTRODUODENOSCOPY (EGD) WITH PROPOFOL;  Surgeon: Rogene Houston, MD;  [facility];  Service: Endoscopy;  Laterality: N/A;   GIVENS CAPSULE STUDY N/A 04/13/2020   Procedure: GIVENS CAPSULE STUDY;  Surgeon: Daneil Dolin, MD;  [facility];  Service: Endoscopy;  Laterality: N/A;  7:30am   INSERTION OF MESH N/A 01/23/2013   Procedure: INSERTION OF MESH;  Surgeon: Adin Hector, MD;  Location: [facility];  Service: General;  Laterality: N/A;   POLYPECTOMY  03/26/2020   Procedure: POLYPECTOMY;  Surgeon: Daneil Dolin, MD;  [facility];  Service: Endoscopy;;   TONSILLECTOMY     as child   VENTRAL HERNIA REPAIR N/A 01/23/2013   Procedure: LAPAROSCOPIC MULTIPLE VENTRAL HERNIAS;  Surgeon: Adin Hector, MD;  Location: [facility];  Service: General;  Laterality: N/A;    Family History  Problem Relation Age of Onset   Depression Mother    Hearing loss Mother    Hyperlipidemia Mother    Hypertension Mother    Stroke Mother    Early death Father    Diabetes Brother    Pancreatic cancer Paternal Uncle    Colon cancer Neg Hx     Social History   Socioeconomic History   Marital status: Married    Spouse name: Not on file   Number of children: Not on  file   Years of education: Not on file   Highest education level: Not on file  Occupational History   Not on file  Tobacco Use   Smoking status: Every Day    Packs/day: 0.50    Years: 42.00    Pack years: 21.00    Types: Cigarettes    Start date: 75   Smokeless tobacco: Never  Substance and Sexual Activity   Alcohol use: Yes    Comment: moderately   Drug use: Not Currently    Types: Cocaine    Comment: states he has tried it all. Last used cocaine a few months ago.    Sexual activity: Yes  Other Topics Concern   Not on file  Social History Narrative   Not on file   Social Determinants of  Health   Financial Resource Strain: Not on file  Food Insecurity: Not on file  Transportation Needs: Not on file  Physical Activity: Not on file  Stress: Not on file  Social Connections: Not on file  Intimate Partner Violence: Not on file    ROS Review of Systems  Constitutional:  Negative for chills and fever.  HENT:  Positive for congestion and sore throat.   Eyes:  Negative for pain and discharge.  Respiratory:  Positive for cough. Negative for shortness of breath.   Cardiovascular:  Negative for chest pain and palpitations.  Gastrointestinal:  Negative for diarrhea, nausea and vomiting.  Endocrine: Negative for polydipsia and polyuria.  Genitourinary:  Negative for dysuria and hematuria.  Musculoskeletal:  Positive for back pain and neck pain. Negative for neck stiffness.  Skin:  Negative for rash.  Neurological:  Negative for dizziness, weakness, numbness and headaches.  Psychiatric/Behavioral:  Negative for agitation and behavioral problems.    Objective:   Today's Vitals: BP 124/82 (BP Location: Right Arm, Patient Position: Sitting, Cuff Size: Normal)    Resp 18    Ht 5\' 9"  (1.753 m)    BMI 34.48 kg/m   Physical Exam Vitals reviewed.  Constitutional:      General: He is not in acute distress.    Appearance: He is not diaphoretic.  HENT:     Head: Normocephalic and atraumatic.     Comments: Enlarged left parotid gland    Nose: Congestion present.     Mouth/Throat:     Mouth: Mucous membranes are moist.  Eyes:     General: No scleral icterus.    Extraocular Movements: Extraocular movements intact.  Cardiovascular:     Rate and Rhythm: Normal rate and regular rhythm.     Pulses: Normal pulses.     Heart sounds: Normal heart sounds. No murmur heard. Pulmonary:     Breath sounds: Normal breath sounds. No wheezing or rales.  Musculoskeletal:     Cervical back: Neck supple. No tenderness.     Right lower leg: No edema.     Left lower leg: No edema.  Skin:     General: Skin is warm.     Findings: No rash.  Neurological:     General: No focal deficit present.     Mental Status: He is alert and oriented to person, place, and time.  Psychiatric:        Mood and Affect: Mood normal.        Behavior: Behavior normal.    Assessment & Plan:   Problem List Items Addressed This Visit       Respiratory   Chronic obstructive pulmonary disease ([place])  Uses albuterol as needed for dyspnea or wheezing, does not need it daily If frequent use needed, will add maintenance inhaler for COPD        Digestive   GERD (gastroesophageal reflux disease)    Takes pantoprazole        Endocrine   Type 2 diabetes mellitus ([place])    Lab Results  Component Value Date   HGBA1C 6.4 (H) 07/17/2020   Diet controlled, was on Metformin in the past Advised to follow diabetic diet F/u CMP and lipid panel Diabetic eye exam: Advised to follow up with Ophthalmology for diabetic eye exam        Other   Tobacco abuse    Smokes about 1 pack/day  Asked about quitting: confirms that he/she currently smokes cigarettes Advise to quit smoking: Educated about QUITTING to reduce the risk of cancer, cardio and cerebrovascular disease. Assess willingness: Unwilling to quit at this time, but is working on cutting back. Assist with counseling and pharmacotherapy: Counseled for 5 minutes and literature provided. Arrange for follow up: follow up in 3 months and continue to offer help.      Hyperlipidemia    Will check lipid profile in the next visit      Iron deficiency anemia due to chronic blood loss    Followed by hematology clinic, gets Feraheme infusions Denies any active signs of bleeding      Mass of left parotid gland    Followed by Dr. Delton Coombes and Dr. Benjamine Mola, on surveillance      S/P cervical spinal fusion    Has chronic neck and back pain Used to take NSAIDs, likely related to PUD Takes Percocet as needed, not on daily basis - gets about 60 count in a  year      Other Visit Diagnoses     Suspected COVID-19 virus infection    -  Primary   Relevant Orders   Novel Coronavirus, NAA ([facility])       Outpatient Encounter Medications as of 06/30/2021  Medication Sig   albuterol (VENTOLIN HFA) 108 (90 Base) MCG/ACT inhaler INHALE 2 PUFFS INTO THE LUNGS EVERY 6 HOURS AS NEEDED FOR WHEEZING OR SHORTNESS OF BREATH   Ascorbic Acid (VITAMIN C PO) Take by mouth.   ferrous sulfate 325 (65 FE) MG EC tablet Take 1 tablet (325 mg total) by mouth 2 (two) times daily.   Multiple Vitamin (MULTIVITAMIN) tablet Take 1 tablet by mouth daily.   Omega-3 Fatty Acids (OMEGA-3 PO) Take 500 mg by mouth daily.   oxyCODONE-acetaminophen (PERCOCET/ROXICET) 5-325 MG tablet Take by mouth every 8 (eight) hours as needed for severe pain.   pantoprazole (PROTONIX) 40 MG tablet Take 1 tablet (40 mg total) by mouth daily.   Turmeric 500 MG CAPS Take 500 mg by mouth daily.   vitamin B-12 (CYANOCOBALAMIN) 1000 MCG tablet Take 1,000 mcg by mouth daily.   Facility-Administered Encounter Medications as of 06/30/2021  Medication   0.9 %  sodium chloride infusion   iron sucrose (VENOFER) 200 mg in sodium chloride 0.9 % 100 mL IVPB    Follow-up: Return in about 3 months (around 09/27/2021) for Annual physical.   Lindell Spar, MD

## 2021-06-30 NOTE — Assessment & Plan Note (Addendum)
Uses albuterol as needed for dyspnea or wheezing, does not need it daily If frequent use needed, will add maintenance inhaler for COPD 

## 2021-07-02 LAB — NOVEL CORONAVIRUS, NAA: SARS-CoV-2, NAA: NOT DETECTED

## 2021-07-21 ENCOUNTER — Other Ambulatory Visit: Payer: Self-pay

## 2021-07-21 ENCOUNTER — Ambulatory Visit
Admission: EM | Admit: 2021-07-21 | Discharge: 2021-07-21 | Disposition: A | Discharge status: Home / Self Care | Payer: PPO | Attending: Urgent Care | Admitting: Urgent Care

## 2021-07-21 DIAGNOSIS — F17200 Nicotine dependence, unspecified, uncomplicated: Secondary | ICD-10-CM | POA: Diagnosis not present

## 2021-07-21 DIAGNOSIS — J309 Allergic rhinitis, unspecified: Secondary | ICD-10-CM | POA: Diagnosis not present

## 2021-07-21 DIAGNOSIS — J0191 Acute recurrent sinusitis, unspecified: Secondary | ICD-10-CM

## 2021-07-21 DIAGNOSIS — E119 Type 2 diabetes mellitus without complications: Secondary | ICD-10-CM

## 2021-07-21 DIAGNOSIS — J449 Chronic obstructive pulmonary disease, unspecified: Secondary | ICD-10-CM | POA: Diagnosis not present

## 2021-07-21 MED ORDER — PROMETHAZINE-DM 6.25-15 MG/5ML PO SYRP: 5.0000 mL | ORAL_SOLUTION | Freq: Every evening | ORAL | 0 refills | Status: DC | PRN

## 2021-07-21 MED ORDER — AMOXICILLIN-POT CLAVULANATE 875-125 MG PO TABS: 1.0000 | ORAL_TABLET | Freq: Two times a day (BID) | ORAL | 0 refills | Status: DC

## 2021-07-21 MED ORDER — PREDNISONE 50 MG PO TABS: 50.0000 mg | ORAL_TABLET | Freq: Every day | ORAL | 0 refills | Status: DC

## 2021-07-21 MED ORDER — BENZONATATE 100 MG PO CAPS: 100.0000 mg | ORAL_CAPSULE | Freq: Three times a day (TID) | ORAL | 0 refills | Status: DC | PRN

## 2021-07-21 NOTE — ED Provider Notes (Signed)
?[name] ? ? ?MRN: [id number] DOB: May 21, 1965 ? ?Subjective:  ? ?Stephen Roberts is a 56 y.o. male presenting for 2-week history of persistent and worsening malaise.  Symptoms started out with a cough and sinus congestion.  He had interim improvement but then had the return of significant sinus pressure, intermittent sinus headaches, postnasal drainage, throat pain and coughing.  Cough is worse at night.  No chest pain, shortness of breath or wheezing.  He does have a history of allergic rhinitis and is not consistent with his medications.  He also has a history of COPD, uses albuterol inhaler as needed.  Has a history of diabetes and is currently managed with diet only.  No history of heart disease or heart attack. ? ?No current facility-administered medications for this encounter. ? ?Current Outpatient Medications:  ?  albuterol (VENTOLIN HFA) 108 (90 Base) MCG/ACT inhaler, INHALE 2 PUFFS INTO THE LUNGS EVERY 6 HOURS AS NEEDED FOR WHEEZING OR SHORTNESS OF BREATH, Disp: 18 g, Rfl: 0 ?  Ascorbic Acid (VITAMIN C PO), Take by mouth., Disp: , Rfl:  ?  ferrous sulfate 325 (65 FE) MG EC tablet, Take 1 tablet (325 mg total) by mouth 2 (two) times daily., Disp: 60 tablet, Rfl: 3 ?  Multiple Vitamin (MULTIVITAMIN) tablet, Take 1 tablet by mouth daily., Disp: , Rfl:  ?  Omega-3 Fatty Acids (OMEGA-3 PO), Take 500 mg by mouth daily., Disp: , Rfl:  ?  oxyCODONE-acetaminophen (PERCOCET/ROXICET) 5-325 MG tablet, Take by mouth every 8 (eight) hours as needed for severe pain., Disp: , Rfl:  ?  pantoprazole (PROTONIX) 40 MG tablet, Take 1 tablet (40 mg total) by mouth daily., Disp: 902 tablet, Rfl: 1 ?  Turmeric 500 MG CAPS, Take 500 mg by mouth daily., Disp: , Rfl:  ?  vitamin B-12 (CYANOCOBALAMIN) 1000 MCG tablet, Take 1,000 mcg by mouth daily., Disp: , Rfl:  ? ?Facility-Administered Medications Ordered in Other Encounters:  ?  0.9 %  sodium chloride infusion, , Intravenous, Once, Pennington, Rebekah M,  PA-C ?  iron sucrose (VENOFER) 200 mg in sodium chloride 0.9 % 100 mL IVPB, 200 mg, Intravenous, Once, Pennington, Rebekah M, PA-C  ? ?Allergies  ?Allergen Reactions  ? Gabapentin Shortness Of Breath  ? Lyrica [Pregabalin] Shortness Of Breath  ? ? ?Past Medical History:  ?Diagnosis Date  ? Arthritis   ? Diabetes ([place])   ? GERD (gastroesophageal reflux disease)   ? Heart murmur   ? Pneumonia 06/27/2006  ?  ? ?Past Surgical History:  ?Procedure Laterality Date  ? ANTERIOR FUSION CERVICAL SPINE    ? COLONOSCOPY WITH PROPOFOL N/A 03/26/2020  ? Procedure: COLONOSCOPY WITH PROPOFOL;  Surgeon: Daneil Dolin, MD;  [facility];  Service: Endoscopy;  Laterality: N/A;  ? disc in neck Left 05/2017  ? ESOPHAGEAL DILATION N/A 03/25/2020  ? Procedure: ESOPHAGEAL DILATION;  Surgeon: Rogene Houston, MD;  [facility];  Service: Endoscopy;  Laterality: N/A;  ? ESOPHAGOGASTRODUODENOSCOPY (EGD) WITH PROPOFOL N/A 03/25/2020  ? Procedure: ESOPHAGOGASTRODUODENOSCOPY (EGD) WITH PROPOFOL;  Surgeon: Rogene Houston, MD;  [facility];  Service: Endoscopy;  Laterality: N/A;  ? GIVENS CAPSULE STUDY N/A 04/13/2020  ? Procedure: GIVENS CAPSULE STUDY;  Surgeon: Daneil Dolin, MD;  [facility];  Service: Endoscopy;  Laterality: N/A;  7:30am  ? INSERTION OF MESH N/A 01/23/2013  ? Procedure: INSERTION OF MESH;  Surgeon: Adin Hector, MD;  [facility];  Service: General;  Laterality: N/A;  ? POLYPECTOMY  03/26/2020  ? Procedure: POLYPECTOMY;  Surgeon: Daneil Dolin, MD;  [facility];  Service: Endoscopy;;  ? TONSILLECTOMY    ? as child  ? VENTRAL HERNIA REPAIR N/A 01/23/2013  ? Procedure: LAPAROSCOPIC MULTIPLE VENTRAL HERNIAS;  Surgeon: Adin Hector, MD;  [facility];  Service: General;  Laterality: N/A;  ? ? ?Family History  ?Problem Relation Age of Onset  ? Depression Mother   ? Hearing loss Mother   ? Hyperlipidemia Mother   ? Hypertension Mother   ? Stroke Mother    ? Early death Father   ? Diabetes Brother   ? Pancreatic cancer Paternal Uncle   ? Colon cancer Neg Hx   ? ? ?Social History  ? ?Tobacco Use  ? Smoking status: Every Day  ?  Packs/day: 0.50  ?  Years: 42.00  ?  Pack years: 21.00  ?  Types: Cigarettes  ?  Start date: 49  ? Smokeless tobacco: Never  ?Substance Use Topics  ? Alcohol use: Yes  ?  Comment: moderately  ? Drug use: Not Currently  ?  Types: Cocaine  ?  Comment: states he has tried it all. Last used cocaine a few months ago.   ? ? ?ROS ? ? ?Objective:  ? ?Vitals: ?BP 122/85 (BP Location: Right Arm)   Pulse 88   Temp 98.8 ?F (37.1 ?C) (Oral)   Resp 20   SpO2 96%  ? ?Physical Exam ?Constitutional:   ?   General: He is not in acute distress. ?   Appearance: Normal appearance. He is well-developed and normal weight. He is not ill-appearing, toxic-appearing or diaphoretic.  ?HENT:  ?   Head: Normocephalic and atraumatic.  ?   Right Ear: Tympanic membrane, ear canal and external ear normal. There is no impacted cerumen.  ?   Left Ear: Tympanic membrane, ear canal and external ear normal. There is no impacted cerumen.  ?   Nose: Congestion present. No rhinorrhea.  ?   Mouth/Throat:  ?   Mouth: Mucous membranes are moist.  ?   Pharynx: No oropharyngeal exudate or posterior oropharyngeal erythema.  ?   Comments: Thick postnasal drainage overlying pharynx. ?Eyes:  ?   General: No scleral icterus.    ?   Right eye: No discharge.     ?   Left eye: No discharge.  ?   Extraocular Movements: Extraocular movements intact.  ?   Conjunctiva/sclera: Conjunctivae normal.  ?Cardiovascular:  ?   Rate and Rhythm: Normal rate and regular rhythm.  ?   Heart sounds: Normal heart sounds. No murmur heard. ?  No friction rub. No gallop.  ?Pulmonary:  ?   Effort: Pulmonary effort is normal. No respiratory distress.  ?   Breath sounds: Normal breath sounds. No stridor. No wheezing, rhonchi or rales.  ?Musculoskeletal:  ?   Cervical back: Normal range of motion and neck supple. No  rigidity. No muscular tenderness.  ?Neurological:  ?   General: No focal deficit present.  ?   Mental Status: He is alert and oriented to person, place, and time.  ?   Cranial Nerves: No cranial nerve deficit.  ?   Motor: No weakness.  ?Psychiatric:     ?   Mood and Affect: Mood normal.     ?   Behavior: Behavior normal.     ?   Thought Content: Thought content normal.  ? ? ?Assessment and Plan :  ? ?  PDMP not reviewed this encounter. ? ?1. Acute recurrent sinusitis, unspecified location   ?2. Allergic rhinitis, unspecified seasonality, unspecified trigger   ?3. Chronic obstructive pulmonary disease, unspecified COPD type ([place])   ?4. Type 2 diabetes mellitus treated without insulin ([place])   ?5. Smoker   ? ?Will start empiric treatment for sinusitis with amoxicillin.  In the context of his allergic rhinitis, COPD and ongoing smoking offered an oral prednisone course.  Recommended supportive care otherwise including the use of oral antihistamine, cough medications.  Given timeline of illness deferred respiratory testing.   Deferred imaging given clear cardiopulmonary exam, hemodynamically stable vital signs. Counseled patient on potential for adverse effects with medications prescribed/recommended today, ER and return-to-clinic precautions discussed, patient verbalized understanding. ? ?  ?Jaynee Eagles, PA-C ?07/21/21 1729 ? ?

## 2021-07-21 NOTE — ED Triage Notes (Signed)
Pt reports shortness of breath, fever, nasal congestion, chest congestion, "lungs aches" with deep breathing x 2 weeks. OTC meds gives no relief.  ?

## 2021-08-16 DIAGNOSIS — N5201 Erectile dysfunction due to arterial insufficiency: Secondary | ICD-10-CM | POA: Diagnosis not present

## 2021-08-24 ENCOUNTER — Other Ambulatory Visit: Payer: Self-pay

## 2021-08-24 ENCOUNTER — Inpatient Hospital Stay (HOSPITAL_COMMUNITY)
Admission: EM | Admit: 2021-08-24 | Discharge: 2021-08-28 | DRG: 287 | Disposition: A | Discharge status: Home / Self Care | Payer: PPO | Attending: Internal Medicine | Admitting: Internal Medicine

## 2021-08-24 ENCOUNTER — Emergency Department (HOSPITAL_COMMUNITY): Payer: PPO

## 2021-08-24 ENCOUNTER — Encounter: Payer: Self-pay | Admitting: Emergency Medicine

## 2021-08-24 ENCOUNTER — Ambulatory Visit
Admission: EM | Admit: 2021-08-24 | Discharge: 2021-08-24 | Disposition: A | Discharge status: Home / Self Care | Payer: PPO | Attending: Urgent Care | Admitting: Urgent Care

## 2021-08-24 ENCOUNTER — Encounter (HOSPITAL_COMMUNITY): Payer: Self-pay

## 2021-08-24 DIAGNOSIS — R0789 Other chest pain: Secondary | ICD-10-CM | POA: Diagnosis not present

## 2021-08-24 DIAGNOSIS — Z83438 Family history of other disorder of lipoprotein metabolism and other lipidemia: Secondary | ICD-10-CM | POA: Diagnosis not present

## 2021-08-24 DIAGNOSIS — Z8249 Family history of ischemic heart disease and other diseases of the circulatory system: Secondary | ICD-10-CM | POA: Diagnosis not present

## 2021-08-24 DIAGNOSIS — D6859 Other primary thrombophilia: Secondary | ICD-10-CM | POA: Diagnosis not present

## 2021-08-24 DIAGNOSIS — I10 Essential (primary) hypertension: Secondary | ICD-10-CM | POA: Diagnosis not present

## 2021-08-24 DIAGNOSIS — Z720 Tobacco use: Secondary | ICD-10-CM | POA: Diagnosis present

## 2021-08-24 DIAGNOSIS — R079 Chest pain, unspecified: Secondary | ICD-10-CM | POA: Diagnosis present

## 2021-08-24 DIAGNOSIS — R000 Tachycardia, unspecified: Secondary | ICD-10-CM | POA: Diagnosis not present

## 2021-08-24 DIAGNOSIS — K449 Diaphragmatic hernia without obstruction or gangrene: Secondary | ICD-10-CM | POA: Diagnosis present

## 2021-08-24 DIAGNOSIS — Z822 Family history of deafness and hearing loss: Secondary | ICD-10-CM

## 2021-08-24 DIAGNOSIS — Z818 Family history of other mental and behavioral disorders: Secondary | ICD-10-CM | POA: Diagnosis not present

## 2021-08-24 DIAGNOSIS — E669 Obesity, unspecified: Secondary | ICD-10-CM | POA: Diagnosis present

## 2021-08-24 DIAGNOSIS — D508 Other iron deficiency anemias: Secondary | ICD-10-CM | POA: Diagnosis not present

## 2021-08-24 DIAGNOSIS — R0602 Shortness of breath: Secondary | ICD-10-CM

## 2021-08-24 DIAGNOSIS — E119 Type 2 diabetes mellitus without complications: Secondary | ICD-10-CM

## 2021-08-24 DIAGNOSIS — K909 Intestinal malabsorption, unspecified: Secondary | ICD-10-CM | POA: Diagnosis not present

## 2021-08-24 DIAGNOSIS — F17210 Nicotine dependence, cigarettes, uncomplicated: Secondary | ICD-10-CM | POA: Diagnosis not present

## 2021-08-24 DIAGNOSIS — E1165 Type 2 diabetes mellitus with hyperglycemia: Secondary | ICD-10-CM | POA: Diagnosis present

## 2021-08-24 DIAGNOSIS — Z6833 Body mass index (BMI) 33.0-33.9, adult: Secondary | ICD-10-CM | POA: Diagnosis not present

## 2021-08-24 DIAGNOSIS — I4892 Unspecified atrial flutter: Secondary | ICD-10-CM | POA: Diagnosis present

## 2021-08-24 DIAGNOSIS — E1169 Type 2 diabetes mellitus with other specified complication: Secondary | ICD-10-CM

## 2021-08-24 DIAGNOSIS — Z888 Allergy status to other drugs, medicaments and biological substances status: Secondary | ICD-10-CM

## 2021-08-24 DIAGNOSIS — K219 Gastro-esophageal reflux disease without esophagitis: Secondary | ICD-10-CM | POA: Diagnosis not present

## 2021-08-24 DIAGNOSIS — Z833 Family history of diabetes mellitus: Secondary | ICD-10-CM | POA: Diagnosis not present

## 2021-08-24 DIAGNOSIS — I480 Paroxysmal atrial fibrillation: Principal | ICD-10-CM | POA: Diagnosis present

## 2021-08-24 DIAGNOSIS — I4891 Unspecified atrial fibrillation: Secondary | ICD-10-CM | POA: Diagnosis not present

## 2021-08-24 DIAGNOSIS — Z823 Family history of stroke: Secondary | ICD-10-CM

## 2021-08-24 DIAGNOSIS — D500 Iron deficiency anemia secondary to blood loss (chronic): Secondary | ICD-10-CM | POA: Diagnosis not present

## 2021-08-24 DIAGNOSIS — D509 Iron deficiency anemia, unspecified: Secondary | ICD-10-CM | POA: Diagnosis present

## 2021-08-24 DIAGNOSIS — I499 Cardiac arrhythmia, unspecified: Secondary | ICD-10-CM | POA: Diagnosis not present

## 2021-08-24 DIAGNOSIS — I517 Cardiomegaly: Secondary | ICD-10-CM | POA: Diagnosis not present

## 2021-08-24 DIAGNOSIS — F17200 Nicotine dependence, unspecified, uncomplicated: Secondary | ICD-10-CM | POA: Diagnosis not present

## 2021-08-24 DIAGNOSIS — Z79899 Other long term (current) drug therapy: Secondary | ICD-10-CM

## 2021-08-24 LAB — CBC
HCT: 40.3 % (ref 39.0–52.0)
Hemoglobin: 13.1 g/dL (ref 13.0–17.0)
MCH: 30 pg (ref 26.0–34.0)
MCHC: 32.5 g/dL (ref 30.0–36.0)
MCV: 92.2 fL (ref 80.0–100.0)
Platelets: 162 10*3/uL (ref 150–400)
RBC: 4.37 MIL/uL (ref 4.22–5.81)
RDW: 15.4 % (ref 11.5–15.5)
WBC: 7.2 10*3/uL (ref 4.0–10.5)
nRBC: 0 % (ref 0.0–0.2)

## 2021-08-24 LAB — BASIC METABOLIC PANEL
Anion gap: 11 (ref 5–15)
BUN: 15 mg/dL (ref 6–20)
CO2: 22 mmol/L (ref 22–32)
Calcium: 9.3 mg/dL (ref 8.9–10.3)
Chloride: 104 mmol/L (ref 98–111)
Creatinine, Ser: 0.66 mg/dL (ref 0.61–1.24)
GFR, Estimated: >60 mL/min (ref >60)
Glucose, Bld: 159 mg/dL — ABNORMAL HIGH (ref 70–99)
Potassium: 4.2 mmol/L (ref 3.5–5.1)
Sodium: 137 mmol/L (ref 135–145)

## 2021-08-24 LAB — TROPONIN I (HIGH SENSITIVITY)
Troponin I (High Sensitivity): 14 ng/L (ref <18)
Troponin I (High Sensitivity): 13 ng/L (ref <18)

## 2021-08-24 LAB — FERRITIN: Ferritin: 12 ng/mL — ABNORMAL LOW (ref 24–336)

## 2021-08-24 LAB — HEMOGLOBIN A1C
Hgb A1c MFr Bld: 8 % — ABNORMAL HIGH (ref 4.8–5.6)
Mean Plasma Glucose: 182.9 mg/dL

## 2021-08-24 LAB — IRON AND TIBC
Iron: 34 ug/dL — ABNORMAL LOW (ref 45–182)
Saturation Ratios: 8 % — ABNORMAL LOW (ref 17.9–39.5)
TIBC: 404 ug/dL (ref 250–450)
UIBC: 370 ug/dL

## 2021-08-24 LAB — TSH: TSH: 1.9 u[IU]/mL (ref 0.350–4.500)

## 2021-08-24 MED ORDER — DILTIAZEM HCL-DEXTROSE 125-5 MG/125ML-% IV SOLN (PREMIX)
5.0000 mg/h | INTRAVENOUS | Status: DC
  Administered 2021-08-24: 5 mg/h via INTRAVENOUS
  Administered 2021-08-24: 10 mg/h via INTRAVENOUS
  Filled 2021-08-24 (×2): qty 125

## 2021-08-24 MED ORDER — SODIUM CHLORIDE 0.9 % IV BOLUS
1000.0000 mL | Freq: Once | INTRAVENOUS | Status: AC
  Administered 2021-08-24: 1000 mL via INTRAVENOUS

## 2021-08-24 MED ORDER — ACETAMINOPHEN 325 MG PO TABS: 650.0000 mg | ORAL_TABLET | Freq: Four times a day (QID) | ORAL | Status: DC | PRN

## 2021-08-24 MED ORDER — FERROUS SULFATE 325 (65 FE) MG PO TABS
325.0000 mg | ORAL_TABLET | Freq: Two times a day (BID) | ORAL | Status: DC
  Administered 2021-08-24 – 2021-08-28 (×8): 325 mg via ORAL
  Filled 2021-08-24 (×8): qty 1

## 2021-08-24 MED ORDER — PANTOPRAZOLE SODIUM 40 MG PO TBEC
40.0000 mg | DELAYED_RELEASE_TABLET | Freq: Every day | ORAL | Status: DC
  Administered 2021-08-25 – 2021-08-28 (×4): 40 mg via ORAL
  Filled 2021-08-24 (×4): qty 1

## 2021-08-24 MED ORDER — ACETAMINOPHEN 650 MG RE SUPP: 650.0000 mg | Freq: Four times a day (QID) | RECTAL | Status: DC | PRN

## 2021-08-24 MED ORDER — ONDANSETRON HCL 4 MG/2ML IJ SOLN: 4.0000 mg | Freq: Four times a day (QID) | INTRAMUSCULAR | Status: DC | PRN

## 2021-08-24 MED ORDER — OXYCODONE-ACETAMINOPHEN 5-325 MG PO TABS
1.0000 | ORAL_TABLET | Freq: Three times a day (TID) | ORAL | Status: DC | PRN
  Administered 2021-08-25 – 2021-08-27 (×3): 1 via ORAL
  Filled 2021-08-24 (×3): qty 1

## 2021-08-24 MED ORDER — ONDANSETRON HCL 4 MG PO TABS: 4.0000 mg | ORAL_TABLET | Freq: Four times a day (QID) | ORAL | Status: DC | PRN

## 2021-08-24 MED ORDER — APIXABAN 5 MG PO TABS
5.0000 mg | ORAL_TABLET | Freq: Two times a day (BID) | ORAL | Status: DC
  Administered 2021-08-24 – 2021-08-26 (×4): 5 mg via ORAL
  Filled 2021-08-24 (×4): qty 1

## 2021-08-24 MED ORDER — DILTIAZEM LOAD VIA INFUSION
10.0000 mg | Freq: Once | INTRAVENOUS | Status: AC
Start: 2021-08-24 — End: 2021-08-24
  Administered 2021-08-24: 10 mg via INTRAVENOUS
  Filled 2021-08-24: qty 10

## 2021-08-24 NOTE — Assessment & Plan Note (Addendum)
Paroxysmal Atrial Fibrillation ?Suspect the patient has had intermittent RVR for the past 2 months by history ?CHADS-VASc = 3 (HTN, DM2, ASVD) ?Continue apixaban (temporarily on hold for CATH) ?Started on metoprolol XL 50 mg daily for rate control  ?TSH WNL ?Echocardiogram: LVEF reduced 40-45% with global hypokinesis and LVH  ?

## 2021-08-24 NOTE — ED Notes (Signed)
AC called to get a hospital bed for pt ?

## 2021-08-24 NOTE — Assessment & Plan Note (Addendum)
Currently not on any home BP medications ?He is now on metoprolol per cardiology team  ?

## 2021-08-24 NOTE — Assessment & Plan Note (Signed)
Tobacco cessation discussed 

## 2021-08-24 NOTE — ED Notes (Signed)
Hospital bed placed in patient room ?

## 2021-08-24 NOTE — H&P (Signed)
?History and Physical  ? ? ?Patient: Stephen Roberts:[id number] DOB: [birth year] ?DOA: 08/24/2021 ?DOS: the patient was seen and examined on 08/24/2021 ?PCP: Lindell Spar, MD  ?Patient coming from: Home ? ?Chief Complaint:  ?Chief Complaint  ?Patient presents with  ? Chest Pain  ? ?HPI: Stephen Roberts is a 56 year old male with a history of hypertension, iron deficiency anemia, Warthin's tumor (benign), and tobacco abuse (40 pack years), presenting with 69-monthhistory of intermittent palpitations, shortness of breath, and chest discomfort.  The patient states that he he has felt fatigued with dyspnea on exertion and no energy for the better part of the last 2 to 3 months.  He went to urgent care on 07/21/2021 with palpitations, " bronchial tube burning sensation" and shortness of breath.  The patient was placed on Augmentin and prednisone.  He states that he was better for about a week.  Unfortunately, his symptoms of dyspnea on exertion, palpitations, and chest discomfort returned.  He continues to smoke 1 pack/day.  He has had some subjective chills.  He denies any headache, neck pain, hemoptysis, nausea, vomiting, diarrhea, Donnell pain, dysuria, hematuria.  He does not take any prescription medications.  He drinks about 4-5 beers per week.  He does not use any illicit drugs.  The patient went to urgent care on the morning of 08/24/2021 with the above symptoms.  EKG showed atrial fibrillation with RVR.  The patient was sent to emergency department for further evaluation. ?In the ED, the patient was afebrile hemodynamically stable with oxygen saturation 98% room air.  Initial heart rate was 150s.  The patient was started on diltiazem drip.  BMP showed sodium 137, potassium 4.2, bicarbonate 22, serum creatinine 0.66.  WBC 7.2, hemoglobin 13.1, platelets 162,000.  EKG showed atrial fibrillation with nonspecific T wave changes.  Chest x-ray was negative for infiltrates but showed increased interstitial  markings. ? ?Review of Systems: As mentioned in the history of present illness. All other systems reviewed and are negative. ?Past Medical History:  ?Diagnosis Date  ? Arthritis   ? [place]   ? GERD (gastroesophageal reflux disease)   ? Heart murmur   ? Pneumonia 06/27/2006  ? ?Past Surgical History:  ?Procedure Laterality Date  ? ANTERIOR FUSION CERVICAL SPINE    ? COLONOSCOPY WITH PROPOFOL N/A 03/26/2020  ? Procedure: COLONOSCOPY WITH PROPOFOL;  Surgeon: RDaneil Dolin MD;  [facility];  Service: Endoscopy;  Laterality: N/A;  ? disc in neck Left 05/2017  ? ESOPHAGEAL DILATION N/A 03/25/2020  ? Procedure: ESOPHAGEAL DILATION;  Surgeon: RRogene Houston MD;  [facility];  Service: Endoscopy;  Laterality: N/A;  ? ESOPHAGOGASTRODUODENOSCOPY (EGD) WITH PROPOFOL N/A 03/25/2020  ? Procedure: ESOPHAGOGASTRODUODENOSCOPY (EGD) WITH PROPOFOL;  Surgeon: RRogene Houston MD;  [facility];  Service: Endoscopy;  Laterality: N/A;  ? GIVENS CAPSULE STUDY N/A 04/13/2020  ? Procedure: GIVENS CAPSULE STUDY;  Surgeon: RDaneil Dolin MD;  [facility];  Service: Endoscopy;  Laterality: N/A;  7:30am  ? INSERTION OF MESH N/A 01/23/2013  ? Procedure: INSERTION OF MESH;  Surgeon: HAdin Hector MD;  [facility];  Service: General;  Laterality: N/A;  ? POLYPECTOMY  03/26/2020  ? Procedure: POLYPECTOMY;  Surgeon: RDaneil Dolin MD;  [facility];  Service: Endoscopy;;  ? TONSILLECTOMY    ? as child  ? VENTRAL HERNIA REPAIR N/A 01/23/2013  ? Procedure: LAPAROSCOPIC MULTIPLE VENTRAL HERNIAS;  Surgeon: HRenelda Loma  Alyssa Grove, MD;  Location: [facility];  Service: General;  Laterality: N/A;  ? ?Social History:  reports that he has been smoking cigarettes. He started smoking about 43 years ago. He has a 21.00 pack-year smoking history. He has never used smokeless tobacco. He reports current alcohol use. He reports that he does not currently use drugs after having used the  following drugs: Cocaine. ? ?Allergies  ?Allergen Reactions  ? Gabapentin Shortness Of Breath  ? Lyrica [Pregabalin] Shortness Of Breath  ? ? ?Family History  ?Problem Relation Age of Onset  ? Depression Mother   ? Hearing loss Mother   ? Hyperlipidemia Mother   ? Hypertension Mother   ? Stroke Mother   ? Early death Father   ? Diabetes Brother   ? Pancreatic cancer Paternal Uncle   ? Colon cancer Neg Hx   ? ? ?Prior to Admission medications   ?Medication Sig Start Date End Date Taking? Authorizing Provider  ?albuterol (VENTOLIN HFA) 108 (90 Base) MCG/ACT inhaler INHALE 2 PUFFS INTO THE LUNGS EVERY 6 HOURS AS NEEDED FOR WHEEZING OR SHORTNESS OF BREATH 09/08/20  Yes Susy Frizzle, MD  ?Ascorbic Acid (VITAMIN C PO) Take 500 mg by mouth daily.   Yes [provider]  ?ferrous sulfate 325 (65 FE) MG EC tablet Take 1 tablet (325 mg total) by mouth 2 (two) times daily. 07/17/20 08/24/21 Yes [place], Modena Nunnery, MD  ?Multiple Vitamin (MULTIVITAMIN) tablet Take 1 tablet by mouth daily.   Yes [provider]  ?Omega-3 Fatty Acids (OMEGA-3 PO) Take 500 mg by mouth daily.   Yes [provider]  ?oxyCODONE-acetaminophen (PERCOCET/ROXICET) 5-325 MG tablet Take by mouth every 8 (eight) hours as needed for severe pain.   Yes [provider]  ?pantoprazole (PROTONIX) 40 MG tablet Take 1 tablet (40 mg total) by mouth daily. 07/17/20 08/24/21 Yes [place], Modena Nunnery, MD  ?Turmeric 500 MG CAPS Take 500 mg by mouth daily.   Yes [provider]  ?vitamin B-12 (CYANOCOBALAMIN) 1000 MCG tablet Take 1,000 mcg by mouth daily.   Yes [provider]  ?amoxicillin-clavulanate (AUGMENTIN) 875-125 MG tablet Take 1 tablet by mouth 2 (two) times daily. ?Patient not taking: Reported on 08/24/2021 07/21/21   Jaynee Eagles, PA-C  ?benzonatate (TESSALON) 100 MG capsule Take 1-2 capsules (100-200 mg total) by mouth 3 (three) times daily as needed for cough. ?Patient not taking: Reported on 08/24/2021 07/21/21    Jaynee Eagles, PA-C  ?predniSONE (DELTASONE) 50 MG tablet Take 1 tablet (50 mg total) by mouth daily with breakfast. ?Patient not taking: Reported on 08/24/2021 07/21/21   Jaynee Eagles, PA-C  ?promethazine-dextromethorphan (PROMETHAZINE-DM) 6.25-15 MG/5ML syrup Take 5 mLs by mouth at bedtime as needed for cough. ?Patient not taking: Reported on 08/24/2021 07/21/21   Jaynee Eagles, PA-C  ? ? ?Physical Exam: ?Vitals:  ? 08/24/21 1030 08/24/21 1037 08/24/21 1100 08/24/21 1200  ?BP: (!) 146/89  (!) 147/118 136/83  ?Pulse: (!) 133 (!) 111 (!) 122 83  ?Resp: 20 18 (!) 22 15  ?Temp:      ?TempSrc:      ?SpO2: 99% 97% 93% 98%  ?Weight:      ?Height:      ? ?GENERAL:  A&O x 3, NAD, well developed, cooperative, follows commands ?HEENT: [place]/AT, No thrush, No icterus, No oral ulcers ?Neck:  No neck mass, No meningismus, soft, supple ?CV: IRRR, no S3, no S4, no rub, no JVD ?Lungs:  diminished BS at bases.  No  wheeze ?Abd: soft/NT +BS, nondistended ?Ext: No edema, no lymphangitis, no cyanosis, no rashes ?Neuro:  CN II-XII intact, strength 4/5 in RUE, RLE, strength 4/5 LUE, LLE; sensation intact bilateral; no dysmetria; babinski equivocal ? ?Data Reviewed: ?Labs reviewed in the history ? ?Assessment and Plan: ?* Atrial fibrillation with RVR ([place]) ?Type unspecified ?Suspect the patient has had intermittent RVR for the past 2 months ?CHADS-VASc = 3 (HTN, DM2, ASVD) ?Start apixaban ?Continue diltiazem drip ?TSH ?Echocardiogram ? ?Iron deficiency anemia ?Check iron studies ?Continue ferrous sulfate ?Patient last received iron infusion 2 months ago ? ?Essential hypertension ?Currently not on any medications ?Holding antihypertensive medications to allow blood pressure margin for diltiazem titration ? ?Controlled type 2 diabetes mellitus without complication, without long-term current use of insulin ([place]) ?07/17/2020 hemoglobin A1c 6.41 ?12/11/2017 hemoglobin A1c 7.2 ?He was previously on metformin, he is now diet controlled ?Recheck hemoglobin  A1c ?NovoLog sliding scale ? ?Tobacco abuse ?Tobacco cessation discussed ? ? ? ? ? Advance Care Planning: FULL ? ?Consults: cardiology ? ?Family Communication: spouse updated 4/11 ? ?Severity of Illness: ?The appropriate patie

## 2021-08-24 NOTE — Assessment & Plan Note (Addendum)
07/17/2020 hemoglobin A1c 6.41 ?12/11/2017 hemoglobin A1c 7.2 ?He was previously on metformin, he is now diet controlled ?Recheck hemoglobin A1c -- 8.0%  ?NovoLog sliding scale ?CBG (last 3)  ?Recent Labs  ?  08/26/21 ?0308 08/26/21 ?0716 08/26/21 ?1118  ?GLUCAP 178* 143* 137*  ? ? ? ?

## 2021-08-24 NOTE — ED Notes (Signed)
Pt given sandwich and ginger ale.

## 2021-08-24 NOTE — ED Notes (Signed)
Patient is being discharged from the Urgent Care and sent to the Emergency Department via private vehicle . Per PA, patient is in need of higher level of care due to Chest pain and SOB, AFIB with RVR. Patient is aware and verbalizes understanding of plan of care.  ?Vitals:  ? 08/24/21 0829 08/24/21 0836  ?BP:  (!) 126/103  ?Pulse: 74   ?Resp: 18   ?Temp: 98.8 ?F (37.1 ?C)   ?SpO2: 98%   ? ? ?

## 2021-08-24 NOTE — ED Triage Notes (Signed)
Burning pain in chest with SOB x 3.5 weeks.  Was seen here on 3/8 and was placed on steroids.  Symptoms got better and now have returned.   ?

## 2021-08-24 NOTE — ED Triage Notes (Signed)
Patient via EMS from urgent care due to chest pressure, shortness of breath, and atrial fibrillation. The chest pressure has been going on for a month.   ?

## 2021-08-24 NOTE — Hospital Course (Addendum)
56 year old male with a history of hypertension, iron deficiency anemia, Warthin's tumor (benign), and tobacco abuse (40 pack years), presenting with 67-monthhistory of intermittent palpitations, shortness of breath, and chest discomfort.  The patient states that he he has felt fatigued with dyspnea on exertion and no energy for the better part of the last 2 to 3 months.  He went to urgent care on 07/21/2021 with palpitations, " bronchial tube burning sensation" and shortness of breath.  The patient was placed on Augmentin and prednisone.  He states that he was better for about a week.  Unfortunately, his symptoms of dyspnea on exertion, palpitations, and chest discomfort returned.  He continues to smoke 1 pack/day.  He has had some subjective chills.  He denies any headache, neck pain, hemoptysis, nausea, vomiting, diarrhea, Donnell pain, dysuria, hematuria.  He does not take any prescription medications.  He drinks about 4-5 beers per week.  He does not use any illicit drugs.  The patient went to urgent care on the morning of 08/24/2021 with the above symptoms.  EKG showed atrial fibrillation with RVR.  The patient was sent to emergency department for further evaluation. ?In the ED, the patient was afebrile hemodynamically stable with oxygen saturation 98% room air.  Initial heart rate was 150s.  The patient was started on diltiazem drip.  BMP showed sodium 137, potassium 4.2, bicarbonate 22, serum creatinine 0.66.  WBC 7.2, hemoglobin 13.1, platelets 162,000.  EKG showed atrial fibrillation with nonspecific T wave changes.  Chest x-ray was negative for infiltrates but showed increased interstitial markings. ? ?08/26/2021 : pt still having CP/SOB symptoms despite HR being controlled.  [facility]  Hold apixaban today for procedure.   ?

## 2021-08-24 NOTE — ED Notes (Signed)
Report given to [place] with [facility]. ?

## 2021-08-24 NOTE — Assessment & Plan Note (Addendum)
iron studies with persistent low iron level (34) ?Continue ferrous sulfate ?Patient last received iron infusion 2 months ago ?

## 2021-08-24 NOTE — ED Notes (Signed)
Patient called stating that he needed the restroom. Patient given urinal at this time. ?

## 2021-08-24 NOTE — ED Provider Notes (Signed)
?[place] ? ? ?MRN: [id number] DOB: [birth year] ? ?Subjective:  ? ?Stephen Roberts is a 56 y.o. male with PMH of type 2 diabetes treated without insulin COPD, allergic rhinitis, tobacco abuse presenting for 3-week history of recurrent shortness of breath, wheezing, intermittent atypical midsternal chest pain that radiates into the stomach area.  Denies having any type of chest pain today.  Patient was last seen 07/21/2021, started on Augmentin for acute recurrent sinusitis and prednisone for his COPD and allergic rhinitis.  Reports a history of having an abnormal heart condition when he was a baby but nothing ever came of it.  He has not seen a cardiologist in his adulthood.  Patient is still smoker, does 1 pack/day. ? ?No current facility-administered medications for this encounter. ? ?Current Outpatient Medications:  ?  albuterol (VENTOLIN HFA) 108 (90 Base) MCG/ACT inhaler, INHALE 2 PUFFS INTO THE LUNGS EVERY 6 HOURS AS NEEDED FOR WHEEZING OR SHORTNESS OF BREATH, Disp: 18 g, Rfl: 0 ?  amoxicillin-clavulanate (AUGMENTIN) 875-125 MG tablet, Take 1 tablet by mouth 2 (two) times daily., Disp: 20 tablet, Rfl: 0 ?  Ascorbic Acid (VITAMIN C PO), Take by mouth., Disp: , Rfl:  ?  benzonatate (TESSALON) 100 MG capsule, Take 1-2 capsules (100-200 mg total) by mouth 3 (three) times daily as needed for cough., Disp: 60 capsule, Rfl: 0 ?  ferrous sulfate 325 (65 FE) MG EC tablet, Take 1 tablet (325 mg total) by mouth 2 (two) times daily., Disp: 60 tablet, Rfl: 3 ?  Multiple Vitamin (MULTIVITAMIN) tablet, Take 1 tablet by mouth daily., Disp: , Rfl:  ?  Omega-3 Fatty Acids (OMEGA-3 PO), Take 500 mg by mouth daily., Disp: , Rfl:  ?  oxyCODONE-acetaminophen (PERCOCET/ROXICET) 5-325 MG tablet, Take by mouth every 8 (eight) hours as needed for severe pain., Disp: , Rfl:  ?  pantoprazole (PROTONIX) 40 MG tablet, Take 1 tablet (40 mg total) by mouth daily., Disp: 902 tablet, Rfl: 1 ?  predniSONE (DELTASONE) 50 MG  tablet, Take 1 tablet (50 mg total) by mouth daily with breakfast., Disp: 5 tablet, Rfl: 0 ?  promethazine-dextromethorphan (PROMETHAZINE-DM) 6.25-15 MG/5ML syrup, Take 5 mLs by mouth at bedtime as needed for cough., Disp: 100 mL, Rfl: 0 ?  Turmeric 500 MG CAPS, Take 500 mg by mouth daily., Disp: , Rfl:  ?  vitamin B-12 (CYANOCOBALAMIN) 1000 MCG tablet, Take 1,000 mcg by mouth daily., Disp: , Rfl:  ? ?Facility-Administered Medications Ordered in Other Encounters:  ?  0.9 %  sodium chloride infusion, , Intravenous, Once, [place], Rebekah M, PA-C ?  iron sucrose (VENOFER) 200 mg in sodium chloride 0.9 % 100 mL IVPB, 200 mg, Intravenous, Once, [place], Rebekah M, PA-C  ? ?Allergies  ?Allergen Reactions  ? Gabapentin Shortness Of Breath  ? Lyrica [Pregabalin] Shortness Of Breath  ? ? ?Past Medical History:  ?Diagnosis Date  ? Arthritis   ? Diabetes ([place])   ? GERD (gastroesophageal reflux disease)   ? Heart murmur   ? Pneumonia 06/27/2006  ?  ? ?Past Surgical History:  ?Procedure Laterality Date  ? ANTERIOR FUSION CERVICAL SPINE    ? COLONOSCOPY WITH PROPOFOL N/A 03/26/2020  ? Procedure: COLONOSCOPY WITH PROPOFOL;  Roberts: Daneil Dolin, MD;  [facility];  Service: Endoscopy;  Laterality: N/A;  ? disc in neck Left 05/2017  ? ESOPHAGEAL DILATION N/A 03/25/2020  ? Procedure: ESOPHAGEAL DILATION;  Roberts: Rogene Houston, MD;  [facility];  Service: Endoscopy;  Laterality:  N/A;  ? ESOPHAGOGASTRODUODENOSCOPY (EGD) WITH PROPOFOL N/A 03/25/2020  ? Procedure: ESOPHAGOGASTRODUODENOSCOPY (EGD) WITH PROPOFOL;  Roberts: Rogene Houston, MD;  [facility];  Service: Endoscopy;  Laterality: N/A;  ? GIVENS CAPSULE STUDY N/A 04/13/2020  ? Procedure: GIVENS CAPSULE STUDY;  Roberts: Daneil Dolin, MD;  [facility];  Service: Endoscopy;  Laterality: N/A;  7:30am  ? INSERTION OF MESH N/A 01/23/2013  ? Procedure: INSERTION OF MESH;  Roberts: Adin Hector, MD;  [facility];   Service: General;  Laterality: N/A;  ? POLYPECTOMY  03/26/2020  ? Procedure: POLYPECTOMY;  Roberts: Daneil Dolin, MD;  [facility];  Service: Endoscopy;;  ? TONSILLECTOMY    ? as child  ? VENTRAL HERNIA REPAIR N/A 01/23/2013  ? Procedure: LAPAROSCOPIC MULTIPLE VENTRAL HERNIAS;  Roberts: Adin Hector, MD;  [facility];  Service: General;  Laterality: N/A;  ? ? ?Family History  ?Problem Relation Age of Onset  ? Depression Mother   ? Hearing loss Mother   ? Hyperlipidemia Mother   ? Hypertension Mother   ? Stroke Mother   ? Early death Father   ? Diabetes Brother   ? Pancreatic cancer Paternal Uncle   ? Colon cancer Neg Hx   ? ? ?Social History  ? ?Tobacco Use  ? Smoking status: Every Day  ?  Packs/day: 0.50  ?  Years: 42.00  ?  Pack years: 21.00  ?  Types: Cigarettes  ?  Start date: 43  ? Smokeless tobacco: Never  ?Substance Use Topics  ? Alcohol use: Yes  ?  Comment: moderately  ? Drug use: Not Currently  ?  Types: Cocaine  ?  Comment: states he has tried it all. Last used cocaine a few months ago.   ? ? ?ROS ? ? ?Objective:  ? ?Vitals: ?Pulse 74   Temp 98.8 ?F (37.1 ?C) (Oral)   Resp 18   SpO2 98%  ? ?Physical Exam ?Constitutional:   ?   General: He is not in acute distress. ?   Appearance: Normal appearance. He is well-developed. He is not ill-appearing, toxic-appearing or diaphoretic.  ?HENT:  ?   Head: Normocephalic and atraumatic.  ?   Right Ear: External ear normal.  ?   Left Ear: External ear normal.  ?   Nose: Nose normal.  ?   Mouth/Throat:  ?   Mouth: Mucous membranes are moist.  ?   Pharynx: No oropharyngeal exudate or posterior oropharyngeal erythema.  ?Eyes:  ?   General: No scleral icterus.    ?   Right eye: No discharge.     ?   Left eye: No discharge.  ?   Extraocular Movements: Extraocular movements intact.  ?Cardiovascular:  ?   Rate and Rhythm: Normal rate and regular rhythm.  ?   Heart sounds: Normal heart sounds. No murmur heard. ?  No friction rub. No gallop.   ?Pulmonary:  ?   Effort: Pulmonary effort is normal. No respiratory distress.  ?   Breath sounds: No stridor. Rhonchi present. No wheezing or rales.  ?Skin: ?   General: Skin is warm and dry.  ?Neurological:  ?   Mental Status: He is alert and oriented to person, place, and time.  ?Psychiatric:     ?   Mood and Affect: Mood normal.     ?   Behavior: Behavior normal.     ?   Thought Content: Thought content normal.     ?  Judgment: Judgment normal.  ? ?ED ECG REPORT ? ? Date: 08/24/2021 ? EKG Time: 8:49 AM ? Rate: 119bpm ? Rhythm:  Atrial fibrillation with RVR   atrial fibrillation, rate 119bpm ? Axis: normal ? Intervals:none ? ST&T Change: T wave inversion in lead I, T wave flattening in aVF, aVL, V6 ? Narrative Interpretation: Atrial fibrillation with RVR at 119 bpm with frequent PVCs.  T wave changes as above.  This is new onset atrial fibrillation, previously has had PVCs. ? ? ?Assessment and Plan :  ? ?PDMP not reviewed this encounter. ? ?1. Atrial fibrillation with RVR ([place])   ?2. Shortness of breath   ?3. Atypical chest pain   ?4. Smoker   ?5. Tobacco abuse   ? ?Patient sent to the hospital by EMS due to new onset atrial fibrillation with RVR.  As patient did not have chest pain I deferred use of aspirin.  Pulse oximetry was at 98% and therefore deferred oxygen.  No other interventions were performed prior to discharge with EMS.  Case reported out, transfer of care completed. ?  ?Jaynee Eagles, PA-C ?08/24/21 8413 ? ?

## 2021-08-24 NOTE — ED Provider Notes (Signed)
?[place] ?Provider Note ? ? ?CSN: [id number] ?Arrival date & time: 08/24/21  9924 ? ?  ? ?History ? ?Chief Complaint  ?Patient presents with  ? Chest Pain  ? ? ?Stephen Roberts is a 56 y.o. male. ? ?HPI ? ?Patient with medical history including GERD, diabetes, heart murmur, carotid worsening tumor, iron deficient anemia due to occult GI bleed presents with complaints of chest tightness.  Patient states that over the last 2 to 3 days he has been feeling some heart palpitations, feels as if his heart is skipping a beat, this mainly happens after exertion, and feels slightly short of breath there is resolved after he sits down and relaxes.  He denies having any actual chest pain, denies becoming diaphoretic nausea or vomiting, states that he has felt some chest pressure but mainly feels it after he was smoking.  He states he has a history of hiatal hernia states this is probably from that.  He is currently having no symptoms at this time.   He currently denies any chest pain, palpitations, leg swelling, denies pleuritic chest pain or shortness of breath.  Denies any fevers chills nasal congestion sore throat or cough.  Patient does state though that he was sick about 3 weeks ago with an upper URI was started on Augmentin as well as steroids, states he is felt slightly better but still feels he has a lingering cough from that.  Patient has no significant cardiac history, no history of PEs or DVTs currently not on hormone therapy, patient has a history of diabetes which he is not being treated for, no hypertension or hyperlipidemia no family history of cardiac abnormalities. ? ?I reviewed patient's chart was seen at urgent care was noted to be in A-fib heart rate up into the 130s with a normal BP.  He is also being followed by oncology for parotid tumor and iron deficient anemia secondary due to occult GI bleed receives Feraheme 2 times weekly.  Patient previously had a colonoscopy performed 2021  which was essentially unremarkable as well as a upper endoscopy which reveals a 7 cm hiatal hernia. ?Home Medications ?Prior to Admission medications   ?Medication Sig Start Date End Date Taking? Authorizing Provider  ?albuterol (VENTOLIN HFA) 108 (90 Base) MCG/ACT inhaler INHALE 2 PUFFS INTO THE LUNGS EVERY 6 HOURS AS NEEDED FOR WHEEZING OR SHORTNESS OF BREATH 09/08/20  Yes Susy Frizzle, MD  ?Ascorbic Acid (VITAMIN C PO) Take 500 mg by mouth daily.   Yes [provider]  ?ferrous sulfate 325 (65 FE) MG EC tablet Take 1 tablet (325 mg total) by mouth 2 (two) times daily. 07/17/20 08/24/21 Yes Baraga, Modena Nunnery, MD  ?Multiple Vitamin (MULTIVITAMIN) tablet Take 1 tablet by mouth daily.   Yes [provider]  ?Omega-3 Fatty Acids (OMEGA-3 PO) Take 500 mg by mouth daily.   Yes [provider]  ?oxyCODONE-acetaminophen (PERCOCET/ROXICET) 5-325 MG tablet Take by mouth every 8 (eight) hours as needed for severe pain.   Yes [provider]  ?pantoprazole (PROTONIX) 40 MG tablet Take 1 tablet (40 mg total) by mouth daily. 07/17/20 08/24/21 Yes [place], Modena Nunnery, MD  ?Turmeric 500 MG CAPS Take 500 mg by mouth daily.   Yes [provider]  ?vitamin B-12 (CYANOCOBALAMIN) 1000 MCG tablet Take 1,000 mcg by mouth daily.   Yes [provider]  ?amoxicillin-clavulanate (AUGMENTIN) 875-125 MG tablet Take 1 tablet by mouth 2 (two) times daily. ?Patient not taking: Reported on 08/24/2021  07/21/21   Jaynee Eagles, PA-C  ?benzonatate (TESSALON) 100 MG capsule Take 1-2 capsules (100-200 mg total) by mouth 3 (three) times daily as needed for cough. ?Patient not taking: Reported on 08/24/2021 07/21/21   Jaynee Eagles, PA-C  ?predniSONE (DELTASONE) 50 MG tablet Take 1 tablet (50 mg total) by mouth daily with breakfast. ?Patient not taking: Reported on 08/24/2021 07/21/21   Jaynee Eagles, PA-C  ?promethazine-dextromethorphan (PROMETHAZINE-DM) 6.25-15 MG/5ML syrup Take 5 mLs by mouth at bedtime as needed  for cough. ?Patient not taking: Reported on 08/24/2021 07/21/21   Jaynee Eagles, PA-C  ?   ? ?Allergies    ?Gabapentin and Lyrica [pregabalin]   ? ?Review of Systems   ?Review of Systems  ?Constitutional:  Negative for chills and fever.  ?Respiratory:  Positive for chest tightness. Negative for shortness of breath.   ?Cardiovascular:  Positive for palpitations. Negative for chest pain.  ?Gastrointestinal:  Negative for abdominal pain.  ?Neurological:  Negative for headaches.  ? ?Physical Exam ?Updated Vital Signs ?BP 136/83   Pulse 83   Temp 98.3 ?F (36.8 ?C) (Oral)   Resp 15   Ht '5\' 9"'$  (1.753 m)   Wt 105.7 kg   SpO2 98%   BMI 34.41 kg/m?  ?Physical Exam ?Vitals and nursing note reviewed.  ?Constitutional:   ?   General: He is not in acute distress. ?   Appearance: He is not ill-appearing.  ?HENT:  ?   Head: Normocephalic and atraumatic.  ?   Nose: No congestion.  ?Eyes:  ?   Conjunctiva/sclera: Conjunctivae normal.  ?Cardiovascular:  ?   Rate and Rhythm: Tachycardia present. Rhythm irregular.  ?   Pulses: Normal pulses.  ?   Heart sounds: No murmur heard. ?  No friction rub. No gallop.  ?Pulmonary:  ?   Effort: No respiratory distress.  ?   Breath sounds: No wheezing, rhonchi or rales.  ?   Comments: No evidence of respiratory distress nontachypneic nonhypoxic able speak in full sentences, lung sounds were slightly tight on my exam with intermittent wheezing heard in the lower lobes on the right versus the left no rales or rhonchi present. ?Abdominal:  ?   Palpations: Abdomen is soft.  ?   Tenderness: There is no abdominal tenderness. There is no right CVA tenderness or left CVA tenderness.  ?Musculoskeletal:  ?   Right lower leg: No edema.  ?   Left lower leg: No edema.  ?Skin: ?   General: Skin is warm and dry.  ?Neurological:  ?   Mental Status: He is alert.  ?Psychiatric:     ?   Mood and Affect: Mood normal.  ? ? ?ED Results / Procedures / Treatments   ?Labs ?(all labs ordered are listed, but only abnormal  results are displayed) ?Labs Reviewed  ?BASIC METABOLIC PANEL - Abnormal; Notable for the following components:  ?    Result Value  ? Glucose, Bld 159 (*)   ? All other components within normal limits  ?CBC  ?POC OCCULT BLOOD, ED  ?TROPONIN I (HIGH SENSITIVITY)  ?TROPONIN I (HIGH SENSITIVITY)  ? ? ?EKG ?EKG Interpretation ? ?Date/Time:  Tuesday August 24 2021 09:16:40 EDT ?Ventricular Rate:  132 ?PR Interval:    ?QRS Duration: 96 ?QT Interval:  315 ?QTC Calculation: 474 ?R Axis:   19 ?Text Interpretation: Atrial flutter w variable conduction Nonspecific T abnormalities, lateral leads Confirmed by Godfrey Pick [phone]) on 08/24/2021 9:42:01 AM ? ?Radiology ?DG Chest Port 1 View ? ?Result  Date: 08/24/2021 ?CLINICAL DATA:  Chest pressure and shortness of breath. Atrial fibrillation. EXAM: PORTABLE CHEST 1 VIEW COMPARISON:  One-view chest x-ray 03/23/2020 FINDINGS: Heart is mildly enlarged, exaggerated by low lung volumes. Mild interstitial prominence noted throughout both lungs suggesting mild edema. No significant effusions are present. No focal airspace consolidation. IMPRESSION: 1. Borderline cardiomegaly and mild interstitial edema. 2. No focal airspace consolidation. Electronically Signed   By: San Morelle M.D.   On: 08/24/2021 09:49   ? ?Procedures ?[facility] KitchenCritical Care ?Performed by: Marcello Fennel, PA-C ?Authorized by: Marcello Fennel, PA-C  ? ?Critical care provider statement:  ?  Critical care time (minutes):  30 ?  Critical care time was exclusive of:  Separately billable procedures and treating other patients ?  Critical care was necessary to treat or prevent imminent or life-threatening deterioration of the following conditions:  Circulatory failure ?  Critical care was time spent personally by me on the following activities:  Discussions with consultants, evaluation of patient's response to treatment, examination of patient, ordering and review of laboratory studies, ordering and review of  radiographic studies, ordering and performing treatments and interventions, pulse oximetry, re-evaluation of patient's condition and review of old charts ?  I assumed direction of critical care for this patient from anot

## 2021-08-25 ENCOUNTER — Observation Stay (HOSPITAL_COMMUNITY): Payer: PPO

## 2021-08-25 DIAGNOSIS — Z888 Allergy status to other drugs, medicaments and biological substances status: Secondary | ICD-10-CM | POA: Diagnosis not present

## 2021-08-25 DIAGNOSIS — K449 Diaphragmatic hernia without obstruction or gangrene: Secondary | ICD-10-CM | POA: Diagnosis not present

## 2021-08-25 DIAGNOSIS — Z818 Family history of other mental and behavioral disorders: Secondary | ICD-10-CM | POA: Diagnosis not present

## 2021-08-25 DIAGNOSIS — E669 Obesity, unspecified: Secondary | ICD-10-CM | POA: Diagnosis not present

## 2021-08-25 DIAGNOSIS — F17210 Nicotine dependence, cigarettes, uncomplicated: Secondary | ICD-10-CM | POA: Diagnosis not present

## 2021-08-25 DIAGNOSIS — K219 Gastro-esophageal reflux disease without esophagitis: Secondary | ICD-10-CM | POA: Diagnosis not present

## 2021-08-25 DIAGNOSIS — Z83438 Family history of other disorder of lipoprotein metabolism and other lipidemia: Secondary | ICD-10-CM | POA: Diagnosis not present

## 2021-08-25 DIAGNOSIS — I10 Essential (primary) hypertension: Secondary | ICD-10-CM | POA: Diagnosis present

## 2021-08-25 DIAGNOSIS — Z823 Family history of stroke: Secondary | ICD-10-CM | POA: Diagnosis not present

## 2021-08-25 DIAGNOSIS — K909 Intestinal malabsorption, unspecified: Secondary | ICD-10-CM | POA: Diagnosis present

## 2021-08-25 DIAGNOSIS — E119 Type 2 diabetes mellitus without complications: Secondary | ICD-10-CM | POA: Diagnosis not present

## 2021-08-25 DIAGNOSIS — Z833 Family history of diabetes mellitus: Secondary | ICD-10-CM | POA: Diagnosis not present

## 2021-08-25 DIAGNOSIS — Z822 Family history of deafness and hearing loss: Secondary | ICD-10-CM | POA: Diagnosis not present

## 2021-08-25 DIAGNOSIS — D508 Other iron deficiency anemias: Secondary | ICD-10-CM | POA: Diagnosis not present

## 2021-08-25 DIAGNOSIS — I4891 Unspecified atrial fibrillation: Secondary | ICD-10-CM

## 2021-08-25 DIAGNOSIS — I480 Paroxysmal atrial fibrillation: Secondary | ICD-10-CM | POA: Diagnosis not present

## 2021-08-25 DIAGNOSIS — D6859 Other primary thrombophilia: Secondary | ICD-10-CM | POA: Diagnosis present

## 2021-08-25 DIAGNOSIS — E1165 Type 2 diabetes mellitus with hyperglycemia: Secondary | ICD-10-CM | POA: Diagnosis not present

## 2021-08-25 DIAGNOSIS — I4892 Unspecified atrial flutter: Secondary | ICD-10-CM | POA: Diagnosis present

## 2021-08-25 DIAGNOSIS — R079 Chest pain, unspecified: Secondary | ICD-10-CM | POA: Diagnosis not present

## 2021-08-25 DIAGNOSIS — Z79899 Other long term (current) drug therapy: Secondary | ICD-10-CM | POA: Diagnosis not present

## 2021-08-25 DIAGNOSIS — Z8249 Family history of ischemic heart disease and other diseases of the circulatory system: Secondary | ICD-10-CM | POA: Diagnosis not present

## 2021-08-25 DIAGNOSIS — D500 Iron deficiency anemia secondary to blood loss (chronic): Secondary | ICD-10-CM | POA: Diagnosis not present

## 2021-08-25 DIAGNOSIS — Z6833 Body mass index (BMI) 33.0-33.9, adult: Secondary | ICD-10-CM | POA: Diagnosis not present

## 2021-08-25 LAB — ECHOCARDIOGRAM COMPLETE
AR max vel: 2.28 cm2
AV Area VTI: 2.15 cm2
AV Area mean vel: 1.94 cm2
AV Mean grad: 6 mmHg
AV Peak grad: 9.5 mmHg
Ao pk vel: 1.55 m/s
Area-P 1/2: 4.86 cm2
Height: 69 in
MV VTI: 2.5 cm2
S' Lateral: 4 cm
Weight: 3671.98 oz

## 2021-08-25 LAB — HIV ANTIBODY (ROUTINE TESTING W REFLEX): HIV Screen 4th Generation wRfx: NONREACTIVE

## 2021-08-25 LAB — GLUCOSE, CAPILLARY
Glucose-Capillary: 199 mg/dL — ABNORMAL HIGH (ref 70–99)
Glucose-Capillary: 153 mg/dL — ABNORMAL HIGH (ref 70–99)

## 2021-08-25 LAB — MRSA NEXT GEN BY PCR, NASAL: MRSA by PCR Next Gen: NOT DETECTED

## 2021-08-25 LAB — MAGNESIUM: Magnesium: 1.8 mg/dL (ref 1.7–2.4)

## 2021-08-25 MED ORDER — METOPROLOL SUCCINATE ER 50 MG PO TB24
100.0000 mg | ORAL_TABLET | Freq: Every day | ORAL | Status: DC
  Filled 2021-08-25: qty 2

## 2021-08-25 MED ORDER — INSULIN ASPART 100 UNIT/ML IJ SOLN: 0.0000 [IU] | Freq: Every day | INTRAMUSCULAR | Status: DC

## 2021-08-25 MED ORDER — INSULIN ASPART 100 UNIT/ML IJ SOLN
0.0000 [IU] | Freq: Three times a day (TID) | INTRAMUSCULAR | Status: DC
  Administered 2021-08-25: 3 [IU] via SUBCUTANEOUS
  Administered 2021-08-26 (×3): 2 [IU] via SUBCUTANEOUS
  Administered 2021-08-27 (×2): 3 [IU] via SUBCUTANEOUS
  Administered 2021-08-28: 2 [IU] via SUBCUTANEOUS

## 2021-08-25 MED ORDER — CHLORHEXIDINE GLUCONATE CLOTH 2 % EX PADS
6.0000 | MEDICATED_PAD | Freq: Every day | CUTANEOUS | Status: DC
  Administered 2021-08-25 – 2021-08-28 (×4): 6 via TOPICAL

## 2021-08-25 MED ORDER — DILTIAZEM HCL ER COATED BEADS 180 MG PO CP24
360.0000 mg | ORAL_CAPSULE | Freq: Every day | ORAL | Status: DC
  Administered 2021-08-25: 360 mg via ORAL
  Filled 2021-08-25: qty 2

## 2021-08-25 MED ORDER — INSULIN ASPART 100 UNIT/ML IJ SOLN
3.0000 [IU] | Freq: Three times a day (TID) | INTRAMUSCULAR | Status: DC
  Administered 2021-08-25 – 2021-08-28 (×6): 3 [IU] via SUBCUTANEOUS

## 2021-08-25 MED ORDER — INSULIN ASPART 100 UNIT/ML IJ SOLN: 0.0000 [IU] | Freq: Three times a day (TID) | INTRAMUSCULAR | Status: DC

## 2021-08-25 NOTE — Progress Notes (Addendum)
? ?Progress Note ? ?Patient Name: Stephen Roberts ?Date of Encounter: 08/26/2021 ? ?[facility] Cardiologist: None  ? ?Subjective  ? ?Feeling "80%" better. States he continues to have chest tightness with activity. Specifically, we walked around the ICU unit and he developed chest tightness and significant SOB. HR at that time 70-80s, O2 sats 96-97%. He is concerned that his symptoms are persistent despite better control of HR. Notably, symptoms have been ongoing for several months prior to development of Afib. ? ?Inpatient Medications  ?  ?Scheduled Meds: ? Chlorhexidine Gluconate Cloth  6 each Topical Q0600  ? ferrous sulfate  325 mg Oral BID  ? insulin aspart  0-15 Units Subcutaneous TID WC  ? insulin aspart  0-5 Units Subcutaneous QHS  ? insulin aspart  3 Units Subcutaneous TID WC  ? metoprolol succinate  50 mg Oral Daily  ? pantoprazole  40 mg Oral Daily  ? rosuvastatin  20 mg Oral Daily  ? ?Continuous Infusions: ? ? ?PRN Meds: ?acetaminophen **OR** acetaminophen, ondansetron **OR** ondansetron (ZOFRAN) IV, oxyCODONE-acetaminophen  ? ?Vital Signs  ?  ?Vitals:  ? 08/26/21 0500 08/26/21 0600 08/26/21 0715 08/26/21 0826  ?BP:      ?Pulse:  70 (!) 108 73  ?Resp:  '10 17 15  '$ ?Temp: 97.7 ?F (36.5 ?C)  98.2 ?F (36.8 ?C)   ?TempSrc: Oral  Oral   ?SpO2:  96% 95% 99%  ?Weight:      ?Height:      ? ? ?Intake/Output Summary (Last 24 hours) at 08/26/2021 0941 ?Last data filed at 08/25/2021 1824 ?Gross per 24 hour  ?Intake 269.86 ml  ?Output 900 ml  ?Net -630.14 ml  ? ? ?  08/25/2021  ?  5:14 AM 08/24/2021  ?  9:14 AM 06/07/2021  ?  2:02 PM  ?Last 3 Weights  ?Weight (lbs) 229 lb 8 oz 233 lb 233 lb 7.5 oz  ?Weight (kg) 104.1 kg 105.688 kg 105.9 kg  ?   ? ?Telemetry  ?  ?Rate controlled Afib 50-80s - Personally Reviewed ? ?ECG  ?  ?No new tracing today - Personally Reviewed ? ?Physical Exam  ? ?GEN: No acute distress.   ?Neck: No JVD ?Cardiac: Irregularly, irregular ?Respiratory: Diminished but clear ?GI: Soft, nontender,  non-distended  ?MS: No edema; No deformity. ?Neuro:  Nonfocal  ?Psych: Normal affect  ? ?Labs  ?  ?High Sensitivity Troponin:   ?Recent Labs  ?Lab 08/24/21 ?0945 08/24/21 ?1105  ?TROPONINIHS 14 13  ?   ?Chemistry ?Recent Labs  ?Lab 08/24/21 ?0945 08/25/21 ?0340  ?NA 137  --   ?K 4.2  --   ?CL 104  --   ?CO2 22  --   ?GLUCOSE 159*  --   ?BUN 15  --   ?CREATININE 0.66  --   ?CALCIUM 9.3  --   ?MG  --  1.8  ?GFRNONAA >60  --   ?ANIONGAP 11  --   ?  ?Lipids No results for input(s): CHOL, TRIG, HDL, LABVLDL, LDLCALC, CHOLHDL in the last 168 hours.  ?Hematology ?Recent Labs  ?Lab 08/24/21 ?0945  ?WBC 7.2  ?RBC 4.37  ?HGB 13.1  ?HCT 40.3  ?MCV 92.2  ?MCH 30.0  ?MCHC 32.5  ?RDW 15.4  ?PLT 162  ? ?Thyroid  ?Recent Labs  ?Lab 08/24/21 ?0945  ?TSH 1.900  ?  ?BNPNo results for input(s): BNP, PROBNP in the last 168 hours.  ?DDimer No results for input(s): DDIMER in the last 168 hours.  ? ?  Radiology  ?  ?DG Chest Port 1 View ? ?Result Date: 08/24/2021 ?CLINICAL DATA:  Chest pressure and shortness of breath. Atrial fibrillation. EXAM: PORTABLE CHEST 1 VIEW COMPARISON:  One-view chest x-ray 03/23/2020 FINDINGS: Heart is mildly enlarged, exaggerated by low lung volumes. Mild interstitial prominence noted throughout both lungs suggesting mild edema. No significant effusions are present. No focal airspace consolidation. IMPRESSION: 1. Borderline cardiomegaly and mild interstitial edema. 2. No focal airspace consolidation. Electronically Signed   By: San Morelle M.D.   On: 08/24/2021 09:49  ? ?ECHOCARDIOGRAM COMPLETE ? ?Result Date: 08/25/2021 ?   ECHOCARDIOGRAM REPORT   Patient Name:   Stephen Roberts Date of Exam: 08/25/2021 Medical Rec #:  [id number]        Height:       69.0 in Accession #:    [id number]       Weight:       229.5 lb Date of Birth:  04-01-1966        BSA:          2.190 m? Patient Age:    22 years         BP:           135/82 mmHg Patient Gender: M                HR:           94 bpm. Exam Location:  [facility]  Procedure: 2D Echo, Cardiac Doppler and Color Doppler Indications:    Atrial Fibrillation  History:        Patient has prior history of Echocardiogram examinations, most                 recent 03/25/2020. COPD, Arrythmias:Atrial Fibrillation; Risk                 Factors:Hypertension, Diabetes, Dyslipidemia and Current Smoker.  Sonographer:    Wenda Low Referring Phys: [phone] DAVID TAT  Sonographer Comments: Image acquisition challenging due to COPD. IMPRESSIONS  1. Left ventricular ejection fraction, by estimation, is 45 to 50%. The left ventricle has mildly decreased function. The left ventricle demonstrates global hypokinesis. There is mild left ventricular hypertrophy. Left ventricular diastolic parameters are indeterminate.  2. Right ventricular systolic function is normal. The right ventricular size is normal. There is normal pulmonary artery systolic pressure.  3. Left atrial size was severely dilated.  4. Right atrial size was moderately dilated.  5. The mitral valve is normal in structure. Trivial mitral valve regurgitation. No evidence of mitral stenosis.  6. The aortic valve is tricuspid. Aortic valve regurgitation is not visualized. Aortic valve sclerosis is present, with no evidence of aortic valve stenosis.  7. The inferior vena cava is dilated in size with >50% respiratory variability, suggesting right atrial pressure of 8 mmHg. Comparison(s): LVEF is slight worse, LA size has increased. FINDINGS  Left Ventricle: Left ventricular ejection fraction, by estimation, is 45 to 50%. The left ventricle has mildly decreased function. The left ventricle demonstrates global hypokinesis. The left ventricular internal cavity size was normal in size. There is  mild left ventricular hypertrophy. Left ventricular diastolic function could not be evaluated due to atrial fibrillation. Left ventricular diastolic parameters are indeterminate. Right Ventricle: The right ventricular size is normal. No increase in  right ventricular wall thickness. Right ventricular systolic function is normal. There is normal pulmonary artery systolic pressure. The tricuspid regurgitant velocity is 2.52 m/s, and  with an assumed right atrial pressure of 8  mmHg, the estimated right ventricular systolic pressure is 79.1 mmHg. Left Atrium: Left atrial size was severely dilated. Right Atrium: Right atrial size was moderately dilated. Pericardium: There is no evidence of pericardial effusion. Mitral Valve: The mitral valve is normal in structure. Trivial mitral valve regurgitation. No evidence of mitral valve stenosis. MV peak gradient, 4.7 mmHg. The mean mitral valve gradient is 1.5 mmHg. Tricuspid Valve: The tricuspid valve is normal in structure. Tricuspid valve regurgitation is trivial. No evidence of tricuspid stenosis. Aortic Valve: The aortic valve is tricuspid. Aortic valve regurgitation is not visualized. Aortic valve sclerosis is present, with no evidence of aortic valve stenosis. Aortic valve mean gradient measures 6.0 mmHg. Aortic valve peak gradient measures 9.5  mmHg. Aortic valve area, by VTI measures 2.15 cm?. Pulmonic Valve: The pulmonic valve was not well visualized. Pulmonic valve regurgitation is not visualized. No evidence of pulmonic stenosis. Aorta: The aortic root and ascending aorta are structurally normal, with no evidence of dilitation. Venous: The inferior vena cava is dilated in size with greater than 50% respiratory variability, suggesting right atrial pressure of 8 mmHg. IAS/Shunts: No atrial level shunt detected by color flow Doppler.  LEFT VENTRICLE PLAX 2D LVIDd:         5.50 cm   Diastology LVIDs:         4.00 cm   LV e' medial:    10.00 cm/s LV PW:         1.25 cm   LV E/e' medial:  11.4 LV IVS:        1.35 cm   LV e' lateral:   10.30 cm/s LVOT diam:     2.00 cm   LV E/e' lateral: 11.1 LV SV:         62 LV SV Index:   28 LVOT Area:     3.14 cm?  RIGHT VENTRICLE RV Basal diam:  4.20 cm RV Mid diam:    3.60 cm  LEFT ATRIUM              Index        RIGHT ATRIUM           Index LA diam:        5.40 cm  2.47 cm/m?   RA Area:     25.70 cm? LA Vol (A2C):   111.0 ml 50.68 ml/m?  RA Volume:   92.50 ml  42.23 ml/m? LA Vol (A4C

## 2021-08-25 NOTE — Consult Note (Signed)
?Cardiology Consult:  ? ?Patient ID: Stephen Roberts ?MRN: [id number]; DOB: Dec 04, 1965  ? ?Admission date: 08/24/2021 ? ?PCP:  Lindell Spar, MD ?  ?[facility] Providers ?Cardiologist:  None      ? ? ?Chief Complaint:  new PAF ? ?Patient Profile:  ? ?Stephen Roberts is a 56 y.o. male with HTN, DM, Tobacco abuse, and IDA who is being seen 08/25/2021 for the evaluation of new PAF RVR CHADVASC of 2. ? ?History of Present Illness:  ? ?Stephen Roberts notes that he is feeling better but not back to normal.   ? ?For the past two months patient has had intermittent spells of chest pain associated with palpitations.  Sudden onset, no activity associated.  He has had PCP evaluation and has failed Augmentin and steroids.  No AF at that time per report.  08/24/21 symptoms have worsened to the point that he went in for evaluation.  Found to have AF RVR.  Admitted and started on diltiazem drip. ? ?Patient notes a history of IDA has had received IV iron.  He has no bleeding issues and notes that recently this has improved. ? ?Patient feels much better than prior with rates controlled < 100.  He still have some chest "feeling" that worsened this morning when walking around.   No shortness of breath, DOE today.  No PND or orthopnea.  No weight gain, leg swelling , or abdominal swelling.  No syncope or near syncope . Still feels palpitations, this appears to be when his heart rate goes about 100. ? ?Intermittent fatigue and DOE for 2/3 months. ?DOE ?Palpitations associated with CP ?4-5 beers a week ?Mod LA dilation 2021, normal LVEF ?Afib RVR with Ashman Beats ? ? ?Past Medical History:  ?Diagnosis Date  ? Arthritis   ? Diabetes ([place])   ? GERD (gastroesophageal reflux disease)   ? Heart murmur   ? Pneumonia 06/27/2006  ? ? ?Past Surgical History:  ?Procedure Laterality Date  ? ANTERIOR FUSION CERVICAL SPINE    ? COLONOSCOPY WITH PROPOFOL N/A 03/26/2020  ? Procedure: COLONOSCOPY WITH PROPOFOL;  Surgeon: Daneil Dolin, MD;   [facility];  Service: Endoscopy;  Laterality: N/A;  ? disc in neck Left 05/2017  ? ESOPHAGEAL DILATION N/A 03/25/2020  ? Procedure: ESOPHAGEAL DILATION;  Surgeon: Rogene Houston, MD;  [facility];  Service: Endoscopy;  Laterality: N/A;  ? ESOPHAGOGASTRODUODENOSCOPY (EGD) WITH PROPOFOL N/A 03/25/2020  ? Procedure: ESOPHAGOGASTRODUODENOSCOPY (EGD) WITH PROPOFOL;  Surgeon: Rogene Houston, MD;  [facility];  Service: Endoscopy;  Laterality: N/A;  ? GIVENS CAPSULE STUDY N/A 04/13/2020  ? Procedure: GIVENS CAPSULE STUDY;  Surgeon: Daneil Dolin, MD;  [facility];  Service: Endoscopy;  Laterality: N/A;  7:30am  ? INSERTION OF MESH N/A 01/23/2013  ? Procedure: INSERTION OF MESH;  Surgeon: Adin Hector, MD;  Location: [facility];  Service: General;  Laterality: N/A;  ? POLYPECTOMY  03/26/2020  ? Procedure: POLYPECTOMY;  Surgeon: Daneil Dolin, MD;  [facility];  Service: Endoscopy;;  ? TONSILLECTOMY    ? as child  ? VENTRAL HERNIA REPAIR N/A 01/23/2013  ? Procedure: LAPAROSCOPIC MULTIPLE VENTRAL HERNIAS;  Surgeon: Adin Hector, MD;  Location: [facility];  Service: General;  Laterality: N/A;  ?  ? ?Medications Prior to Admission: ?Prior to Admission medications   ?Medication Sig Start Date End Date Taking? Authorizing Provider  ?albuterol (VENTOLIN HFA) 108 (90 Base) MCG/ACT inhaler INHALE  2 PUFFS INTO THE LUNGS EVERY 6 HOURS AS NEEDED FOR WHEEZING OR SHORTNESS OF BREATH 09/08/20  Yes Susy Frizzle, MD  ?Ascorbic Acid (VITAMIN C PO) Take 500 mg by mouth daily.   Yes [provider]  ?ferrous sulfate 325 (65 FE) MG EC tablet Take 1 tablet (325 mg total) by mouth 2 (two) times daily. 07/17/20 08/24/21 Yes Sabula, Modena Nunnery, MD  ?Multiple Vitamin (MULTIVITAMIN) tablet Take 1 tablet by mouth daily.   Yes [provider]  ?Omega-3 Fatty Acids (OMEGA-3 PO) Take 500 mg by mouth daily.   Yes [provider]  ?oxyCODONE-acetaminophen  (PERCOCET/ROXICET) 5-325 MG tablet Take by mouth every 8 (eight) hours as needed for severe pain.   Yes [provider]  ?pantoprazole (PROTONIX) 40 MG tablet Take 1 tablet (40 mg total) by mouth daily. 07/17/20 08/24/21 Yes [place], Modena Nunnery, MD  ?Turmeric 500 MG CAPS Take 500 mg by mouth daily.   Yes [provider]  ?vitamin B-12 (CYANOCOBALAMIN) 1000 MCG tablet Take 1,000 mcg by mouth daily.   Yes [provider]  ?amoxicillin-clavulanate (AUGMENTIN) 875-125 MG tablet Take 1 tablet by mouth 2 (two) times daily. ?Patient not taking: Reported on 08/24/2021 07/21/21   Jaynee Eagles, PA-C  ?benzonatate (TESSALON) 100 MG capsule Take 1-2 capsules (100-200 mg total) by mouth 3 (three) times daily as needed for cough. ?Patient not taking: Reported on 08/24/2021 07/21/21   Jaynee Eagles, PA-C  ?predniSONE (DELTASONE) 50 MG tablet Take 1 tablet (50 mg total) by mouth daily with breakfast. ?Patient not taking: Reported on 08/24/2021 07/21/21   Jaynee Eagles, PA-C  ?promethazine-dextromethorphan (PROMETHAZINE-DM) 6.25-15 MG/5ML syrup Take 5 mLs by mouth at bedtime as needed for cough. ?Patient not taking: Reported on 08/24/2021 07/21/21   Jaynee Eagles, PA-C  ?  ? ?Allergies:    ?Allergies  ?Allergen Reactions  ? Gabapentin Shortness Of Breath  ? Lyrica [Pregabalin] Shortness Of Breath  ? ? ?Social History:   ?Social History  ? ?Socioeconomic History  ? Marital status: Married  ?  Spouse name: Not on file  ? Number of children: Not on file  ? Years of education: Not on file  ? Highest education level: Not on file  ?Occupational History  ? Not on file  ?Tobacco Use  ? Smoking status: Every Day  ?  Packs/day: 0.50  ?  Years: 42.00  ?  Pack years: 21.00  ?  Types: Cigarettes  ?  Start date: 74  ? Smokeless tobacco: Never  ?Substance and Sexual Activity  ? Alcohol use: Yes  ?  Comment: moderately  ? Drug use: Not Currently  ?  Types: Cocaine  ?  Comment: states he has tried it all. Last used cocaine a few months ago.   ?  Sexual activity: Yes  ?Other Topics Concern  ? Not on file  ?Social History Narrative  ? Not on file  ? ?Social Determinants of Health  ? ?Financial Resource Strain: Not on file  ?Food Insecurity: Not on file  ?Transportation Needs: Not on file  ?Physical Activity: Not on file  ?Stress: Not on file  ?Social Connections: Not on file  ?Intimate Partner Violence: Not on file  ?  ?Family History:   ?The patient's family history includes Depression in his mother; Diabetes in his brother; Early death in his father; Hearing loss in his mother; Hyperlipidemia in his mother; Hypertension in his mother; Pancreatic cancer in his paternal uncle; Stroke in his mother. There is no history  of Colon cancer.   ? ?ROS:  ?Please see the history of present illness.  ?All other ROS reviewed and negative.    ? ?Physical Exam/Data:  ? ?Vitals:  ? 08/25/21 0514 08/25/21 0600 08/25/21 0700 08/25/21 0713  ?BP: 135/82 109/80 135/89   ?Pulse: 70 68 (!) 44 (!) 39  ?Resp: 18 20 (!) 23 (!) 27  ?Temp: 97.7 ?F (36.5 ?C)   97.9 ?F (36.6 ?C)  ?TempSrc: Oral   Oral  ?SpO2: 96% 96% 94% 96%  ?Weight: 104.1 kg     ?Height: '5\' 9"'$  (1.753 m)     ? ? ?Intake/Output Summary (Last 24 hours) at 08/25/2021 0839 ?Last data filed at 08/25/2021 0800 ?Gross per 24 hour  ?Intake 1204.47 ml  ?Output 1000 ml  ?Net 204.47 ml  ? ? ?  08/25/2021  ?  5:14 AM 08/24/2021  ?  9:14 AM 06/07/2021  ?  2:02 PM  ?Last 3 Weights  ?Weight (lbs) 229 lb 8 oz 233 lb 233 lb 7.5 oz  ?Weight (kg) 104.1 kg 105.688 kg 105.9 kg  ?   ?Body mass index is 33.89 kg/m?.  ?General:  Well nourished, well developed, in no acute distress ?HEENT: normal ?Neck: no JVD ?Vascular: No carotid bruits; Distal pulses 2+ bilaterally   ?Cardiac:  normal S1, S2; IRIR tachycardia; no murmur  ?Lungs:  clear to auscultation bilaterally, no wheezing, rhonchi or rales  ?Abd: soft, nontender, no hepatomegaly  ?Ext: no edema ?Musculoskeletal:  No deformities, BUE and BLE strength normal and equal ?Skin: warm and dry   ?Neuro:  CNs 2-12 intact, no focal abnormalities noted ?Psych:  Normal affect  ? ?Laboratory Data: ? ?High Sensitivity Troponin:   ?Recent Labs  ?Lab 08/24/21 ?0945 08/24/21 ?1105  ?TROPONINIHS 14 13  ?    ?Chemistry ?Re

## 2021-08-25 NOTE — Progress Notes (Signed)
?PROGRESS NOTE ? ? ?Stephen Roberts  PJA:[id number] DOB: 12-Oct-1965 DOA: 08/24/2021 ?PCP: Lindell Spar, MD  ? ?Chief Complaint  ?Patient presents with  ? Chest Pain  ? ?Level of care: Telemetry ? ?Brief Admission History:  ?56 year old male with a history of hypertension, iron deficiency anemia, Warthin's tumor (benign), and tobacco abuse (40 pack years), presenting with 65-monthhistory of intermittent palpitations, shortness of breath, and chest discomfort.  The patient states that he he has felt fatigued with dyspnea on exertion and no energy for the better part of the last 2 to 3 months.  He went to urgent care on 07/21/2021 with palpitations, " bronchial tube burning sensation" and shortness of breath.  The patient was placed on Augmentin and prednisone.  He states that he was better for about a week.  Unfortunately, his symptoms of dyspnea on exertion, palpitations, and chest discomfort returned.  He continues to smoke 1 pack/day.  He has had some subjective chills.  He denies any headache, neck pain, hemoptysis, nausea, vomiting, diarrhea, Donnell pain, dysuria, hematuria.  He does not take any prescription medications.  He drinks about 4-5 beers per week.  He does not use any illicit drugs.  The patient went to urgent care on the morning of 08/24/2021 with the above symptoms.  EKG showed atrial fibrillation with RVR.  The patient was sent to emergency department for further evaluation. ?In the ED, the patient was afebrile hemodynamically stable with oxygen saturation 98% room air.  Initial heart rate was 150s.  The patient was started on diltiazem drip.  BMP showed sodium 137, potassium 4.2, bicarbonate 22, serum creatinine 0.66.  WBC 7.2, hemoglobin 13.1, platelets 162,000.  EKG showed atrial fibrillation with nonspecific T wave changes.  Chest x-ray was negative for infiltrates but showed increased interstitial markings. ?  ?Assessment and Plan: ?* Atrial fibrillation with RVR ([place] ?Type  unspecified ?Suspect the patient has had intermittent RVR for the past 2 months ?CHADS-VASc = 3 (HTN, DM2, ASVD) ?Start apixaban ?Continue diltiazem drip ?TSH ?Echocardiogram ? ?Iron deficiency anemia ?iron studies with persistent low iron level (34) ?Continue ferrous sulfate ?Patient last received iron infusion 2 months ago ? ?Essential hypertension ?Currently not on any home BP medications ?He is now on cardizem CD per cardiology team  ? ?Controlled type 2 diabetes mellitus without complication, without long-term current use of insulin ([place] ?07/17/2020 hemoglobin A1c 6.41 ?12/11/2017 hemoglobin A1c 7.2 ?He was previously on metformin, he is now diet controlled ?Recheck hemoglobin A1c ?NovoLog sliding scale ? ?Tobacco abuse ?Tobacco cessation discussed ? ? ?DVT prophylaxis: apixaban  ?Code Status: Full  ?Family Communication:  ?Disposition: Status is: Inpatient ?Remains inpatient appropriate because: transitioning from IV cardizem to oral  ?  ?Consultants:  ?cardiology ?Procedures:  ? ?Antimicrobials:  ?  ?Subjective: ?Pt still having palpitations.   ?Objective: ?Vitals:  ? 08/25/21 1158 08/25/21 1200 08/25/21 1300 08/25/21 1618  ?BP: 124/79 130/86 117/85   ?Pulse: 64 80 (!) 129   ?Resp: (!) '22 17 20 '$ (!) 22  ?Temp:    98.7 ?F (37.1 ?C)  ?TempSrc:    Oral  ?SpO2: 95% 94% 97% 97%  ?Weight:      ?Height:      ? ? ?Intake/Output Summary (Last 24 hours) at 08/25/2021 1703 ?Last data filed at 08/25/2021 1400 ?Gross per 24 hour  ?Intake 474.33 ml  ?Output --  ?Net 474.33 ml  ? ?Filed Weights  ? 08/24/21 0914 08/25/21 0514  ?Weight: 105.7 kg 104.1 kg  ? ?  Examination: ? ?General exam: Appears calm and comfortable  ?Respiratory system: Clear to auscultation. Respiratory effort normal. ?Cardiovascular system: irreg/irreg, normal S1 & S2 heard. No JVD, murmurs, rubs, gallops or clicks. No pedal edema. ?Gastrointestinal system: Abdomen is nondistended, soft and nontender. No organomegaly or masses felt. Normal bowel sounds  heard. ?Central nervous system: Alert and oriented. No focal neurological deficits. ?Extremities: Symmetric 5 x 5 power. ?Skin: No rashes, lesions or ulcers. ?Psychiatry: Judgement and insight appear normal. Mood & affect appropriate.  ? ?Data Reviewed: I have personally reviewed following labs and imaging studies ? ?CBC: ?Recent Labs  ?Lab 08/24/21 ?0945  ?WBC 7.2  ?HGB 13.1  ?HCT 40.3  ?MCV 92.2  ?PLT 162  ? ? ?Basic Metabolic Panel: ?Recent Labs  ?Lab 08/24/21 ?0945 08/25/21 ?0340  ?NA 137  --   ?K 4.2  --   ?CL 104  --   ?CO2 22  --   ?GLUCOSE 159*  --   ?BUN 15  --   ?CREATININE 0.66  --   ?CALCIUM 9.3  --   ?MG  --  1.8  ? ? ?CBG: ?No results for input(s): GLUCAP in the last 168 hours. ? ?Recent Results (from the past 240 hour(s))  ?MRSA Next Gen by PCR, Nasal     Status: None  ? Collection Time: 08/25/21  5:15 AM  ? Specimen: Nasal Mucosa; Nasal Swab  ?Result Value Ref Range Status  ? MRSA by PCR Next Gen NOT DETECTED NOT DETECTED Final  ?  Comment: (NOTE) ?The GeneXpert MRSA Assay (FDA approved for NASAL specimens only), ?is one component of a comprehensive MRSA colonization surveillance ?program. It is not intended to diagnose MRSA infection nor to guide ?or monitor treatment for MRSA infections. ?Test performance is not FDA approved in patients less than 2 years ?old. ?Performed at [facility], [address], [place], [place] [address] ?  ?  ? ?Radiology Studies: ?DG Chest Port 1 View ? ?Result Date: 08/24/2021 ?CLINICAL DATA:  Chest pressure and shortness of breath. Atrial fibrillation. EXAM: PORTABLE CHEST 1 VIEW COMPARISON:  One-view chest x-ray 03/23/2020 FINDINGS: Heart is mildly enlarged, exaggerated by low lung volumes. Mild interstitial prominence noted throughout both lungs suggesting mild edema. No significant effusions are present. No focal airspace consolidation. IMPRESSION: 1. Borderline cardiomegaly and mild interstitial edema. 2. No focal airspace consolidation. Electronically Signed   By:  San Morelle M.D.   On: 08/24/2021 09:49  ? ?ECHOCARDIOGRAM COMPLETE ? ?Result Date: 08/25/2021 ?   ECHOCARDIOGRAM REPORT   Patient Name:   Stephen Roberts Date of Exam: 08/25/2021 Medical Rec #:  [id number]        Height:       69.0 in Accession #:    [id number]       Weight:       229.5 lb Date of Birth:  04-Sep-1965        BSA:          2.190 m? Patient Age:    58 years         BP:           135/82 mmHg Patient Gender: M                HR:           94 bpm. Exam Location:  [facility] Procedure: 2D Echo, Cardiac Doppler and Color Doppler Indications:    Atrial Fibrillation  History:        Patient has prior  history of Echocardiogram examinations, most                 recent 03/25/2020. COPD, Arrythmias:Atrial Fibrillation; Risk                 Factors:Hypertension, Diabetes, Dyslipidemia and Current Smoker.  Sonographer:    Wenda Low Referring Phys: [phone] DAVID TAT  Sonographer Comments: Image acquisition challenging due to COPD. IMPRESSIONS  1. Left ventricular ejection fraction, by estimation, is 45 to 50%. The left ventricle has mildly decreased function. The left ventricle demonstrates global hypokinesis. There is mild left ventricular hypertrophy. Left ventricular diastolic parameters are indeterminate.  2. Right ventricular systolic function is normal. The right ventricular size is normal. There is normal pulmonary artery systolic pressure.  3. Left atrial size was severely dilated.  4. Right atrial size was moderately dilated.  5. The mitral valve is normal in structure. Trivial mitral valve regurgitation. No evidence of mitral stenosis.  6. The aortic valve is tricuspid. Aortic valve regurgitation is not visualized. Aortic valve sclerosis is present, with no evidence of aortic valve stenosis.  7. The inferior vena cava is dilated in size with >50% respiratory variability, suggesting right atrial pressure of 8 mmHg. Comparison(s): LVEF is slight worse, LA size has increased. FINDINGS  Left  Ventricle: Left ventricular ejection fraction, by estimation, is 45 to 50%. The left ventricle has mildly decreased function. The left ventricle demonstrates global hypokinesis. The left ventricular internal cavity size was normal in s

## 2021-08-25 NOTE — Progress Notes (Signed)
*  PRELIMINARY RESULTS* ?Echocardiogram ?2D Echocardiogram has been performed. ? ?Stephen Roberts ?08/25/2021, 9:22 AM ?

## 2021-08-25 NOTE — TOC Progression Note (Signed)
?  Transition of Care (TOC) Screening Note ? ? ?Patient Details  ?Name: Stephen Roberts ?Date of Birth: [birth year] ? ? ?Transition of Care (TOC) CM/SW Contact:    ?Shade Flood, LCSW ?Phone Number: ?08/25/2021, 1:04 PM ? ? ? ?Transition of Care Department [facility]) has reviewed patient and no TOC needs have been identified at this time. We will continue to monitor patient advancement through interdisciplinary progression rounds. If new patient transition needs arise, please place a TOC consult. ? ? ?

## 2021-08-26 DIAGNOSIS — R079 Chest pain, unspecified: Secondary | ICD-10-CM | POA: Diagnosis present

## 2021-08-26 LAB — GLUCOSE, CAPILLARY
Glucose-Capillary: 178 mg/dL — ABNORMAL HIGH (ref 70–99)
Glucose-Capillary: 143 mg/dL — ABNORMAL HIGH (ref 70–99)
Glucose-Capillary: 137 mg/dL — ABNORMAL HIGH (ref 70–99)
Glucose-Capillary: 131 mg/dL — ABNORMAL HIGH (ref 70–99)
Glucose-Capillary: 132 mg/dL — ABNORMAL HIGH (ref 70–99)

## 2021-08-26 MED ORDER — SODIUM CHLORIDE 0.9 % IV SOLN: 250.0000 mL | INTRAVENOUS | Status: DC | PRN

## 2021-08-26 MED ORDER — SODIUM CHLORIDE 0.9 % IV SOLN: INTRAVENOUS | Status: DC

## 2021-08-26 MED ORDER — ASPIRIN 81 MG PO CHEW
81.0000 mg | CHEWABLE_TABLET | ORAL | Status: AC
  Administered 2021-08-27: 81 mg via ORAL
  Filled 2021-08-26: qty 1

## 2021-08-26 MED ORDER — METOPROLOL SUCCINATE ER 50 MG PO TB24
50.0000 mg | ORAL_TABLET | Freq: Every day | ORAL | Status: DC
  Administered 2021-08-26 – 2021-08-28 (×3): 50 mg via ORAL
  Filled 2021-08-26 (×2): qty 1

## 2021-08-26 MED ORDER — SODIUM CHLORIDE 0.9% FLUSH
3.0000 mL | Freq: Two times a day (BID) | INTRAVENOUS | Status: DC
  Administered 2021-08-26: 3 mL via INTRAVENOUS

## 2021-08-26 MED ORDER — ROSUVASTATIN CALCIUM 20 MG PO TABS
20.0000 mg | ORAL_TABLET | Freq: Every day | ORAL | Status: DC
  Administered 2021-08-26 – 2021-08-28 (×3): 20 mg via ORAL
  Filled 2021-08-26 (×3): qty 1

## 2021-08-26 MED ORDER — SODIUM CHLORIDE 0.9% FLUSH: 3.0000 mL | INTRAVENOUS | Status: DC | PRN

## 2021-08-26 NOTE — Progress Notes (Signed)
Received call from [facility] with [place], reported on patient given, ETA 61mns, patient & patient's family made aware  ?

## 2021-08-26 NOTE — Progress Notes (Signed)
?PROGRESS NOTE ? ? ?Stephen Roberts  KGM:[id number] DOB: July 16, 1965 DOA: 08/24/2021 ?PCP: Lindell Spar, MD  ? ?Chief Complaint  ?Patient presents with  ? Chest Pain  ? ?Level of care: Telemetry Cardiac ? ?Brief Admission History:  ?56 year old male with a history of hypertension, iron deficiency anemia, Warthin's tumor (benign), and tobacco abuse (40 pack years), presenting with 57-monthhistory of intermittent palpitations, shortness of breath, and chest discomfort.  The patient states that he he has felt fatigued with dyspnea on exertion and no energy for the better part of the last 2 to 3 months.  He went to urgent care on 07/21/2021 with palpitations, " bronchial tube burning sensation" and shortness of breath.  The patient was placed on Augmentin and prednisone.  He states that he was better for about a week.  Unfortunately, his symptoms of dyspnea on exertion, palpitations, and chest discomfort returned.  He continues to smoke 1 pack/day.  He has had some subjective chills.  He denies any headache, neck pain, hemoptysis, nausea, vomiting, diarrhea, Donnell pain, dysuria, hematuria.  He does not take any prescription medications.  He drinks about 4-5 beers per week.  He does not use any illicit drugs.  The patient went to urgent care on the morning of 08/24/2021 with the above symptoms.  EKG showed atrial fibrillation with RVR.  The patient was sent to emergency department for further evaluation. ?In the ED, the patient was afebrile hemodynamically stable with oxygen saturation 98% room air.  Initial heart rate was 150s.  The patient was started on diltiazem drip.  BMP showed sodium 137, potassium 4.2, bicarbonate 22, serum creatinine 0.66.  WBC 7.2, hemoglobin 13.1, platelets 162,000.  EKG showed atrial fibrillation with nonspecific T wave changes.  Chest x-ray was negative for infiltrates but showed increased interstitial markings. ? ?08/26/2021 : pt still having CP/SOB symptoms despite HR being controlled.   [facility]  Hold apixaban today for procedure.   ?  ?Assessment and Plan: ?* Atrial fibrillation with RVR  ?Paroxysmal Atrial Fibrillation ?Suspect the patient has had intermittent RVR for the past 2 months by history ?CHADS-VASc = 3 (HTN, DM2, ASVD) ?Continue apixaban (temporarily on hold for CATH) ?Started on metoprolol XL 50 mg daily for rate control  ?TSH WNL ?Echocardiogram: LVEF reduced 40-45% with global hypokinesis and LVH  ? ?Intermittent chest pain ?--[facility]  ? ?Iron deficiency anemia ?iron studies with persistent low iron level (34) ?Continue ferrous sulfate ?Patient last received iron infusion 2 months ago ? ?Essential hypertension ?Currently not on any home BP medications ?He is now on metoprolol per cardiology team  ? ?Controlled type 2 diabetes mellitus without complication, without long-term current use of insulin ([place] ?07/17/2020 hemoglobin A1c 6.41 ?12/11/2017 hemoglobin A1c 7.2 ?He was previously on metformin, he is now diet controlled ?Recheck hemoglobin A1c -- 8.0%  ?NovoLog sliding scale ?CBG (last 3)  ?Recent Labs  ?  08/26/21 ?0308 08/26/21 ?0716 08/26/21 ?1118  ?GLUCAP 178* 143* 137*  ? ? ? ? ?Tobacco abuse ?Tobacco cessation discussed ? ? ?DVT prophylaxis: apixaban  ?Code Status: Full  ?Family Communication:  ?Disposition: Status is: Inpatient ?Remains inpatient appropriate because: transitioning from IV cardizem to oral  ?  ?Consultants:  ?cardiology ?Procedures:  ? ?Antimicrobials:  ?  ?Subjective: ?Pt still having intermittent chest discomfort.  No palpitations   ?Objective: ?Vitals:  ? 08/26/21 0600 08/26/21 0715 08/26/21 0826 08/26/21 1117  ?BP:      ?  Pulse: 70 (!) 108 73   ?Resp: '10 17 15   '$ ?Temp:  98.2 ?F (36.8 ?C)  97.6 ?F (36.4 ?C)  ?TempSrc:  Oral  Oral  ?SpO2: 96% 95% 99%   ?Weight:      ?Height:      ? ? ?Intake/Output Summary (Last 24 hours) at 08/26/2021 1219 ?Last data filed at 08/25/2021  1824 ?Gross per 24 hour  ?Intake 250.09 ml  ?Output 600 ml  ?Net -349.91 ml  ? ?Filed Weights  ? 08/24/21 0914 08/25/21 0514  ?Weight: 105.7 kg 104.1 kg  ? ?Examination: ? ?General exam: Appears calm and comfortable  ?Respiratory system: Clear to auscultation. Respiratory effort normal. ?Cardiovascular system: irreg/irreg, normal S1 & S2 heard. No JVD, murmurs, rubs, gallops or clicks. No pedal edema. ?Gastrointestinal system: Abdomen is nondistended, soft and nontender. No organomegaly or masses felt. Normal bowel sounds heard. ?Central nervous system: Alert and oriented. No focal neurological deficits. ?Extremities: Symmetric 5 x 5 power. ?Skin: No rashes, lesions or ulcers. ?Psychiatry: Judgement and insight appear normal. Mood & affect appropriate.  ? ?Data Reviewed: I have personally reviewed following labs and imaging studies ? ?CBC: ?Recent Labs  ?Lab 08/24/21 ?0945  ?WBC 7.2  ?HGB 13.1  ?HCT 40.3  ?MCV 92.2  ?PLT 162  ? ? ?Basic Metabolic Panel: ?Recent Labs  ?Lab 08/24/21 ?0945 08/25/21 ?0340  ?NA 137  --   ?K 4.2  --   ?CL 104  --   ?CO2 22  --   ?GLUCOSE 159*  --   ?BUN 15  --   ?CREATININE 0.66  --   ?CALCIUM 9.3  --   ?MG  --  1.8  ? ? ?CBG: ?Recent Labs  ?Lab 08/25/21 ?1946 08/25/21 ?2214 08/26/21 ?0308 08/26/21 ?8563 08/26/21 ?1118  ?GLUCAP 199* 153* 178* 143* 137*  ? ? ?Recent Results (from the past 240 hour(s))  ?MRSA Next Gen by PCR, Nasal     Status: None  ? Collection Time: 08/25/21  5:15 AM  ? Specimen: Nasal Mucosa; Nasal Swab  ?Result Value Ref Range Status  ? MRSA by PCR Next Gen NOT DETECTED NOT DETECTED Final  ?  Comment: (NOTE) ?The GeneXpert MRSA Assay (FDA approved for NASAL specimens only), ?is one component of a comprehensive MRSA colonization surveillance ?program. It is not intended to diagnose MRSA infection nor to guide ?or monitor treatment for MRSA infections. ?Test performance is not FDA approved in patients less than 2 years ?old. ?Performed at [facility], [address], [place], [place] [address] ?  ?  ? ?Radiology Studies: ?ECHOCARDIOGRAM COMPLETE ? ?Result Date: 08/25/2021 ?   ECHOCARDIOGRAM REPORT   Patient Name:   Stephen Roberts Date of Exam: 08/25/2021 Medical Rec #:  [id number]        Height:       69.0 in Accession #:    [id number]       Weight:       229.5 lb Date of Birth:  02/17/1966        BSA:          2.190 m? Patient Age:    2 years         BP:           135/82 mmHg Patient Gender: M                HR:           94 bpm. Exam Location:  [facility] Procedure: 2D Echo,  Cardiac Doppler and Color Doppler Indications:    Atrial Fibrillation  History:        Patient has prior history of Echocardiogram examinations, most                 recent 03/25/2020. COPD, Arrythmias:Atrial Fibrillation; Risk                 Factors:Hypertension, Diabetes, Dyslipidemia and Current Smoker.  Sonographer:    Wenda Low Referring Phys: [phone] DAVID TAT  Sonographer Comments: Image acquisition challenging due to COPD. IMPRESSIONS  1. Left ventricular ejection fraction, by estimation, is 45 to 50%. The left ventricle has mildly decreased function. The left ventricle demonstrates global hypokinesis. There is mild left ventricular hypertrophy. Left ventricular diastolic parameters are indeterminate.  2. Right ventricular systolic function is normal. The right ventricular size is normal. There is normal pulmonary artery systolic pressure.  3. Left atrial size was severely dilated.  4. Right atrial size was moderately dilated.  5. The mitral valve is normal in structure. Trivial mitral valve regurgitation. No evidence of mitral stenosis.  6. The aortic valve is tricuspid. Aortic valve regurgitation is not visualized. Aortic valve sclerosis is present, with no evidence of aortic valve stenosis.  7. The inferior vena cava is dilated in size with >50% respiratory variability, suggesting right atrial pressure of 8 mmHg. Comparison(s): LVEF is slight worse, LA size has increased. FINDINGS  Left  Ventricle: Left ventricular ejection fraction, by estimation, is 45 to 50%. The left ventricle has mildly decreased function. The left ventricle demonstrates global hypokinesis. The left ventricular internal cavity size

## 2021-08-26 NOTE — Progress Notes (Incomplete)
Patient just left unit via strecther with [facility] ?

## 2021-08-26 NOTE — Progress Notes (Signed)
[facility] notified & spoke with Lauren & transport to [facility] requested, Lauren reported it may be awhile due to multiple transports scheduled prior to this patient request  ?

## 2021-08-26 NOTE — Progress Notes (Signed)
Called [facility] & report on patient given to receiving nurse Mickel Baas)  ?

## 2021-08-26 NOTE — Assessment & Plan Note (Signed)
--  discussed with cardiology and planning for transfer to [facility] for cath  ?

## 2021-08-27 ENCOUNTER — Other Ambulatory Visit (HOSPITAL_COMMUNITY): Payer: Self-pay

## 2021-08-27 ENCOUNTER — Encounter (HOSPITAL_COMMUNITY)
Admission: EM | Disposition: A | Discharge status: Home / Self Care | Payer: Self-pay | Source: Home / Self Care | Attending: Family Medicine

## 2021-08-27 ENCOUNTER — Encounter (HOSPITAL_COMMUNITY): Payer: Self-pay | Admitting: Cardiology

## 2021-08-27 ENCOUNTER — Encounter (HOSPITAL_COMMUNITY): Payer: Self-pay | Admitting: Hematology

## 2021-08-27 DIAGNOSIS — R079 Chest pain, unspecified: Secondary | ICD-10-CM

## 2021-08-27 HISTORY — PX: LEFT HEART CATH AND CORONARY ANGIOGRAPHY: CATH118249

## 2021-08-27 LAB — BASIC METABOLIC PANEL
Anion gap: 7 (ref 5–15)
BUN: 15 mg/dL (ref 6–20)
CO2: 25 mmol/L (ref 22–32)
Calcium: 8.9 mg/dL (ref 8.9–10.3)
Chloride: 102 mmol/L (ref 98–111)
Creatinine, Ser: 0.92 mg/dL (ref 0.61–1.24)
GFR, Estimated: >60 mL/min (ref >60)
Glucose, Bld: 165 mg/dL — ABNORMAL HIGH (ref 70–99)
Potassium: 3.8 mmol/L (ref 3.5–5.1)
Sodium: 134 mmol/L — ABNORMAL LOW (ref 135–145)

## 2021-08-27 LAB — CBC
HCT: 37.8 % — ABNORMAL LOW (ref 39.0–52.0)
Hemoglobin: 12.1 g/dL — ABNORMAL LOW (ref 13.0–17.0)
MCH: 29.5 pg (ref 26.0–34.0)
MCHC: 32 g/dL (ref 30.0–36.0)
MCV: 92.2 fL (ref 80.0–100.0)
Platelets: 151 10*3/uL (ref 150–400)
RBC: 4.1 MIL/uL — ABNORMAL LOW (ref 4.22–5.81)
RDW: 15.3 % (ref 11.5–15.5)
WBC: 7.4 10*3/uL (ref 4.0–10.5)
nRBC: 0 % (ref 0.0–0.2)

## 2021-08-27 LAB — GLUCOSE, CAPILLARY
Glucose-Capillary: 155 mg/dL — ABNORMAL HIGH (ref 70–99)
Glucose-Capillary: 158 mg/dL — ABNORMAL HIGH (ref 70–99)
Glucose-Capillary: 181 mg/dL — ABNORMAL HIGH (ref 70–99)
Glucose-Capillary: 86 mg/dL (ref 70–99)

## 2021-08-27 SURGERY — LEFT HEART CATH AND CORONARY ANGIOGRAPHY
Anesthesia: LOCAL

## 2021-08-27 MED ORDER — VERAPAMIL HCL 2.5 MG/ML IV SOLN
INTRAVENOUS | Status: AC
  Filled 2021-08-27: qty 2

## 2021-08-27 MED ORDER — SODIUM CHLORIDE 0.9% FLUSH: 3.0000 mL | INTRAVENOUS | Status: DC | PRN

## 2021-08-27 MED ORDER — APIXABAN 5 MG PO TABS
5.0000 mg | ORAL_TABLET | Freq: Two times a day (BID) | ORAL | Status: DC
  Administered 2021-08-27 – 2021-08-28 (×2): 5 mg via ORAL
  Filled 2021-08-27 (×2): qty 1

## 2021-08-27 MED ORDER — LIDOCAINE HCL (PF) 1 % IJ SOLN
INTRAMUSCULAR | Status: AC
  Filled 2021-08-27: qty 30

## 2021-08-27 MED ORDER — MIDAZOLAM HCL 2 MG/2ML IJ SOLN
INTRAMUSCULAR | Status: AC
  Filled 2021-08-27: qty 2

## 2021-08-27 MED ORDER — SODIUM CHLORIDE 0.9 % WEIGHT BASED INFUSION
1.0000 mL/kg/h | INTRAVENOUS | Status: AC
  Administered 2021-08-27: 1 mL/kg/h via INTRAVENOUS

## 2021-08-27 MED ORDER — HEPARIN SODIUM (PORCINE) 1000 UNIT/ML IJ SOLN
INTRAMUSCULAR | Status: DC | PRN
Start: 2021-08-27 — End: 2021-08-27
  Administered 2021-08-27: 5000 [IU] via INTRAVENOUS

## 2021-08-27 MED ORDER — SODIUM CHLORIDE 0.9% FLUSH
3.0000 mL | Freq: Two times a day (BID) | INTRAVENOUS | Status: DC
  Administered 2021-08-27: 3 mL via INTRAVENOUS

## 2021-08-27 MED ORDER — MIDAZOLAM HCL 2 MG/2ML IJ SOLN
INTRAMUSCULAR | Status: DC | PRN
Start: 2021-08-27 — End: 2021-08-27
  Administered 2021-08-27: 2 mg via INTRAVENOUS

## 2021-08-27 MED ORDER — HEPARIN (PORCINE) IN NACL 1000-0.9 UT/500ML-% IV SOLN
INTRAVENOUS | Status: DC | PRN
  Administered 2021-08-27 (×2): 500 mL

## 2021-08-27 MED ORDER — NICOTINE 21 MG/24HR TD PT24
21.0000 mg | MEDICATED_PATCH | Freq: Every day | TRANSDERMAL | Status: DC
  Administered 2021-08-27 – 2021-08-28 (×2): 21 mg via TRANSDERMAL
  Filled 2021-08-27 (×2): qty 1

## 2021-08-27 MED ORDER — HEPARIN (PORCINE) IN NACL 1000-0.9 UT/500ML-% IV SOLN
INTRAVENOUS | Status: AC
  Filled 2021-08-27: qty 1000

## 2021-08-27 MED ORDER — LIDOCAINE HCL (PF) 1 % IJ SOLN
INTRAMUSCULAR | Status: DC | PRN
  Administered 2021-08-27: 2 mL

## 2021-08-27 MED ORDER — HEPARIN SODIUM (PORCINE) 1000 UNIT/ML IJ SOLN
INTRAMUSCULAR | Status: AC
Start: 2021-08-27 — End: ?
  Filled 2021-08-27: qty 10

## 2021-08-27 MED ORDER — SODIUM CHLORIDE 0.9 % IV SOLN: 250.0000 mL | INTRAVENOUS | Status: DC | PRN

## 2021-08-27 MED ORDER — IOHEXOL 350 MG/ML SOLN
INTRAVENOUS | Status: DC | PRN
Start: 2021-08-27 — End: 2021-08-27
  Administered 2021-08-27: 50 mL via INTRA_ARTERIAL

## 2021-08-27 MED ORDER — FENTANYL CITRATE (PF) 100 MCG/2ML IJ SOLN
INTRAMUSCULAR | Status: DC | PRN
  Administered 2021-08-27: 25 ug via INTRAVENOUS

## 2021-08-27 MED ORDER — VERAPAMIL HCL 2.5 MG/ML IV SOLN
INTRAVENOUS | Status: DC | PRN
  Administered 2021-08-27: 10 mL via INTRA_ARTERIAL

## 2021-08-27 MED ORDER — FENTANYL CITRATE (PF) 100 MCG/2ML IJ SOLN
INTRAMUSCULAR | Status: AC
  Filled 2021-08-27: qty 2

## 2021-08-27 SURGICAL SUPPLY — 11 items
CATH 5FR JL3.5 JR4 ANG PIG MP (CATHETERS) ×1 IMPLANT
DEVICE RAD COMP TR BAND LRG (VASCULAR PRODUCTS) ×1 IMPLANT
GLIDESHEATH SLEND SS 6F .021 (SHEATH) ×1 IMPLANT
GUIDEWIRE INQWIRE 1.5J.035X260 (WIRE) IMPLANT
INQWIRE 1.5J .035X260CM (WIRE) ×2
KIT HEART LEFT (KITS) ×3 IMPLANT
PACK CARDIAC CATHETERIZATION (CUSTOM PROCEDURE TRAY) ×3 IMPLANT
SYR MEDRAD MARK 7 150ML (SYRINGE) ×3 IMPLANT
TRANSDUCER W/STOPCOCK (MISCELLANEOUS) ×3 IMPLANT
TUBING CIL FLEX 10 FLL-RA (TUBING) ×3 IMPLANT
WIRE HI TORQ VERSACORE-J 145CM (WIRE) ×1 IMPLANT

## 2021-08-27 NOTE — Discharge Instructions (Addendum)

## 2021-08-27 NOTE — TOC Benefit Eligibility Note (Signed)
Patient Advocate Encounter ? ?Insurance verification completed.   ? ?The patient is currently admitted and upon discharge could be taking Eliquis 5 mg. ? ?The current 30 day co-pay is, $45.00.  ? ?The patient is insured through [employer]  ? ? ? ?Lyndel Safe, CPhT ?Pharmacy Patient Advocate Specialist ?[place] Patient Advocate Team ?Direct Number: [phone]  Fax: [phone] ? ? ? ? ? ?  ?

## 2021-08-27 NOTE — Interval H&P Note (Signed)
History and Physical Interval Note: ? ?08/27/2021 ?12:04 PM ? ?Stephen Roberts  has presented today for surgery, with the diagnosis of chest pain.  The various methods of treatment have been discussed with the patient and family. After consideration of risks, benefits and other options for treatment, the patient has consented to  Procedure(s): ?LEFT HEART CATH AND CORONARY ANGIOGRAPHY (N/A) as a surgical intervention.  The patient's history has been reviewed, patient examined, no change in status, stable for surgery.  I have reviewed the patient's chart and labs.  Questions were answered to the patient's satisfaction.   ?Cath Lab Visit (complete for each Cath Lab visit) ? ?Clinical Evaluation Leading to the Procedure:  ? ?ACS: No. ? ?Non-ACS:   ? ?Anginal Classification: CCS III ? ?Anti-ischemic medical therapy: Minimal Therapy (1 class of medications) ? ?Non-Invasive Test Results: No non-invasive testing performed ? ?Prior CABG: No previous CABG ? ? ? ? ? ? ? ?Collier [facility] ?08/27/2021 ?12:04 PM ? ? ? ?

## 2021-08-27 NOTE — Progress Notes (Signed)
Pt endorsing pain at IV site. Assessment shows 3 pieces of tape on/below the catheter hub, no tegaderm covering catheter entry point. Site is pink, tender, and warm. Pt endorses he did remove a piece of "tape" because it was bothering him. Cannot confirm if that was tape or tegaderm. Site amply cleaned with alcohol pad and catheter removed.  ? ?[place] MD paged requesting more information on desired IV access locations, as entire left forearm may be compromised. Pt endorses that [facility] had " a time" getting IV started. Will put in IV consult pending [place]ique MD recommendations.  ?

## 2021-08-27 NOTE — Progress Notes (Addendum)
Inpatient Diabetes Program Recommendations ? ?AACE/ADA: New Consensus Statement on Inpatient Glycemic Control  ? ?Target Ranges:  Prepandial:   less than 140 mg/dL ?     Peak postprandial:   less than 180 mg/dL (1-2 hours) ?     Critically ill patients:  140 - 180 mg/dL  ? ? Latest Reference Range & Units 07/17/20 12:35 08/24/21 09:45  ?Hemoglobin A1C 4.8 - 5.6 % 6.4 (H) 8.0 (H)  ? ?Review of Glycemic Control ? ?Diabetes history: DM2 ?Outpatient Diabetes medications: None; diet controlled (has been on Metformin in past) ?Current orders for Inpatient glycemic control: Novolog 0-15 units TID with meals, Novolog 0-5 units QHS ? ?Inpatient Diabetes Program Recommendations:   ? ?Oral DM medication: May want to consider discharging patient on Metformin XR (patient reports upset stomach with regular Metformin but willing to try XR if prescribed) and have patient follow up with PCP. ? ?HbgA1C:  A1C 8.0% on 08/24/21 indicating an average glucose of 183 mg/dl over the past 2-3 months. ? ?NOTE: Noted consult for diabetes coordinator. Chart reviewed. Patient admitted with a-fib with RVR. Per H&P on 08/24/21, patient went to urgent care on 07/21/21 with palpitations and was prescribed Augmentin and Prednisone. Current A1C 8% on 08/24/21 (prior A1C 6.4% on 07/17/20). Anticipate recent steroids impacting current A1C. In reviewing chart, noted PCP note on 06/30/21 which notes patient has DM2 hx (diet controlled) and patient has been on Metformin in the past. Attending provider may want to consider discharging patient on Metformin if not contraindicated and have patient follow up with PCP regarding DM control. ? ?Addendum 08/27/21'@11'$ :35-Spoke with patient and his wife at bedside regarding DM. Patient states he was diagnosed with DM several years ago and that he use to take Metformin but his glucose control got much better and he was able to stop Metformin. Patient reports that his glucose is usually okay on labs. Discussed that A1C was 6.4%  on 07/17/20 and current A1C 8% on 08/24/21 indicating an average glucose of 183 mg/dl over the past 2-3 months. Patient confirms that he was recently taking Prednisone prescribed on 07/21/21. Explained that recent Prednisone use has likely contributed to elevated A1C.  Patient states he mainly drinks diet soda but he eats whatever he wants. Discussed Carb Modified diet and encouraged patient to start looking at nutrition labels on food and drinks and target for 50-75 grams of carbs per meal. Patient reports he eats a lot of rice in large portions. Patient states he will work on cutting back on carbohydrates. Discussed A1C and glucose goals. Informed patient that the attending may decide to discharge him on DM medication. Patient reports that the Metformin upset his stomach. Informed patient that I would note that Metformin upset his stomach and would recommend trying Metformin XR if prescribed at discharge. Patient agreeable to try Metformin XR if prescribed at discharge. Asked that he follow up with PCP regarding DM control and have A1C repeated in 3-4 months.  Patient verbalized understanding of information and states he has no questions at this time related to DM. ? ?Thanks, ?Barnie Alderman, RN, MSN, CDE ?Diabetes Coordinator ?Inpatient Diabetes Program ?[phone] (Team Pager from 8am to 5pm) ? ? ? ?

## 2021-08-27 NOTE — Progress Notes (Addendum)
? ?Progress Note ? ?Patient Name: Stephen Roberts ?Date of Encounter: 08/27/2021 ? ?[facility] Cardiologist: None  ? ?Subjective  ? ?Pt denies CP   Breaghing is OK at rest  ?Inpatient Medications  ?  ?Scheduled Meds: ? Chlorhexidine Gluconate Cloth  6 each Topical Q0600  ? ferrous sulfate  325 mg Oral BID  ? insulin aspart  0-15 Units Subcutaneous TID WC  ? insulin aspart  0-5 Units Subcutaneous QHS  ? insulin aspart  3 Units Subcutaneous TID WC  ? metoprolol succinate  50 mg Oral Daily  ? pantoprazole  40 mg Oral Daily  ? rosuvastatin  20 mg Oral Daily  ? sodium chloride flush  3 mL Intravenous Q12H  ? ?Continuous Infusions: ? sodium chloride    ? sodium chloride 10 mL/hr at 08/27/21 0644  ? ? ?PRN Meds: ?sodium chloride, acetaminophen **OR** acetaminophen, ondansetron **OR** ondansetron (ZOFRAN) IV, oxyCODONE-acetaminophen, sodium chloride flush  ? ?Vital Signs  ?  ?Vitals:  ? 08/26/21 2004 08/26/21 1517 08/27/21 0311 08/27/21 6160  ?BP: 120/80 120/69 132/83 (!) 134/97  ?Pulse: 67 79 77 89  ?Resp: '19 15 19 20  '$ ?Temp: 98.2 ?F (36.8 ?C) 98.2 ?F (36.8 ?C) 98.1 ?F (36.7 ?C) 98.4 ?F (36.9 ?C)  ?TempSrc: Oral Oral Oral Oral  ?SpO2: 92% 95% 94% 93%  ?Weight:    102.6 kg  ?Height:      ? ? ?Intake/Output Summary (Last 24 hours) at 08/27/2021 0742 ?Last data filed at 08/27/[phone] [id number] ?Gross per 24 hour  ?Intake 483 ml  ?Output 2 ml  ?Net 481 ml  ? ? ?  08/27/2021  ?  6:11 AM 08/25/2021  ?  5:14 AM 08/24/2021  ?  9:14 AM  ?Last 3 Weights  ?Weight (lbs) 226 lb 3.1 oz 229 lb 8 oz 233 lb  ?Weight (kg) 102.6 kg 104.1 kg 105.688 kg  ?   ? ?Telemetry  ?  ?Afib 50-80s - Personally Reviewed ? ?ECG  ?  ?No new tracing today - Personally Reviewed ? ?Physical Exam  ? ?GEN: No acute distress.   ?Neck: No JVD ?Cardiac: Irregularly, irregular ?Respiratory: Moving air   Mild wheezing  ?GI: Soft, nontender, non-distended  ?MS: No edema; No deformity. ?Neuro:  Nonfocal  ?Psych: Normal affect  ? ?Labs  ?  ?High Sensitivity Troponin:   ?Recent  Labs  ?Lab 08/24/21 ?0945 08/24/21 ?1105  ?TROPONINIHS 14 13  ?   ?Chemistry ?Recent Labs  ?Lab 08/24/21 ?0945 08/25/21 ?0626 08/27/21 ?0032  ?NA 137  --  134*  ?K 4.2  --  3.8  ?CL 104  --  102  ?CO2 22  --  25  ?GLUCOSE 159*  --  165*  ?BUN 15  --  15  ?CREATININE 0.66  --  0.92  ?CALCIUM 9.3  --  8.9  ?MG  --  1.8  --   ?GFRNONAA >60  --  >60  ?ANIONGAP 11  --  7  ?  ?Lipids No results for input(s): CHOL, TRIG, HDL, LABVLDL, LDLCALC, CHOLHDL in the last 168 hours.  ?Hematology ?Recent Labs  ?Lab 08/24/21 ?0945 08/27/21 ?0032  ?WBC 7.2 7.4  ?RBC 4.37 4.10*  ?HGB 13.1 12.1*  ?HCT 40.3 37.8*  ?MCV 92.2 92.2  ?MCH 30.0 29.5  ?MCHC 32.5 32.0  ?RDW 15.4 15.3  ?PLT 162 151  ? ?Thyroid  ?Recent Labs  ?Lab 08/24/21 ?0945  ?TSH 1.900  ?  ?BNPNo results for input(s): BNP, PROBNP in the last 168 hours.  ?  DDimer No results for input(s): DDIMER in the last 168 hours.  ? ?Radiology  ?  ?ECHOCARDIOGRAM COMPLETE ? ?Result Date: 08/25/2021 ?   ECHOCARDIOGRAM REPORT   Patient Name:   Stephen Roberts Date of Exam: 08/25/2021 Medical Rec #:  [id number]        Height:       69.0 in Accession #:    [id number]       Weight:       229.5 lb Date of Birth:  Sep 19, 1965        BSA:          2.190 m? Patient Age:    56 years         BP:           135/82 mmHg Patient Gender: M                HR:           94 bpm. Exam Location:  [facility] Procedure: 2D Echo, Cardiac Doppler and Color Doppler Indications:    Atrial Fibrillation  History:        Patient has prior history of Echocardiogram examinations, most                 recent 03/25/2020. COPD, Arrythmias:Atrial Fibrillation; Risk                 Factors:Hypertension, Diabetes, Dyslipidemia and Current Smoker.  Sonographer:    Wenda Low Referring Phys: [phone] DAVID TAT  Sonographer Comments: Image acquisition challenging due to COPD. IMPRESSIONS  1. Left ventricular ejection fraction, by estimation, is 45 to 50%. The left ventricle has mildly decreased function. The left ventricle  demonstrates global hypokinesis. There is mild left ventricular hypertrophy. Left ventricular diastolic parameters are indeterminate.  2. Right ventricular systolic function is normal. The right ventricular size is normal. There is normal pulmonary artery systolic pressure.  3. Left atrial size was severely dilated.  4. Right atrial size was moderately dilated.  5. The mitral valve is normal in structure. Trivial mitral valve regurgitation. No evidence of mitral stenosis.  6. The aortic valve is tricuspid. Aortic valve regurgitation is not visualized. Aortic valve sclerosis is present, with no evidence of aortic valve stenosis.  7. The inferior vena cava is dilated in size with >50% respiratory variability, suggesting right atrial pressure of 8 mmHg. Comparison(s): LVEF is slight worse, LA size has increased. FINDINGS  Left Ventricle: Left ventricular ejection fraction, by estimation, is 45 to 50%. The left ventricle has mildly decreased function. The left ventricle demonstrates global hypokinesis. The left ventricular internal cavity size was normal in size. There is  mild left ventricular hypertrophy. Left ventricular diastolic function could not be evaluated due to atrial fibrillation. Left ventricular diastolic parameters are indeterminate. Right Ventricle: The right ventricular size is normal. No increase in right ventricular wall thickness. Right ventricular systolic function is normal. There is normal pulmonary artery systolic pressure. The tricuspid regurgitant velocity is 2.52 m/s, and  with an assumed right atrial pressure of 8 mmHg, the estimated right ventricular systolic pressure is 70.1 mmHg. Left Atrium: Left atrial size was severely dilated. Right Atrium: Right atrial size was moderately dilated. Pericardium: There is no evidence of pericardial effusion. Mitral Valve: The mitral valve is normal in structure. Trivial mitral valve regurgitation. No evidence of mitral valve stenosis. MV peak gradient,  4.7 mmHg. The mean mitral valve gradient is 1.5 mmHg. Tricuspid Valve: The tricuspid valve is normal in structure. Tricuspid  valve regurgitation is trivial. No evidence of tricuspid stenosis. Aortic Valve: The aortic valve is tricuspid. Aortic valve regurgitation is not visualized. Aortic valve sclerosis is present, with no evidence of aortic valve stenosis. Aortic valve mean gradient measures 6.0 mmHg. Aortic valve peak gradient measures 9.5  mmHg. Aortic valve area, by VTI measures 2.15 cm?. Pulmonic Valve: The pulmonic valve was not well visualized. Pulmonic valve regurgitation is not visualized. No evidence of pulmonic stenosis. Aorta: The aortic root and ascending aorta are structurally normal, with no evidence of dilitation. Venous: The inferior vena cava is dilated in size with greater than 50% respiratory variability, suggesting right atrial pressure of 8 mmHg. IAS/Shunts: No atrial level shunt detected by color flow Doppler.  LEFT VENTRICLE PLAX 2D LVIDd:         5.50 cm   Diastology LVIDs:         4.00 cm   LV e' medial:    10.00 cm/s LV PW:         1.25 cm   LV E/e' medial:  11.4 LV IVS:        1.35 cm   LV e' lateral:   10.30 cm/s LVOT diam:     2.00 cm   LV E/e' lateral: 11.1 LV SV:         62 LV SV Index:   28 LVOT Area:     3.14 cm?  RIGHT VENTRICLE RV Basal diam:  4.20 cm RV Mid diam:    3.60 cm LEFT ATRIUM              Index        RIGHT ATRIUM           Index LA diam:        5.40 cm  2.47 cm/m?   RA Area:     25.70 cm? LA Vol (A2C):   111.0 ml 50.68 ml/m?  RA Volume:   92.50 ml  42.23 ml/m? LA Vol (A4C):   120.0 ml 54.78 ml/m? LA Biplane Vol: 122.0 ml 55.70 ml/m?  AORTIC VALVE                     PULMONIC VALVE AV Area (Vmax):    2.28 cm?      PV Vmax:       1.07 m/s AV Area (Vmean):   1.94 cm?      PV Peak grad:  4.6 mmHg AV Area (VTI):     2.15 cm? AV Vmax:           154.50 cm/s AV Vmean:          110.500 cm/s AV VTI:            0.288 m AV Peak Grad:      9.5 mmHg AV Mean Grad:      6.0 mmHg LVOT  Vmax:         112.15 cm/s LVOT Vmean:        68.100 cm/s LVOT VTI:          0.198 m LVOT/AV VTI ratio: 0.69  AORTA Ao Root diam: 3.40 cm Ao Asc diam:  3.20 cm MITRAL VALVE                TRICUSPID VALVE MV A

## 2021-08-27 NOTE — Progress Notes (Signed)
Reivewed cath results   No significant CAD  ? ?Ambulate tonight    ?Eliquis started     ? ?Plan for cardioversion 3-4 wks from starting      ?D/C in am ? ?Dorris Carnes MD  ?

## 2021-08-27 NOTE — Progress Notes (Signed)
Asked by Dr. Harrington Challenger to arrange f/u for pt - no appt availability in [place] during timeframe needed; spoke with pt by phone and he agrees to come to [facility] location - appt info outlined on AVS for 5/2. ?

## 2021-08-27 NOTE — H&P (View-Only) (Signed)
? ?Progress Note ? ?Patient Name: Stephen Roberts ?Date of Encounter: 08/27/2021 ? ?[facility] Cardiologist: None  ? ?Subjective  ? ?Pt denies CP   Breaghing is OK at rest  ?Inpatient Medications  ?  ?Scheduled Meds: ? Chlorhexidine Gluconate Cloth  6 each Topical Q0600  ? ferrous sulfate  325 mg Oral BID  ? insulin aspart  0-15 Units Subcutaneous TID WC  ? insulin aspart  0-5 Units Subcutaneous QHS  ? insulin aspart  3 Units Subcutaneous TID WC  ? metoprolol succinate  50 mg Oral Daily  ? pantoprazole  40 mg Oral Daily  ? rosuvastatin  20 mg Oral Daily  ? sodium chloride flush  3 mL Intravenous Q12H  ? ?Continuous Infusions: ? sodium chloride    ? sodium chloride 10 mL/hr at 08/27/21 0644  ? ? ?PRN Meds: ?sodium chloride, acetaminophen **OR** acetaminophen, ondansetron **OR** ondansetron (ZOFRAN) IV, oxyCODONE-acetaminophen, sodium chloride flush  ? ?Vital Signs  ?  ?Vitals:  ? 08/26/21 2004 08/26/21 9702 08/27/21 0311 08/27/21 6378  ?BP: 120/80 120/69 132/83 (!) 134/97  ?Pulse: 67 79 77 89  ?Resp: '19 15 19 20  '$ ?Temp: 98.2 ?F (36.8 ?C) 98.2 ?F (36.8 ?C) 98.1 ?F (36.7 ?C) 98.4 ?F (36.9 ?C)  ?TempSrc: Oral Oral Oral Oral  ?SpO2: 92% 95% 94% 93%  ?Weight:    102.6 kg  ?Height:      ? ? ?Intake/Output Summary (Last 24 hours) at 08/27/2021 0742 ?Last data filed at 08/27/2021 [phone] ?Gross per 24 hour  ?Intake 483 ml  ?Output 2 ml  ?Net 481 ml  ? ? ?  08/27/2021  ?  6:11 AM 08/25/2021  ?  5:14 AM 08/24/2021  ?  9:14 AM  ?Last 3 Weights  ?Weight (lbs) 226 lb 3.1 oz 229 lb 8 oz 233 lb  ?Weight (kg) 102.6 kg 104.1 kg 105.688 kg  ?   ? ?Telemetry  ?  ?Afib 50-80s - Personally Reviewed ? ?ECG  ?  ?No new tracing today - Personally Reviewed ? ?Physical Exam  ? ?GEN: No acute distress.   ?Neck: No JVD ?Cardiac: Irregularly, irregular ?Respiratory: Moving air   Mild wheezing  ?GI: Soft, nontender, non-distended  ?MS: No edema; No deformity. ?Neuro:  Nonfocal  ?Psych: Normal affect  ? ?Labs  ?  ?High Sensitivity Troponin:   ?Recent  Labs  ?Lab 08/24/21 ?0945 08/24/21 ?1105  ?TROPONINIHS 14 13  ?   ?Chemistry ?Recent Labs  ?Lab 08/24/21 ?0945 08/25/21 ?0277 08/27/21 ?0032  ?NA 137  --  134*  ?K 4.2  --  3.8  ?CL 104  --  102  ?CO2 22  --  25  ?GLUCOSE 159*  --  165*  ?BUN 15  --  15  ?CREATININE 0.66  --  0.92  ?CALCIUM 9.3  --  8.9  ?MG  --  1.8  --   ?GFRNONAA >60  --  >60  ?ANIONGAP 11  --  7  ?  ?Lipids No results for input(s): CHOL, TRIG, HDL, LABVLDL, LDLCALC, CHOLHDL in the last 168 hours.  ?Hematology ?Recent Labs  ?Lab 08/24/21 ?0945 08/27/21 ?0032  ?WBC 7.2 7.4  ?RBC 4.37 4.10*  ?HGB 13.1 12.1*  ?HCT 40.3 37.8*  ?MCV 92.2 92.2  ?MCH 30.0 29.5  ?MCHC 32.5 32.0  ?RDW 15.4 15.3  ?PLT 162 151  ? ?Thyroid  ?Recent Labs  ?Lab 08/24/21 ?0945  ?TSH 1.900  ?  ?BNPNo results for input(s): BNP, PROBNP in the last 168 hours.  ?  DDimer No results for input(s): DDIMER in the last 168 hours.  ? ?Radiology  ?  ?ECHOCARDIOGRAM COMPLETE ? ?Result Date: 08/25/2021 ?   ECHOCARDIOGRAM REPORT   Patient Name:   Stephen Roberts Date of Exam: 08/25/2021 Medical Rec #:  [id number]        Height:       69.0 in Accession #:    [id number]       Weight:       229.5 lb Date of Birth:  [birth year]        BSA:          2.190 m? Patient Age:    56 years         BP:           135/82 mmHg Patient Gender: M                HR:           94 bpm. Exam Location:  [facility] Procedure: 2D Echo, Cardiac Doppler and Color Doppler Indications:    Atrial Fibrillation  History:        Patient has prior history of Echocardiogram examinations, most                 recent 03/25/2020. COPD, Arrythmias:Atrial Fibrillation; Risk                 Factors:Hypertension, Diabetes, Dyslipidemia and Current Smoker.  Sonographer:    Wenda Low Referring Phys: [id number] DAVID TAT  Sonographer Comments: Image acquisition challenging due to COPD. IMPRESSIONS  1. Left ventricular ejection fraction, by estimation, is 45 to 50%. The left ventricle has mildly decreased function. The left ventricle  demonstrates global hypokinesis. There is mild left ventricular hypertrophy. Left ventricular diastolic parameters are indeterminate.  2. Right ventricular systolic function is normal. The right ventricular size is normal. There is normal pulmonary artery systolic pressure.  3. Left atrial size was severely dilated.  4. Right atrial size was moderately dilated.  5. The mitral valve is normal in structure. Trivial mitral valve regurgitation. No evidence of mitral stenosis.  6. The aortic valve is tricuspid. Aortic valve regurgitation is not visualized. Aortic valve sclerosis is present, with no evidence of aortic valve stenosis.  7. The inferior vena cava is dilated in size with >50% respiratory variability, suggesting right atrial pressure of 8 mmHg. Comparison(s): LVEF is slight worse, LA size has increased. FINDINGS  Left Ventricle: Left ventricular ejection fraction, by estimation, is 45 to 50%. The left ventricle has mildly decreased function. The left ventricle demonstrates global hypokinesis. The left ventricular internal cavity size was normal in size. There is  mild left ventricular hypertrophy. Left ventricular diastolic function could not be evaluated due to atrial fibrillation. Left ventricular diastolic parameters are indeterminate. Right Ventricle: The right ventricular size is normal. No increase in right ventricular wall thickness. Right ventricular systolic function is normal. There is normal pulmonary artery systolic pressure. The tricuspid regurgitant velocity is 2.52 m/s, and  with an assumed right atrial pressure of 8 mmHg, the estimated right ventricular systolic pressure is 76.1 mmHg. Left Atrium: Left atrial size was severely dilated. Right Atrium: Right atrial size was moderately dilated. Pericardium: There is no evidence of pericardial effusion. Mitral Valve: The mitral valve is normal in structure. Trivial mitral valve regurgitation. No evidence of mitral valve stenosis. MV peak gradient,  4.7 mmHg. The mean mitral valve gradient is 1.5 mmHg. Tricuspid Valve: The tricuspid valve is normal in structure. Tricuspid  valve regurgitation is trivial. No evidence of tricuspid stenosis. Aortic Valve: The aortic valve is tricuspid. Aortic valve regurgitation is not visualized. Aortic valve sclerosis is present, with no evidence of aortic valve stenosis. Aortic valve mean gradient measures 6.0 mmHg. Aortic valve peak gradient measures 9.5  mmHg. Aortic valve area, by VTI measures 2.15 cm?. Pulmonic Valve: The pulmonic valve was not well visualized. Pulmonic valve regurgitation is not visualized. No evidence of pulmonic stenosis. Aorta: The aortic root and ascending aorta are structurally normal, with no evidence of dilitation. Venous: The inferior vena cava is dilated in size with greater than 50% respiratory variability, suggesting right atrial pressure of 8 mmHg. IAS/Shunts: No atrial level shunt detected by color flow Doppler.  LEFT VENTRICLE PLAX 2D LVIDd:         5.50 cm   Diastology LVIDs:         4.00 cm   LV e' medial:    10.00 cm/s LV PW:         1.25 cm   LV E/e' medial:  11.4 LV IVS:        1.35 cm   LV e' lateral:   10.30 cm/s LVOT diam:     2.00 cm   LV E/e' lateral: 11.1 LV SV:         62 LV SV Index:   28 LVOT Area:     3.14 cm?  RIGHT VENTRICLE RV Basal diam:  4.20 cm RV Mid diam:    3.60 cm LEFT ATRIUM              Index        RIGHT ATRIUM           Index LA diam:        5.40 cm  2.47 cm/m?   RA Area:     25.70 cm? LA Vol (A2C):   111.0 ml 50.68 ml/m?  RA Volume:   92.50 ml  42.23 ml/m? LA Vol (A4C):   120.0 ml 54.78 ml/m? LA Biplane Vol: 122.0 ml 55.70 ml/m?  AORTIC VALVE                     PULMONIC VALVE AV Area (Vmax):    2.28 cm?      PV Vmax:       1.07 m/s AV Area (Vmean):   1.94 cm?      PV Peak grad:  4.6 mmHg AV Area (VTI):     2.15 cm? AV Vmax:           154.50 cm/s AV Vmean:          110.500 cm/s AV VTI:            0.288 m AV Peak Grad:      9.5 mmHg AV Mean Grad:      6.0 mmHg LVOT  Vmax:         112.15 cm/s LVOT Vmean:        68.100 cm/s LVOT VTI:          0.198 m LVOT/AV VTI ratio: 0.69  AORTA Ao Root diam: 3.40 cm Ao Asc diam:  3.20 cm MITRAL VALVE                TRICUSPID VALVE MV A

## 2021-08-27 NOTE — Progress Notes (Signed)
? Stephen Roberts  WJX:[id number] DOB: 04-Sep-1965 DOA: 08/24/2021 ?PCP: Lindell Spar, MD   ? ?Brief Narrative:  ?56 year old with a history of HTN, DM 2 diet controlled, iron deficiency anemia, Warthin's tumor, and tobacco abuse who presented to the [facility] ED with a 40-monthhistory of intermittent palpitations shortness of breath and chest discomfort with generalized fatigue and DOE.  He first presented to an urgent care, where an EKG noted atrial fibrillation with RVR.  He was therefore transferred to the ED. ? ?[facility] and his [facility]VR was able to be controlled.  Despite this the patient had persisting symptoms of chest pain and dyspnea.  Cardiology was consulted, [facility]. ? ?[facility]  [facility] ? ?Code Status: FULL CODE ? ?DVT prophylaxis: ?Eliquis ? ?Interim Hx: ?Afebrile.  Heart rate controlled.  Vital signs stable.  [facility]  Continues to have some ongoing intermittent chest discomfort.  Denies fevers chills nausea or vomiting.  Is very hungry. ? ?Assessment & Plan: ? ?Newly diagnosed atrial fibrillation with acute RVR ?History suggest this has likely been going on for 1-2 months -CHA2DS2-VASc is 3 -apixaban initiated but currently on hold for cardiac cath -metoprolol being utilized for rate control at this time with patient initially requiring diltiazem drip -TSH normal -TTE noted reduced EF of 40-45% with global hypokinesis and LVH ? ?Intermittent chest pain ?To undergo cardiac cath today ? ?Iron deficiency anemia due to occult small bowel bleeding (NSAID related) and malabsorption (possibly due to PPI) ?Iron studies showing persistent low iron levels -received iron infusion 2 months ago -ferritin 12 with iron low at 34 and normal TIBC of 404 ? ?HTN ?Was not on any prescription medications at time of admission -presently on  metoprolol ? ?DM2 ?Previously diet controlled -A1c in March of last year 6.41 -A1c July 2019 7.2 -has been on metformin in the past but not of late -A1c 8.0 this admission - monitor with diet resumption ? ?Tobacco abuse ?Has been counseled on absolute need to stop smoking ? ? ?Family Communication: Spoke with patient and family at bedside ?Disposition: From home -anticipate return home after medical evaluation completed ? ?Objective: ?Blood pressure (!) 133/103, pulse 79, temperature 98.4 ?F (36.9 ?C), temperature source Oral, resp. rate (!) 22, height '5\' 9"'$  (1.753 m), weight 102.6 kg, SpO2 93 %. ? ?Intake/Output Summary (Last 24 hours) at 08/27/2021 02130?Last data filed at 08/27/2021 [phone]?Gross per 24 hour  ?Intake 483 ml  ?Output 2 ml  ?Net 481 ml  ? ?Filed Weights  ? 08/24/21 0914 08/25/21 0514 08/27/21 0611  ?Weight: 105.7 kg 104.1 kg 102.6 kg  ? ? ?Examination: ?General: No acute respiratory distress ?Lungs: Clear to auscultation bilaterally without wheezes or crackles ?Cardiovascular: Regular rate with a regular rhythm ?Abdomen: Nontender, nondistended, soft, bowel sounds positive, no rebound, no ascites, no appreciable mass ?Extremities: No significant cyanosis, clubbing, or edema bilateral lower extremities ? ?CBC: ?Recent Labs  ?Lab 08/24/21 ?0945 08/27/21 ?0032  ?WBC 7.2 7.4  ?HGB 13.1 12.1*  ?HCT 40.3 37.8*  ?MCV 92.2 92.2  ?PLT 162 151  ? ?Basic Metabolic Panel: ?Recent Labs  ?Lab 08/24/21 ?0945 08/25/21 ?0846904/14/23 ?0032  ?NA 137  --  134*  ?K 4.2  --  3.8  ?CL 104  --  102  ?CO2 22  --  25  ?GLUCOSE 159*  --  165*  ?BUN 15  --  15  ?CREATININE 0.66  --  0.92  ?CALCIUM 9.3  --  8.9  ?MG  --  1.8  --   ? ?GFR: ?Estimated Creatinine Clearance: 104.6 mL/min (by C-G formula based on SCr of 0.92 mg/dL). ? ? ?HbA1C: ?Hgb A1c MFr Bld  ?Date/Time Value Ref Range Status  ?08/24/2021 09:45 AM 8.0 (H) 4.8 - 5.6 % Final  ?  Comment:  ?  (NOTE) ?Pre diabetes:          5.7%-6.4% ? ?Diabetes:               >6.4% ? ?Glycemic control for   <7.0% ?adults with diabetes ?  ?07/17/2020 12:35 PM 6.4 (H) <5.7 % of total Hgb Final  ?  Comment:  ?  For someone without known diabetes, a hemoglobin  ?A1c value between 5.7% and 6.4% is consistent with ?prediabetes and should be confirmed with a  ?follow-up test. ?. ?For someone with known diabetes, a value <7% ?indicates that their diabetes is well controlled. A1c ?targets should be individualized based on duration of ?diabetes, age, comorbid conditions, and other ?considerations. ?. ?This assay result is consistent with an increased risk ?of diabetes. ?. ?Currently, no consensus exists regarding use of ?hemoglobin A1c for diagnosis of diabetes for children. ?. ?  ? ? ?CBG: ?Recent Labs  ?Lab 08/26/21 ?1118 08/26/21 ?1600 08/26/21 ?2104 08/27/21 ?0309 08/27/21 ?0610  ?GLUCAP 137* 131* 132* 155* 158*  ? ? ?Scheduled Meds: ? Chlorhexidine Gluconate Cloth  6 each Topical Q0600  ? ferrous sulfate  325 mg Oral BID  ? insulin aspart  0-15 Units Subcutaneous TID WC  ? insulin aspart  0-5 Units Subcutaneous QHS  ? insulin aspart  3 Units Subcutaneous TID WC  ? metoprolol succinate  50 mg Oral Daily  ? pantoprazole  40 mg Oral Daily  ? rosuvastatin  20 mg Oral Daily  ? sodium chloride flush  3 mL Intravenous Q12H  ? ?Continuous Infusions: ? sodium chloride    ? sodium chloride Stopped (08/27/21 0750)  ? ? ? LOS: 2 days  ? ?Cherene Altes, MD ?[facility] ?Office  [phone] ?Pager - Text Page per Shea Evans ? ?If 7PM-7AM, please contact night-coverage per Amion ?08/27/2021, 9:22 AM ? ? ? ? ?

## 2021-08-28 LAB — GLUCOSE, CAPILLARY
Glucose-Capillary: 139 mg/dL — ABNORMAL HIGH (ref 70–99)
Glucose-Capillary: 124 mg/dL — ABNORMAL HIGH (ref 70–99)
Glucose-Capillary: 130 mg/dL — ABNORMAL HIGH (ref 70–99)

## 2021-08-28 MED ORDER — APIXABAN 5 MG PO TABS: 5.0000 mg | ORAL_TABLET | Freq: Two times a day (BID) | ORAL | 1 refills | Status: DC

## 2021-08-28 MED ORDER — METFORMIN HCL 500 MG PO TABS: 500.0000 mg | ORAL_TABLET | Freq: Two times a day (BID) | ORAL | 1 refills | Status: DC

## 2021-08-28 MED ORDER — LOSARTAN POTASSIUM 25 MG PO TABS: 25.0000 mg | ORAL_TABLET | Freq: Every day | ORAL | 1 refills | Status: DC

## 2021-08-28 MED ORDER — NICOTINE 21 MG/24HR TD PT24
21.0000 mg | MEDICATED_PATCH | Freq: Every day | TRANSDERMAL | 0 refills | Status: DC
Start: 2021-08-29 — End: 2021-09-14

## 2021-08-28 MED ORDER — ROSUVASTATIN CALCIUM 20 MG PO TABS: 20.0000 mg | ORAL_TABLET | Freq: Every day | ORAL | 1 refills | Status: DC

## 2021-08-28 MED ORDER — METOPROLOL SUCCINATE ER 50 MG PO TB24: 50.0000 mg | ORAL_TABLET | Freq: Every day | ORAL | 1 refills | Status: DC

## 2021-08-28 NOTE — TOC CM/SW Note (Signed)
Provided Eliquis card to patient. No further TOC needs identified. ? ? ?Marthenia Rolling, MSN, RN,BSN ?Inpatient [facility] Case Manager ?[phone]   ?

## 2021-08-28 NOTE — Plan of Care (Signed)

## 2021-08-28 NOTE — Discharge Summary (Signed)
? ?DISCHARGE SUMMARY ? ?Stephen Roberts ? ?MR#: [id number] ? ?DOB:04-29-1966  ?Date of Admission: 08/24/2021 ?Date of Discharge: 08/28/2021 ? ?Attending Physician:Hamilton Marinello Hennie Duos, MD ? ?Patient's WUJ:WJXBJ, Colin Broach, MD ? ?Consults: [facility] Cardiology  ? ?Disposition: DC home  ? ?Follow-up Appts: ? Follow-up Information   ? ? Loel Dubonnet, NP Follow up.   ?Specialty: Cardiology ?Why: A cardiology follow-up has been scheduled on Tuesday Sep 14, 2021 at 1:30 PM (Arrive by 1:15 PM) with Laurann Montana, one of our nurse practitioners with the cardiology team. This is located at the [facility] facility at [facility]. ?Contact information: ?[address] ?[place] [address] ?[phone] ? ? ?  ?  ? ? Lindell Spar, MD Follow up in 1 week(s).   ?Specialty: Internal Medicine ?Contact information: ?[address] ?[place] [address] ?[phone] ? ? ?  ?  ? ?  ?  ? ?  ? ? ?Tests Needing Follow-up: ?-check CBC w/ patient newly started on abixiban ?-check LFTs in 6-8 weeks, as well as lipids, w/ patient newly started on crestor ?-check CBG control - metformin started this admission  ? ?Discharge Diagnoses: ?Newly diagnosed atrial fibrillation with acute RVR ?Intermittent chest pain ?Iron deficiency anemia due to occult small bowel bleeding (NSAID related) and malabsorption (possibly due to PPI) ?HTN ?DM2 ?Tobacco abuse ? ?Initial presentation: ?56 year old with a history of HTN, DM 2 diet controlled, iron deficiency anemia, Warthin's tumor, and tobacco abuse who presented to the [facility] ED with a 27-monthhistory of intermittent palpitations shortness of breath and chest discomfort with generalized fatigue and DOE.  He first presented to an urgent care, where an EKG noted atrial fibrillation with RVR.  He was therefore transferred to the ED. ? ?Hospital Course: ? [facility] and [facility]VR was able to be controlled.  Despite this the  patient had persisting symptoms of chest pain and dyspnea.  Cardiology was consulted, [facility]. ? ?Newly diagnosed atrial fibrillation with acute RVR ?History suggests this has likely been going on for 1-2 months -CHA2DS2-VASc is 3 - apixaban initiated during this admission - metoprolol being utilized for rate control at this time with patient initially requiring diltiazem drip - TSH normal -TTE noted reduced EF of 40-45% with global hypokinesis and LVH -rate controlled at time of discharge -Cardiology to plan for cardioversion in 3-4 weeks with follow-up in clinic arranged ?  ?Intermittent chest pain ?Cardiac cath 4/14 noted only mild irregularities with normal filling pressures/no significant CAD - possibly due to known hiatal hernia - cont home PPI  ?  ?Iron deficiency anemia due to occult small bowel bleeding (NSAID related) and malabsorption (possibly due to PPI) ?Iron studies showing persistent low iron levels -received iron infusion 2 months ago -ferritin 12 with iron low at 34 and normal TIBC of 404 -continue established outpatient follow-up - counseled pt to monitor stools for melena/brbpr with initiation of DOAC ?  ?HTN ?Was not on any prescription medications at time of admission -presently on metoprolol primarily for rate control -blood pressure well controlled at time of discharge - ARB added at time of d/c given DM  ?  ?DM2 ?Previously diet controlled -A1c in March of last year 6.41 -A1c July 2019 7.2 -has been on metformin in the past but not of late -A1c 8.0 this admission - discussed addition of Glucophage to his treatment  regimen with goal of lowering A1c ?  ?Tobacco abuse ?Has been counseled on absolute need to stop smoking ? ?Allergies as of 08/28/2021   ? ?   Reactions  ? Gabapentin Shortness Of Breath  ? Lyrica [pregabalin] Shortness Of Breath  ? ?  ? ?  ?Medication List  ?  ? ?STOP taking these medications    ? ?amoxicillin-clavulanate 875-125 MG tablet ?Commonly known as: AUGMENTIN ?  ?benzonatate 100 MG capsule ?Commonly known as: TESSALON ?  ?predniSONE 50 MG tablet ?Commonly known as: DELTASONE ?  ?promethazine-dextromethorphan 6.25-15 MG/5ML syrup ?Commonly known as: PROMETHAZINE-DM ?  ? ?  ? ?TAKE these medications   ? ?albuterol 108 (90 Base) MCG/ACT inhaler ?Commonly known as: VENTOLIN HFA ?INHALE 2 PUFFS INTO THE LUNGS EVERY 6 HOURS AS NEEDED FOR WHEEZING OR SHORTNESS OF BREATH ?  ?apixaban 5 MG Tabs tablet ?Commonly known as: ELIQUIS ?Take 1 tablet (5 mg total) by mouth 2 (two) times daily. ?  ?ferrous sulfate 325 (65 FE) MG EC tablet ?Take 1 tablet (325 mg total) by mouth 2 (two) times daily. ?  ?losartan 25 MG tablet ?Commonly known as: Cozaar ?Take 1 tablet (25 mg total) by mouth daily. ?  ?metFORMIN 500 MG tablet ?Commonly known as: Glucophage ?Take 1 tablet (500 mg total) by mouth 2 (two) times daily with a meal. ?  ?metoprolol succinate 50 MG 24 hr tablet ?Commonly known as: TOPROL-XL ?Take 1 tablet (50 mg total) by mouth daily. Take with or immediately following a meal. ?Start taking on: August 29, 2021 ?  ?multivitamin tablet ?Take 1 tablet by mouth daily. ?  ?nicotine 21 mg/24hr patch ?Commonly known as: NICODERM CQ - dosed in mg/24 hours ?Place 1 patch (21 mg total) onto the skin daily. ?Start taking on: August 29, 2021 ?  ?OMEGA-3 PO ?Take 500 mg by mouth daily. ?  ?oxyCODONE-acetaminophen 5-325 MG tablet ?Commonly known as: PERCOCET/ROXICET ?Take by mouth every 8 (eight) hours as needed for severe pain. ?  ?pantoprazole 40 MG tablet ?Commonly known as: Protonix ?Take 1 tablet (40 mg total) by mouth daily. ?  ?rosuvastatin 20 MG tablet ?Commonly known as: CRESTOR ?Take 1 tablet (20 mg total) by mouth daily. ?Start taking on: August 29, 2021 ?  ?Turmeric 500 MG Caps ?Take 500 mg by mouth daily. ?  ?vitamin B-12 1000 MCG tablet ?Commonly known as: CYANOCOBALAMIN ?Take 1,000 mcg by mouth daily. ?   ?VITAMIN C PO ?Take 500 mg by mouth daily. ?  ? ?  ? ? ?Day of Discharge ?BP (!) 132/92 (BP Location: Left Wrist)   Pulse 63   Temp 98 ?F (36.7 ?C) (Oral)   Resp 19   Ht '5\' 9"'$  (1.753 m)   Wt 102.6 kg   SpO2 100%   BMI 33.40 kg/m?  ? ?Physical Exam: ?General: No acute respiratory distress ?Lungs: Clear to auscultation bilaterally without wheezes or crackles ?Cardiovascular: Regular rate and rhythm without murmur gallop or rub normal S1 and S2 ?Abdomen: Nontender, nondistended, soft, bowel sounds positive, no rebound, no ascites, no appreciable mass ?Extremities: No significant cyanosis, clubbing, or edema bilateral lower extremities ? ?Basic Metabolic Panel: ?Recent Labs  ?Lab 08/24/21 ?0945 08/25/21 ?9211 08/27/21 ?0032  ?NA 137  --  134*  ?K 4.2  --  3.8  ?CL 104  --  102  ?CO2 22  --  25  ?GLUCOSE 159*  --  165*  ?BUN 15  --  15  ?CREATININE 0.66  --  0.92  ?  CALCIUM 9.3  --  8.9  ?MG  --  1.8  --   ? ? ?CBC: ?Recent Labs  ?Lab 08/24/21 ?0945 08/27/21 ?0032  ?WBC 7.2 7.4  ?HGB 13.1 12.1*  ?HCT 40.3 37.8*  ?MCV 92.2 92.2  ?PLT 162 151  ? ? ?CBG: ?Recent Labs  ?Lab 08/27/21 ?1604 08/27/21 ?2113 08/28/21 ?4917 08/28/21 ?9150 08/28/21 ?1113  ?GLUCAP 181* 86 139* 124* 130*  ? ?Time spent in discharge (includes decision making & examination of pt): ?35 minutes ? ?08/28/2021, 11:49 AM  ? ?Cherene Altes, MD ?[facility] ?Office  [phone] ? ? ? ? ? ?

## 2021-08-28 NOTE — Progress Notes (Signed)
? ?Progress Note ? ?Patient Name: Stephen Roberts ?Date of Encounter: 08/28/2021 ? ?[facility] Cardiologist: None  ? ?Patient Profile  ?   ?56 y.o. male HTN, DMII, iron deficiency anemia, and tobacco use who presented to the ER with chest pain and palpitations found to be in Afib with RVR for which Cardiology was consulted.  ?4/23 Echo >> EF 45-50% LAE severe/RAE mod ?LHC >> no sign CAD ? ?Subjective  ?No palpitations.  Has a history of "skipped beats " ?Alcohol intake is significant sort of binging I i.e. admitting that when he has 1 beer    he has 6-8 ? ?Significant sleep disordered breathing ?   ?Inpatient Medications  ?  ?Scheduled Meds: ? apixaban  5 mg Oral BID  ? Chlorhexidine Gluconate Cloth  6 each Topical Q0600  ? ferrous sulfate  325 mg Oral BID  ? insulin aspart  0-15 Units Subcutaneous TID WC  ? insulin aspart  0-5 Units Subcutaneous QHS  ? insulin aspart  3 Units Subcutaneous TID WC  ? metoprolol succinate  50 mg Oral Daily  ? nicotine  21 mg Transdermal Daily  ? pantoprazole  40 mg Oral Daily  ? rosuvastatin  20 mg Oral Daily  ? sodium chloride flush  3 mL Intravenous Q12H  ? ?Continuous Infusions: ? sodium chloride    ? ? ?PRN Meds: ?sodium chloride, acetaminophen **OR** [DISCONTINUED] acetaminophen, ondansetron **OR** ondansetron (ZOFRAN) IV, oxyCODONE-acetaminophen, sodium chloride flush  ? ?Vital Signs  ?  ?Vitals:  ? 08/27/21 1942 08/27/21 2259 08/28/21 0308 08/28/21 0804  ?BP: 131/81 (!) 143/94 (!) 148/94 110/75  ?Pulse: 77 77 75 85  ?Resp: '18 12 16 12  '$ ?Temp: 98.3 ?F (36.8 ?C) 98.2 ?F (36.8 ?C) 98 ?F (36.7 ?C) 97.9 ?F (36.6 ?C)  ?TempSrc: Oral Oral Oral Oral  ?SpO2: 94% 95% 96% 95%  ?Weight:   102.6 kg   ?Height:      ? ? ?Intake/Output Summary (Last 24 hours) at 08/28/2021 0826 ?Last data filed at 08/28/2021 0800 ?Gross per 24 hour  ?Intake 1584.1 ml  ?Output --  ?Net 1584.1 ml  ? ? ? ?  08/28/2021  ?  3:08 AM 08/27/2021  ?  6:11 AM 08/25/2021  ?  5:14 AM  ?Last 3 Weights  ?Weight (lbs) 226 lb  3.1 oz 226 lb 3.1 oz 229 lb 8 oz  ?Weight (kg) 102.6 kg 102.6 kg 104.1 kg  ?   ? ?Telemetry  ?  ?AFib heart rate 80?-- Personally Reviewed ?But also high-frequency PVCs at 15 to 20% with a right bundle superior axis morphology, 2 and 3 being negative but curiously after being positive ? ?ECG  ?  ?No new tracing today - Personally Reviewed ? ?Physical Exam  ?Well developed and nourished in no acute distress ?HENT normal ?Neck supple with JVP-  flat   ?Clear ?Irregularly Irregular rate and rhythm with  controlled  ventricular response  no murmurs or gallops ?Abd-soft with active BS ?No Clubbing cyanosis edema ?Skin-warm and dry ?A & Oriented  Grossly normal sensory and motor function ? ?  ? ?Labs  ?  ?High Sensitivity Troponin:   ?Recent Labs  ?Lab 08/24/21 ?0945 08/24/21 ?1105  ?TROPONINIHS 14 13  ? ?   ?Chemistry ?Recent Labs  ?Lab 08/24/21 ?0945 08/25/21 ?3748 08/27/21 ?0032  ?NA 137  --  134*  ?K 4.2  --  3.8  ?CL 104  --  102  ?CO2 22  --  25  ?GLUCOSE 159*  --  165*  ?BUN 15  --  15  ?CREATININE 0.66  --  0.92  ?CALCIUM 9.3  --  8.9  ?MG  --  1.8  --   ?GFRNONAA >60  --  >60  ?ANIONGAP 11  --  7  ? ?  ?Lipids No results for input(s): CHOL, TRIG, HDL, LABVLDL, LDLCALC, CHOLHDL in the last 168 hours.  ?Hematology ?Recent Labs  ?Lab 08/24/21 ?0945 08/27/21 ?0032  ?WBC 7.2 7.4  ?RBC 4.37 4.10*  ?HGB 13.1 12.1*  ?HCT 40.3 37.8*  ?MCV 92.2 92.2  ?MCH 30.0 29.5  ?MCHC 32.5 32.0  ?RDW 15.4 15.3  ?PLT 162 151  ? ? ?Thyroid  ?Recent Labs  ?Lab 08/24/21 ?0945  ?TSH 1.900  ? ?  ?BNPNo results for input(s): BNP, PROBNP in the last 168 hours.  ?DDimer No results for input(s): DDIMER in the last 168 hours.  ? ?Radiology  ?  ?CARDIAC CATHETERIZATION ? ?Result Date: 08/27/2021 ?  LV end diastolic pressure is normal. Mild nonobstructive CAD Normal LVEDP Plan: medical therapy.   ? ?Cardiac Studies  ? ?TTE 08/25/21: ?IMPRESSIONS  ? ? ? 1. Left ventricular ejection fraction, by estimation, is 45 to 50%. The  ?left ventricle has  mildly decreased function. The left ventricle  ?demonstrates global hypokinesis. There is mild left ventricular  ?hypertrophy. Left ventricular diastolic parameters  ?are indeterminate.  ? 2. Right ventricular systolic function is normal. The right ventricular  ?size is normal. There is normal pulmonary artery systolic pressure.  ? 3. Left atrial size was severely dilated.  ? 4. Right atrial size was moderately dilated.  ? 5. The mitral valve is normal in structure. Trivial mitral valve  ?regurgitation. No evidence of mitral stenosis.  ? 6. The aortic valve is tricuspid. Aortic valve regurgitation is not  ?visualized. Aortic valve sclerosis is present, with no evidence of aortic  ?valve stenosis.  ? 7. The inferior vena cava is dilated in size with >50% respiratory  ?variability, suggesting right atrial pressure of 8 mmHg.  ? ? ? ?Assessment & Plan  ?AFib RVR ? ?Cardiomyopathy-- mr EF 45-50 ? ?DM ? ?PVCs ? ?Sleep disordered breathing ? ?Alcohol intake-exuberant ?  ? ?Continue Metoprolol ?Add losartan 25 for cardiomyopathy and renal protection ?Continue Apixaban   ?Needs a outpatient sleep study  ? ?PVCs may also be contributing to his cardiomyopathy.  The question will be his atrial fibrillation, PVCs, both or neither.  This will have to be answered sequentially.  With restoration of sinus rhythm and control of the ventricular rate, and in this regard he will need a wearable technology to follow as he has no symptoms, reassessment of LV function will help distinguish. ? ?If the LV function persist, therapy directed the PVCs would be appropriate.  The intrinsicoid deflection is long suggesting that ablation may not be all that easy.  The morphology of the beats interestingly with QS in aVR and aVL suggesting summit source, but this would be inconsistent with the vector in leads II, 3.  We will order a twelve-lead rhythm strip for further clarification. ? ?Encouraged him to decrease his alcohol intake ? ? ?   ? ?For  questions or updates, please contact [facility] ?Please consult [email or web address] for contact info under  ? ?  ?   ?Signed, ?Virl Axe, MD  ?08/28/2021, 8:26 AM    ?

## 2021-08-28 NOTE — Plan of Care (Signed)
?  Problem: Education: ?Goal: Knowledge of General Education information will improve ?Description: Including pain rating scale, medication(s)/side effects and non-pharmacologic comfort measures ?08/28/2021 1448 by Leonie Man, RN ?Outcome: Adequate for Discharge ?08/28/2021 1354 by Leonie Man, RN ?Outcome: Progressing ?  ?Problem: Health Behavior/Discharge Planning: ?Goal: Ability to manage health-related needs will improve ?08/28/2021 1448 by Leonie Man, RN ?Outcome: Adequate for Discharge ?08/28/2021 1354 by Leonie Man, RN ?Outcome: Progressing ?  ?Problem: Clinical Measurements: ?Goal: Ability to maintain clinical measurements within normal limits will improve ?08/28/2021 1448 by Leonie Man, RN ?Outcome: Adequate for Discharge ?08/28/2021 1354 by Leonie Man, RN ?Outcome: Progressing ?Goal: Will remain free from infection ?08/28/2021 1448 by Leonie Man, RN ?Outcome: Adequate for Discharge ?08/28/2021 1354 by Leonie Man, RN ?Outcome: Progressing ?Goal: Diagnostic test results will improve ?08/28/2021 1448 by Leonie Man, RN ?Outcome: Adequate for Discharge ?08/28/2021 1354 by Leonie Man, RN ?Outcome: Progressing ?Goal: Respiratory complications will improve ?08/28/2021 1448 by Leonie Man, RN ?Outcome: Adequate for Discharge ?08/28/2021 1354 by Leonie Man, RN ?Outcome: Progressing ?Goal: Cardiovascular complication will be avoided ?08/28/2021 1448 by Leonie Man, RN ?Outcome: Adequate for Discharge ?08/28/2021 1354 by Leonie Man, RN ?Outcome: Progressing ?  ?Problem: Activity: ?Goal: Risk for activity intolerance will decrease ?08/28/2021 1448 by Leonie Man, RN ?Outcome: Adequate for Discharge ?08/28/2021 1354 by Leonie Man, RN ?Outcome: Progressing ?  ?Problem: Nutrition: ?Goal: Adequate nutrition will be maintained ?08/28/2021 1448 by Leonie Man, RN ?Outcome: Adequate for Discharge ?08/28/2021 1354 by Leonie Man, RN ?Outcome: Progressing ?  ?Problem: Coping: ?Goal: Level of anxiety  will decrease ?08/28/2021 1448 by Leonie Man, RN ?Outcome: Adequate for Discharge ?08/28/2021 1354 by Leonie Man, RN ?Outcome: Progressing ?  ?Problem: Elimination: ?Goal: Will not experience complications related to bowel motility ?08/28/2021 1448 by Leonie Man, RN ?Outcome: Adequate for Discharge ?08/28/2021 1354 by Leonie Man, RN ?Outcome: Progressing ?Goal: Will not experience complications related to urinary retention ?08/28/2021 1448 by Leonie Man, RN ?Outcome: Adequate for Discharge ?08/28/2021 1354 by Leonie Man, RN ?Outcome: Progressing ?  ?Problem: Pain Managment: ?Goal: General experience of comfort will improve ?08/28/2021 1448 by Leonie Man, RN ?Outcome: Adequate for Discharge ?08/28/2021 1354 by Leonie Man, RN ?Outcome: Progressing ?  ?Problem: Safety: ?Goal: Ability to remain free from injury will improve ?08/28/2021 1448 by Leonie Man, RN ?Outcome: Adequate for Discharge ?08/28/2021 1354 by Leonie Man, RN ?Outcome: Progressing ?  ?Problem: Skin Integrity: ?Goal: Risk for impaired skin integrity will decrease ?08/28/2021 1448 by Leonie Man, RN ?Outcome: Adequate for Discharge ?08/28/2021 1354 by Leonie Man, RN ?Outcome: Progressing ?  ?

## 2021-08-30 ENCOUNTER — Other Ambulatory Visit: Payer: Self-pay | Admitting: *Deleted

## 2021-08-30 ENCOUNTER — Telehealth: Payer: Self-pay

## 2021-08-30 ENCOUNTER — Ambulatory Visit: Payer: Self-pay | Admitting: Internal Medicine

## 2021-08-30 MED ORDER — FERROUS SULFATE 325 (65 FE) MG PO TBEC: 325.0000 mg | DELAYED_RELEASE_TABLET | Freq: Two times a day (BID) | ORAL | 3 refills | Status: DC

## 2021-08-30 NOTE — Telephone Encounter (Signed)
Pt medication sent to pharmacy pt needs to keep upcoming appt 4-19 ?

## 2021-08-30 NOTE — Telephone Encounter (Signed)
Patient seen in ER past weekend, asking if Dr Posey Pronto would send in ferrous sulfate 325 was given to patient from Dr Buelah Manis ER told patient to stay on this medicine need refill ? ?Pharmacy: [facility] ?

## 2021-08-31 ENCOUNTER — Telehealth: Payer: Self-pay | Admitting: *Deleted

## 2021-08-31 NOTE — Telephone Encounter (Signed)
Transition Care Management Follow-up Telephone Call ?Date of discharge and from where: 08-28-21 [facility] ?How have you been since you were released from the hospital? Doing good  ?Any questions or concerns? No ? ?Items Reviewed: ?Did the pt receive and understand the discharge instructions provided? Yes  ?Medications obtained and verified? Yes  ?Other? No  ?Any new allergies since your discharge? Yes  ?Dietary orders reviewed? Yes ?Do you have support at home? Yes  ? ?Home Care and Equipment/Supplies: ?Were home health services ordered? not applicable ?If so, what is the name of the agency? NA  ?Has the agency set up a time to come to the patient's home? not applicable ?Were any new equipment or medical supplies ordered?  No ?What is the name of the medical supply agency? NA ?Were you able to get the supplies/equipment? not applicable ?Do you have any questions related to the use of the equipment or supplies? No ? ?Functional Questionnaire: (I = Independent and D = Dependent) ?ADLs: i ? ?Bathing/Dressing- i ? ?Meal Prep- i ? ?Eating- i ? ?Maintaining continence- i ? ?Transferring/Ambulation- i ? ?Managing Meds- I  ? ?Follow up appointments reviewed: ? ?PCP Hospital f/u appt confirmed? Yes  Scheduled to see Posey Pronto on 09-01-21 @ 2:00. ?[facility] f/u appt confirmed? No ?Are transportation arrangements needed? No  ?If their condition worsens, is the pt aware to call PCP or go to the Emergency Dept.? Yes ?Was the patient provided with contact information for the PCP's office or ED? Yes ?Was to pt encouraged to call back with questions or concerns? Yes ? ?

## 2021-09-01 ENCOUNTER — Encounter: Payer: Self-pay | Admitting: Internal Medicine

## 2021-09-01 ENCOUNTER — Ambulatory Visit (INDEPENDENT_AMBULATORY_CARE_PROVIDER_SITE_OTHER): Payer: PPO | Admitting: Internal Medicine

## 2021-09-01 VITALS — BP 122/74 | HR 57 | Resp 18 | Ht 69.0 in | Wt 229.8 lb

## 2021-09-01 DIAGNOSIS — I4891 Unspecified atrial fibrillation: Secondary | ICD-10-CM

## 2021-09-01 DIAGNOSIS — E119 Type 2 diabetes mellitus without complications: Secondary | ICD-10-CM | POA: Diagnosis not present

## 2021-09-01 DIAGNOSIS — K219 Gastro-esophageal reflux disease without esophagitis: Secondary | ICD-10-CM | POA: Diagnosis not present

## 2021-09-01 DIAGNOSIS — Z09 Encounter for follow-up examination after completed treatment for conditions other than malignant neoplasm: Secondary | ICD-10-CM | POA: Diagnosis not present

## 2021-09-01 DIAGNOSIS — I10 Essential (primary) hypertension: Secondary | ICD-10-CM

## 2021-09-01 DIAGNOSIS — E782 Mixed hyperlipidemia: Secondary | ICD-10-CM | POA: Diagnosis not present

## 2021-09-01 DIAGNOSIS — D500 Iron deficiency anemia secondary to blood loss (chronic): Secondary | ICD-10-CM

## 2021-09-01 DIAGNOSIS — E1169 Type 2 diabetes mellitus with other specified complication: Secondary | ICD-10-CM | POA: Diagnosis not present

## 2021-09-01 MED ORDER — ESOMEPRAZOLE MAGNESIUM 40 MG PO CPDR: 40.0000 mg | DELAYED_RELEASE_CAPSULE | Freq: Every day | ORAL | 3 refills | Status: DC

## 2021-09-01 MED ORDER — METFORMIN HCL 500 MG PO TABS: 500.0000 mg | ORAL_TABLET | Freq: Two times a day (BID) | ORAL | 5 refills | Status: DC

## 2021-09-01 MED ORDER — ROSUVASTATIN CALCIUM 20 MG PO TABS: 20.0000 mg | ORAL_TABLET | Freq: Every day | ORAL | 5 refills | Status: DC

## 2021-09-01 NOTE — Patient Instructions (Addendum)
Please continue taking medications as prescribed. ? ?Please continue to follow low carb diet and ambulate as tolerated. ? ?Please continue taking Nexium for acid reflux. ?

## 2021-09-01 NOTE — Progress Notes (Signed)
? ?Established Patient Office Visit ? ?Subjective:  ?Patient ID: Stephen Roberts, male    DOB: 03-Aug-1965  Age: 56 y.o. MRN: [id number] ? ?CC:  ?Chief Complaint  ?Patient presents with  ? Transitions Of Care  ?  TOC discharged 08-28-21 afib went to urgent care was sent to [facility] cone feels better but does give out easily has appt with cardio in 3 weeks   ? ? ?HPI ?Stephen Roberts is a 56 y.o. male with past medical history of left parotid mass, IDA, type II DM, HLD, chronic neck pain and tobacco abuse who presents for f/u after recent hospitalization. ? ?He was admitted at [facility] from 04/11-04/15 for new onset atrial fibrillation.  He was having fatigue and chest discomfort and went to urgent care, and was later sent to ER due to A-fib with RVR.  He was treated with Cardizem drip, later switched to oral metoprolol and was given Eliquis for [facility].  He has been doing well since being discharged from the hospital.  He is going to see cardiology in the outpatient setting as well. BP is well-controlled. Takes medications regularly. Patient denies headache, dizziness, chest pain, dyspnea or palpitations. ? ?He was also started on metformin for DM and Crestor for HLD. ? ? ?Past Medical History:  ?Diagnosis Date  ? Arthritis   ? Diabetes ([place])   ? GERD (gastroesophageal reflux disease)   ? Heart murmur   ? Pneumonia 06/27/2006  ? ? ?Past Surgical History:  ?Procedure Laterality Date  ? ANTERIOR FUSION CERVICAL SPINE    ? COLONOSCOPY WITH PROPOFOL N/A 03/26/2020  ? Procedure: COLONOSCOPY WITH PROPOFOL;  Surgeon: Daneil Dolin, MD;  [facility];  Service: Endoscopy;  Laterality: N/A;  ? disc in neck Left 05/2017  ? ESOPHAGEAL DILATION N/A 03/25/2020  ? Procedure: ESOPHAGEAL DILATION;  Surgeon: Rogene Houston, MD;  Location: [facility];  Service: Endoscopy;  Laterality: N/A;  ? ESOPHAGOGASTRODUODENOSCOPY (EGD) WITH PROPOFOL N/A 03/25/2020  ? Procedure: ESOPHAGOGASTRODUODENOSCOPY (EGD) WITH PROPOFOL;   Surgeon: Rogene Houston, MD;  [facility]: [facility];  Service: Endoscopy;  Laterality: N/A;  ? GIVENS CAPSULE STUDY N/A 04/13/2020  ? Procedure: GIVENS CAPSULE STUDY;  Surgeon: Daneil Dolin, MD;  [facility];  Service: Endoscopy;  Laterality: N/A;  7:30am  ? INSERTION OF MESH N/A 01/23/2013  ? Procedure: INSERTION OF MESH;  Surgeon: Adin Hector, MD;  Location: [facility];  Service: General;  Laterality: N/A;  ? LEFT HEART CATH AND CORONARY ANGIOGRAPHY N/A 08/27/2021  ? Procedure: LEFT HEART CATH AND CORONARY ANGIOGRAPHY;  Surgeon: Martinique, Peter M, MD;  Location: [facility];  Service: Cardiovascular;  Laterality: N/A;  ? POLYPECTOMY  03/26/2020  ? Procedure: POLYPECTOMY;  Surgeon: Daneil Dolin, MD;  Location[facility];  Service: Endoscopy;;  ? TONSILLECTOMY    ? as child  ? VENTRAL HERNIA REPAIR N/A 01/23/2013  ? Procedure: LAPAROSCOPIC MULTIPLE VENTRAL HERNIAS;  Surgeon: Adin Hector, MD;  Location: [facility];  Service: General;  Laterality: N/A;  ? ? ?Family History  ?Problem Relation Age of Onset  ? Depression Mother   ? Hearing loss Mother   ? Hyperlipidemia Mother   ? Hypertension Mother   ? Stroke Mother   ? Early death Father   ? Diabetes Brother   ? Pancreatic cancer Paternal Uncle   ? Colon cancer Neg Hx   ? ? ?Social History  ? ?Socioeconomic History  ?  Marital status: Married  ?  Spouse name: Not on file  ? Number of children: Not on file  ? Years of education: Not on file  ? Highest education level: Not on file  ?Occupational History  ? Not on file  ?Tobacco Use  ? Smoking status: Every Day  ?  Packs/day: 0.50  ?  Years: 42.00  ?  Pack years: 21.00  ?  Types: Cigarettes  ?  Start date: 72  ? Smokeless tobacco: Never  ?Substance and Sexual Activity  ? Alcohol use: Yes  ?  Comment: moderately  ? Drug use: Not Currently  ?  Types: Cocaine  ?  Comment: states he has tried it all. Last used cocaine a few months ago.   ? Sexual activity: Yes  ?Other Topics Concern  ? Not on  file  ?Social History Narrative  ? Not on file  ? ?Social Determinants of Health  ? ?Financial Resource Strain: Not on file  ?Food Insecurity: Not on file  ?Transportation Needs: Not on file  ?Physical Activity: Not on file  ?Stress: Not on file  ?Social Connections: Not on file  ?Intimate Partner Violence: Not on file  ? ? ?Outpatient Medications Prior to Visit  ?Medication Sig Dispense Refill  ? albuterol (VENTOLIN HFA) 108 (90 Base) MCG/ACT inhaler INHALE 2 PUFFS INTO THE LUNGS EVERY 6 HOURS AS NEEDED FOR WHEEZING OR SHORTNESS OF BREATH 18 g 0  ? apixaban (ELIQUIS) 5 MG TABS tablet Take 1 tablet (5 mg total) by mouth 2 (two) times daily. 60 tablet 1  ? Ascorbic Acid (VITAMIN C PO) Take 500 mg by mouth daily.    ? ferrous sulfate 325 (65 FE) MG EC tablet Take 1 tablet (325 mg total) by mouth 2 (two) times daily. 60 tablet 3  ? losartan (COZAAR) 25 MG tablet Take 1 tablet (25 mg total) by mouth daily. 30 tablet 1  ? metoprolol succinate (TOPROL-XL) 50 MG 24 hr tablet Take 1 tablet (50 mg total) by mouth daily. Take with or immediately following a meal. 30 tablet 1  ? Multiple Vitamin (MULTIVITAMIN) tablet Take 1 tablet by mouth daily.    ? nicotine (NICODERM CQ - DOSED IN MG/24 HOURS) 21 mg/24hr patch Place 1 patch (21 mg total) onto the skin daily. 28 patch 0  ? Omega-3 Fatty Acids (OMEGA-3 PO) Take 500 mg by mouth daily.    ? oxyCODONE-acetaminophen (PERCOCET/ROXICET) 5-325 MG tablet Take by mouth every 8 (eight) hours as needed for severe pain.    ? Turmeric 500 MG CAPS Take 500 mg by mouth daily.    ? vitamin B-12 (CYANOCOBALAMIN) 1000 MCG tablet Take 1,000 mcg by mouth daily.    ? metFORMIN (GLUCOPHAGE) 500 MG tablet Take 1 tablet (500 mg total) by mouth 2 (two) times daily with a meal. 60 tablet 1  ? rosuvastatin (CRESTOR) 20 MG tablet Take 1 tablet (20 mg total) by mouth daily. 30 tablet 1  ? pantoprazole (PROTONIX) 40 MG tablet Take 1 tablet (40 mg total) by mouth daily. 902 tablet 1   ? ?Facility-Administered Medications Prior to Visit  ?Medication Dose Route Frequency Provider Last Rate Last Admin  ? 0.9 %  sodium chloride infusion   Intravenous Once Pennington, Rebekah M, PA-C      ? iron sucrose (VENOFER) 200 mg in sodium chloride 0.9 % 100 mL IVPB  200 mg Intravenous Once Pennington, Rebekah M, PA-C      ? ? ?Allergies  ?Allergen Reactions  ?  Gabapentin Shortness Of Breath  ? Lyrica [Pregabalin] Shortness Of Breath  ? ? ?ROS ?Review of Systems  ?Constitutional:  Positive for fatigue. Negative for chills and fever.  ?HENT:  Negative for congestion, sinus pressure, sinus pain and sore throat.   ?Eyes:  Negative for pain and discharge.  ?Respiratory:  Negative for cough and shortness of breath.   ?Cardiovascular:  Negative for chest pain and palpitations.  ?Gastrointestinal:  Negative for diarrhea, nausea and vomiting.  ?Endocrine: Negative for polydipsia and polyuria.  ?Genitourinary:  Negative for dysuria and hematuria.  ?Musculoskeletal:  Positive for back pain and neck pain. Negative for neck stiffness.  ?Skin:  Negative for rash.  ?Neurological:  Negative for dizziness, weakness, numbness and headaches.  ?Psychiatric/Behavioral:  Negative for agitation and behavioral problems.   ? ?  ?Objective:  ?  ?Physical Exam ?Vitals reviewed.  ?Constitutional:   ?   General: He is not in acute distress. ?   Appearance: He is not diaphoretic.  ?HENT:  ?   Head: Normocephalic and atraumatic.  ?   Comments: Enlarged left parotid gland ?   Nose: No congestion.  ?   Mouth/Throat:  ?   Mouth: Mucous membranes are moist.  ?Eyes:  ?   General: No scleral icterus. ?   Extraocular Movements: Extraocular movements intact.  ?Cardiovascular:  ?   Rate and Rhythm: Normal rate. Rhythm irregular.  ?   Pulses: Normal pulses.  ?   Heart sounds: Normal heart sounds. No murmur heard. ?Pulmonary:  ?   Breath sounds: Normal breath sounds. No wheezing or rales.  ?Abdominal:  ?   Palpations: Abdomen is soft.  ?    Tenderness: There is no abdominal tenderness.  ?Musculoskeletal:  ?   Cervical back: Neck supple. No tenderness.  ?   Right lower leg: No edema.  ?   Left lower leg: No edema.  ?Skin: ?   General: Skin is warm.  ?   Findings: No ras

## 2021-09-01 NOTE — Assessment & Plan Note (Signed)
Rate controlled with metoprolol now ?On Eliquis for [facility] ?Follow-up with cardiology to discuss cardioversion ?

## 2021-09-01 NOTE — Assessment & Plan Note (Signed)
Hospital chart reviewed, including discharge summary ? ?New onset A-fib, on beta-blocker and Eliquis now ?On metformin for DM and Crestor for HLD now ?Medications reconciled and reviewed with the patient and his daughter in detail ?

## 2021-09-01 NOTE — Assessment & Plan Note (Signed)
On Crestor now 

## 2021-09-01 NOTE — Assessment & Plan Note (Signed)
Lab Results  ?Component Value Date  ? HGBA1C 8.0 (H) 08/24/2021  ? ? ?On Metformin ?Advised to follow diabetic diet ?F/u CMP and lipid panel ?On statin and ARB now ?Diabetic eye exam: Advised to follow up with Ophthalmology for diabetic eye exam ?

## 2021-09-01 NOTE — Assessment & Plan Note (Addendum)
Followed by hematology clinic, gets Feraheme infusions ?Denies any active signs of bleeding ?Referred to GI as he has h/o GERD and EGD showed erythema around hiatus in the past ?

## 2021-09-01 NOTE — Assessment & Plan Note (Signed)
BP Readings from Last 1 Encounters:  ?09/01/21 122/74  ? ?Well-controlled with losartan and metoprolol now ?Counseled for compliance with the medications ?Advised DASH diet and moderate exercise/walking, at least 150 mins/week ? ?

## 2021-09-01 NOTE — Assessment & Plan Note (Signed)
Takes Nexium, refilled ?

## 2021-09-02 ENCOUNTER — Ambulatory Visit: Payer: PPO | Admitting: Internal Medicine

## 2021-09-03 LAB — MICROALBUMIN / CREATININE URINE RATIO
Creatinine, Urine: 266.2 mg/dL
Microalb/Creat Ratio: 16 mg/g creat (ref 0–29)
Microalbumin, Urine: 42.2 ug/mL

## 2021-09-06 ENCOUNTER — Encounter: Payer: Self-pay | Admitting: Internal Medicine

## 2021-09-07 LAB — HM DIABETES EYE EXAM

## 2021-09-09 ENCOUNTER — Encounter: Payer: Self-pay | Admitting: *Deleted

## 2021-09-14 ENCOUNTER — Ambulatory Visit (INDEPENDENT_AMBULATORY_CARE_PROVIDER_SITE_OTHER): Payer: PPO | Admitting: Family

## 2021-09-14 ENCOUNTER — Encounter (HOSPITAL_BASED_OUTPATIENT_CLINIC_OR_DEPARTMENT_OTHER): Payer: Self-pay | Admitting: Family

## 2021-09-14 VITALS — BP 112/82 | HR 77 | Ht 69.0 in | Wt 237.6 lb

## 2021-09-14 DIAGNOSIS — R0683 Snoring: Secondary | ICD-10-CM | POA: Diagnosis not present

## 2021-09-14 DIAGNOSIS — Z720 Tobacco use: Secondary | ICD-10-CM

## 2021-09-14 DIAGNOSIS — G4730 Sleep apnea, unspecified: Secondary | ICD-10-CM

## 2021-09-14 DIAGNOSIS — I480 Paroxysmal atrial fibrillation: Secondary | ICD-10-CM

## 2021-09-14 DIAGNOSIS — D6859 Other primary thrombophilia: Secondary | ICD-10-CM

## 2021-09-14 DIAGNOSIS — I5020 Unspecified systolic (congestive) heart failure: Secondary | ICD-10-CM

## 2021-09-14 DIAGNOSIS — Z01812 Encounter for preprocedural laboratory examination: Secondary | ICD-10-CM | POA: Diagnosis not present

## 2021-09-14 DIAGNOSIS — R5383 Other fatigue: Secondary | ICD-10-CM

## 2021-09-14 MED ORDER — METOPROLOL SUCCINATE ER 50 MG PO TB24: 50.0000 mg | ORAL_TABLET | Freq: Every day | ORAL | 1 refills | Status: DC

## 2021-09-14 MED ORDER — LOSARTAN POTASSIUM 25 MG PO TABS: 25.0000 mg | ORAL_TABLET | Freq: Every day | ORAL | 1 refills | Status: DC

## 2021-09-14 MED ORDER — APIXABAN 5 MG PO TABS: 5.0000 mg | ORAL_TABLET | Freq: Two times a day (BID) | ORAL | 5 refills | Status: DC

## 2021-09-14 MED ORDER — BUPROPION HCL ER (SR) 150 MG PO TB12: ORAL_TABLET | ORAL | 0 refills | Status: DC

## 2021-09-14 NOTE — Progress Notes (Signed)
? ?Office Visit  ?  ?Patient Name: Stephen Roberts ?Date of Encounter: 09/14/2021 ? ?PCP:  Lindell Spar, MD ?  ?[place]  ?Cardiologist:  Werner Lean, MD  ?Advanced Practice Provider:  No care team member to display ?Electrophysiologist:  None  ? ?Chief Complaint  ?  ?Stephen Roberts is a 56 y.o. male with a hx of HFmrEF, atrial fibrillation, IDA, Warthin's tumor, HTN, DM2, tobacco use, PVC, hiatal hernia presents today for hospital follow up.  ? ?Past Medical History  ?  ?Past Medical History:  ?Diagnosis Date  ? Arthritis   ? Diabetes ([place])   ? GERD (gastroesophageal reflux disease)   ? Heart murmur   ? Pneumonia 06/27/2006  ? ?Past Surgical History:  ?Procedure Laterality Date  ? ANTERIOR FUSION CERVICAL SPINE    ? COLONOSCOPY WITH PROPOFOL N/A 03/26/2020  ? Procedure: COLONOSCOPY WITH PROPOFOL;  Surgeon: Daneil Dolin, MD;  [facility];  Service: Endoscopy;  Laterality: N/A;  ? disc in neck Left 05/2017  ? ESOPHAGEAL DILATION N/A 03/25/2020  ? Procedure: ESOPHAGEAL DILATION;  Surgeon: Rogene Houston, MD;  [facility];  Service: Endoscopy;  Laterality: N/A;  ? ESOPHAGOGASTRODUODENOSCOPY (EGD) WITH PROPOFOL N/A 03/25/2020  ? Procedure: ESOPHAGOGASTRODUODENOSCOPY (EGD) WITH PROPOFOL;  Surgeon: Rogene Houston, MD;  [facility];  Service: Endoscopy;  Laterality: N/A;  ? GIVENS CAPSULE STUDY N/A 04/13/2020  ? Procedure: GIVENS CAPSULE STUDY;  Surgeon: Daneil Dolin, MD;  [facility];  Service: Endoscopy;  Laterality: N/A;  7:30am  ? INSERTION OF MESH N/A 01/23/2013  ? Procedure: INSERTION OF MESH;  Surgeon: Adin Hector, MD;  [facility]: [facility];  Service: General;  Laterality: N/A;  ? LEFT HEART CATH AND CORONARY ANGIOGRAPHY N/A 08/27/2021  ? Procedure: LEFT HEART CATH AND CORONARY ANGIOGRAPHY;  Surgeon: Martinique, Peter M, MD;  Location: [facility];  Service: Cardiovascular;  Laterality: N/A;  ? POLYPECTOMY   03/26/2020  ? Procedure: POLYPECTOMY;  Surgeon: Daneil Dolin, MD;  [facility];  Service: Endoscopy;;  ? TONSILLECTOMY    ? as child  ? VENTRAL HERNIA REPAIR N/A 01/23/2013  ? Procedure: LAPAROSCOPIC MULTIPLE VENTRAL HERNIAS;  Surgeon: Adin Hector, MD;  [facility]: [facility];  Service: General;  Laterality: N/A;  ? ? ?Allergies ? ?Allergies  ?Allergen Reactions  ? Gabapentin Shortness Of Breath  ? Lyrica [Pregabalin] Shortness Of Breath  ? ? ?History of Present Illness  ?  ?Stephen Roberts is a 56 y.o. male with a hx of HFmrEF, atrial fibrillation, IDA, Warthin's tumor, HTN, DM2, tobacco use, PVC, hiatal hernia  last seen while hospitalized. ? ?Presented to urgent care 08/24/21 where EKG revealed atrial fibrillation. He was transferred to the emergency department. He noted ongoing symptoms for 2 months. Apixaban and Metoprolol initiated. Echo 08/25/21 mildly reduced LVEF 45-50%, mid LVH, LV global hypokinesis, RV normal size and function, LA severely dilated, RA moderately dilated, trivial MR, aortic sclerosis without stenosis. Cardiology was consulted due to chest pain with cardiac cath 08/27/21 with only mild luminal irregularities and normal filling pressures. His A1c was 8, previously diet controlled, and Metformin initiated. Tobacco and etoh cessation were encouraged.  ? ?Presents today for follow up with his wife. He is cutting back on smoking but having difficulty with irritability, cravings. Did not tolerate nicotine patch or gum. He notes he has been having muscle, joint pain all over and wonders if  Crestor is contributory. Notes he felt good on discharge but for the past 5 days has felt more fatigued, wiped out. Notes occasional palpitations. . Will get a tightness or a burning in his chest which he describes as feels like "windpipe burning". Happens when he is up moving. Notes he can fall asleep but not stay asleep. He snores and his wife has witnessed him stop breathing in his sleep. We  reviewed heart failure with midly reduced EF as well as atrial fibrillation pathophysiology and treatment. He did miss a dose of Eliquis on April 30th but no missed doses since that time.  ? ?EKGs/Labs/Other Studies Reviewed:  ? ?The following studies were reviewed today: ?LHC 08/27/21 ?   ?  LV end diastolic pressure is normal. ?  ?Mild nonobstructive CAD ?Normal LVEDP ?  ?Plan: medical therapy. ? ?Dominance: Right ?Left Main  ?Vessel was injected. Vessel is normal in caliber. Vessel is angiographically normal.  ?  ?Left Anterior Descending  ?Vessel was injected. Vessel is normal in caliber. There is mild diffuse disease throughout the vessel.  ?  ?Left Circumflex  ?Vessel was injected. Vessel is normal in caliber. The vessel exhibits minimal luminal irregularities.  ?  ?Right Coronary Artery  ?Vessel was injected. Vessel is normal in caliber. There is mild diffuse disease throughout the vessel.  ?  ? ?Dominance: Right ? ? ?TTE 08/25/21: ? 1. Left ventricular ejection fraction, by estimation, is 45 to 50%. The  ?left ventricle has mildly decreased function. The left ventricle  ?demonstrates global hypokinesis. There is mild left ventricular  ?hypertrophy. Left ventricular diastolic parameters  ?are indeterminate.  ? 2. Right ventricular systolic function is normal. The right ventricular  ?size is normal. There is normal pulmonary artery systolic pressure.  ? 3. Left atrial size was severely dilated.  ? 4. Right atrial size was moderately dilated.  ? 5. The mitral valve is normal in structure. Trivial mitral valve  ?regurgitation. No evidence of mitral stenosis.  ? 6. The aortic valve is tricuspid. Aortic valve regurgitation is not  ?visualized. Aortic valve sclerosis is present, with no evidence of aortic  ?valve stenosis.  ? 7. The inferior vena cava is dilated in size with >50% respiratory  ?variability, suggesting right atrial pressure of 8 mmHg.  ?  ? ?EKG:  EKG is ordered today.  The ekg ordered today  demonstrates atrial fibrillation 77bpm with frequent PVC, LAFB ? ?Recent Labs: ?06/01/2021: ALT 21 ?08/24/2021: TSH 1.900 ?08/25/2021: Magnesium 1.8 ?08/27/2021: BUN 15; Creatinine, Ser 0.92; Hemoglobin 12.1; Platelets 151; Potassium 3.8; Sodium 134  ?Recent Lipid Panel ?   ?Component Value Date/Time  ? CHOL 159 02/13/2019 1258  ? TRIG 140 02/13/2019 1258  ? HDL 38 (L) 02/13/2019 1258  ? CHOLHDL 4.2 02/13/2019 1258  ? VLDL 11 08/31/2016 0935  ? [place] 97 02/13/2019 1258  ? ? ?Risk Assessment/Calculations:  ? ?CHA2DS2-VASc Score = 3  ? This indicates a 3.2% annual risk of stroke. ?The patient's score is based upon: ?CHF History: 1 ?HTN History: 1 ?Diabetes History: 1 ?Stroke History: 0 ?Vascular Disease History: 0 ?Age Score: 0 ?Gender Score: 0 ?  ? ? ?Home Medications  ? ?Current Meds  ?Medication Sig  ? albuterol (VENTOLIN HFA) 108 (90 Base) MCG/ACT inhaler INHALE 2 PUFFS INTO THE LUNGS EVERY 6 HOURS AS NEEDED FOR WHEEZING OR SHORTNESS OF BREATH  ? Ascorbic Acid (VITAMIN C PO) Take 500 mg by mouth daily.  ? buPROPion (WELLBUTRIN SR) 150 MG 12 hr tablet Take  1 tablet (150 mg total) by mouth daily for 3 days, THEN 1 tablet (150 mg total) 2 (two) times daily.  ? esomeprazole (NEXIUM) 40 MG capsule Take 1 capsule (40 mg total) by mouth daily.  ? ferrous sulfate 325 (65 FE) MG EC tablet Take 1 tablet (325 mg total) by mouth 2 (two) times daily.  ? metFORMIN (GLUCOPHAGE) 500 MG tablet Take 1 tablet (500 mg total) by mouth 2 (two) times daily with a meal.  ? Multiple Vitamin (MULTIVITAMIN) tablet Take 1 tablet by mouth daily.  ? Omega-3 Fatty Acids (OMEGA-3 PO) Take 500 mg by mouth daily.  ? oxyCODONE-acetaminophen (PERCOCET/ROXICET) 5-325 MG tablet Take by mouth every 8 (eight) hours as needed for severe pain.  ? Turmeric 500 MG CAPS Take 500 mg by mouth daily.  ? vitamin B-12 (CYANOCOBALAMIN) 1000 MCG tablet Take 1,000 mcg by mouth daily.  ? [DISCONTINUED] apixaban (ELIQUIS) 5 MG TABS tablet Take 1 tablet (5 mg total) by  mouth 2 (two) times daily.  ? [DISCONTINUED] losartan (COZAAR) 25 MG tablet Take 1 tablet (25 mg total) by mouth daily.  ? [DISCONTINUED] metoprolol succinate (TOPROL-XL) 50 MG 24 hr tablet Take 1 tablet (50 mg total) by m

## 2021-09-14 NOTE — H&P (View-Only) (Signed)
Office Visit    Patient Name: Stephen Roberts Date of Encounter: 09/14/2021  PCP:  Lindell Spar, MD   [place]  Cardiologist:  Werner Lean, MD  Advanced Practice Provider:  No care team member to display Electrophysiologist:  None   Chief Complaint    Stephen Roberts is a 56 y.o. male with a hx of HFmrEF, atrial fibrillation, IDA, Warthin's tumor, HTN, DM2, tobacco use, PVC, hiatal hernia presents today for hospital follow up.   Past Medical History    Past Medical History:  Diagnosis Date   Arthritis    Diabetes ([place])    GERD (gastroesophageal reflux disease)    Heart murmur    Pneumonia 06/27/2006   Past Surgical History:  Procedure Laterality Date   ANTERIOR FUSION CERVICAL SPINE     COLONOSCOPY WITH PROPOFOL N/A 03/26/2020   Procedure: COLONOSCOPY WITH PROPOFOL;  Surgeon: Daneil Dolin, MD;  [facility];  Service: Endoscopy;  Laterality: N/A;   disc in neck Left 05/2017   ESOPHAGEAL DILATION N/A 03/25/2020   Procedure: ESOPHAGEAL DILATION;  Surgeon: Rogene Houston, MD;  [facility];  Service: Endoscopy;  Laterality: N/A;   ESOPHAGOGASTRODUODENOSCOPY (EGD) WITH PROPOFOL N/A 03/25/2020   Procedure: ESOPHAGOGASTRODUODENOSCOPY (EGD) WITH PROPOFOL;  Surgeon: Rogene Houston, MD;  [facility];  Service: Endoscopy;  Laterality: N/A;   GIVENS CAPSULE STUDY N/A 04/13/2020   Procedure: GIVENS CAPSULE STUDY;  Surgeon: Daneil Dolin, MD;  [facility];  Service: Endoscopy;  Laterality: N/A;  7:30am   INSERTION OF MESH N/A 01/23/2013   Procedure: INSERTION OF MESH;  Surgeon: Adin Hector, MD;  [facility];  Service: General;  Laterality: N/A;   LEFT HEART CATH AND CORONARY ANGIOGRAPHY N/A 08/27/2021   Procedure: LEFT HEART CATH AND CORONARY ANGIOGRAPHY;  Surgeon: Martinique, Peter M, MD;  Location: [facility];  Service: Cardiovascular;  Laterality: N/A;   POLYPECTOMY   03/26/2020   Procedure: POLYPECTOMY;  Surgeon: Daneil Dolin, MD;  [facility];  Service: Endoscopy;;   TONSILLECTOMY     as child   VENTRAL HERNIA REPAIR N/A 01/23/2013   Procedure: LAPAROSCOPIC MULTIPLE VENTRAL HERNIAS;  Surgeon: Adin Hector, MD;  [facility];  Service: General;  Laterality: N/A;    Allergies  Allergies  Allergen Reactions   Gabapentin Shortness Of Breath   Lyrica [Pregabalin] Shortness Of Breath    History of Present Illness    Stephen Roberts is a 56 y.o. male with a hx of HFmrEF, atrial fibrillation, IDA, Warthin's tumor, HTN, DM2, tobacco use, PVC, hiatal hernia  last seen while hospitalized.  Presented to urgent care 08/24/21 where EKG revealed atrial fibrillation. He was transferred to the emergency department. He noted ongoing symptoms for 2 months. Apixaban and Metoprolol initiated. Echo 08/25/21 mildly reduced LVEF 45-50%, mid LVH, LV global hypokinesis, RV normal size and function, LA severely dilated, RA moderately dilated, trivial MR, aortic sclerosis without stenosis. Cardiology was consulted due to chest pain with cardiac cath 08/27/21 with only mild luminal irregularities and normal filling pressures. His A1c was 8, previously diet controlled, and Metformin initiated. Tobacco and etoh cessation were encouraged.   Presents today for follow up with his wife. He is cutting back on smoking but having difficulty with irritability, cravings. Did not tolerate nicotine patch or gum. He notes he has been having muscle, joint pain all over and wonders if  Crestor is contributory. Notes he felt good on discharge but for the past 5 days has felt more fatigued, wiped out. Notes occasional palpitations. . Will get a tightness or a burning in his chest which he describes as feels like "windpipe burning". Happens when he is up moving. Notes he can fall asleep but not stay asleep. He snores and his wife has witnessed him stop breathing in his sleep. We  reviewed heart failure with midly reduced EF as well as atrial fibrillation pathophysiology and treatment. He did miss a dose of Eliquis on April 30th but no missed doses since that time.   EKGs/Labs/Other Studies Reviewed:   The following studies were reviewed today: [facility] 20-Sep-2021      LV end diastolic pressure is normal.   Mild nonobstructive CAD Normal LVEDP   Plan: medical therapy.  Dominance: Right Left Main  Vessel was injected. Vessel is normal in caliber. Vessel is angiographically normal.    Left Anterior Descending  Vessel was injected. Vessel is normal in caliber. There is mild diffuse disease throughout the vessel.    Left Circumflex  Vessel was injected. Vessel is normal in caliber. The vessel exhibits minimal luminal irregularities.    Right Coronary Artery  Vessel was injected. Vessel is normal in caliber. There is mild diffuse disease throughout the vessel.     Dominance: Right   TTE 08/25/21:  1. Left ventricular ejection fraction, by estimation, is 45 to 50%. The  left ventricle has mildly decreased function. The left ventricle  demonstrates global hypokinesis. There is mild left ventricular  hypertrophy. Left ventricular diastolic parameters  are indeterminate.   2. Right ventricular systolic function is normal. The right ventricular  size is normal. There is normal pulmonary artery systolic pressure.   3. Left atrial size was severely dilated.   4. Right atrial size was moderately dilated.   5. The mitral valve is normal in structure. Trivial mitral valve  regurgitation. No evidence of mitral stenosis.   6. The aortic valve is tricuspid. Aortic valve regurgitation is not  visualized. Aortic valve sclerosis is present, with no evidence of aortic  valve stenosis.   7. The inferior vena cava is dilated in size with >50% respiratory  variability, suggesting right atrial pressure of 8 mmHg.     EKG:  EKG is ordered today.  The ekg ordered today  demonstrates atrial fibrillation 77bpm with frequent PVC, LAFB  Recent Labs: 06/01/2021: ALT 21 08/24/2021: TSH 1.900 08/25/2021: Magnesium 1.8 09/20/2021: BUN 15; Creatinine, Ser 0.92; Hemoglobin 12.1; Platelets 151; Potassium 3.8; Sodium 134  Recent Lipid Panel    Component Value Date/Time   CHOL 159 02/13/2019 1258   TRIG 140 02/13/2019 1258   HDL 38 (L) 02/13/2019 1258   CHOLHDL 4.2 02/13/2019 1258   VLDL 11 08/31/2016 0935   LDLCALC 97 02/13/2019 1258    Risk Assessment/Calculations:   CHA2DS2-VASc Score = 3   This indicates a 3.2% annual risk of stroke. The patient's score is based upon: CHF History: 1 HTN History: 1 Diabetes History: 1 Stroke History: 0 Vascular Disease History: 0 Age Score: 0 Gender Score: 0     Home Medications   Current Meds  Medication Sig   albuterol (VENTOLIN HFA) 108 (90 Base) MCG/ACT inhaler INHALE 2 PUFFS INTO THE LUNGS EVERY 6 HOURS AS NEEDED FOR WHEEZING OR SHORTNESS OF BREATH   Ascorbic Acid (VITAMIN C PO) Take 500 mg by mouth daily.   buPROPion (WELLBUTRIN SR) 150 MG 12 hr tablet Take  1 tablet (150 mg total) by mouth daily for 3 days, THEN 1 tablet (150 mg total) 2 (two) times daily.   esomeprazole (NEXIUM) 40 MG capsule Take 1 capsule (40 mg total) by mouth daily.   ferrous sulfate 325 (65 FE) MG EC tablet Take 1 tablet (325 mg total) by mouth 2 (two) times daily.   metFORMIN (GLUCOPHAGE) 500 MG tablet Take 1 tablet (500 mg total) by mouth 2 (two) times daily with a meal.   Multiple Vitamin (MULTIVITAMIN) tablet Take 1 tablet by mouth daily.   Omega-3 Fatty Acids (OMEGA-3 PO) Take 500 mg by mouth daily.   oxyCODONE-acetaminophen (PERCOCET/ROXICET) 5-325 MG tablet Take by mouth every 8 (eight) hours as needed for severe pain.   Turmeric 500 MG CAPS Take 500 mg by mouth daily.   vitamin B-12 (CYANOCOBALAMIN) 1000 MCG tablet Take 1,000 mcg by mouth daily.   [DISCONTINUED] apixaban (ELIQUIS) 5 MG TABS tablet Take 1 tablet (5 mg total) by  mouth 2 (two) times daily.   [DISCONTINUED] losartan (COZAAR) 25 MG tablet Take 1 tablet (25 mg total) by mouth daily.   [DISCONTINUED] metoprolol succinate (TOPROL-XL) 50 MG 24 hr tablet Take 1 tablet (50 mg total) by mouth daily. Take with or immediately following a meal.   [DISCONTINUED] rosuvastatin (CRESTOR) 20 MG tablet Take 1 tablet (20 mg total) by mouth daily.     Review of Systems      All other systems reviewed and are otherwise negative except as noted above.  Physical Exam    VS:  BP 112/82 (BP Location: Right Arm, Patient Position: Sitting, Cuff Size: Large)   Pulse 77   Ht '5\' 9"'$  (1.753 m)   Wt 237 lb 9.6 oz (107.8 kg)   SpO2 96%   BMI 35.09 kg/m  , BMI Body mass index is 35.09 kg/m.  Wt Readings from Last 3 Encounters:  09/14/21 237 lb 9.6 oz (107.8 kg)  09/01/21 229 lb 12.8 oz (104.2 kg)  08/28/21 226 lb 3.1 oz (102.6 kg)     GEN: Well nourished, well developed, in no acute distress. HEENT: normal. Neck: Supple, no JVD, carotid bruits, or masses. Cardiac: IRIR, no murmurs, rubs, or gallops. No clubbing, cyanosis, edema.  Radials/PT 2+ and equal bilaterally.  Respiratory:  Respirations regular and unlabored, clear to auscultation bilaterally. GI: Soft, nontender, nondistended. MS: No deformity or atrophy. Skin: Warm and dry, no rash. Neuro:  Strength and sensation are intact. Psych: Normal affect.  Assessment & Plan    Fatigue - Consider etiology atrial fib, HFmrEF, recovery from hospitalization. CBC, BNP, CMP today to rule out new infection, volume overload, anemia, electrolyte abnormality as contributory.   Atrial fibrillation / PVC / Hypercoagulable state - EKG today rate controlled atrial fib with PVC. Continue Toprol '50mg'$  QD.  Missed dose of Eliquis 09/12/21 - has been taking since 09/13/21 without interruption. Plan for DCCV after 3 weeks of uninterrupted anticoagulation. Continue Eliquis '5mg'$  BID, does not meet dose reduction criteria. CHA2DS2-VASc Score =  3 [CHF History: 1, HTN History: 1, Diabetes History: 1, Stroke History: 0, Vascular Disease History: 0, Age Score: 0, Gender Score: 0].  Therefore, the patient's annual risk of stroke is 3.2 %.      Shared Decision Making/Informed Consent The risks (stroke, cardiac arrhythmias rarely resulting in the need for a temporary or permanent pacemaker, skin irritation or burns and complications associated with conscious sedation including aspiration, arrhythmia, respiratory failure and death), benefits (restoration of normal sinus rhythm) and alternatives of  a direct current cardioversion were explained in detail to Mr. Shrewsberry and he agrees to proceed.   HFmrEF - Echo 08/2021 LVEF 45-50%, no coronary disease. Consider etiology atrial fib and/or PVC. Euvolemic and well compensated on exam. Continue Losartan, Metoprolol. No indication for loop diuretic. HF teaching provided today. Plan to update echo to reassess when in NSR.  Snores/sleep disordered breathing - STOPBang Score 7. Witnessed snoring and apneic episode. [place] sleep study provided in clinic.   HTN - BP well controlled. Continue current antihypertensive regimen Losartan '25mg'$  QD, Toprol '50mg'$  QD, refills provided.   Tobacco use - Since hospital discharge has smoked 3 cigarettes and congratulated. Notes irritability and difficulty staying motivated to quit. Did not tolerate nicotine patch or gum. Will Rx Wellbutrin for 3 months to assist in cessation. Encouraged to use 1-800-QUITNOW. Offered referral to psychology for stress coping mechanisms which he politely declined.   ETOH use - Complete cessation encouraged. Notes drinking 3 beers once since discharge. Reiterated importance of cessation.   HLD - Crestor initiated during recent admission. Notes myalgias. 2 week statin holiday. If myalgias resolve, trial Pravastatin. If do not resolve, further workup with PCP.   GERD / Hiatal hernia - On Nexium '40mg'$  QD. Has been referred to GI by PCP.   IDA -  Follows with hematology. On Feraheme.   DM2 - 08/24/21 A1c 8.0. Metformin initiated during admission. Consider SGLT2i for cardioprotective benefit or GLP1 for weight loss/cardioprotective benefit.  Disposition: Follow up in 4-6 week(s) with Werner Lean, MD or [facility].  Signed, Loel Dubonnet, NP 09/14/2021, 7:58 PM [facility]

## 2021-09-14 NOTE — Patient Instructions (Addendum)
Medication Instructions:  ? ?Your physician has recommended you make the following change in your medication:   ? ?START Wellbutrin ?Take '150mg'$  daily for 3 days ?Stop smoking ok day 3 ?Then take '150mg'$  twice daily for 12 weeks ? ?STOP Rosuvastatin (Crestor)  ? ?Please call us if you miss a dose of Eliquis as we will need to reschedule your cardioversion.  ? ? ?*If you need a refill on your cardiac medications before your next appointment, please call your pharmacy* ? ? ?Lab Work: ? ?Your physician recommends that you return for lab work today: BNP, CMP ? ?Your physician recommends that you return for lab work on 5/16 prior to cardioversion for BMP, CBC ? ?You may come to the...  ? ?[facility] (3rd floor) ?[address], [place], [place]  ?Open: 8am-Noon and 1pm-4:30pm  ? ?[facility] ?[place]  ? ?[employer]- Any location ? ?**no appointments needed** ? ? ? ?Testing/Procedures: ? ?WatchPAT??  Is a FDA cleared portable home sleep study test that uses a watch and 3 points of contact to monitor 7 different channels, including your heart rate, oxygen saturations, body position, snoring, and chest motion.  The study is easy to use from the comfort of your own home and accurately detect sleep apnea.  Before bed, you attach the chest sensor, attached the sleep apnea bracelet to your nondominant hand, and attach the finger probe.  After the study, the raw data is downloaded from the watch and scored for apnea events.   ?For more information: [email or web address] ? ? ?You are scheduled for a Cardioversion on 10/05/21  with Dr. Sallyanne Kuster.  Please arrive at the [facility] (Main Entrance A) at [facility]: [address] [place], [place] [address] at 8 am. (1 hour prior to procedure unless lab work is needed; if lab work is needed arrive 1.5 hours ahead) ? ?DIET: Nothing to eat or drink after midnight except a sip of water with  medications (see medication instructions below) ? ?FYI: For your safety, and to allow [place] to monitor your vital signs accurately during the surgery/procedure we request that   ?if you have artificial nails, gel coating, SNS etc. Please have those removed prior to your surgery/procedure. Not having the nail coverings /polish removed may result in cancellation or delay of your surgery/procedure. ? ? ?Medication Instructions: ?Continue your anticoagulant: Eliquis  ?You will need to continue your anticoagulant after your procedure until you  are told by your  Provider that it is safe to stop ? ? ?Labs: Please return for Lab work on 5/16 for BMET and CBC. You may come to the...  ? ?[facility] (3rd floor) ?[address] [place], [place]  ?Open: 8am-Noon and 1pm-4:30pm  ? ?[facility] at [facility] ?[place]  ? ?[employer]- Any location ? ?**no appointments needed** ? ? ?You must have a responsible person to drive you home and stay in the waiting area during your procedure. Failure to do so could result in cancellation. ? ?Interior and spatial designer cards. ? ?*Special Note: Every effort is made to have your procedure done on time. Occasionally there are emergencies that occur at the hospital that may cause delays. Please be patient if a delay does occur.  ? ? ? ? ?Follow-Up: ?At [facility], you and your health needs are our priority.  As part of our continuing mission to provide you with exceptional heart care, we have  created designated Provider Care Teams.  These Care Teams include your primary Cardiologist (physician) and Advanced Practice Providers (APPs -  Physician Assistants and Nurse Practitioners) who all work together to provide you with the care you need, when you need it. ? ?We recommend signing up for the patient portal called "MyChart".  Sign up information is provided on this After Visit Summary.  MyChart is used to connect with patients for Virtual  Visits (Telemedicine).  Patients are able to view lab/test results, encounter notes, upcoming appointments, etc.  Non-urgent messages can be sent to your provider as well.   ?To learn more about what you can do with MyChart, go to [email or web address].   ? ?Your next appointment:   ?4-6 week(s) ? ?The format for your next appointment:   ?In Person ? ?Provider:   ?Werner Lean, MD or Loel Dubonnet, NP  ? ? ?Other Instructions ? ?Heart Healthy Diet Recommendations: ?A low-salt diet is recommended. Meats should be grilled, baked, or boiled. Avoid fried foods. Focus on lean protein sources like fish or chicken with vegetables and fruits. The American Heart Association is a Microbiologist!  American Heart Association Diet and Lifeystyle Recommendations  ?Try to drink less than 2 liters of fluid (64 oz) per day.  ? ?Managing the Challenge of Quitting Smoking ?Quitting smoking is a physical and mental challenge. You may have cravings, withdrawal symptoms, and temptation to smoke. Before quitting, work with your health care provider to make a plan that can help you manage quitting. Making a plan before you quit may keep you from smoking when you have the urge to smoke while trying to quit. ?How to manage lifestyle changes ?Managing stress ?Stress can make you want to smoke, and wanting to smoke may cause stress. It is important to find ways to manage your stress. You could try some of the following: ?Practice relaxation techniques. ?Breathe slowly and deeply, in through your nose and out through your mouth. ?Listen to music. ?Soak in a bath or take a shower. ?Imagine a peaceful place or vacation. ?Get some support. ?Talk with family or friends about your stress. ?Join a support group. ?Talk with a counselor or therapist. ?Get some physical activity. ?Go for a walk, run, or bike ride. ?Play a favorite sport. ?Practice yoga. ? ?Medicines ?Talk with your health care provider about medicines that might help you  deal with cravings and make quitting easier for you. ?Relationships ?Social situations can be difficult when you are quitting smoking. To manage this, you can: ?Avoid parties and other social situations where people might be smoking. ?Avoid alcohol. ?Leave right away if you have the urge to smoke. ?Explain to your family and friends that you are quitting smoking. Ask for support and let them know you might be a bit grumpy. ?Plan activities where smoking is not an option. ?General instructions ?Be aware that many people gain weight after they quit smoking. However, not everyone does. To keep from gaining weight, have a plan in place before you quit, and stick to the plan after you quit. Your plan should include: ?Eating healthy snacks. When you have a craving, it may help to: ?Eat popcorn, or try carrots, celery, or other cut vegetables. ?Chew sugar-free gum. ?Changing how you eat. ?Eat small portion sizes at meals. ?Eat 4-6 small meals throughout the day instead of 1-2 large meals a day. ?Be mindful when you eat. You should avoid watching television or doing other things that might distract you as you  eat. ?Exercising regularly. ?Make time to exercise each day. If you do not have time for a long workout, do short bouts of exercise for 5-10 minutes several times a day. ?Do some form of strengthening exercise, such as weight lifting. ?Do some exercise that gets your heart beating and causes you to breathe deeply, such as walking fast, running, swimming, or biking. This is very important. ?Drinking plenty of water or other low-calorie or no-calorie drinks. Drink enough fluid to keep your urine pale yellow. ? ?How to recognize withdrawal symptoms ?Your body and mind may experience discomfort as you try to get used to not having nicotine in your system. These effects are called withdrawal symptoms. They may include: ?Feeling hungrier than normal. ?Having trouble concentrating. ?Feeling irritable or restless. ?Having  trouble sleeping. ?Feeling depressed. ?Craving a cigarette. ?These symptoms may surprise you, but they are normal to have when quitting smoking. ?To manage withdrawal symptoms: ?Avoid places, people, and acti

## 2021-09-15 LAB — COMPREHENSIVE METABOLIC PANEL
ALT: 20 IU/L (ref 0–44)
AST: 17 IU/L (ref 0–40)
Albumin/Globulin Ratio: 1.7 (ref 1.2–2.2)
Albumin: 4.5 g/dL (ref 3.8–4.9)
Alkaline Phosphatase: 119 IU/L (ref 44–121)
BUN/Creatinine Ratio: 19 (ref 9–20)
BUN: 14 mg/dL (ref 6–24)
Bilirubin Total: <0.2 mg/dL (ref 0.0–1.2)
CO2: 26 mmol/L (ref 20–29)
Calcium: 9.6 mg/dL (ref 8.7–10.2)
Chloride: 100 mmol/L (ref 96–106)
Creatinine, Ser: 0.74 mg/dL — ABNORMAL LOW (ref 0.76–1.27)
Globulin, Total: 2.7 g/dL (ref 1.5–4.5)
Glucose: 143 mg/dL — ABNORMAL HIGH (ref 70–99)
Potassium: 4.3 mmol/L (ref 3.5–5.2)
Sodium: 138 mmol/L (ref 134–144)
Total Protein: 7.2 g/dL (ref 6.0–8.5)
eGFR: 106 mL/min/{1.73_m2} (ref >59)

## 2021-09-15 LAB — CBC
Hematocrit: 40.2 % (ref 37.5–51.0)
Hemoglobin: 13.3 g/dL (ref 13.0–17.7)
MCH: 29.6 pg (ref 26.6–33.0)
MCHC: 33.1 g/dL (ref 31.5–35.7)
MCV: 89 fL (ref 79–97)
Platelets: 154 10*3/uL (ref 150–450)
RBC: 4.5 x10E6/uL (ref 4.14–5.80)
RDW: 15.3 % (ref 11.6–15.4)
WBC: 6.4 10*3/uL (ref 3.4–10.8)

## 2021-09-15 LAB — BRAIN NATRIURETIC PEPTIDE: BNP: 383.1 pg/mL — ABNORMAL HIGH (ref 0.0–100.0)

## 2021-09-16 ENCOUNTER — Encounter (INDEPENDENT_AMBULATORY_CARE_PROVIDER_SITE_OTHER): Payer: PPO | Admitting: Cardiology

## 2021-09-16 ENCOUNTER — Telehealth (HOSPITAL_BASED_OUTPATIENT_CLINIC_OR_DEPARTMENT_OTHER): Payer: Self-pay

## 2021-09-16 DIAGNOSIS — G4733 Obstructive sleep apnea (adult) (pediatric): Secondary | ICD-10-CM

## 2021-09-16 DIAGNOSIS — I480 Paroxysmal atrial fibrillation: Secondary | ICD-10-CM

## 2021-09-16 MED ORDER — POTASSIUM CHLORIDE CRYS ER 10 MEQ PO TBCR: 10.0000 meq | EXTENDED_RELEASE_TABLET | Freq: Every day | ORAL | 3 refills | Status: DC

## 2021-09-16 MED ORDER — FUROSEMIDE 20 MG PO TABS: 20.0000 mg | ORAL_TABLET | Freq: Every day | ORAL | 3 refills | Status: DC

## 2021-09-16 NOTE — Telephone Encounter (Addendum)
Results called to patient who verbalizes understanding!  ? ?Labs ordered and mailed, prescriptions updated and sent to pharmacy on file  ? ?Patient assistance forms also sent with lab slips.  ? ? ? ?----- Message from Loel Dubonnet, NP sent at 09/16/2021  8:16 AM EDT ----- ?BNP elevated indicating volume overload.  Stable kidney function, normal electrolytes, normal liver function. CBC with no evidence of anemia nor infection.  Due to increased BNP recommend addition of Lasix 20 mg daily, potassium 10 mill equivalent daily.  Repeat BMP/BNP in 1 week. ?

## 2021-09-16 NOTE — Telephone Encounter (Signed)
ok to activate itamar. No PA is required by their insurance. PIN [phone] ?

## 2021-09-17 ENCOUNTER — Ambulatory Visit: Payer: PPO

## 2021-09-17 DIAGNOSIS — G4730 Sleep apnea, unspecified: Secondary | ICD-10-CM

## 2021-09-17 DIAGNOSIS — R0683 Snoring: Secondary | ICD-10-CM

## 2021-09-17 NOTE — Procedures (Signed)
? ?  SLEEP STUDY REPORT ?Patient Information ?Study Date: 09/16/21 ?Patient Name: Stephen Roberts ?Patient ID: [id number] ?Birth Date: 10/19/2065 ?Age: 56 ?Gender: Male ?BMI: 35.3 (W=238 lb, H=5' 9'') ?Referring Physician: Laurann Montana, NP ? ?TEST DESCRIPTION: Home sleep apnea testing was completed using the WatchPat, a Type 1 device, utilizing  ?peripheral arterial tonometry (PAT), chest movement, actigraphy, pulse oximetry, pulse rate, body position and snore.  ?AHI was calculated with apnea and hypopnea using valid sleep time as the denominator. RDI includes apneas,  ?hypopneas, and RERAs. The data acquired and the scoring of sleep and all associated events were performed in  ?accordance with the recommended standards and specifications as outlined in the AASM Manual for the Scoring of  ?Sleep and Associated Events 2.2.0 (2015). ? ?FINDINGS: ?1. Moderate Obstructive Sleep Apnea with AHI 18.1/hr.  ?2. No Central Sleep Apnea with pAHIc 0.3/hr. ?3. Oxygen desaturations as low as 73%. ?4. Severe snoring was present. O2 sats were < 88% for 1.6 min. ?5. Total sleep time was 5 hrs and 55 min. ?6. 33% of total sleep time was spent in REM sleep.  ?7. Shortened sleep onset latency at 6 min ?8. Prolonged REM sleep onset latency at 197 min.  ?9. Total awakenings were 16.  ? ?DIAGNOSIS:  ?Moderate Obstructive Sleep Apnea (G47.33) ? ?RECOMMENDATIONS: ?1. Clinical correlation of these findings is necessary. The decision to treat obstructive sleep apnea (OSA) is usually  ?based on the presence of apnea symptoms or the presence of associated medical conditions such as Hypertension,  ?Congestive Heart Failure, Atrial Fibrillation or Obesity. The most common symptoms of OSA are snoring, gasping for  ?breath while sleeping, daytime sleepiness and fatigue.  ? ?2. Initiating apnea therapy is recommended given the presence of symptoms and/or associated conditions.  ?Recommend proceeding with one of the following: ? ? a. Auto-CPAP therapy  with a pressure range of 5-20cm H2O. ? ? b. An oral appliance (OA) that can be obtained from certain dentists with expertise in sleep medicine. These are  ?primarily of use in non-obese patients with mild and moderate disease. ? ? c. An ENT consultation which may be useful to look for specific causes of obstruction and possible treatment  ?options. ? ? d. If patient is intolerant to PAP therapy, consider referral to ENT for evaluation for hypoglossal nerve stimulator.  ? ?3. Close follow-up is necessary to ensure success with CPAP or oral appliance therapy for maximum benefit . ? ?4. A follow-up oximetry study on CPAP is recommended to assess the adequacy of therapy and determine the need  ?for supplemental oxygen or the potential need for Bi-level therapy. An arterial blood gas to determine the adequacy of  ?baseline ventilation and oxygenation should also be considered. ? ?5. Healthy sleep recommendations include: adequate nightly sleep (normal 7-9 hrs/night), avoidance of caffeine after  ?noon and alcohol near bedtime, and maintaining a sleep environment that is cool, dark and quiet. ? ?6. Weight loss for overweight patients is recommended. Even modest amounts of weight loss can significantly  ?improve the severity of sleep apnea. ? ?7. Snoring recommendations include: weight loss where appropriate, side sleeping, and avoidance of alcohol before  ?bed. ? ?8. Operation of motor vehicle should not be performed when sleepy. ? ?Signature: ?Electronically Signed: 09/17/21 ?Fransico Him, MD; [facility]; [place], [place] Board of  Sleep Medicine ? ?

## 2021-09-19 ENCOUNTER — Encounter (HOSPITAL_COMMUNITY): Payer: Self-pay

## 2021-09-19 ENCOUNTER — Emergency Department (HOSPITAL_COMMUNITY)
Admission: EM | Admit: 2021-09-19 | Discharge: 2021-09-19 | Disposition: A | Discharge status: Home / Self Care | Payer: PPO | Attending: Emergency Medicine | Admitting: Emergency Medicine

## 2021-09-19 ENCOUNTER — Other Ambulatory Visit: Payer: Self-pay

## 2021-09-19 ENCOUNTER — Emergency Department (HOSPITAL_COMMUNITY): Payer: PPO

## 2021-09-19 DIAGNOSIS — R0789 Other chest pain: Secondary | ICD-10-CM | POA: Diagnosis not present

## 2021-09-19 DIAGNOSIS — Z79899 Other long term (current) drug therapy: Secondary | ICD-10-CM | POA: Diagnosis not present

## 2021-09-19 DIAGNOSIS — R519 Headache, unspecified: Secondary | ICD-10-CM | POA: Insufficient documentation

## 2021-09-19 DIAGNOSIS — Z7984 Long term (current) use of oral hypoglycemic drugs: Secondary | ICD-10-CM | POA: Diagnosis not present

## 2021-09-19 DIAGNOSIS — I10 Essential (primary) hypertension: Secondary | ICD-10-CM | POA: Insufficient documentation

## 2021-09-19 DIAGNOSIS — Z7901 Long term (current) use of anticoagulants: Secondary | ICD-10-CM | POA: Insufficient documentation

## 2021-09-19 DIAGNOSIS — K449 Diaphragmatic hernia without obstruction or gangrene: Secondary | ICD-10-CM | POA: Diagnosis not present

## 2021-09-19 DIAGNOSIS — R0602 Shortness of breath: Secondary | ICD-10-CM | POA: Diagnosis not present

## 2021-09-19 DIAGNOSIS — E1165 Type 2 diabetes mellitus with hyperglycemia: Secondary | ICD-10-CM | POA: Diagnosis not present

## 2021-09-19 DIAGNOSIS — R110 Nausea: Secondary | ICD-10-CM | POA: Diagnosis not present

## 2021-09-19 LAB — BASIC METABOLIC PANEL
Anion gap: 9 (ref 5–15)
BUN: 21 mg/dL — ABNORMAL HIGH (ref 6–20)
CO2: 28 mmol/L (ref 22–32)
Calcium: 9.7 mg/dL (ref 8.9–10.3)
Chloride: 101 mmol/L (ref 98–111)
Creatinine, Ser: 0.83 mg/dL (ref 0.61–1.24)
GFR, Estimated: >60 mL/min (ref >60)
Glucose, Bld: 139 mg/dL — ABNORMAL HIGH (ref 70–99)
Potassium: 4.5 mmol/L (ref 3.5–5.1)
Sodium: 138 mmol/L (ref 135–145)

## 2021-09-19 LAB — TROPONIN I (HIGH SENSITIVITY)
Troponin I (High Sensitivity): 8 ng/L (ref <18)
Troponin I (High Sensitivity): 8 ng/L (ref <18)

## 2021-09-19 LAB — CBC
HCT: 47 % (ref 39.0–52.0)
Hemoglobin: 15.1 g/dL (ref 13.0–17.0)
MCH: 29.5 pg (ref 26.0–34.0)
MCHC: 32.1 g/dL (ref 30.0–36.0)
MCV: 91.8 fL (ref 80.0–100.0)
Platelets: 161 10*3/uL (ref 150–400)
RBC: 5.12 MIL/uL (ref 4.22–5.81)
RDW: 16.1 % — ABNORMAL HIGH (ref 11.5–15.5)
WBC: 6.4 10*3/uL (ref 4.0–10.5)
nRBC: 0 % (ref 0.0–0.2)

## 2021-09-19 MED ORDER — METOCLOPRAMIDE HCL 5 MG/ML IJ SOLN
10.0000 mg | Freq: Once | INTRAMUSCULAR | Status: AC
  Administered 2021-09-19: 10 mg via INTRAVENOUS
  Filled 2021-09-19: qty 2

## 2021-09-19 MED ORDER — DIPHENHYDRAMINE HCL 50 MG/ML IJ SOLN
12.5000 mg | Freq: Once | INTRAMUSCULAR | Status: AC
  Administered 2021-09-19: 12.5 mg via INTRAVENOUS
  Filled 2021-09-19: qty 1

## 2021-09-19 MED ORDER — CYCLOBENZAPRINE HCL 10 MG PO TABS
5.0000 mg | ORAL_TABLET | Freq: Once | ORAL | Status: AC
  Administered 2021-09-19: 5 mg via ORAL
  Filled 2021-09-19: qty 1

## 2021-09-19 NOTE — ED Provider Notes (Signed)
?[facility] ?Provider Note ? ? ?CSN: [id number] ?Arrival date & time: 09/19/21  0930 ? ?  ? ?History ? ?Chief Complaint  ?Patient presents with  ? Chest Pain  ? ? ?Stephen Roberts is a 56 y.o. male. ? ?HPI ? ?With medical history including diabetes, A-fib on Eliquis, hypertension, presents to the emerged part with complaints of chest tightness shortness of breath and a headache.  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  He states that since getting to the emergency room all of his symptoms have improved.  He denies any unilateral weakness, difficulty with gait, word finding, denies any head trauma, states he been compliant with his anticoag's.  He states that the the chest tightness he has had in the past, this is typical since his catheterization, he states that he was just concerned this feels slightly worse plus the headache.  He denies any pleuritic chest pain, shortness of breath, chest pain at the moment no stomach pains nausea vomiting general body aches.  No recent sick contacts. ? ?Patient was at bedside able to validate the story. ? ?I reviewed patient's chart he was admitted last month for A-fib with RVR, was still having chest pain he was transferred to [facility] where he had cardiac catheterization which was unremarkable.  He was placed on Eliquis and is supposed to have a cardioversion in the next 2 weeks. ? ?Home Medications ?Prior to Admission medications   ?Medication Sig Start Date End Date Taking? Authorizing Provider  ?albuterol (VENTOLIN HFA) 108 (90 Base) MCG/ACT inhaler INHALE 2 PUFFS INTO THE LUNGS EVERY 6 HOURS AS NEEDED FOR WHEEZING OR SHORTNESS OF BREATH 09/08/20   Susy Frizzle, MD  ?apixaban (ELIQUIS) 5 MG TABS tablet Take  1 tablet (5 mg total) by mouth 2 (two) times daily. 09/14/21   Loel Dubonnet, NP  ?Ascorbic Acid (VITAMIN C PO) Take 500 mg by mouth daily.    [provider]  ?buPROPion (WELLBUTRIN SR) 150 MG 12 hr tablet Take 1 tablet (150 mg total) by mouth daily for 3 days, THEN 1 tablet (150 mg total) 2 (two) times daily. 09/14/21 12/10/21  Loel Dubonnet, NP  ?esomeprazole (NEXIUM) 40 MG capsule Take 1 capsule (40 mg total) by mouth daily. 09/01/21   Lindell Spar, MD  ?ferrous sulfate 325 (65 FE) MG EC tablet Take 1 tablet (325 mg total) by mouth 2 (two) times daily. 08/30/21 08/30/22  Lindell Spar, MD  ?furosemide (LASIX) 20 MG tablet Take 1 tablet (20 mg total) by mouth daily. 09/16/21 09/11/22  Loel Dubonnet, NP  ?losartan (COZAAR) 25 MG tablet Take 1 tablet (25 mg total) by mouth daily. 09/14/21 03/13/22  Loel Dubonnet, NP  ?metFORMIN (GLUCOPHAGE) 500 MG tablet Take 1 tablet (500 mg total) by mouth 2 (two) times daily with a meal. 09/01/21   Lindell Spar, MD  ?metoprolol succinate (TOPROL-XL) 50 MG 24 hr tablet Take 1 tablet (50 mg total) by mouth daily. Take with or immediately following a meal. 09/14/21   Loel Dubonnet, NP  ?Multiple Vitamin (MULTIVITAMIN) tablet Take 1 tablet by mouth daily.    [provider]  ?Omega-3 Fatty Acids (OMEGA-3 PO) Take  500 mg by mouth daily.    [provider]  ?oxyCODONE-acetaminophen (PERCOCET/ROXICET) 5-325 MG tablet Take by mouth every 8 (eight) hours as needed for severe pain.    [provider]  ?potassium chloride SA (KLOR-CON M) 10 MEQ tablet Take 1 tablet (10 mEq total) by mouth daily. 09/16/21 09/11/22  Loel Dubonnet, NP  ?Turmeric 500 MG CAPS Take 500 mg by mouth daily.    [provider]  ?vitamin B-12 (CYANOCOBALAMIN) 1000 MCG tablet Take 1,000 mcg by mouth daily.    [provider]  ?   ? ?Allergies    ?Gabapentin and Lyrica [pregabalin]   ? ?Review of Systems   ?Review of Systems  ?Constitutional:  Negative  for chills and fever.  ?Respiratory:  Positive for chest tightness and shortness of breath.   ?Cardiovascular:  Negative for chest pain.  ?Gastrointestinal:  Negative for abdominal pain.  ?Neurological:  Positive for headaches.  ? ?Physical Exam ?Updated Vital Signs ?BP 102/90   Pulse 66   Temp 98.4 ?F (36.9 ?C) (Oral)   Resp 16   Ht '5\' 9"'$  (1.753 m)   Wt 108.4 kg   SpO2 96%   BMI 35.29 kg/m?  ?Physical Exam ?Vitals and nursing note reviewed.  ?Constitutional:   ?   General: He is not in acute distress. ?   Appearance: He is not ill-appearing.  ?HENT:  ?   Head: Normocephalic and atraumatic.  ?   Nose: No congestion.  ?Eyes:  ?   Extraocular Movements: Extraocular movements intact.  ?   Conjunctiva/sclera: Conjunctivae normal.  ?   Pupils: Pupils are equal, round, and reactive to light.  ?Cardiovascular:  ?   Rate and Rhythm: Normal rate and regular rhythm.  ?   Pulses: Normal pulses.  ?   Heart sounds: No murmur heard. ?  No friction rub. No gallop.  ?Pulmonary:  ?   Effort: No respiratory distress.  ?   Breath sounds: No wheezing, rhonchi or rales.  ?Abdominal:  ?   Palpations: Abdomen is soft.  ?   Tenderness: There is no abdominal tenderness. There is no right CVA tenderness or left CVA tenderness.  ?Skin: ?   General: Skin is warm and dry.  ?Neurological:  ?   Mental Status: He is alert.  ?   GCS: GCS eye subscore is 4. GCS verbal subscore is 5. GCS motor subscore is 6.  ?   Cranial Nerves: Cranial nerves 2-12 are intact. No cranial nerve deficit.  ?   Sensory: No sensory deficit.  ?   Motor: No weakness.  ?   Coordination: Coordination is intact. Finger-Nose-Finger Test normal.  ?   Gait: Gait is intact.  ?   Comments: Cranial nerves II through XII grossly intact no difficulty with word finding, following two-step commands, no unilateral weakness present, gait fully intact.  ?Psychiatric:     ?   Mood and Affect: Mood normal.  ? ? ?ED Results / Procedures / Treatments   ?Labs ?(all labs ordered are  listed, but only abnormal results are displayed) ?Labs Reviewed  ?BASIC METABOLIC PANEL - Abnormal; Notable for the following components:  ?    Result Value  ? Glucose, Bld 139 (*)   ? BUN 21 (*)   ? All other components within normal limits  ?CBC - Abnormal; Notable for the following components:  ? RDW 16.1 (*)   ? All other components within normal limits  ?TROPONIN I (HIGH SENSITIVITY)  ?TROPONIN  I (HIGH SENSITIVITY)  ? ? ?EKG ?EKG Interpretation ? ?Date/Time:  Sunday Sep 19 2021 09:41:23 EDT ?Ventricular Rate:  80 ?PR Interval:    ?QRS Duration: 107 ?QT Interval:  387 ?QTC Calculation: 447 ?R Axis:   -81 ?Text Interpretation: Atrial fibrillation Ventricular bigeminy Left anterior fascicular block Abnormal R-wave progression, late transition Nonspecific T abnormalities, lateral leads No acute changes Confirmed by Varney Biles [phone]) on 09/19/2021 11:02:25 AM ? ?Radiology ?DG Chest 2 View ? ?Result Date: 09/19/2021 ?CLINICAL DATA:  56 year old male with history of chest pain and nausea. EXAM: CHEST - 2 VIEW COMPARISON:  Chest x-ray 08/24/2021. FINDINGS: Lung volumes are normal. No consolidative airspace disease. No pleural effusions. No pneumothorax. No pulmonary nodule or mass noted. Moderate-sized hiatal hernia. Pulmonary vasculature and the cardiomediastinal silhouette are otherwise within normal limits. Orthopedic fixation hardware in the lower cervical spine incidentally noted. IMPRESSION: 1. No radiographic evidence of acute cardiopulmonary disease. 2. Moderate-sized hiatal hernia. Electronically Signed   By: Vinnie Langton M.D.   On: 09/19/2021 10:28  ? ?CT Head Wo Contrast ? ?Result Date: 09/19/2021 ?CLINICAL DATA:  56 year old male with chest pain, headache. Atrial fibrillation, awaiting cardioversion. EXAM: CT HEAD WITHOUT CONTRAST TECHNIQUE: Contiguous axial images were obtained from the base of the skull through the vertex without intravenous contrast. RADIATION DOSE REDUCTION: This exam was performed  according to the departmental dose-optimization program which includes automated exposure control, adjustment of the mA and/or kV according to patient size and/or use of iterative reconstruction technique. COMPAR

## 2021-09-19 NOTE — ED Triage Notes (Signed)
Patient with complaints of chest pain and nausea that started this morning. Does have a-fib and is awaiting a cardioversion.  ?

## 2021-09-19 NOTE — Discharge Instructions (Addendum)
Lab work imaging all reassuring please continue all home medications. ? ?Please follow-up with your cardiologist for further evaluation. ? ?Come back to the emergency department if you develop chest pain, shortness of breath, severe abdominal pain, uncontrolled nausea, vomiting, diarrhea. ? ?

## 2021-09-23 ENCOUNTER — Telehealth: Payer: Self-pay | Admitting: *Deleted

## 2021-09-23 DIAGNOSIS — G4733 Obstructive sleep apnea (adult) (pediatric): Secondary | ICD-10-CM

## 2021-09-23 NOTE — Addendum Note (Signed)
Addended by: Freada Bergeron on: 09/23/2021 01:58 PM ? ? Modules accepted: Orders ? ?

## 2021-09-23 NOTE — Telephone Encounter (Signed)
The patient has been notified of the result and verbalized understanding.  All questions (if any) were answered. ?Marolyn Hammock, CMA 09/23/2021 1:32 PM   ? ?Upon patient request DME selection is [facility] ?Patient understands he will be contacted by [facility] to set up his cpap. ?Patient understands to call if [facility] does not contact him with new setup in a timely manner. ?Patient understands they will be called once confirmation has been received from [employer]/ that they have received their new machine to schedule 10 week follow up appointment. ?  ?Real notified of new cpap order  ?Please add to airview ?Patient was grateful for the call and thanked me.  ? ? ?

## 2021-09-23 NOTE — Telephone Encounter (Signed)
-----   Message from Lauralee Evener, [place] sent at 09/17/2021  4:07 PM EDT ----- ? ?----- Message ----- ?From: Sueanne Margarita, MD ?Sent: 09/17/2021   2:18 PM EDT ?To: Cv Div Sleep Studies ? ?Please let patient know that they have sleep apnea and recommend treating with CPAP.  Please order an auto CPAP from 4-15cm H2O with heated humidity and mask of choice.  Order overnight pulse ox on CPAP.  Followup with me in 6 weeks.  ?  ? ?

## 2021-09-27 ENCOUNTER — Inpatient Hospital Stay (HOSPITAL_COMMUNITY): Payer: PPO

## 2021-09-28 ENCOUNTER — Encounter (HOSPITAL_COMMUNITY): Payer: Self-pay | Admitting: Cardiovascular Disease

## 2021-09-28 ENCOUNTER — Other Ambulatory Visit: Payer: Self-pay | Admitting: Family

## 2021-09-28 DIAGNOSIS — Z01812 Encounter for preprocedural laboratory examination: Secondary | ICD-10-CM | POA: Diagnosis not present

## 2021-09-28 DIAGNOSIS — I480 Paroxysmal atrial fibrillation: Secondary | ICD-10-CM | POA: Diagnosis not present

## 2021-09-28 NOTE — Progress Notes (Signed)
Attempted to obtain medical history via telephone, unable to reach at this time. HIPAA compliant voicemail message left requesting return call to pre surgical testing department. 

## 2021-09-29 LAB — CBC
Hematocrit: 45.3 % (ref 37.5–51.0)
Hemoglobin: 15 g/dL (ref 13.0–17.7)
MCH: 29.4 pg (ref 26.6–33.0)
MCHC: 33.1 g/dL (ref 31.5–35.7)
MCV: 89 fL (ref 79–97)
Platelets: 144 10*3/uL — ABNORMAL LOW (ref 150–450)
RBC: 5.1 x10E6/uL (ref 4.14–5.80)
RDW: 15.7 % — ABNORMAL HIGH (ref 11.6–15.4)
WBC: 7.4 10*3/uL (ref 3.4–10.8)

## 2021-09-29 LAB — BASIC METABOLIC PANEL
BUN/Creatinine Ratio: 20 (ref 9–20)
BUN: 19 mg/dL (ref 6–24)
CO2: 22 mmol/L (ref 20–29)
Calcium: 9.8 mg/dL (ref 8.7–10.2)
Chloride: 100 mmol/L (ref 96–106)
Creatinine, Ser: 0.93 mg/dL (ref 0.76–1.27)
Glucose: 132 mg/dL — ABNORMAL HIGH (ref 70–99)
Potassium: 4.6 mmol/L (ref 3.5–5.2)
Sodium: 140 mmol/L (ref 134–144)
eGFR: 96 mL/min/{1.73_m2} (ref >59)

## 2021-09-29 LAB — BRAIN NATRIURETIC PEPTIDE: BNP: 210.8 pg/mL — ABNORMAL HIGH (ref 0.0–100.0)

## 2021-10-01 ENCOUNTER — Telehealth (HOSPITAL_BASED_OUTPATIENT_CLINIC_OR_DEPARTMENT_OTHER): Payer: Self-pay

## 2021-10-01 NOTE — Telephone Encounter (Addendum)
Results called to patient who verbalizes understanding!         ----- Message from Charlie Pitter, PA-C sent at 10/01/2021  7:51 AM EDT ----- Covering Caitlin's inbox, these appear to be followup up labs from earlier this month post visit, also in anticipation of cardioversion. Overall appear stable. BNP downtrending following addition of diuretic and potassium within normal limits. Blood sugar mildly up but has known diabetes. Platelet count is a few points below normal but it has previously dipped a few points low in the past. This can be repeated in follow-up when he sees [facility], please add "consider CBC" to appt notes. Thanks!

## 2021-10-04 ENCOUNTER — Ambulatory Visit (HOSPITAL_COMMUNITY): Payer: PPO | Admitting: Hematology

## 2021-10-05 ENCOUNTER — Encounter (HOSPITAL_COMMUNITY)
Admission: RE | Disposition: A | Discharge status: Home / Self Care | Payer: Self-pay | Source: Home / Self Care | Attending: Cardiovascular Disease

## 2021-10-05 ENCOUNTER — Encounter (HOSPITAL_COMMUNITY): Payer: Self-pay | Admitting: Cardiovascular Disease

## 2021-10-05 ENCOUNTER — Ambulatory Visit (HOSPITAL_COMMUNITY)
Admission: RE | Admit: 2021-10-05 | Discharge: 2021-10-05 | Disposition: A | Discharge status: Home / Self Care | Payer: PPO | Attending: Cardiovascular Disease | Admitting: Cardiovascular Disease

## 2021-10-05 ENCOUNTER — Ambulatory Visit (HOSPITAL_COMMUNITY): Payer: PPO | Admitting: Anesthesiology

## 2021-10-05 ENCOUNTER — Ambulatory Visit (HOSPITAL_BASED_OUTPATIENT_CLINIC_OR_DEPARTMENT_OTHER): Payer: PPO | Admitting: Anesthesiology

## 2021-10-05 DIAGNOSIS — I2510 Atherosclerotic heart disease of native coronary artery without angina pectoris: Secondary | ICD-10-CM | POA: Diagnosis not present

## 2021-10-05 DIAGNOSIS — I110 Hypertensive heart disease with heart failure: Secondary | ICD-10-CM | POA: Insufficient documentation

## 2021-10-05 DIAGNOSIS — E118 Type 2 diabetes mellitus with unspecified complications: Secondary | ICD-10-CM | POA: Insufficient documentation

## 2021-10-05 DIAGNOSIS — F17210 Nicotine dependence, cigarettes, uncomplicated: Secondary | ICD-10-CM | POA: Insufficient documentation

## 2021-10-05 DIAGNOSIS — I4819 Other persistent atrial fibrillation: Secondary | ICD-10-CM | POA: Diagnosis not present

## 2021-10-05 DIAGNOSIS — I493 Ventricular premature depolarization: Secondary | ICD-10-CM | POA: Diagnosis not present

## 2021-10-05 DIAGNOSIS — E785 Hyperlipidemia, unspecified: Secondary | ICD-10-CM | POA: Insufficient documentation

## 2021-10-05 DIAGNOSIS — I10 Essential (primary) hypertension: Secondary | ICD-10-CM | POA: Diagnosis not present

## 2021-10-05 DIAGNOSIS — J449 Chronic obstructive pulmonary disease, unspecified: Secondary | ICD-10-CM | POA: Diagnosis not present

## 2021-10-05 DIAGNOSIS — R0683 Snoring: Secondary | ICD-10-CM | POA: Diagnosis not present

## 2021-10-05 DIAGNOSIS — Z7901 Long term (current) use of anticoagulants: Secondary | ICD-10-CM | POA: Insufficient documentation

## 2021-10-05 DIAGNOSIS — R5383 Other fatigue: Secondary | ICD-10-CM | POA: Diagnosis not present

## 2021-10-05 DIAGNOSIS — E119 Type 2 diabetes mellitus without complications: Secondary | ICD-10-CM

## 2021-10-05 DIAGNOSIS — D6859 Other primary thrombophilia: Secondary | ICD-10-CM | POA: Insufficient documentation

## 2021-10-05 DIAGNOSIS — I4891 Unspecified atrial fibrillation: Secondary | ICD-10-CM

## 2021-10-05 DIAGNOSIS — D509 Iron deficiency anemia, unspecified: Secondary | ICD-10-CM | POA: Diagnosis not present

## 2021-10-05 DIAGNOSIS — I5022 Chronic systolic (congestive) heart failure: Secondary | ICD-10-CM | POA: Diagnosis not present

## 2021-10-05 DIAGNOSIS — I358 Other nonrheumatic aortic valve disorders: Secondary | ICD-10-CM | POA: Diagnosis not present

## 2021-10-05 DIAGNOSIS — I480 Paroxysmal atrial fibrillation: Secondary | ICD-10-CM

## 2021-10-05 HISTORY — PX: CARDIOVERSION: SHX1299

## 2021-10-05 SURGERY — CARDIOVERSION
Anesthesia: General

## 2021-10-05 MED ORDER — LIDOCAINE 2% (20 MG/ML) 5 ML SYRINGE
INTRAMUSCULAR | Status: DC | PRN
Start: 2021-10-05 — End: 2021-10-05
  Administered 2021-10-05: 80 mg via INTRAVENOUS

## 2021-10-05 MED ORDER — SODIUM CHLORIDE 0.9 % IV SOLN: INTRAVENOUS | Status: DC

## 2021-10-05 MED ORDER — PROPOFOL 10 MG/ML IV BOLUS
INTRAVENOUS | Status: DC | PRN
  Administered 2021-10-05: 80 mg via INTRAVENOUS

## 2021-10-05 NOTE — Interval H&P Note (Signed)
History and Physical Interval Note:  10/05/2021 8:04 AM  Stephen Roberts  has presented today for surgery, with the diagnosis of AFIB.  The various methods of treatment have been discussed with the patient and family. After consideration of risks, benefits and other options for treatment, the patient has consented to  Procedure(s): CARDIOVERSION (N/A) as a surgical intervention.  The patient's history has been reviewed, patient examined, no change in status, stable for surgery.  I have reviewed the patient's chart and labs.  Questions were answered to the patient's satisfaction.     Kwynn Schlotter

## 2021-10-05 NOTE — Op Note (Signed)
Procedure: Electrical Cardioversion Indications:  Atrial Fibrillation  Procedure Details:  Consent: Risks of procedure as well as the alternatives and risks of each were explained to the (patient/caregiver).  Consent for procedure obtained.  Time Out: Verified patient identification, verified procedure, site/side was marked, verified correct patient position, special equipment/implants available, medications/allergies/relevent history reviewed, required imaging and test results available.  Performed  Patient placed on cardiac monitor, pulse oximetry, supplemental oxygen as necessary.  Sedation given:  propofol 150 mg IV, Dr. Rex Kras Pacer pads placed anterior and posterior chest.  Cardioverted 1 time(s).  Cardioversion with synchronized biphasic 150J shock.  Evaluation: Findings: Post procedure EKG shows: NSR Complications: None Patient did tolerate procedure well.  Time Spent Directly with the Patient:  30 minutes   Climmie Buelow 10/05/2021, 8:37 AM

## 2021-10-05 NOTE — Anesthesia Postprocedure Evaluation (Signed)
Anesthesia Post Note  Patient: Stephen Roberts  Procedure(s) Performed: CARDIOVERSION     Patient location during evaluation: Endoscopy Anesthesia Type: General Level of consciousness: awake and alert Pain management: pain level controlled Vital Signs Assessment: post-procedure vital signs reviewed and stable Respiratory status: spontaneous breathing, nonlabored ventilation, respiratory function stable and patient connected to nasal cannula oxygen Cardiovascular status: blood pressure returned to baseline and stable Postop Assessment: no apparent nausea or vomiting Anesthetic complications: no   No notable events documented.  Last Vitals:  Vitals:   10/05/21 0845 10/05/21 0855  BP: (!) 134/96 (!) 127/98  Pulse: 76 75  Resp: 15 (!) 21  Temp:    SpO2: 95% 99%    Last Pain:  Vitals:   10/05/21 0855  TempSrc:   PainSc: 0-No pain                 March Rummage Marilou Barnfield

## 2021-10-05 NOTE — Transfer of Care (Signed)
Immediate Anesthesia Transfer of Care Note  Patient: Stephen Roberts  Procedure(s) Performed: CARDIOVERSION  Patient Location: PACU and Endoscopy Unit  Anesthesia Type:General  Level of Consciousness: drowsy  Airway & Oxygen Therapy: Patient Spontanous Breathing  Post-op Assessment: Report given to RN and Post -op Vital signs reviewed and stable  Post vital signs: Reviewed and stable  Last Vitals:  Vitals Value Taken Time  BP    Temp    Pulse    Resp    SpO2      Last Pain:  Vitals:   10/05/21 0810  PainSc: 0-No pain         Complications: No notable events documented.

## 2021-10-05 NOTE — Discharge Instructions (Signed)

## 2021-10-05 NOTE — Anesthesia Preprocedure Evaluation (Addendum)
Anesthesia Evaluation  Patient identified by MRN, date of birth, ID band Patient awake    Reviewed: Allergy & Precautions, NPO status , Patient's Chart, lab work & pertinent test results  Airway Mallampati: III  TM Distance: >3 FB Neck ROM: Full    Dental no notable dental hx.    Pulmonary COPD, former smoker,    Pulmonary exam normal        Cardiovascular hypertension, Pt. on medications and Pt. on home beta blockers + dysrhythmias Atrial Fibrillation + Valvular Problems/Murmurs  Rhythm:Irregular Rate:Normal     Neuro/Psych negative neurological ROS  negative psych ROS   GI/Hepatic Neg liver ROS, GERD  Medicated,  Endo/Other  diabetes  Renal/GU negative Renal ROS  negative genitourinary   Musculoskeletal  (+) Arthritis , Osteoarthritis,    Abdominal (+) + obese,   Peds  Hematology  (+) Blood dyscrasia, anemia ,   Anesthesia Other Findings   Reproductive/Obstetrics                            Anesthesia Physical Anesthesia Plan  ASA: 3  Anesthesia Plan: General   Post-op Pain Management:    Induction: Intravenous  PONV Risk Score and Plan: 2 and Propofol infusion and Treatment may vary due to age or medical condition  Airway Management Planned: Mask  Additional Equipment: None  Intra-op Plan:   Post-operative Plan:   Informed Consent: I have reviewed the patients History and Physical, chart, labs and discussed the procedure including the risks, benefits and alternatives for the proposed anesthesia with the patient or authorized representative who has indicated his/her understanding and acceptance.     Dental advisory given  Plan Discussed with: CRNA  Anesthesia Plan Comments: (ECHO 04/23: 1. Left ventricular ejection fraction, by estimation, is 45 to 50%. The  left ventricle has mildly decreased function. The left ventricle  demonstrates global hypokinesis. There is  mild left ventricular  hypertrophy. Left ventricular diastolic parameters  are indeterminate.  2. Right ventricular systolic function is normal. The right ventricular  size is normal. There is normal pulmonary artery systolic pressure.  3. Left atrial size was severely dilated.  4. Right atrial size was moderately dilated.  5. The mitral valve is normal in structure. Trivial mitral valve  regurgitation. No evidence of mitral stenosis.  6. The aortic valve is tricuspid. Aortic valve regurgitation is not  visualized. Aortic valve sclerosis is present, with no evidence of aortic  valve stenosis.  7. The inferior vena cava is dilated in size with >50% respiratory  variability, suggesting right atrial pressure of 8 mmHg.   Comparison(s): LVEF is slight worse, LA size has increased. )        Anesthesia Quick Evaluation

## 2021-10-06 ENCOUNTER — Encounter (HOSPITAL_COMMUNITY): Payer: Self-pay | Admitting: Cardiovascular Disease

## 2021-10-06 DIAGNOSIS — Z09 Encounter for follow-up examination after completed treatment for conditions other than malignant neoplasm: Secondary | ICD-10-CM | POA: Diagnosis not present

## 2021-10-06 DIAGNOSIS — E1169 Type 2 diabetes mellitus with other specified complication: Secondary | ICD-10-CM | POA: Diagnosis not present

## 2021-10-07 LAB — BASIC METABOLIC PANEL
BUN/Creatinine Ratio: 26 — ABNORMAL HIGH (ref 9–20)
BUN: 20 mg/dL (ref 6–24)
CO2: 24 mmol/L (ref 20–29)
Calcium: 9.9 mg/dL (ref 8.7–10.2)
Chloride: 97 mmol/L (ref 96–106)
Creatinine, Ser: 0.77 mg/dL (ref 0.76–1.27)
Glucose: 138 mg/dL — ABNORMAL HIGH (ref 70–99)
Potassium: 4.3 mmol/L (ref 3.5–5.2)
Sodium: 138 mmol/L (ref 134–144)
eGFR: 104 mL/min/{1.73_m2} (ref >59)

## 2021-10-07 LAB — CBC
Hematocrit: 46.3 % (ref 37.5–51.0)
Hemoglobin: 15.4 g/dL (ref 13.0–17.7)
MCH: 29.3 pg (ref 26.6–33.0)
MCHC: 33.3 g/dL (ref 31.5–35.7)
MCV: 88 fL (ref 79–97)
Platelets: 142 10*3/uL — ABNORMAL LOW (ref 150–450)
RBC: 5.26 x10E6/uL (ref 4.14–5.80)
RDW: 15.7 % — ABNORMAL HIGH (ref 11.6–15.4)
WBC: 6 10*3/uL (ref 3.4–10.8)

## 2021-10-07 LAB — HEMOGLOBIN A1C
Est. average glucose Bld gHb Est-mCnc: 197 mg/dL
Hgb A1c MFr Bld: 8.5 % — ABNORMAL HIGH (ref 4.8–5.6)

## 2021-10-08 ENCOUNTER — Encounter: Payer: PPO | Admitting: Internal Medicine

## 2021-10-18 DIAGNOSIS — G4733 Obstructive sleep apnea (adult) (pediatric): Secondary | ICD-10-CM | POA: Diagnosis not present

## 2021-10-19 ENCOUNTER — Other Ambulatory Visit: Payer: Self-pay

## 2021-10-19 ENCOUNTER — Telehealth (HOSPITAL_BASED_OUTPATIENT_CLINIC_OR_DEPARTMENT_OTHER): Payer: Self-pay | Admitting: Family

## 2021-10-19 ENCOUNTER — Emergency Department (HOSPITAL_COMMUNITY)
Admission: EM | Admit: 2021-10-19 | Discharge: 2021-10-19 | Disposition: A | Discharge status: Home / Self Care | Payer: PPO | Attending: Emergency Medicine | Admitting: Emergency Medicine

## 2021-10-19 ENCOUNTER — Encounter (HOSPITAL_COMMUNITY): Payer: Self-pay | Admitting: Emergency Medicine

## 2021-10-19 ENCOUNTER — Emergency Department (HOSPITAL_COMMUNITY): Payer: PPO

## 2021-10-19 DIAGNOSIS — Z7984 Long term (current) use of oral hypoglycemic drugs: Secondary | ICD-10-CM | POA: Diagnosis not present

## 2021-10-19 DIAGNOSIS — I480 Paroxysmal atrial fibrillation: Secondary | ICD-10-CM | POA: Diagnosis not present

## 2021-10-19 DIAGNOSIS — R002 Palpitations: Secondary | ICD-10-CM | POA: Diagnosis present

## 2021-10-19 DIAGNOSIS — Z7901 Long term (current) use of anticoagulants: Secondary | ICD-10-CM | POA: Insufficient documentation

## 2021-10-19 DIAGNOSIS — K449 Diaphragmatic hernia without obstruction or gangrene: Secondary | ICD-10-CM | POA: Diagnosis not present

## 2021-10-19 DIAGNOSIS — R079 Chest pain, unspecified: Secondary | ICD-10-CM | POA: Diagnosis not present

## 2021-10-19 LAB — CBC
HCT: 43.7 % (ref 39.0–52.0)
Hemoglobin: 14.4 g/dL (ref 13.0–17.0)
MCH: 29.8 pg (ref 26.0–34.0)
MCHC: 33 g/dL (ref 30.0–36.0)
MCV: 90.5 fL (ref 80.0–100.0)
Platelets: 156 10*3/uL (ref 150–400)
RBC: 4.83 MIL/uL (ref 4.22–5.81)
RDW: 16.7 % — ABNORMAL HIGH (ref 11.5–15.5)
WBC: 7.3 10*3/uL (ref 4.0–10.5)
nRBC: 0 % (ref 0.0–0.2)

## 2021-10-19 LAB — BASIC METABOLIC PANEL
Anion gap: 11 (ref 5–15)
BUN: 20 mg/dL (ref 6–20)
CO2: 26 mmol/L (ref 22–32)
Calcium: 9.6 mg/dL (ref 8.9–10.3)
Chloride: 101 mmol/L (ref 98–111)
Creatinine, Ser: 0.8 mg/dL (ref 0.61–1.24)
GFR, Estimated: >60 mL/min (ref >60)
Glucose, Bld: 180 mg/dL — ABNORMAL HIGH (ref 70–99)
Potassium: 4.7 mmol/L (ref 3.5–5.1)
Sodium: 138 mmol/L (ref 135–145)

## 2021-10-19 MED ORDER — PROPOFOL 10 MG/ML IV BOLUS
100.0000 mg | Freq: Once | INTRAVENOUS | Status: AC
Start: 2021-10-19 — End: 2021-10-19
  Administered 2021-10-19: 100 mg via INTRAVENOUS
  Filled 2021-10-19: qty 20

## 2021-10-19 NOTE — Sedation Documentation (Addendum)
Syncronized cardioversion @ 150J. Pt converted to NSR with PVC post cardioversion.

## 2021-10-19 NOTE — Telephone Encounter (Signed)
Patient c/o Palpitations:  High priority if patient c/o lightheadedness, shortness of breath, or chest pain  How long have you had palpitations/irregular HR/ Afib? Started in the middle of the night last night    Are you having the symptoms now? Yes   Are you currently experiencing lightheadedness, SOB or CP? Chest tightness, feels like there is a weight on his chest   Do you have a history of afib (atrial fibrillation) or irregular heart rhythm? yes  Have you checked your BP or HR? (document readings if available): HR 151-155  according to Kardia mobile device  Are you experiencing any other symptoms? Headache , low energy , indigestion   Patient used his CPAP machine for the first time last night

## 2021-10-19 NOTE — Discharge Instructions (Signed)
Be sure to follow-up with your cardiologist as scheduled next week.  Return here for concerning changes in your condition.

## 2021-10-19 NOTE — ED Provider Notes (Signed)
[facility] EMERGENCY DEPARTMENT Provider Note   CSN: [id number] Arrival date & time: 10/19/21  1104     History  Chief Complaint  Patient presents with   Atrial Fibrillation    Stephen Roberts is a 56 y.o. male.  HPI Patient with multiple medical problems including A-fib presents with palpitations, dyspnea, fatigue.  Onset was yesterday, worse after he tried using his CPAP for the first time since recent cardioversion.  At rest minimal discomfort, with activity there is easy fatigability and dyspnea with palpitations.  No fever, vomiting.  He is here with his wife who assists with the history.    Home Medications Prior to Admission medications   Medication Sig Start Date End Date Taking? Authorizing Provider  albuterol (VENTOLIN HFA) 108 (90 Base) MCG/ACT inhaler INHALE 2 PUFFS INTO THE LUNGS EVERY 6 HOURS AS NEEDED FOR WHEEZING OR SHORTNESS OF BREATH 09/08/20  Yes Susy Frizzle, MD  apixaban (ELIQUIS) 5 MG TABS tablet Take 1 tablet (5 mg total) by mouth 2 (two) times daily. 09/14/21  Yes Loel Dubonnet, NP  Ascorbic Acid (VITAMIN C PO) Take 500 mg by mouth daily.   Yes [provider]  esomeprazole (NEXIUM) 40 MG capsule Take 1 capsule (40 mg total) by mouth daily. 09/01/21  Yes Lindell Spar, MD  ferrous sulfate 325 (65 FE) MG EC tablet Take 1 tablet (325 mg total) by mouth 2 (two) times daily. 08/30/21 08/30/22 Yes Lindell Spar, MD  furosemide (LASIX) 20 MG tablet Take 1 tablet (20 mg total) by mouth daily. 09/16/21 09/11/22 Yes Loel Dubonnet, NP  losartan (COZAAR) 25 MG tablet Take 1 tablet (25 mg total) by mouth daily. 09/14/21 03/13/22 Yes Loel Dubonnet, NP  metFORMIN (GLUCOPHAGE) 500 MG tablet Take 1 tablet (500 mg total) by mouth 2 (two) times daily with a meal. Patient taking differently: Take 500 mg by mouth daily with breakfast. 09/01/21  Yes Lindell Spar, MD  metoprolol succinate (TOPROL-XL) 50 MG 24 hr tablet Take 1 tablet (50 mg total) by mouth  daily. Take with or immediately following a meal. 09/14/21  Yes Loel Dubonnet, NP  Multiple Vitamin (MULTIVITAMIN) tablet Take 1 tablet by mouth daily.   Yes [provider]  Omega-3 Fatty Acids (OMEGA-3 PO) Take 500 mg by mouth daily.   Yes [provider]  oxyCODONE-acetaminophen (PERCOCET/ROXICET) 5-325 MG tablet Take 1 tablet by mouth every 8 (eight) hours as needed for severe pain.   Yes [provider]  potassium chloride SA (KLOR-CON M) 10 MEQ tablet Take 1 tablet (10 mEq total) by mouth daily. 09/16/21 09/11/22 Yes Loel Dubonnet, NP  Turmeric 500 MG CAPS Take 500 mg by mouth daily.   Yes [provider]  vitamin B-12 (CYANOCOBALAMIN) 1000 MCG tablet Take 1,000 mcg by mouth daily.   Yes [provider]  buPROPion (WELLBUTRIN SR) 150 MG 12 hr tablet Take 1 tablet (150 mg total) by mouth daily for 3 days, THEN 1 tablet (150 mg total) 2 (two) times daily. Patient not taking: Reported on 09/29/2021 09/14/21 12/10/21  Loel Dubonnet, NP  rosuvastatin (CRESTOR) 20 MG tablet Take 20 mg by mouth daily. Patient not taking: Reported on 10/19/2021    [provider]      Allergies    Gabapentin and Lyrica [pregabalin]    Review of Systems   Review of Systems  All other systems reviewed and are negative.  Physical Exam Updated Vital Signs BP 109/81  Pulse 79   Temp 98.2 F (36.8 C) (Oral)   Resp 16   Ht '5\' 9"'$  (1.753 m)   Wt 104.3 kg   SpO2 96%   BMI 33.97 kg/m  Physical Exam Vitals and nursing note reviewed.  Constitutional:      General: He is not in acute distress.    Appearance: He is well-developed.  HENT:     Head: Normocephalic and atraumatic.  Eyes:     Conjunctiva/sclera: Conjunctivae normal.  Cardiovascular:     Rate and Rhythm: Tachycardia present. Rhythm irregular.  Pulmonary:     Effort: Pulmonary effort is normal. No respiratory distress.     Breath sounds: No stridor.  Abdominal:     General: There is no  distension.  Skin:    General: Skin is warm and dry.  Neurological:     Mental Status: He is alert and oriented to person, place, and time.    ED Results / Procedures / Treatments   Labs (all labs ordered are listed, but only abnormal results are displayed) Labs Reviewed  BASIC METABOLIC PANEL - Abnormal; Notable for the following components:      Result Value   Glucose, Bld 180 (*)    All other components within normal limits  CBC - Abnormal; Notable for the following components:   RDW 16.7 (*)    All other components within normal limits    EKG EKG Interpretation  Date/Time:  Tuesday October 19 2021 11:45:17 EDT Ventricular Rate:  100 PR Interval:    QRS Duration: 99 QT Interval:  361 QTC Calculation: 412 R Axis:   -43 Text Interpretation: Atrial fibrillation Left anterior fascicular block Abnormal R-wave progression, late transition Abnormal ECG Confirmed by Carmin Muskrat [phone]) on 10/19/2021 11:55:25 AM  Radiology DG Chest 2 View  Result Date: 10/19/2021 CLINICAL DATA:  chest pain EXAM: CHEST - 2 VIEW COMPARISON:  None Available. FINDINGS: The cardiomediastinal silhouette is within normal limits. Hiatal hernia. There is no focal airspace consolidation. There is no pleural effusion. No pneumothorax. There is no acute osseous abnormality. Cervical spine fusion hardware noted. IMPRESSION: No evidence of acute cardiopulmonary disease. Electronically Signed   By: Maurine Simmering M.D.   On: 10/19/2021 12:11    Procedures .Sedation  Date/Time: 10/19/2021 3:31 PM Performed by: Carmin Muskrat, MD Authorized by: Carmin Muskrat, MD   Consent:    Consent obtained:  Verbal   Consent given by:  Patient   Risks discussed:  Dysrhythmia, inadequate sedation, nausea and vomiting   Alternatives discussed:  Anxiolysis Universal protocol:    Procedure explained and questions answered to patient or proxy's satisfaction: yes     Relevant documents present and verified: yes     Imaging  studies available: yes     Required blood products, implants, devices, and special equipment available: yes     Site/side marked: yes     Immediately prior to procedure, a time out was called: yes     Patient identity confirmed:  Verbally with patient Pre-sedation assessment:    Time since last food or drink:  2   ASA classification: class 2 - patient with mild systemic disease     Mouth opening:  2 finger widths   Thyromental distance:  2 finger widths   Mallampati score:  II - soft palate, uvula, fauces visible   Neck mobility: normal     Pre-sedation assessments completed and reviewed: airway patency, cardiovascular function, hydration status, mental status, nausea/vomiting, pain level, respiratory function and  temperature     Pre-sedation assessment completed:  10/19/2021 2:00 PM Immediate pre-procedure details:    Reassessment: Patient reassessed immediately prior to procedure     Reviewed: vital signs and relevant labs/tests     Verified: bag valve mask available, emergency equipment available, intubation equipment available, IV patency confirmed, oxygen available, reversal medications available and suction available   Procedure details (see MAR for exact dosages):    Preoxygenation:  Nasal cannula   Sedation:  Propofol   Intended level of sedation: deep   Intra-procedure monitoring:  Blood pressure monitoring, cardiac monitor, continuous pulse oximetry, continuous capnometry, frequent LOC assessments and frequent vital sign checks   Intra-procedure events: none     Intra-procedure management:  Airway suctioning   Total Provider sedation time (minutes):  20 Post-procedure details:    Post-sedation assessment completed:  10/19/2021 3:32 PM   Attendance: Constant attendance by certified staff until patient recovered     Recovery: Patient returned to pre-procedure baseline     Post-sedation assessments completed and reviewed: airway patency, cardiovascular function, hydration status,  mental status, nausea/vomiting, pain level, respiratory function and temperature     Patient is stable for discharge or admission: yes     Procedure completion:  Tolerated well, no immediate complications .Cardioversion  Date/Time: 10/19/2021 3:33 PM Performed by: Carmin Muskrat, MD Authorized by: Carmin Muskrat, MD   Consent:    Consent obtained:  Emergent situation   Consent given by:  Patient   Risks discussed:  Induced arrhythmia, death and pain   Alternatives discussed:  Alternative treatment Universal protocol:    Procedure explained and questions answered to patient or proxy's satisfaction: yes     Relevant documents present and verified: yes     Test results available and properly labeled: yes     Imaging studies available: yes     Required blood products, implants, devices, and special equipment available: yes     Site/side marked: yes     Immediately prior to procedure a time out was called: yes     Patient identity confirmed:  Verbally with patient Pre-procedure details:    Cardioversion basis:  Elective   Rhythm:  Atrial fibrillation   Electrode placement:  Anterior-posterior Patient sedated: Yes. Refer to sedation procedure documentation for details of sedation.  Attempt one:    Cardioversion mode:  Synchronous   Waveform:  Biphasic   Shock (joules) attempt one: 150.   Shock outcome:  Conversion to normal sinus rhythm Post-procedure details:    Patient status:  Awake   Patient tolerance of procedure:  Tolerated well, no immediate complications    Medications Ordered in ED Medications  propofol (DIPRIVAN) 10 mg/mL bolus/IV push 100 mg (100 mg Intravenous Given 10/19/21 1416)    ED Course/ Medical Decision Making/ A&P  This patient with a Hx of A-fib, recent cardioversion presents to the ED for concern of palpitations, dyspnea, this involves an extensive number of treatment options, and is a complaint that carries with it a high risk of complications and  morbidity.    The differential diagnosis includes A-fib, pneumonia, heart failure, ACS   Social Determinants of Health:  Obesity, cardiac disease  Additional history obtained:  Additional history and/or information obtained from wife, cardiology notes, notable for wife for HPI, cardiology notes for cardioversion last month, uncomplicated   After the initial evaluation, orders, including: Labs monitoring were initiated.   Patient placed on Cardiac and Pulse-Oximetry Monitors. The patient was maintained on a cardiac monitor.  The cardiac  monitored showed an rhythm of 140s A-fib abnormal The patient was also maintained on pulse oximetry. The readings were typically 95% room air normal   On repeat evaluation of the patient improved  Lab Tests:  I personally interpreted labs.  The pertinent results include: Mild hyperglycemia  Imaging Studies ordered:  I independently visualized and interpreted imaging which showed unremarkable chest x-ray I agree with the radiologist interpretation   Dispostion / Final MDM:  After consideration of the diagnostic results and the patient's response to treatment, male with history of A-fib, on anticoagulation presents with A-fib, palpitations, fatigue.  Patient evaluation otherwise reassuring, no evidence for concurrent infection.  After consent was obtained patient had successful cardioversion with sedation, return to normal sinus rhythm, with no other complaints.  Given cardioversion here there was initial consideration for hospitalization given the prior nature of his cardioversion, patient received return precautions, follow-up instructions discharged in stable conditions.    Final Clinical Impression(s) / ED Diagnoses Final diagnoses:  Paroxysmal atrial fibrillation ([place])     Carmin Muskrat, MD 10/19/21 1535

## 2021-10-19 NOTE — Telephone Encounter (Signed)
Spoke with patient and he started his CPAP machine last night, states he has not felt right since Woke up with headache  Has been feeling irregular heartbeat in center of chest, tightness, and heavy in chest Took Toprol around 6:30am-7:00am  Kardia mobile checked 4 times and HR 151-155  Discussed with Overton Mam NP who advised him taking another 1/2 Toprol, if HR remains above 120 after 30 minutes go to ED for evaluation If HR improved try to get same day appointment.  Patients cardiologist is Dr Gasper Sells  Advised patient and will call in an hour to follow up

## 2021-10-19 NOTE — ED Triage Notes (Signed)
Pt having chest tightness, SOB and palpitations, recently cardioversion done at [facility] for Afib.

## 2021-10-19 NOTE — Telephone Encounter (Signed)
Spoke with patient and HR 148 Advised to go to ED for evaluation, verbalized understanding

## 2021-10-21 ENCOUNTER — Other Ambulatory Visit (HOSPITAL_COMMUNITY): Payer: PPO

## 2021-10-21 ENCOUNTER — Encounter: Payer: PPO | Admitting: Internal Medicine

## 2021-10-25 ENCOUNTER — Inpatient Hospital Stay (HOSPITAL_COMMUNITY): Payer: PPO | Attending: Hematology

## 2021-10-25 DIAGNOSIS — K219 Gastro-esophageal reflux disease without esophagitis: Secondary | ICD-10-CM | POA: Diagnosis not present

## 2021-10-25 DIAGNOSIS — K118 Other diseases of salivary glands: Secondary | ICD-10-CM

## 2021-10-25 DIAGNOSIS — M549 Dorsalgia, unspecified: Secondary | ICD-10-CM | POA: Insufficient documentation

## 2021-10-25 DIAGNOSIS — E119 Type 2 diabetes mellitus without complications: Secondary | ICD-10-CM | POA: Insufficient documentation

## 2021-10-25 DIAGNOSIS — R059 Cough, unspecified: Secondary | ICD-10-CM | POA: Diagnosis not present

## 2021-10-25 DIAGNOSIS — G479 Sleep disorder, unspecified: Secondary | ICD-10-CM | POA: Insufficient documentation

## 2021-10-25 DIAGNOSIS — R002 Palpitations: Secondary | ICD-10-CM | POA: Insufficient documentation

## 2021-10-25 DIAGNOSIS — D500 Iron deficiency anemia secondary to blood loss (chronic): Secondary | ICD-10-CM | POA: Insufficient documentation

## 2021-10-25 DIAGNOSIS — D110 Benign neoplasm of parotid gland: Secondary | ICD-10-CM | POA: Diagnosis not present

## 2021-10-25 DIAGNOSIS — M542 Cervicalgia: Secondary | ICD-10-CM | POA: Diagnosis not present

## 2021-10-25 DIAGNOSIS — R591 Generalized enlarged lymph nodes: Secondary | ICD-10-CM | POA: Diagnosis not present

## 2021-10-25 DIAGNOSIS — K922 Gastrointestinal hemorrhage, unspecified: Secondary | ICD-10-CM | POA: Diagnosis not present

## 2021-10-25 DIAGNOSIS — Z79899 Other long term (current) drug therapy: Secondary | ICD-10-CM | POA: Insufficient documentation

## 2021-10-25 DIAGNOSIS — Z7901 Long term (current) use of anticoagulants: Secondary | ICD-10-CM | POA: Insufficient documentation

## 2021-10-25 DIAGNOSIS — F17210 Nicotine dependence, cigarettes, uncomplicated: Secondary | ICD-10-CM | POA: Diagnosis not present

## 2021-10-25 DIAGNOSIS — F32A Depression, unspecified: Secondary | ICD-10-CM | POA: Diagnosis not present

## 2021-10-25 DIAGNOSIS — R42 Dizziness and giddiness: Secondary | ICD-10-CM | POA: Insufficient documentation

## 2021-10-25 DIAGNOSIS — R0602 Shortness of breath: Secondary | ICD-10-CM | POA: Insufficient documentation

## 2021-10-25 DIAGNOSIS — R519 Headache, unspecified: Secondary | ICD-10-CM | POA: Diagnosis not present

## 2021-10-25 DIAGNOSIS — K449 Diaphragmatic hernia without obstruction or gangrene: Secondary | ICD-10-CM | POA: Insufficient documentation

## 2021-10-25 DIAGNOSIS — I4891 Unspecified atrial fibrillation: Secondary | ICD-10-CM | POA: Insufficient documentation

## 2021-10-25 DIAGNOSIS — Z981 Arthrodesis status: Secondary | ICD-10-CM | POA: Diagnosis not present

## 2021-10-25 DIAGNOSIS — R079 Chest pain, unspecified: Secondary | ICD-10-CM | POA: Insufficient documentation

## 2021-10-25 LAB — CBC WITH DIFFERENTIAL/PLATELET
Abs Immature Granulocytes: 0.02 10*3/uL (ref 0.00–0.07)
Basophils Absolute: 0 10*3/uL (ref 0.0–0.1)
Basophils Relative: 1 %
Eosinophils Absolute: 0.2 10*3/uL (ref 0.0–0.5)
Eosinophils Relative: 4 %
HCT: 44.2 % (ref 39.0–52.0)
Hemoglobin: 14.3 g/dL (ref 13.0–17.0)
Immature Granulocytes: 0 %
Lymphocytes Relative: 31 %
Lymphs Abs: 1.7 10*3/uL (ref 0.7–4.0)
MCH: 30 pg (ref 26.0–34.0)
MCHC: 32.4 g/dL (ref 30.0–36.0)
MCV: 92.7 fL (ref 80.0–100.0)
Monocytes Absolute: 0.6 10*3/uL (ref 0.1–1.0)
Monocytes Relative: 12 %
Neutro Abs: 2.8 10*3/uL (ref 1.7–7.7)
Neutrophils Relative %: 52 %
Platelets: 137 10*3/uL — ABNORMAL LOW (ref 150–400)
RBC: 4.77 MIL/uL (ref 4.22–5.81)
RDW: 17.2 % — ABNORMAL HIGH (ref 11.5–15.5)
WBC: 5.5 10*3/uL (ref 4.0–10.5)
nRBC: 0 % (ref 0.0–0.2)

## 2021-10-25 LAB — IRON AND TIBC
Iron: 58 ug/dL (ref 45–182)
Saturation Ratios: 16 % — ABNORMAL LOW (ref 17.9–39.5)
TIBC: 355 ug/dL (ref 250–450)
UIBC: 297 ug/dL

## 2021-10-25 LAB — FERRITIN: Ferritin: 43 ng/mL (ref 24–336)

## 2021-10-26 ENCOUNTER — Encounter (HOSPITAL_BASED_OUTPATIENT_CLINIC_OR_DEPARTMENT_OTHER): Payer: Self-pay

## 2021-10-26 ENCOUNTER — Ambulatory Visit (INDEPENDENT_AMBULATORY_CARE_PROVIDER_SITE_OTHER): Payer: PPO

## 2021-10-26 ENCOUNTER — Ambulatory Visit (HOSPITAL_BASED_OUTPATIENT_CLINIC_OR_DEPARTMENT_OTHER): Payer: PPO | Admitting: Family

## 2021-10-26 ENCOUNTER — Telehealth (HOSPITAL_BASED_OUTPATIENT_CLINIC_OR_DEPARTMENT_OTHER): Payer: Self-pay

## 2021-10-26 ENCOUNTER — Encounter (HOSPITAL_BASED_OUTPATIENT_CLINIC_OR_DEPARTMENT_OTHER): Payer: Self-pay | Admitting: Family

## 2021-10-26 VITALS — BP 106/64 | HR 76 | Ht 69.0 in | Wt 239.0 lb

## 2021-10-26 DIAGNOSIS — I480 Paroxysmal atrial fibrillation: Secondary | ICD-10-CM

## 2021-10-26 DIAGNOSIS — I5020 Unspecified systolic (congestive) heart failure: Secondary | ICD-10-CM | POA: Diagnosis not present

## 2021-10-26 DIAGNOSIS — Z720 Tobacco use: Secondary | ICD-10-CM | POA: Diagnosis not present

## 2021-10-26 DIAGNOSIS — D6859 Other primary thrombophilia: Secondary | ICD-10-CM | POA: Diagnosis not present

## 2021-10-26 DIAGNOSIS — I10 Essential (primary) hypertension: Secondary | ICD-10-CM

## 2021-10-26 DIAGNOSIS — E782 Mixed hyperlipidemia: Secondary | ICD-10-CM

## 2021-10-26 DIAGNOSIS — E1165 Type 2 diabetes mellitus with hyperglycemia: Secondary | ICD-10-CM | POA: Diagnosis not present

## 2021-10-26 DIAGNOSIS — G4733 Obstructive sleep apnea (adult) (pediatric): Secondary | ICD-10-CM | POA: Diagnosis not present

## 2021-10-26 MED ORDER — PRAVASTATIN SODIUM 20 MG PO TABS: ORAL_TABLET | ORAL | 1 refills | Status: DC

## 2021-10-26 MED ORDER — DAPAGLIFLOZIN PROPANEDIOL 10 MG PO TABS: 10.0000 mg | ORAL_TABLET | Freq: Every day | ORAL | 5 refills | Status: DC

## 2021-10-26 NOTE — Patient Instructions (Addendum)
Medication Instructions:  Your physician has recommended you make the following change in your medication:    START Dapagliflozin [place]ade) one '10mg'$  tablet daily  START Pravastatin one '20mg'$  tablet three times per week  *If you need a refill on your cardiac medications before your next appointment, please call your pharmacy*   Lab Work: Your physician recommends that you return for lab work in 2 weeks for BMP.   Please return for Lab work. You may come to the...   [facility] (3rd floor) [address], [place], [place] [address]  Open: 8am-Noon and 1pm-4:30pm  Please ring the doorbell on the small table when you exit the elevator and the Lab Tech will come get you  [place] [facility] [address] [place], [place], [place] [address] Open: 8am-1pm, then 2pm-4:30pm   Deputy- Please see attached locations sheet stapled to your lab work with address and hours.    Testing/Procedures: Your physician has recommended that you wear a Zio monitor.   This monitor is a medical device that records the heart's electrical activity. Doctors most often use these monitors to diagnose arrhythmias. Arrhythmias are problems with the speed or rhythm of the heartbeat. The monitor is a small device applied to your chest. You can wear one while you do your normal daily activities. While wearing this monitor if you have any symptoms to push the button and record what you felt. Once you have worn this monitor for the period of time provider prescribed (Usually 14 days), you will return the monitor device in the postage paid box. Once it is returned they will download the data collected and provide [place] with a report which the provider will then review and we will call you with those results. Important tips:  Avoid showering during the first 24 hours of wearing the monitor. Avoid excessive sweating to help maximize wear time. Do not submerge the device, no hot  tubs, and no swimming pools. Keep any lotions or oils away from the patch. After 24 hours you may shower with the patch on. Take brief showers with your back facing the shower head.  Do not remove patch once it has been placed because that will interrupt data and decrease adhesive wear time. Push the button when you have any symptoms and write down what you were feeling. Once you have completed wearing your monitor, remove and place into box which has postage paid and place in your outgoing mailbox.  If for some reason you have misplaced your box then call our office and we can provide another box and/or mail it off for you.   Follow-Up: At [facility], you and your health needs are our priority.  As part of our continuing mission to provide you with exceptional heart care, we have created designated Provider Care Teams.  These Care Teams include your primary Cardiologist (physician) and Advanced Practice Providers (APPs -  Physician Assistants and Nurse Practitioners) who all work together to provide you with the care you need, when you need it.  We recommend signing up for the patient portal called "MyChart".  Sign up information is provided on this After Visit Summary.  MyChart is used to connect with patients for Virtual Visits (Telemedicine).  Patients are able to view lab/test results, encounter notes, upcoming appointments, etc.  Non-urgent messages can be sent to your provider as well.   To learn more about what you can do with MyChart, go to [email or web address].  Your next appointment:   2-3 month(s)  The format for your next appointment:   In Person  Provider:   Werner Lean, MD or Loel Dubonnet, NP    Other Instructions  Heart Healthy Diet Recommendations: A low-salt diet is recommended. Meats should be grilled, baked, or boiled. Avoid fried foods. Focus on lean protein sources like fish or chicken with vegetables and fruits. The American Heart Association  is a Microbiologist!  American Heart Association Diet and Lifeystyle Recommendations   Exercise recommendations: The American Heart Association recommends 150 minutes of moderate intensity exercise weekly. Try 30 minutes of moderate intensity exercise 4-5 times per week. This could include walking, jogging, or swimming.

## 2021-10-26 NOTE — Progress Notes (Signed)
Office Visit    Patient Name: Stephen Roberts Date of Encounter: 10/26/2021  PCP:  Lindell Spar, MD   [place]  Cardiologist:  Werner Lean, MD  Advanced Practice Provider:  No care team member to display Electrophysiologist:  None   Chief Complaint    Stephen Roberts is a 56 y.o. male with a hx of HFmrEF, atrial fibrillation, IDA, Warthin's tumor, HTN, DM2, tobacco use, PVC, hiatal hernia, OSA presents today for follow-up after cardioversion Past Medical History    Past Medical History:  Diagnosis Date   Arthritis    Diabetes ([place])    GERD (gastroesophageal reflux disease)    Heart murmur    Pneumonia 06/27/2006   Past Surgical History:  Procedure Laterality Date   ANTERIOR FUSION CERVICAL SPINE     CARDIOVERSION N/A 10/05/2021   Procedure: CARDIOVERSION;  Surgeon: Sanda Klein, MD;  Location: [facility];  Service: Cardiovascular;  Laterality: N/A;   COLONOSCOPY WITH PROPOFOL N/A 03/26/2020   Procedure: COLONOSCOPY WITH PROPOFOL;  Surgeon: Daneil Dolin, MD;  [facility];  Service: Endoscopy;  Laterality: N/A;   disc in neck Left 05/2017   ESOPHAGEAL DILATION N/A 03/25/2020   Procedure: ESOPHAGEAL DILATION;  Surgeon: Rogene Houston, MD;  [facility];  Service: Endoscopy;  Laterality: N/A;   ESOPHAGOGASTRODUODENOSCOPY (EGD) WITH PROPOFOL N/A 03/25/2020   Procedure: ESOPHAGOGASTRODUODENOSCOPY (EGD) WITH PROPOFOL;  Surgeon: Rogene Houston, MD;  [facility];  Service: Endoscopy;  Laterality: N/A;   GIVENS CAPSULE STUDY N/A 04/13/2020   Procedure: GIVENS CAPSULE STUDY;  Surgeon: Daneil Dolin, MD;  [facility];  Service: Endoscopy;  Laterality: N/A;  7:30am   INSERTION OF MESH N/A 01/23/2013   Procedure: INSERTION OF MESH;  Surgeon: Adin Hector, MD;  Location: [facility];  Service: General;  Laterality: N/A;   LEFT HEART CATH AND CORONARY ANGIOGRAPHY N/A 08/27/2021    Procedure: LEFT HEART CATH AND CORONARY ANGIOGRAPHY;  Surgeon: Martinique, Peter M, MD;  Location: [facility];  Service: Cardiovascular;  Laterality: N/A;   POLYPECTOMY  03/26/2020   Procedure: POLYPECTOMY;  Surgeon: Daneil Dolin, MD;  [facility];  Service: Endoscopy;;   TONSILLECTOMY     as child   VENTRAL HERNIA REPAIR N/A 01/23/2013   Procedure: LAPAROSCOPIC MULTIPLE VENTRAL HERNIAS;  Surgeon: Adin Hector, MD;  Location: [facility];  Service: General;  Laterality: N/A;    Allergies  Allergies  Allergen Reactions   Gabapentin Shortness Of Breath   Lyrica [Pregabalin] Shortness Of Breath   Crestor [Rosuvastatin] Other (See Comments)    myalgia    History of Present Illness    Stephen Roberts is a 56 y.o. male with a hx of HFmrEF, atrial fibrillation, IDA, Warthin's tumor, HTN, DM2, tobacco use, PVC, OSA, hiatal hernia  last seen 09/14/21  Presented to urgent care 08/24/21 where EKG revealed atrial fibrillation. He was transferred to the emergency department. He noted ongoing symptoms for 2 months. Apixaban and Metoprolol initiated. Echo 08/25/21 mildly reduced LVEF 45-50%, mid LVH, LV global hypokinesis, RV normal size and function, LA severely dilated, RA moderately dilated, trivial MR, aortic sclerosis without stenosis. Cardiology was consulted due to chest pain with cardiac cath 08/27/21 with only mild luminal irregularities and normal filling pressures. His A1c was 8, previously diet controlled, and Metformin initiated. Tobacco and etoh cessation were encouraged.   Seen in clinic 09/14/2021.  Cardioversion was  scheduled to be completed after 3 weeks of anticoagulation.  Home sleep study provided in clinic which was diagnostic for sleep apnea and he was recommended for CPAP.  Wellbutrin provided for smoking cessation.  Due to myalgias 2-week statin holiday recommended.  ED visit 09/19/2021 chest tightness and headache.  EKG revealed atrial fibrillation 80 bpm.  CT head  unremarkable.  He underwent cardioversion 10/05/2021 with Dr. Sallyanne Kuster.  He contacted the office 10/19/2021 noted elevated heart rate 150s, palpitations, recurrent atrial fibrillation.  He was recommended proceed to the ED and cardioversion was performed.  He presents today for follow-up with his wife and 80-year-old granddaughter.  He notes he smoked 1 pack of cigarettes over the past 3 months.  Continues to work towards quitting.  Did not like the way Wellbutrin made him feel and has discontinued.  Myalgias have improved since stopping rosuvastatin.  He is agreeable to trialing alternate statin.  Since ED cardioversion has had only rare palpitations but no sustained.  Notes energy level is improving but not yet at baseline.  Still gets worn out more easily than he would anticipate.  He is sleeping with CPAP with nasal pillows but notes it often blows off in the middle of the night and encouraged him to reach out to the DME company regarding a different mask.  Does not he sleeps better with the CPAP on.  EKGs/Labs/Other Studies Reviewed:   The following studies were reviewed today: LHC 09-07-21      LV end diastolic pressure is normal.   Mild nonobstructive CAD Normal LVEDP   Plan: medical therapy.  Dominance: Right Left Main  Vessel was injected. Vessel is normal in caliber. Vessel is angiographically normal.    Left Anterior Descending  Vessel was injected. Vessel is normal in caliber. There is mild diffuse disease throughout the vessel.    Left Circumflex  Vessel was injected. Vessel is normal in caliber. The vessel exhibits minimal luminal irregularities.    Right Coronary Artery  Vessel was injected. Vessel is normal in caliber. There is mild diffuse disease throughout the vessel.     Dominance: Right   TTE 08/25/21:  1. Left ventricular ejection fraction, by estimation, is 45 to 50%. The  left ventricle has mildly decreased function. The left ventricle  demonstrates global  hypokinesis. There is mild left ventricular  hypertrophy. Left ventricular diastolic parameters  are indeterminate.   2. Right ventricular systolic function is normal. The right ventricular  size is normal. There is normal pulmonary artery systolic pressure.   3. Left atrial size was severely dilated.   4. Right atrial size was moderately dilated.   5. The mitral valve is normal in structure. Trivial mitral valve  regurgitation. No evidence of mitral stenosis.   6. The aortic valve is tricuspid. Aortic valve regurgitation is not  visualized. Aortic valve sclerosis is present, with no evidence of aortic  valve stenosis.   7. The inferior vena cava is dilated in size with >50% respiratory  variability, suggesting right atrial pressure of 8 mmHg.     EKG: No EKG today  Recent Labs: 08/24/2021: TSH 1.900 08/25/2021: Magnesium 1.8 09/14/2021: ALT 20 09/28/2021: BNP 210.8 10/19/2021: BUN 20; Creatinine, Ser 0.80; Potassium 4.7; Sodium 138 10/25/2021: Hemoglobin 14.3; Platelets 137  Recent Lipid Panel    Component Value Date/Time   CHOL 159 02/13/2019 1258   TRIG 140 02/13/2019 1258   HDL 38 (L) 02/13/2019 1258   CHOLHDL 4.2 02/13/2019 1258   VLDL 11 08/31/2016 0935  [place] 97 02/13/2019 1258    Risk Assessment/Calculations:   CHA2DS2-VASc Score = 3   This indicates a 3.2% annual risk of stroke. The patient's score is based upon: CHF History: 1 HTN History: 1 Diabetes History: 1 Stroke History: 0 Vascular Disease History: 0 Age Score: 0 Gender Score: 0     Home Medications   Current Meds  Medication Sig   albuterol (VENTOLIN HFA) 108 (90 Base) MCG/ACT inhaler INHALE 2 PUFFS INTO THE LUNGS EVERY 6 HOURS AS NEEDED FOR WHEEZING OR SHORTNESS OF BREATH   apixaban (ELIQUIS) 5 MG TABS tablet Take 1 tablet (5 mg total) by mouth 2 (two) times daily.   Ascorbic Acid (VITAMIN C PO) Take 500 mg by mouth daily.   dapagliflozin propanediol (FARXIGA) 10 MG TABS tablet Take 1 tablet (10  mg total) by mouth daily before breakfast.   esomeprazole (NEXIUM) 40 MG capsule Take 1 capsule (40 mg total) by mouth daily.   ferrous sulfate 325 (65 FE) MG EC tablet Take 1 tablet (325 mg total) by mouth 2 (two) times daily.   furosemide (LASIX) 20 MG tablet Take 1 tablet (20 mg total) by mouth daily.   losartan (COZAAR) 25 MG tablet Take 1 tablet (25 mg total) by mouth daily.   metFORMIN (GLUCOPHAGE) 500 MG tablet Take 1 tablet (500 mg total) by mouth 2 (two) times daily with a meal. (Patient taking differently: Take 500 mg by mouth daily with breakfast.)   metoprolol succinate (TOPROL-XL) 50 MG 24 hr tablet Take 1 tablet (50 mg total) by mouth daily. Take with or immediately following a meal.   Multiple Vitamin (MULTIVITAMIN) tablet Take 1 tablet by mouth daily.   Omega-3 Fatty Acids (OMEGA-3 PO) Take 500 mg by mouth daily.   oxyCODONE-acetaminophen (PERCOCET/ROXICET) 5-325 MG tablet Take 1 tablet by mouth every 8 (eight) hours as needed for severe pain.   potassium chloride SA (KLOR-CON M) 10 MEQ tablet Take 1 tablet (10 mEq total) by mouth daily.   pravastatin (PRAVACHOL) 20 MG tablet Take one tablet three times per week.   Turmeric 500 MG CAPS Take 500 mg by mouth daily.   vitamin B-12 (CYANOCOBALAMIN) 1000 MCG tablet Take 1,000 mcg by mouth daily.     Review of Systems      All other systems reviewed and are otherwise negative except as noted above.  Physical Exam    VS:  BP 106/64   Pulse 76   Ht '5\' 9"'$  (1.753 m)   Wt 239 lb (108.4 kg)   BMI 35.29 kg/m  , BMI Body mass index is 35.29 kg/m.  Wt Readings from Last 3 Encounters:  10/26/21 239 lb (108.4 kg)  10/19/21 230 lb (104.3 kg)  10/05/21 230 lb (104.3 kg)     GEN: Well nourished, well developed, in no acute distress. HEENT: normal. Neck: Supple, no JVD, carotid bruits, or masses. Cardiac: RRR, no murmurs, rubs, or gallops. No clubbing, cyanosis, edema.  Radials/PT 2+ and equal bilaterally.  Respiratory:   Respirations regular and unlabored, clear to auscultation bilaterally. GI: Soft, nontender, nondistended. MS: No deformity or atrophy. Skin: Warm and dry, no rash. Neuro:  Strength and sensation are intact. Psych: Normal affect.  Assessment & Plan    Atrial fibrillation / PVC / Hypercoagulable state - Cardioversion 10/05/2021 the return to PAF 10/19/2021 and underwent second cardioversion in the ED.  Maintaining sinus rhythm by auscultation today. Continue Toprol '50mg'$  QD, Eliquis 5 mg twice daily.  Denies bleeding complications.  CHA2DS2-VASc  Score = 3 [CHF History: 1, HTN History: 1, Diabetes History: 1, Stroke History: 0, Vascular Disease History: 0, Age Score: 0, Gender Score: 0].  Therefore, the patient's annual risk of stroke is 3.2 %.  Still occasional palpitations but not as persistent as previous -14 day ZIO placed in clinic  to assess atrial fibrillation and PVC burden. If significant,consider referral to EP for consideration of AAD. If none noted, consider repeat echo to reassess LVEF.  HFmrEF - Echo 08/2021 LVEF 45-50%, no coronary disease. Consider etiology atrial fib and/or PVC.  Continue Losartan, Metoprolol, Lasix. HF teaching provided today. Add Farxiga '10mg'$  QD with BMP in 1 week for monitoring. Plan to update echo to reassess when in NSR. Consider pending results of ZIO monitor as detailed above.  Referred to PREP exercise program at the [facility].  OSA - Wearing CPAP regularly.  Was likely possible etiology of his atrial fibrillation.  Does note some difficulty with nasal pillow and encouraged to reach out to the DME company to try different mask.    HTN - BP well controlled. Continue current antihypertensive regimen Losartan '25mg'$  QD, Toprol '50mg'$  QD.  Tobacco use -has smoked 1 pack of cigarettes over the last 3 months.  Continues to work towards cessation.  Did not tolerate Wellbutrin with patient reports of palpitations.  Encouraged to use 1-800-QUITNOW.  Previously offered referral to  psychology for stress coping mechanisms which he politely declined.   ETOH use - Complete cessation encouraged.   HLD -noted myalgias on Crestor 20 mg daily.  Improved since statin holiday.  Will trial pravastatin 20 mg 3 times per week.  He will contact our office if he has repeat myalgias.  If noted consider Zetia.  Plan for lipid panel at follow up.   GERD / Hiatal hernia - On Nexium '40mg'$  QD. Has been referred to GI by PCP.   IDA - Follows with hematology. On Feraheme.   DM2 - 08/24/21 A1c 8.0. Metformin initiated during admission. Start Farxiga '10mg'$  QD. Consider repeat A1c in 3 months  Disposition: Follow up in 2-3 months with Werner Lean, MD or APP.  Signed, Loel Dubonnet, NP 10/26/2021, 12:38 PM [place]

## 2021-10-26 NOTE — Telephone Encounter (Signed)
Prior Auth submitted via cover my meds (Key: [id number]) , with return message of " Electronic Prior Authorization not supported. Submit via other meds. [phone]  RN called the above number and they are faxing the paper Prior Auth to [phone].   RN will fill out upon receipt and send back.

## 2021-10-28 ENCOUNTER — Inpatient Hospital Stay (HOSPITAL_COMMUNITY): Payer: PPO | Admitting: Hematology

## 2021-10-28 VITALS — BP 106/83 | HR 88 | Temp 98.1°F | Resp 20 | Ht 67.72 in | Wt 232.8 lb

## 2021-10-28 DIAGNOSIS — R591 Generalized enlarged lymph nodes: Secondary | ICD-10-CM | POA: Diagnosis not present

## 2021-10-28 DIAGNOSIS — D500 Iron deficiency anemia secondary to blood loss (chronic): Secondary | ICD-10-CM

## 2021-10-28 DIAGNOSIS — K118 Other diseases of salivary glands: Secondary | ICD-10-CM

## 2021-10-28 NOTE — Patient Instructions (Signed)
[place] at [facility] Discharge Instructions  You were seen today by Dr. Delton Coombes. He went over your recent results. You will be scheduled for 2 iron (Feraheme) infusions. Dr. Delton Coombes will see you back in 6 months for labs and follow up.   Thank you for choosing [place] at [facility] to provide your oncology and hematology care.  To afford each patient quality time with our provider, please arrive at least 15 minutes before your scheduled appointment time.   If you have a lab appointment with the [facility] please come in thru the Main Entrance and check in at the main information desk  You need to re-schedule your appointment should you arrive 10 or more minutes late.  We strive to give you quality time with our providers, and arriving late affects you and other patients whose appointments are after yours.  Also, if you no show three or more times for appointments you may be dismissed from the clinic at the providers discretion.     Again, thank you for choosing [facility].  Our hope is that these requests will decrease the amount of time that you wait before being seen by our physicians.       _____________________________________________________________  Should you have questions after your visit to [facility], please contact our office at [phone] between the hours of 8:00 a.m. and 4:30 p.m.  Voicemails left after 4:00 p.m. will not be returned until the following business day.  For prescription refill requests, have your pharmacy contact our office and allow 72 hours.    Cancer Center Support Programs:   > Cancer Support Group  2nd Tuesday of the month 1pm-2pm, Journey Room

## 2021-10-28 NOTE — Progress Notes (Signed)
Stephen Roberts, [place] [address]   CLINIC:  Medical Oncology/Hematology  PCP:  Lindell Spar, MD [address] / [place] [address] [phone]   REASON FOR VISIT:  Follow-up for lymphadenopathy and parotid mass  PRIOR THERAPY: none  NGS Results: not done  CURRENT THERAPY: intermittent Venofer last on 08/25/2020  BRIEF ONCOLOGIC HISTORY:  Oncology History   No history exists.    CANCER STAGING: Cancer Staging  No matching staging information was found for the patient.  INTERVAL HISTORY:  [name], a 56 y.o. male, returns for routine follow-up of his lymphadenopathy and parotid mass. Stephen Roberts was last seen on 06/07/21.   Today he reports feeling good. He has a heart monitor place due to A-fib. He reports a sinus infection. He also reports a knot behind his right ear which appeared 1 week ago. His energy levels wax and wanes.   REVIEW OF SYSTEMS:  Review of Systems  Constitutional:  Negative for appetite change and fatigue.  HENT:   Positive for lump/mass (beind right ear).   Respiratory:  Positive for cough and shortness of breath.   Cardiovascular:  Positive for chest pain and palpitations.  Musculoskeletal:  Positive for back pain (7/10) and neck pain (7/10).  Neurological:  Positive for dizziness and headaches.  Psychiatric/Behavioral:  Positive for depression and sleep disturbance. The patient is nervous/anxious.   All other systems reviewed and are negative.   PAST MEDICAL/SURGICAL HISTORY:  Past Medical History:  Diagnosis Date   Arthritis    Diabetes (HCC)    GERD (gastroesophageal reflux disease)    Heart murmur    Pneumonia 06/27/2006   Past Surgical History:  Procedure Laterality Date   ANTERIOR FUSION CERVICAL SPINE     CARDIOVERSION N/A 10/05/2021   Procedure: CARDIOVERSION;  Surgeon: Sanda Klein, MD;  Location: [facility];  Service: Cardiovascular;  Laterality: N/A;   COLONOSCOPY WITH  PROPOFOL N/A 03/26/2020   Procedure: COLONOSCOPY WITH PROPOFOL;  Surgeon: Daneil Dolin, MD;  [facility];  Service: Endoscopy;  Laterality: N/A;   disc in neck Left 05/2017   ESOPHAGEAL DILATION N/A 03/25/2020   Procedure: ESOPHAGEAL DILATION;  Surgeon: Rogene Houston, MD;  [facility];  Service: Endoscopy;  Laterality: N/A;   ESOPHAGOGASTRODUODENOSCOPY (EGD) WITH PROPOFOL N/A 03/25/2020   Procedure: ESOPHAGOGASTRODUODENOSCOPY (EGD) WITH PROPOFOL;  Surgeon: Rogene Houston, MD;  [facility];  Service: Endoscopy;  Laterality: N/A;   GIVENS CAPSULE STUDY N/A 04/13/2020   Procedure: GIVENS CAPSULE STUDY;  Surgeon: Daneil Dolin, MD;  [facility];  Service: Endoscopy;  Laterality: N/A;  7:30am   INSERTION OF MESH N/A 01/23/2013   Procedure: INSERTION OF MESH;  Surgeon: Adin Hector, MD;  Location: [facility];  Service: General;  Laterality: N/A;   LEFT HEART CATH AND CORONARY ANGIOGRAPHY N/A 08/27/2021   Procedure: LEFT HEART CATH AND CORONARY ANGIOGRAPHY;  Surgeon: Martinique, Peter M, MD;  Location: [facility];  Service: Cardiovascular;  Laterality: N/A;   POLYPECTOMY  03/26/2020   Procedure: POLYPECTOMY;  Surgeon: Daneil Dolin, MD;  [facility];  Service: Endoscopy;;   TONSILLECTOMY     as child   VENTRAL HERNIA REPAIR N/A 01/23/2013   Procedure: LAPAROSCOPIC MULTIPLE VENTRAL HERNIAS;  Surgeon: Adin Hector, MD;  Location: [facility];  Service: General;  Laterality: N/A;    SOCIAL HISTORY:  Social History   Socioeconomic History  Marital status: Married    Spouse name: Not on file   Number of children: Not on file   Years of education: Not on file   Highest education level: Not on file  Occupational History   Not on file  Tobacco Use   Smoking status: Former    Packs/day: 0.50    Years: 42.00    Total pack years: 21.00    Types: Cigarettes    Start date: 16   Smokeless tobacco: Never  Substance and  Sexual Activity   Alcohol use: Yes    Comment: moderately   Drug use: Not Currently    Types: Cocaine    Comment: states he has tried it all. Last used cocaine a few months ago.    Sexual activity: Yes  Other Topics Concern   Not on file  Social History Narrative   Not on file   Social Determinants of Health   Financial Resource Strain: Low Risk  (04/20/2020)   Overall Financial Resource Strain (CARDIA)    Difficulty of Paying Living Expenses: Not hard at all  Food Insecurity: No Food Insecurity (04/20/2020)   Hunger Vital Sign    Worried About Running Out of Food in the Last Year: Never true    Ran Out of Food in the Last Year: Never true  Transportation Needs: No Transportation Needs (04/20/2020)   PRAPARE - Hydrologist (Medical): No    Lack of Transportation (Non-Medical): No  Physical Activity: Inactive (04/20/2020)   Exercise Vital Sign    Days of Exercise per Week: 0 days    Minutes of Exercise per Session: 0 min  Stress: Stress Concern Present (04/20/2020)   [place]    Feeling of Stress : Very much  Social Connections: Moderately Isolated (04/20/2020)   Social Connection and Isolation Panel [NHANES]    Frequency of Communication with Friends and Family: More than three times a week    Frequency of Social Gatherings with Friends and Family: More than three times a week    Attends Religious Services: Never    Marine scientist or Organizations: No    Attends Archivist Meetings: Never    Marital Status: Married  Human resources officer Violence: Not At Risk (04/20/2020)   Humiliation, Afraid, Rape, and Kick questionnaire    Fear of Current or Ex-Partner: No    Emotionally Abused: No    Physically Abused: No    Sexually Abused: No    FAMILY HISTORY:  Family History  Problem Relation Age of Onset   Depression Mother    Hearing loss Mother    Hyperlipidemia Mother     Hypertension Mother    Stroke Mother    Early death Father    Diabetes Brother    Pancreatic cancer Paternal Uncle    Colon cancer Neg Hx     CURRENT MEDICATIONS:  Current Outpatient Medications  Medication Sig Dispense Refill   albuterol (VENTOLIN HFA) 108 (90 Base) MCG/ACT inhaler INHALE 2 PUFFS INTO THE LUNGS EVERY 6 HOURS AS NEEDED FOR WHEEZING OR SHORTNESS OF BREATH 18 g 0   apixaban (ELIQUIS) 5 MG TABS tablet Take 1 tablet (5 mg total) by mouth 2 (two) times daily. 60 tablet 5   Ascorbic Acid (VITAMIN C PO) Take 500 mg by mouth daily.     dapagliflozin propanediol (FARXIGA) 10 MG TABS tablet Take 1 tablet (10 mg total) by mouth daily before breakfast.  30 tablet 5   esomeprazole (NEXIUM) 40 MG capsule Take 1 capsule (40 mg total) by mouth daily. 90 capsule 3   ferrous sulfate 325 (65 FE) MG EC tablet Take 1 tablet (325 mg total) by mouth 2 (two) times daily. 60 tablet 3   furosemide (LASIX) 20 MG tablet Take 1 tablet (20 mg total) by mouth daily. 90 tablet 3   losartan (COZAAR) 25 MG tablet Take 1 tablet (25 mg total) by mouth daily. 90 tablet 1   metFORMIN (GLUCOPHAGE) 500 MG tablet Take 1 tablet (500 mg total) by mouth 2 (two) times daily with a meal. (Patient taking differently: Take 500 mg by mouth daily with breakfast.) 60 tablet 5   metoprolol succinate (TOPROL-XL) 50 MG 24 hr tablet Take 1 tablet (50 mg total) by mouth daily. Take with or immediately following a meal. 90 tablet 1   Multiple Vitamin (MULTIVITAMIN) tablet Take 1 tablet by mouth daily.     Omega-3 Fatty Acids (OMEGA-3 PO) Take 500 mg by mouth daily.     oxyCODONE-acetaminophen (PERCOCET/ROXICET) 5-325 MG tablet Take 1 tablet by mouth every 8 (eight) hours as needed for severe pain.     potassium chloride SA (KLOR-CON M) 10 MEQ tablet Take 1 tablet (10 mEq total) by mouth daily. 90 tablet 3   pravastatin (PRAVACHOL) 20 MG tablet Take one tablet three times per week. 30 tablet 1   Turmeric 500 MG CAPS Take 500 mg  by mouth daily.     vitamin B-12 (CYANOCOBALAMIN) 1000 MCG tablet Take 1,000 mcg by mouth daily.     No current facility-administered medications for this visit.   Facility-Administered Medications Ordered in Other Visits  Medication Dose Route Frequency Provider Last Rate Last Admin   iron sucrose (VENOFER) 200 mg in sodium chloride 0.9 % 100 mL IVPB  200 mg Intravenous Once Pennington, Rebekah M, PA-C        ALLERGIES:  Allergies  Allergen Reactions   Gabapentin Shortness Of Breath   Lyrica [Pregabalin] Shortness Of Breath   Crestor [Rosuvastatin] Other (See Comments)    myalgia    PHYSICAL EXAM:  Performance status (ECOG): 1 - Symptomatic but completely ambulatory  There were no vitals filed for this visit. Wt Readings from Last 3 Encounters:  10/26/21 239 lb (108.4 kg)  10/19/21 230 lb (104.3 kg)  10/05/21 230 lb (104.3 kg)   Physical Exam Vitals reviewed.  Constitutional:      Appearance: Normal appearance. He is obese.  Cardiovascular:     Rate and Rhythm: Normal rate and regular rhythm.     Pulses: Normal pulses.     Heart sounds: Normal heart sounds.  Pulmonary:     Effort: Pulmonary effort is normal.     Breath sounds: Normal breath sounds.  Abdominal:     Palpations: Abdomen is soft. There is no hepatomegaly, splenomegaly or mass.     Tenderness: There is no abdominal tenderness.  Musculoskeletal:     Right lower leg: No edema.     Left lower leg: No edema.  Lymphadenopathy:     Cervical: No cervical adenopathy.     Right cervical: No superficial, deep or posterior cervical adenopathy.    Left cervical: No superficial, deep or posterior cervical adenopathy.     Upper Body:     Right upper body: No supraclavicular or axillary adenopathy.     Left upper body: No supraclavicular or axillary adenopathy.     Lower Body: No right inguinal adenopathy. No  left inguinal adenopathy.  Neurological:     General: No focal deficit present.     Mental Status: He is  alert and oriented to person, place, and time.  Psychiatric:        Mood and Affect: Mood normal.        Behavior: Behavior normal.      LABORATORY DATA:  I have reviewed the labs as listed.     Latest Ref Rng & Units 10/25/2021   10:11 AM 10/19/2021   11:47 AM 10/06/2021    8:30 AM  CBC  WBC 4.0 - 10.5 K/uL 5.5  7.3  6.0   Hemoglobin 13.0 - 17.0 g/dL 14.3  14.4  15.4   Hematocrit 39.0 - 52.0 % 44.2  43.7  46.3   Platelets 150 - 400 K/uL 137  156  142       Latest Ref Rng & Units 10/19/2021   11:47 AM 10/06/2021    8:30 AM 09/28/2021    8:48 AM  CMP  Glucose 70 - 99 mg/dL 180  138  132   BUN 6 - 20 mg/dL _0 Creatinine 0.61 - 1.24 mg/dL 0.80  0.77  0.93   Sodium 135 - 145 mmol/L 138  138  140   Potassium 3.5 - 5.1 mmol/L 4.7  4.3  4.6   Chloride 98 - 111 mmol/L 101  97  100   CO2 22 - 32 mmol/L _1 Calcium 8.9 - 10.3 mg/dL 9.6  9.9  9.8     DIAGNOSTIC IMAGING:  I have independently reviewed the scans and discussed with the patient. DG Chest 2 View  Result Date: 10/19/2021 CLINICAL DATA:  chest pain EXAM: CHEST - 2 VIEW COMPARISON:  None Available. FINDINGS: The cardiomediastinal silhouette is within normal limits. Hiatal hernia. There is no focal airspace consolidation. There is no pleural effusion. No pneumothorax. There is no acute osseous abnormality. Cervical spine fusion hardware noted. IMPRESSION: No evidence of acute cardiopulmonary disease. Electronically Signed   By: Maurine Simmering M.D.   On: 10/19/2021 12:11     ASSESSMENT:  1.  Left parotid mass and lymphadenopathy -Patient has had ongoing left-sided neck lymphadenopathy that waxes and wanes, also associated with axillary and inguinal tenderness but without discernible lymphadenopathy -Persistent fatigue since December 2021, no significant weight loss or other B symptoms -Autoimmune screen (RF, ANA) negative; mildly elevated ESR 28, CRP 1.2; HIV nonreactive, CLL FISH negative; hepatitis panel negative,  LDH within normal limits at 170 -Most recent CBC (07/22/2020) within normal limits -CT soft tissue neck (08/04/2020): 17 x 15 x 13 mm mass or lymph node in the inferior aspect of the left parotid gland as well as enlarged level 1 lymph node on the left measuring 15 x 14 x 14 mm; no other asymmetric or pathologically enlarged lymph nodes in neck, but several symmetric lymphadenopathy in neck as described in full radiology report -CT chest (08/04/2020) showed no evidence of thoracic adenopathy or primary malignancy within the chest - Biopsy on 08/17/2020-left parotid nodule consistent with Warthin's tumor.  Left cervical lymph node biopsy showed scant nodal tissue fragments without morphologic/immunophenotypic evidence of lymphoproliferative disorder.   2.  Iron deficiency anemia secondary to occult GI bleed and malabsorption -Patient was hospitalized in November 2021 due to melena and anemia, transfused RBC x2 -Was seen by GI, suspected occult small bowel bleed related to NSAID use, although no definite source of bleeding was found  on EGD or colonoscopy -Hemoglobin within normal limits at 14.5 (07/22/2020) -Iron panel (07/23/2019) shows serum iron 42, percent saturation, ferritin 17 -Likely malabsorption of iron in the setting of concurrent PPI use   3.  Social history -Patient reports every day tobacco use (0.5 to 1 pack/day cigarettes), weekly alcohol use (4-5 beers per week); infrequent illicit drug use (cocaine) -Patient lives at home with his wife, is on disability   PLAN:  1.  Left parotid Warthin's tumor: - CT soft tissue neck showed unchanged 16 x 15 x 18 mm nodule within the inferior aspect of the left parotid gland.  Continue follow-up with Dr. Leonarda Salon.   2.  Iron deficiency anemia secondary to occult GI bleed - Last Feraheme on 06/11/2021 and 06/18/2021. - He is taking iron tablet daily.  His ferritin today is 43 and hemoglobin is 14.3.  Complains of severe fatigue. - We will schedule him for  Feraheme x2.  We will reevaluate him in 6 months with repeat CBC, ferritin and iron panel.  3.  Bilateral neck adenopathy: - CT soft tissue neck on 06/02/2021: Unchanged mildly enlarged bilateral level 2 lymph nodes. - Physical exam today did not reveal any significant adenopathy.  No B symptoms.  We will continue to monitor.   Orders placed this encounter:  No orders of the defined types were placed in this encounter.    Derek Jack, MD [place] [phone]   I, Thana Ates, am acting as a scribe for Dr. Derek Jack.  I, Derek Jack MD, have reviewed the above documentation for accuracy and completeness, and I agree with the above.

## 2021-10-29 ENCOUNTER — Telehealth: Payer: Self-pay

## 2021-10-29 ENCOUNTER — Telehealth (HOSPITAL_BASED_OUTPATIENT_CLINIC_OR_DEPARTMENT_OTHER): Payer: Self-pay

## 2021-10-29 NOTE — Telephone Encounter (Signed)
Received mychart message that the patient's zio had fallen off. Per Neeral Patel (ZIO rep) a new machine has been over nighted to the patient. They will let us know if they need nurse visit to get the new one on.

## 2021-10-29 NOTE — Telephone Encounter (Signed)
Called pt to f/u on PREP referral He asked to call back since he was in the middle of something. Offered Monday. He is agreeable Will call him Monday

## 2021-11-02 ENCOUNTER — Inpatient Hospital Stay (HOSPITAL_COMMUNITY): Payer: PPO

## 2021-11-02 VITALS — BP 116/62 | HR 60 | Temp 97.6°F | Resp 17

## 2021-11-02 DIAGNOSIS — D500 Iron deficiency anemia secondary to blood loss (chronic): Secondary | ICD-10-CM

## 2021-11-02 DIAGNOSIS — R591 Generalized enlarged lymph nodes: Secondary | ICD-10-CM | POA: Diagnosis not present

## 2021-11-02 MED ORDER — LORATADINE 10 MG PO TABS
10.0000 mg | ORAL_TABLET | Freq: Once | ORAL | Status: AC
  Administered 2021-11-02: 10 mg via ORAL
  Filled 2021-11-02: qty 1

## 2021-11-02 MED ORDER — SODIUM CHLORIDE 0.9 % IV SOLN
510.0000 mg | Freq: Once | INTRAVENOUS | Status: AC
  Administered 2021-11-02: 510 mg via INTRAVENOUS
  Filled 2021-11-02: qty 17

## 2021-11-02 MED ORDER — SODIUM CHLORIDE 0.9 % IV SOLN: Freq: Once | INTRAVENOUS | Status: AC

## 2021-11-02 MED ORDER — ACETAMINOPHEN 325 MG PO TABS
650.0000 mg | ORAL_TABLET | Freq: Once | ORAL | Status: AC
  Administered 2021-11-02: 650 mg via ORAL
  Filled 2021-11-02: qty 2

## 2021-11-02 NOTE — Patient Instructions (Signed)
[facility]  Discharge Instructions: Thank you for choosing [facility] to provide your oncology and hematology care.  If you have a lab appointment with the [facility], please come in thru the Main Entrance and check in at the main information desk.  Wear comfortable clothing and clothing appropriate for easy access to any Portacath or PICC line.   We strive to give you quality time with your provider. You may need to reschedule your appointment if you arrive late (15 or more minutes).  Arriving late affects you and other patients whose appointments are after yours.  Also, if you miss three or more appointments without notifying the office, you may be dismissed from the clinic at the provider's discretion.      For prescription refill requests, have your pharmacy contact our office and allow 72 hours for refills to be completed.    Today you received the following today. Feraheme infusion.   To help prevent nausea and vomiting after your treatment, we encourage you to take your nausea medication as directed.  BELOW ARE SYMPTOMS THAT SHOULD BE REPORTED IMMEDIATELY: *FEVER GREATER THAN 100.4 F (38 C) OR HIGHER *CHILLS OR SWEATING *NAUSEA AND VOMITING THAT IS NOT CONTROLLED WITH YOUR NAUSEA MEDICATION *UNUSUAL SHORTNESS OF BREATH *UNUSUAL BRUISING OR BLEEDING *URINARY PROBLEMS (pain or burning when urinating, or frequent urination) *BOWEL PROBLEMS (unusual diarrhea, constipation, pain near the anus) TENDERNESS IN MOUTH AND THROAT WITH OR WITHOUT PRESENCE OF ULCERS (sore throat, sores in mouth, or a toothache) UNUSUAL RASH, SWELLING OR PAIN  UNUSUAL VAGINAL DISCHARGE OR ITCHING   Items with * indicate a potential emergency and should be followed up as soon as possible or go to the Emergency Department if any problems should occur.  Please show the CHEMOTHERAPY ALERT CARD or IMMUNOTHERAPY ALERT CARD at check-in to the Emergency Department and triage  nurse.  Should you have questions after your visit or need to cancel or reschedule your appointment, please contact [facility] [phone]  and follow the prompts.  Office hours are 8:00 a.m. to 4:30 p.m. Monday - Friday. Please note that voicemails left after 4:00 p.m. may not be returned until the following business day.  We are closed weekends and major holidays. You have access to a nurse at all times for urgent questions. Please call the main number to the clinic [phone] and follow the prompts.  For any non-urgent questions, you may also contact your provider using MyChart. We now offer e-Visits for anyone 46 and older to request care online for non-urgent symptoms. For details visit [email or web address].   Also download the MyChart app! Go to the app store, search "MyChart", open the app, select [facility], and log in with your MyChart username and password.  Masks are optional in the cancer centers. If you would like for your care team to wear a mask while they are taking care of you, please let them know. For doctor visits, patients may have with them one support person who is at least 58 years old. At this time, visitors are not allowed in the infusion area.

## 2021-11-02 NOTE — Progress Notes (Signed)
Feraheme infusion given per orders. Patient tolerated it well without problems. Vitals stable and discharged home from clinic ambulatory. Follow up as scheduled.  

## 2021-11-04 ENCOUNTER — Telehealth: Payer: Self-pay | Admitting: Cardiology

## 2021-11-04 DIAGNOSIS — I5020 Unspecified systolic (congestive) heart failure: Secondary | ICD-10-CM | POA: Diagnosis not present

## 2021-11-04 DIAGNOSIS — I480 Paroxysmal atrial fibrillation: Secondary | ICD-10-CM | POA: Diagnosis not present

## 2021-11-04 NOTE — Telephone Encounter (Signed)
What problem are you experiencing? Patient states that he is still staying awake all night and is worse now than he was before the machine.   Who is your medical equipment company? [employer]   Please route to the sleep study assistant.

## 2021-11-08 MED FILL — Ferumoxytol Inj 510 MG/17ML (30 MG/ML) (Elemental Fe): INTRAVENOUS | Qty: 17 | Status: AC

## 2021-11-09 ENCOUNTER — Other Ambulatory Visit: Payer: Self-pay | Admitting: *Deleted

## 2021-11-09 ENCOUNTER — Emergency Department (HOSPITAL_COMMUNITY): Payer: PPO

## 2021-11-09 ENCOUNTER — Encounter (HOSPITAL_COMMUNITY): Payer: Self-pay | Admitting: Emergency Medicine

## 2021-11-09 ENCOUNTER — Telehealth: Payer: Self-pay | Admitting: Internal Medicine

## 2021-11-09 ENCOUNTER — Emergency Department (HOSPITAL_COMMUNITY)
Admission: EM | Admit: 2021-11-09 | Discharge: 2021-11-09 | Disposition: A | Discharge status: Home / Self Care | Payer: PPO | Attending: Emergency Medicine | Admitting: Emergency Medicine

## 2021-11-09 ENCOUNTER — Other Ambulatory Visit: Payer: Self-pay

## 2021-11-09 ENCOUNTER — Inpatient Hospital Stay (HOSPITAL_COMMUNITY): Payer: PPO

## 2021-11-09 DIAGNOSIS — Z7901 Long term (current) use of anticoagulants: Secondary | ICD-10-CM | POA: Insufficient documentation

## 2021-11-09 DIAGNOSIS — R079 Chest pain, unspecified: Secondary | ICD-10-CM | POA: Diagnosis not present

## 2021-11-09 DIAGNOSIS — I4891 Unspecified atrial fibrillation: Secondary | ICD-10-CM | POA: Insufficient documentation

## 2021-11-09 DIAGNOSIS — I4819 Other persistent atrial fibrillation: Secondary | ICD-10-CM

## 2021-11-09 DIAGNOSIS — F17210 Nicotine dependence, cigarettes, uncomplicated: Secondary | ICD-10-CM | POA: Insufficient documentation

## 2021-11-09 DIAGNOSIS — I499 Cardiac arrhythmia, unspecified: Secondary | ICD-10-CM | POA: Diagnosis not present

## 2021-11-09 LAB — BASIC METABOLIC PANEL
Anion gap: 9 (ref 5–15)
BUN: 29 mg/dL — ABNORMAL HIGH (ref 6–20)
CO2: 25 mmol/L (ref 22–32)
Calcium: 9.3 mg/dL (ref 8.9–10.3)
Chloride: 105 mmol/L (ref 98–111)
Creatinine, Ser: 1.06 mg/dL (ref 0.61–1.24)
GFR, Estimated: >60 mL/min (ref >60)
Glucose, Bld: 152 mg/dL — ABNORMAL HIGH (ref 70–99)
Potassium: 4.3 mmol/L (ref 3.5–5.1)
Sodium: 139 mmol/L (ref 135–145)

## 2021-11-09 LAB — CBC
HCT: 45.2 % (ref 39.0–52.0)
Hemoglobin: 14.9 g/dL (ref 13.0–17.0)
MCH: 30.7 pg (ref 26.0–34.0)
MCHC: 33 g/dL (ref 30.0–36.0)
MCV: 93.2 fL (ref 80.0–100.0)
Platelets: 155 10*3/uL (ref 150–400)
RBC: 4.85 MIL/uL (ref 4.22–5.81)
RDW: 17.5 % — ABNORMAL HIGH (ref 11.5–15.5)
WBC: 7.2 10*3/uL (ref 4.0–10.5)
nRBC: 0 % (ref 0.0–0.2)

## 2021-11-09 MED ORDER — METOPROLOL SUCCINATE ER 50 MG PO TB24: 75.0000 mg | ORAL_TABLET | Freq: Every day | ORAL | 1 refills | Status: DC

## 2021-11-09 NOTE — Telephone Encounter (Signed)
Patient called to say that he is in the hospital. Wanted the dr to know and to look at his chart. Please advise

## 2021-11-11 DIAGNOSIS — I480 Paroxysmal atrial fibrillation: Secondary | ICD-10-CM | POA: Diagnosis not present

## 2021-11-11 DIAGNOSIS — I5020 Unspecified systolic (congestive) heart failure: Secondary | ICD-10-CM | POA: Diagnosis not present

## 2021-11-13 ENCOUNTER — Encounter: Payer: Self-pay | Admitting: Emergency Medicine

## 2021-11-13 ENCOUNTER — Other Ambulatory Visit: Payer: Self-pay

## 2021-11-13 ENCOUNTER — Ambulatory Visit (INDEPENDENT_AMBULATORY_CARE_PROVIDER_SITE_OTHER)
Admission: EM | Admit: 2021-11-13 | Discharge: 2021-11-13 | Disposition: A | Discharge status: Home / Self Care | Payer: PPO | Source: Home / Self Care

## 2021-11-13 DIAGNOSIS — J309 Allergic rhinitis, unspecified: Secondary | ICD-10-CM | POA: Diagnosis present

## 2021-11-13 DIAGNOSIS — D509 Iron deficiency anemia, unspecified: Secondary | ICD-10-CM | POA: Diagnosis present

## 2021-11-13 DIAGNOSIS — F1010 Alcohol abuse, uncomplicated: Secondary | ICD-10-CM | POA: Diagnosis present

## 2021-11-13 DIAGNOSIS — Z981 Arthrodesis status: Secondary | ICD-10-CM | POA: Diagnosis not present

## 2021-11-13 DIAGNOSIS — I10 Essential (primary) hypertension: Secondary | ICD-10-CM | POA: Diagnosis not present

## 2021-11-13 DIAGNOSIS — Z833 Family history of diabetes mellitus: Secondary | ICD-10-CM | POA: Diagnosis not present

## 2021-11-13 DIAGNOSIS — I358 Other nonrheumatic aortic valve disorders: Secondary | ICD-10-CM | POA: Diagnosis present

## 2021-11-13 DIAGNOSIS — R079 Chest pain, unspecified: Secondary | ICD-10-CM | POA: Diagnosis not present

## 2021-11-13 DIAGNOSIS — I428 Other cardiomyopathies: Secondary | ICD-10-CM | POA: Diagnosis present

## 2021-11-13 DIAGNOSIS — I4891 Unspecified atrial fibrillation: Secondary | ICD-10-CM | POA: Diagnosis not present

## 2021-11-13 DIAGNOSIS — R509 Fever, unspecified: Secondary | ICD-10-CM | POA: Diagnosis not present

## 2021-11-13 DIAGNOSIS — E1169 Type 2 diabetes mellitus with other specified complication: Secondary | ICD-10-CM | POA: Diagnosis present

## 2021-11-13 DIAGNOSIS — E119 Type 2 diabetes mellitus without complications: Secondary | ICD-10-CM | POA: Diagnosis not present

## 2021-11-13 DIAGNOSIS — G894 Chronic pain syndrome: Secondary | ICD-10-CM | POA: Diagnosis present

## 2021-11-13 DIAGNOSIS — G4733 Obstructive sleep apnea (adult) (pediatric): Secondary | ICD-10-CM | POA: Diagnosis present

## 2021-11-13 DIAGNOSIS — K219 Gastro-esophageal reflux disease without esophagitis: Secondary | ICD-10-CM | POA: Diagnosis present

## 2021-11-13 DIAGNOSIS — I4819 Other persistent atrial fibrillation: Secondary | ICD-10-CM | POA: Diagnosis present

## 2021-11-13 DIAGNOSIS — I110 Hypertensive heart disease with heart failure: Secondary | ICD-10-CM | POA: Diagnosis present

## 2021-11-13 DIAGNOSIS — R61 Generalized hyperhidrosis: Secondary | ICD-10-CM

## 2021-11-13 DIAGNOSIS — A4151 Sepsis due to Escherichia coli [E. coli]: Secondary | ICD-10-CM | POA: Diagnosis present

## 2021-11-13 DIAGNOSIS — M1990 Unspecified osteoarthritis, unspecified site: Secondary | ICD-10-CM | POA: Diagnosis present

## 2021-11-13 DIAGNOSIS — I5022 Chronic systolic (congestive) heart failure: Secondary | ICD-10-CM | POA: Diagnosis present

## 2021-11-13 DIAGNOSIS — F17200 Nicotine dependence, unspecified, uncomplicated: Secondary | ICD-10-CM | POA: Diagnosis not present

## 2021-11-13 DIAGNOSIS — I493 Ventricular premature depolarization: Secondary | ICD-10-CM | POA: Diagnosis present

## 2021-11-13 DIAGNOSIS — Z7901 Long term (current) use of anticoagulants: Secondary | ICD-10-CM | POA: Diagnosis not present

## 2021-11-13 DIAGNOSIS — Z83438 Family history of other disorder of lipoprotein metabolism and other lipidemia: Secondary | ICD-10-CM | POA: Diagnosis not present

## 2021-11-13 DIAGNOSIS — J449 Chronic obstructive pulmonary disease, unspecified: Secondary | ICD-10-CM | POA: Diagnosis present

## 2021-11-13 DIAGNOSIS — E785 Hyperlipidemia, unspecified: Secondary | ICD-10-CM | POA: Diagnosis present

## 2021-11-13 DIAGNOSIS — Z8249 Family history of ischemic heart disease and other diseases of the circulatory system: Secondary | ICD-10-CM | POA: Diagnosis not present

## 2021-11-13 DIAGNOSIS — E669 Obesity, unspecified: Secondary | ICD-10-CM | POA: Diagnosis present

## 2021-11-13 DIAGNOSIS — N390 Urinary tract infection, site not specified: Secondary | ICD-10-CM

## 2021-11-13 DIAGNOSIS — N3000 Acute cystitis without hematuria: Secondary | ICD-10-CM | POA: Diagnosis present

## 2021-11-13 HISTORY — DX: Unspecified atrial fibrillation: I48.91

## 2021-11-13 LAB — POCT URINALYSIS DIP (MANUAL ENTRY)
Bilirubin, UA: NEGATIVE
Glucose, UA: 500 mg/dL — AB
Ketones, POC UA: NEGATIVE mg/dL
Nitrite, UA: POSITIVE — AB
Protein Ur, POC: 30 mg/dL — AB
Spec Grav, UA: 1.015 (ref 1.010–1.025)
Urobilinogen, UA: 0.2 E.U./dL
pH, UA: 5.5 (ref 5.0–8.0)

## 2021-11-13 MED ORDER — ACETAMINOPHEN 500 MG PO TABS
1000.0000 mg | ORAL_TABLET | Freq: Once | ORAL | Status: AC
  Administered 2021-11-13: 1000 mg via ORAL

## 2021-11-13 MED ORDER — ACETAMINOPHEN 325 MG PO TABS: 650.0000 mg | ORAL_TABLET | Freq: Once | ORAL | Status: DC

## 2021-11-13 MED ORDER — SULFAMETHOXAZOLE-TRIMETHOPRIM 800-160 MG PO TABS: 1.0000 | ORAL_TABLET | Freq: Two times a day (BID) | ORAL | 0 refills | Status: DC

## 2021-11-13 MED ORDER — CEFTRIAXONE SODIUM 500 MG IJ SOLR
500.0000 mg | Freq: Once | INTRAMUSCULAR | Status: AC
  Administered 2021-11-13: 500 mg via INTRAMUSCULAR

## 2021-11-13 NOTE — ED Triage Notes (Signed)
Pt reports right sided flank pain on Monday night and reports lasted x2 days. Pt reports pain radiated around to front and reports pain has settled in bladder. Pt reports intense dysuria, fever, and pressure in urethra/penis.   Pt reports newly diagnosed with a-fib on Tuesday. Pt reports is currently waiting for surgical consultation in august for ablation. Pt denies chest pain, shortness of breath.

## 2021-11-13 NOTE — ED Provider Notes (Addendum)
[facility]    CSN: [id number] Arrival date & time: 11/13/21  1141      History   Chief Complaint Chief Complaint  Patient presents with   Urinary Retention    HPI Stephen Roberts is a 56 y.o. male.   Presenting today with almost a week of developing varied symptoms that have worsened the past 2 days.  States initially he was having some low back soreness which he was attributing to doing some heavy outdoor work but over time his symptoms have progressed into urinary frequency, urinary hesitancy, severe suprapubic pain and pressure and pain that feels like needles stabbing him in his urethra and penis.  He denies known hematuria, nausea, vomiting but does have a fever that he first noticed today.  He has not been taking anything over-the-counter for symptoms.  He started [place] for the first time just prior to onset of symptoms and is concerned this may be causing his urinary tract infection.  Otherwise, no new changes recently.    Past Medical History:  Diagnosis Date   A-fib ([place])    Arthritis    Diabetes ([place])    GERD (gastroesophageal reflux disease)    Heart murmur    Pneumonia 06/27/2006    Patient Active Problem List   Diagnosis Date Noted   Persistent atrial fibrillation [facility])    Hospital discharge follow-up 09/01/2021   Intermittent chest pain 08/26/2021   Atrial fibrillation with RVR  08/24/2021   Essential hypertension 08/24/2021   Iron deficiency anemia 08/24/2021   S/P cervical spinal fusion 06/30/2021   Chronic obstructive pulmonary disease ([place]) 06/30/2021   GERD (gastroesophageal reflux disease) 06/30/2021   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   Recurrent sinus infections 05/04/2020   Iron deficiency anemia due to chronic blood loss    Generalized weakness 03/24/2020   Dysphagia    Hyperlipidemia 02/13/2019   Type 2 diabetes mellitus with other specified complication ([place]) 44/05/270   Cervical disc disorder with radiculopathy, high  cervical region 05/30/2017   Chronic pain syndrome 11/10/2014   Allergic rhinitis 07/30/2014   Allergic dermatitis 07/30/2014   Prostate cancer screening 07/30/2014   Incarcerated epigastric hernia 01/11/2013   Incarcerated umbilical hernia 53/66/4403   Tobacco abuse 01/11/2013    Past Surgical History:  Procedure Laterality Date   ANTERIOR FUSION CERVICAL SPINE     CARDIOVERSION N/A 10/05/2021   Procedure: CARDIOVERSION;  Surgeon: Sanda Klein, MD;  Location: [place];  Service: Cardiovascular;  Laterality: N/A;   COLONOSCOPY WITH PROPOFOL N/A 03/26/2020   Procedure: COLONOSCOPY WITH PROPOFOL;  Surgeon: Daneil Dolin, MD;  [facility];  Service: Endoscopy;  Laterality: N/A;   disc in neck Left 05/2017   ESOPHAGEAL DILATION N/A 03/25/2020   Procedure: ESOPHAGEAL DILATION;  Surgeon: Rogene Houston, MD;  [facility];  Service: Endoscopy;  Laterality: N/A;   ESOPHAGOGASTRODUODENOSCOPY (EGD) WITH PROPOFOL N/A 03/25/2020   Procedure: ESOPHAGOGASTRODUODENOSCOPY (EGD) WITH PROPOFOL;  Surgeon: Rogene Houston, MD;  [facility];  Service: Endoscopy;  Laterality: N/A;   GIVENS CAPSULE STUDY N/A 04/13/2020   Procedure: GIVENS CAPSULE STUDY;  Surgeon: Daneil Dolin, MD;  [facility];  Service: Endoscopy;  Laterality: N/A;  7:30am   INSERTION OF MESH N/A 01/23/2013   Procedure: INSERTION OF MESH;  Surgeon: Adin Hector, MD;  [facility];  Service: General;  Laterality: N/A;   LEFT HEART CATH AND CORONARY ANGIOGRAPHY N/A 08/27/2021   Procedure: LEFT  HEART CATH AND CORONARY ANGIOGRAPHY;  Surgeon: Martinique, Peter M, MD;  Location: [facility];  Service: Cardiovascular;  Laterality: N/A;   POLYPECTOMY  03/26/2020   Procedure: POLYPECTOMY;  Surgeon: Daneil Dolin, MD;  [facility];  Service: Endoscopy;;   TONSILLECTOMY     as child   VENTRAL HERNIA REPAIR N/A 01/23/2013   Procedure: LAPAROSCOPIC MULTIPLE VENTRAL  HERNIAS;  Surgeon: Adin Hector, MD;  [facility];  Service: General;  Laterality: N/A;       Home Medications    Prior to Admission medications   Medication Sig Start Date End Date Taking? Authorizing Provider  dapagliflozin propanediol (FARXIGA) 10 MG TABS tablet Take 1 tablet (10 mg total) by mouth daily before breakfast. 10/26/21  Yes Loel Dubonnet, NP  sulfamethoxazole-trimethoprim (BACTRIM DS) 800-160 MG tablet Take 1 tablet by mouth 2 (two) times daily. 11/13/21  Yes Volney American, PA-C  albuterol (VENTOLIN HFA) 108 (90 Base) MCG/ACT inhaler INHALE 2 PUFFS INTO THE LUNGS EVERY 6 HOURS AS NEEDED FOR WHEEZING OR SHORTNESS OF BREATH 09/08/20   Susy Frizzle, MD  apixaban (ELIQUIS) 5 MG TABS tablet Take 1 tablet (5 mg total) by mouth 2 (two) times daily. 09/14/21   Loel Dubonnet, NP  Ascorbic Acid (VITAMIN C PO) Take 500 mg by mouth daily.    [provider]  esomeprazole (NEXIUM) 40 MG capsule Take 1 capsule (40 mg total) by mouth daily. 09/01/21   Lindell Spar, MD  ferrous sulfate 325 (65 FE) MG EC tablet Take 1 tablet (325 mg total) by mouth 2 (two) times daily. 08/30/21 08/30/22  Lindell Spar, MD  furosemide (LASIX) 20 MG tablet Take 1 tablet (20 mg total) by mouth daily. 09/16/21 09/11/22  Loel Dubonnet, NP  Lactobacillus Rhamnosus, GG, (PROBIOTIC COLIC) LIQD Take by mouth.    [provider]  losartan (COZAAR) 25 MG tablet Take 1 tablet (25 mg total) by mouth daily. 09/14/21 03/13/22  Loel Dubonnet, NP  metFORMIN (GLUCOPHAGE) 500 MG tablet Take 1 tablet (500 mg total) by mouth 2 (two) times daily with a meal. Patient taking differently: Take 500 mg by mouth daily with breakfast. 09/01/21   Lindell Spar, MD  metoprolol succinate (TOPROL-XL) 50 MG 24 hr tablet Take 1.5 tablets (75 mg total) by mouth daily. 11/09/21 12/09/21  Noemi Chapel, MD  Multiple Vitamin (MULTIVITAMIN) tablet Take 1 tablet by mouth daily.    [provider]   oxyCODONE-acetaminophen (PERCOCET/ROXICET) 5-325 MG tablet Take 1 tablet by mouth every 8 (eight) hours as needed for severe pain.    [provider]  potassium chloride SA (KLOR-CON M) 10 MEQ tablet Take 1 tablet (10 mEq total) by mouth daily. 09/16/21 09/11/22  Loel Dubonnet, NP  pravastatin (PRAVACHOL) 20 MG tablet Take one tablet three times per week. 10/26/21   Loel Dubonnet, NP  Turmeric 500 MG CAPS Take 500 mg by mouth daily.    [provider]  vitamin B-12 (CYANOCOBALAMIN) 1000 MCG tablet Take 1,000 mcg by mouth daily.    [provider]    Family History Family History  Problem Relation Age of Onset   Depression Mother    Hearing loss Mother    Hyperlipidemia Mother    Hypertension Mother    Stroke Mother    Early death Father    Diabetes Brother    Pancreatic cancer Paternal Uncle    Colon cancer Neg Hx  Social History Social History   Tobacco Use   Smoking status: Former    Packs/day: 0.50    Years: 42.00    Total pack years: 21.00    Types: Cigarettes    Start date: 1980   Smokeless tobacco: Never  Substance Use Topics   Alcohol use: Yes    Comment: moderately   Drug use: Not Currently    Types: Cocaine    Comment: states he has tried it all. Last used cocaine a few months ago.      Allergies   Gabapentin, Lyrica [pregabalin], and Crestor [rosuvastatin]   Review of Systems Review of Systems Per HPI  Physical Exam Triage Vital Signs ED Triage Vitals  Enc Vitals Group     BP 11/13/21 1156 138/67     Pulse Rate 11/13/21 1156 77     Resp 11/13/21 1156 18     Temp 11/13/21 1156 (!) 102.4 F (39.1 C)     Temp Source 11/13/21 1156 Oral     SpO2 11/13/21 1156 94 %     Weight --      Height --      Head Circumference --      Peak Flow --      Pain Score 11/13/21 1157 2     Pain Loc --      Pain Edu? --      Excl. in [place]? --    No data found.  Updated Vital Signs BP 138/67 (BP Location: Right Arm)   Pulse  77   Temp (!) 100.6 F (38.1 C) (Oral)   Resp 18   SpO2 94%   Visual Acuity Right Eye Distance:   Left Eye Distance:   Bilateral Distance:    Right Eye Near:   Left Eye Near:    Bilateral Near:     Physical Exam Vitals and nursing note reviewed.  Constitutional:      Appearance: Normal appearance. He is diaphoretic.  HENT:     Head: Atraumatic.     Mouth/Throat:     Mouth: Mucous membranes are moist.  Eyes:     Extraocular Movements: Extraocular movements intact.     Conjunctiva/sclera: Conjunctivae normal.  Cardiovascular:     Rate and Rhythm: Normal rate.  Pulmonary:     Effort: Pulmonary effort is normal.     Breath sounds: Normal breath sounds.  Abdominal:     General: Bowel sounds are normal. There is no distension.     Palpations: Abdomen is soft.     Tenderness: There is no abdominal tenderness. There is no right CVA tenderness, left CVA tenderness or guarding.  Musculoskeletal:        General: Normal range of motion.     Cervical back: Normal range of motion and neck supple.  Skin:    General: Skin is warm.  Neurological:     General: No focal deficit present.     Mental Status: He is oriented to person, place, and time.  Psychiatric:        Mood and Affect: Mood normal.        Thought Content: Thought content normal.        Judgment: Judgment normal.      UC Treatments / Results  Labs (all labs ordered are listed, but only abnormal results are displayed) Labs Reviewed  POCT URINALYSIS DIP (MANUAL ENTRY) - Abnormal; Notable for the following components:      Result Value   Glucose, UA =500 (*)  Blood, UA trace-intact (*)    Protein Ur, POC =30 (*)    Nitrite, UA Positive (*)    Leukocytes, UA Trace (*)    All other components within normal limits  URINE CULTURE  CBC WITH DIFFERENTIAL/PLATELET  BASIC METABOLIC PANEL    EKG   Radiology No results found.  Procedures Procedures (including critical care time)  Medications Ordered in  UC Medications  acetaminophen (TYLENOL) tablet 1,000 mg (1,000 mg Oral Given 11/13/21 1206)  cefTRIAXone (ROCEPHIN) injection 500 mg (500 mg Intramuscular Given 11/13/21 1255)    Initial Impression / Assessment and Plan / UC Course  I have reviewed the triage vital signs and the nursing notes.  Pertinent labs & imaging results that were available during my care of the patient were reviewed by me and considered in my medical decision making (see chart for details).     Febrile in triage, Tylenol given at this time.  His temperature did improve by 2 degrees prior to discharge today.  Suspect his diaphoresis is secondary to fever breaking.  His urinalysis today shows a significant urinary tract infection, urine culture, CBC, BMP pending.  Will give IM Rocephin prior to discharge and an extended course of Bactrim p.o.  Close PCP follow-up recommended for Monday and ED for any worsening symptoms at any time.  Push fluids, rest  Final Clinical Impressions(s) / UC Diagnoses   Final diagnoses:  Fever, unspecified  Acute lower UTI  Diaphoresis   Discharge Instructions   None    ED Prescriptions     Medication Sig Dispense Auth. Provider   sulfamethoxazole-trimethoprim (BACTRIM DS) 800-160 MG tablet Take 1 tablet by mouth 2 (two) times daily. 20 tablet Volney American, [place]      PDMP not reviewed this encounter.   Volney American, [place] 11/13/21 [place], [place], [place] 11/13/21 1320

## 2021-11-13 NOTE — ED Notes (Signed)
Pt given cup of ice water with med administration and attempting to void at this time.

## 2021-11-14 ENCOUNTER — Other Ambulatory Visit: Payer: Self-pay

## 2021-11-14 ENCOUNTER — Emergency Department (HOSPITAL_COMMUNITY): Payer: PPO

## 2021-11-14 ENCOUNTER — Telehealth: Payer: Self-pay | Admitting: Cardiology

## 2021-11-14 ENCOUNTER — Inpatient Hospital Stay (HOSPITAL_COMMUNITY)
Admission: EM | Admit: 2021-11-14 | Discharge: 2021-11-18 | DRG: 872 | Disposition: A | Discharge status: Home / Self Care | Payer: PPO | Attending: Internal Medicine | Admitting: Internal Medicine

## 2021-11-14 ENCOUNTER — Encounter: Payer: Self-pay | Admitting: Cardiology

## 2021-11-14 ENCOUNTER — Encounter (HOSPITAL_COMMUNITY): Payer: Self-pay | Admitting: Emergency Medicine

## 2021-11-14 DIAGNOSIS — J309 Allergic rhinitis, unspecified: Secondary | ICD-10-CM | POA: Diagnosis present

## 2021-11-14 DIAGNOSIS — I10 Essential (primary) hypertension: Secondary | ICD-10-CM | POA: Diagnosis present

## 2021-11-14 DIAGNOSIS — I4819 Other persistent atrial fibrillation: Secondary | ICD-10-CM | POA: Diagnosis present

## 2021-11-14 DIAGNOSIS — I4891 Unspecified atrial fibrillation: Principal | ICD-10-CM | POA: Diagnosis present

## 2021-11-14 DIAGNOSIS — G4733 Obstructive sleep apnea (adult) (pediatric): Secondary | ICD-10-CM | POA: Diagnosis present

## 2021-11-14 DIAGNOSIS — I358 Other nonrheumatic aortic valve disorders: Secondary | ICD-10-CM | POA: Diagnosis present

## 2021-11-14 DIAGNOSIS — J449 Chronic obstructive pulmonary disease, unspecified: Secondary | ICD-10-CM | POA: Diagnosis present

## 2021-11-14 DIAGNOSIS — Z888 Allergy status to other drugs, medicaments and biological substances status: Secondary | ICD-10-CM

## 2021-11-14 DIAGNOSIS — I428 Other cardiomyopathies: Secondary | ICD-10-CM | POA: Diagnosis present

## 2021-11-14 DIAGNOSIS — Z79899 Other long term (current) drug therapy: Secondary | ICD-10-CM

## 2021-11-14 DIAGNOSIS — E785 Hyperlipidemia, unspecified: Secondary | ICD-10-CM | POA: Diagnosis present

## 2021-11-14 DIAGNOSIS — I493 Ventricular premature depolarization: Secondary | ICD-10-CM | POA: Diagnosis present

## 2021-11-14 DIAGNOSIS — Z7984 Long term (current) use of oral hypoglycemic drugs: Secondary | ICD-10-CM

## 2021-11-14 DIAGNOSIS — E1169 Type 2 diabetes mellitus with other specified complication: Secondary | ICD-10-CM | POA: Diagnosis present

## 2021-11-14 DIAGNOSIS — N390 Urinary tract infection, site not specified: Secondary | ICD-10-CM

## 2021-11-14 DIAGNOSIS — I480 Paroxysmal atrial fibrillation: Secondary | ICD-10-CM | POA: Diagnosis present

## 2021-11-14 DIAGNOSIS — I5022 Chronic systolic (congestive) heart failure: Secondary | ICD-10-CM | POA: Diagnosis present

## 2021-11-14 DIAGNOSIS — G894 Chronic pain syndrome: Secondary | ICD-10-CM | POA: Diagnosis present

## 2021-11-14 DIAGNOSIS — A4151 Sepsis due to Escherichia coli [E. coli]: Principal | ICD-10-CM | POA: Diagnosis present

## 2021-11-14 DIAGNOSIS — Z833 Family history of diabetes mellitus: Secondary | ICD-10-CM

## 2021-11-14 DIAGNOSIS — Z7901 Long term (current) use of anticoagulants: Secondary | ICD-10-CM

## 2021-11-14 DIAGNOSIS — F1010 Alcohol abuse, uncomplicated: Secondary | ICD-10-CM | POA: Diagnosis present

## 2021-11-14 DIAGNOSIS — N3000 Acute cystitis without hematuria: Secondary | ICD-10-CM | POA: Diagnosis not present

## 2021-11-14 DIAGNOSIS — Z981 Arthrodesis status: Secondary | ICD-10-CM

## 2021-11-14 DIAGNOSIS — Z87891 Personal history of nicotine dependence: Secondary | ICD-10-CM

## 2021-11-14 DIAGNOSIS — D509 Iron deficiency anemia, unspecified: Secondary | ICD-10-CM | POA: Diagnosis present

## 2021-11-14 DIAGNOSIS — E669 Obesity, unspecified: Secondary | ICD-10-CM | POA: Diagnosis present

## 2021-11-14 DIAGNOSIS — Z7141 Alcohol abuse counseling and surveillance of alcoholic: Secondary | ICD-10-CM

## 2021-11-14 DIAGNOSIS — Z83438 Family history of other disorder of lipoprotein metabolism and other lipidemia: Secondary | ICD-10-CM

## 2021-11-14 DIAGNOSIS — M1990 Unspecified osteoarthritis, unspecified site: Secondary | ICD-10-CM | POA: Diagnosis present

## 2021-11-14 DIAGNOSIS — Z8249 Family history of ischemic heart disease and other diseases of the circulatory system: Secondary | ICD-10-CM

## 2021-11-14 DIAGNOSIS — K219 Gastro-esophageal reflux disease without esophagitis: Secondary | ICD-10-CM | POA: Diagnosis present

## 2021-11-14 DIAGNOSIS — I110 Hypertensive heart disease with heart failure: Secondary | ICD-10-CM | POA: Diagnosis present

## 2021-11-14 LAB — CBC WITH DIFFERENTIAL/PLATELET
Basophils Absolute: 0.1 10*3/uL (ref 0.0–0.2)
Basos: 0 %
EOS (ABSOLUTE): 0 10*3/uL (ref 0.0–0.4)
Eos: 0 %
Hematocrit: 43.4 % (ref 37.5–51.0)
Hemoglobin: 15 g/dL (ref 13.0–17.7)
Immature Grans (Abs): 0 10*3/uL (ref 0.0–0.1)
Immature Granulocytes: 0 %
Lymphocytes Absolute: 1.6 10*3/uL (ref 0.7–3.1)
Lymphs: 10 %
MCH: 31.3 pg (ref 26.6–33.0)
MCHC: 34.6 g/dL (ref 31.5–35.7)
MCV: 91 fL (ref 79–97)
Monocytes Absolute: 1.5 10*3/uL — ABNORMAL HIGH (ref 0.1–0.9)
Monocytes: 10 %
Neutrophils Absolute: 12.1 10*3/uL — ABNORMAL HIGH (ref 1.4–7.0)
Neutrophils: 80 %
Platelets: 129 10*3/uL — ABNORMAL LOW (ref 150–450)
RBC: 4.79 x10E6/uL (ref 4.14–5.80)
RDW: 17.7 % — ABNORMAL HIGH (ref 11.6–15.4)
WBC: 15.3 10*3/uL — ABNORMAL HIGH (ref 3.4–10.8)

## 2021-11-14 LAB — BASIC METABOLIC PANEL
Anion gap: 9 (ref 5–15)
BUN/Creatinine Ratio: 13 (ref 9–20)
BUN: 14 mg/dL (ref 6–24)
BUN: 19 mg/dL (ref 6–20)
CO2: 19 mmol/L — ABNORMAL LOW (ref 20–29)
CO2: 23 mmol/L (ref 22–32)
Calcium: 9.2 mg/dL (ref 8.7–10.2)
Calcium: 8.7 mg/dL — ABNORMAL LOW (ref 8.9–10.3)
Chloride: 98 mmol/L (ref 96–106)
Chloride: 103 mmol/L (ref 98–111)
Creatinine, Ser: 1.07 mg/dL (ref 0.76–1.27)
Creatinine, Ser: 0.92 mg/dL (ref 0.61–1.24)
GFR, Estimated: >60 mL/min (ref >60)
Glucose, Bld: 260 mg/dL — ABNORMAL HIGH (ref 70–99)
Glucose: 203 mg/dL — ABNORMAL HIGH (ref 70–99)
Potassium: 4.4 mmol/L (ref 3.5–5.2)
Potassium: 3.5 mmol/L (ref 3.5–5.1)
Sodium: 137 mmol/L (ref 134–144)
Sodium: 135 mmol/L (ref 135–145)
eGFR: 81 mL/min/{1.73_m2} (ref >59)

## 2021-11-14 LAB — CBC
HCT: 41.1 % (ref 39.0–52.0)
Hemoglobin: 13.7 g/dL (ref 13.0–17.0)
MCH: 30.9 pg (ref 26.0–34.0)
MCHC: 33.3 g/dL (ref 30.0–36.0)
MCV: 92.8 fL (ref 80.0–100.0)
Platelets: 106 10*3/uL — ABNORMAL LOW (ref 150–400)
RBC: 4.43 MIL/uL (ref 4.22–5.81)
RDW: 17.5 % — ABNORMAL HIGH (ref 11.5–15.5)
WBC: 9.2 10*3/uL (ref 4.0–10.5)
nRBC: 0 % (ref 0.0–0.2)

## 2021-11-14 LAB — PROTIME-INR
INR: 1.3 — ABNORMAL HIGH (ref 0.8–1.2)
Prothrombin Time: 16.4 seconds — ABNORMAL HIGH (ref 11.4–15.2)

## 2021-11-14 LAB — GLUCOSE, CAPILLARY
Glucose-Capillary: 135 mg/dL — ABNORMAL HIGH (ref 70–99)
Glucose-Capillary: 221 mg/dL — ABNORMAL HIGH (ref 70–99)

## 2021-11-14 LAB — LACTIC ACID, PLASMA
Lactic Acid, Venous: 1.1 mmol/L (ref 0.5–1.9)
Lactic Acid, Venous: 1.3 mmol/L (ref 0.5–1.9)

## 2021-11-14 LAB — APTT: aPTT: 35 seconds (ref 24–36)

## 2021-11-14 LAB — TROPONIN I (HIGH SENSITIVITY)
Troponin I (High Sensitivity): 7 ng/L (ref <18)
Troponin I (High Sensitivity): 7 ng/L (ref <18)

## 2021-11-14 LAB — MAGNESIUM: Magnesium: 1.8 mg/dL (ref 1.7–2.4)

## 2021-11-14 MED ORDER — IBUPROFEN 200 MG PO TABS: 800.0000 mg | ORAL_TABLET | Freq: Four times a day (QID) | ORAL | Status: DC | PRN

## 2021-11-14 MED ORDER — DILTIAZEM HCL 30 MG PO TABS
30.0000 mg | ORAL_TABLET | Freq: Once | ORAL | Status: AC
  Administered 2021-11-14: 30 mg via ORAL
  Filled 2021-11-14: qty 1

## 2021-11-14 MED ORDER — TURMERIC 500 MG PO CAPS: 500.0000 mg | ORAL_CAPSULE | Freq: Every day | ORAL | Status: DC

## 2021-11-14 MED ORDER — SODIUM CHLORIDE 0.9 % IV SOLN
1.0000 g | INTRAVENOUS | Status: AC
  Administered 2021-11-14 – 2021-11-16 (×3): 1 g via INTRAVENOUS
  Filled 2021-11-14 (×2): qty 10

## 2021-11-14 MED ORDER — PANTOPRAZOLE SODIUM 40 MG PO TBEC
40.0000 mg | DELAYED_RELEASE_TABLET | Freq: Every day | ORAL | Status: DC
  Administered 2021-11-14 – 2021-11-18 (×5): 40 mg via ORAL
  Filled 2021-11-14 (×5): qty 1

## 2021-11-14 MED ORDER — ONDANSETRON HCL 4 MG PO TABS: 4.0000 mg | ORAL_TABLET | Freq: Four times a day (QID) | ORAL | Status: DC | PRN

## 2021-11-14 MED ORDER — OXYCODONE-ACETAMINOPHEN 5-325 MG PO TABS: 1.0000 | ORAL_TABLET | Freq: Three times a day (TID) | ORAL | Status: DC | PRN

## 2021-11-14 MED ORDER — SODIUM CHLORIDE 0.9% FLUSH: 3.0000 mL | INTRAVENOUS | Status: DC | PRN

## 2021-11-14 MED ORDER — METFORMIN HCL 500 MG PO TABS
500.0000 mg | ORAL_TABLET | Freq: Every day | ORAL | Status: DC
  Administered 2021-11-15 – 2021-11-17 (×3): 500 mg via ORAL
  Filled 2021-11-14 (×3): qty 1

## 2021-11-14 MED ORDER — ONDANSETRON HCL 4 MG/2ML IJ SOLN: 4.0000 mg | Freq: Four times a day (QID) | INTRAMUSCULAR | Status: DC | PRN

## 2021-11-14 MED ORDER — VITAMIN B-12 1000 MCG PO TABS
1000.0000 ug | ORAL_TABLET | Freq: Every day | ORAL | Status: DC
  Administered 2021-11-14 – 2021-11-18 (×5): 1000 ug via ORAL
  Filled 2021-11-14 (×5): qty 1

## 2021-11-14 MED ORDER — ACETAMINOPHEN 325 MG PO TABS
650.0000 mg | ORAL_TABLET | Freq: Four times a day (QID) | ORAL | Status: DC | PRN
  Administered 2021-11-14: 650 mg via ORAL
  Filled 2021-11-14: qty 2

## 2021-11-14 MED ORDER — METOPROLOL SUCCINATE ER 50 MG PO TB24
75.0000 mg | ORAL_TABLET | Freq: Every day | ORAL | Status: DC
Start: 2021-11-15 — End: 2021-11-16
  Administered 2021-11-15: 75 mg via ORAL
  Filled 2021-11-14: qty 1

## 2021-11-14 MED ORDER — METOPROLOL TARTRATE 5 MG/5ML IV SOLN
5.0000 mg | Freq: Four times a day (QID) | INTRAVENOUS | Status: DC | PRN
  Administered 2021-11-15: 5 mg via INTRAVENOUS
  Filled 2021-11-14: qty 5

## 2021-11-14 MED ORDER — ACETAMINOPHEN 650 MG RE SUPP: 650.0000 mg | Freq: Four times a day (QID) | RECTAL | Status: DC | PRN

## 2021-11-14 MED ORDER — PRAVASTATIN SODIUM 40 MG PO TABS
20.0000 mg | ORAL_TABLET | ORAL | Status: DC
  Administered 2021-11-15 – 2021-11-17 (×2): 20 mg via ORAL
  Filled 2021-11-14: qty 1
  Filled 2021-11-14 (×2): qty 2

## 2021-11-14 MED ORDER — INSULIN ASPART 100 UNIT/ML IJ SOLN
0.0000 [IU] | Freq: Three times a day (TID) | INTRAMUSCULAR | Status: DC
  Administered 2021-11-14 – 2021-11-15 (×2): 2 [IU] via SUBCUTANEOUS
  Administered 2021-11-15: 5 [IU] via SUBCUTANEOUS
  Administered 2021-11-15: 3 [IU] via SUBCUTANEOUS
  Administered 2021-11-16: 8 [IU] via SUBCUTANEOUS
  Administered 2021-11-16 – 2021-11-17 (×2): 3 [IU] via SUBCUTANEOUS
  Administered 2021-11-17: 5 [IU] via SUBCUTANEOUS
  Administered 2021-11-18: 3 [IU] via SUBCUTANEOUS
  Administered 2021-11-18: 2 [IU] via SUBCUTANEOUS

## 2021-11-14 MED ORDER — ACETAMINOPHEN ER 650 MG PO TBCR: 1300.0000 mg | EXTENDED_RELEASE_TABLET | Freq: Three times a day (TID) | ORAL | Status: DC | PRN

## 2021-11-14 MED ORDER — ADULT MULTIVITAMIN W/MINERALS CH
1.0000 | ORAL_TABLET | Freq: Every day | ORAL | Status: DC
Start: 2021-11-14 — End: 2021-11-19
  Administered 2021-11-14 – 2021-11-18 (×5): 1 via ORAL
  Filled 2021-11-14 (×5): qty 1

## 2021-11-14 MED ORDER — INSULIN ASPART 100 UNIT/ML IJ SOLN
0.0000 [IU] | Freq: Every day | INTRAMUSCULAR | Status: DC
  Administered 2021-11-14 – 2021-11-15 (×2): 2 [IU] via SUBCUTANEOUS

## 2021-11-14 MED ORDER — FERROUS SULFATE 325 (65 FE) MG PO TABS
325.0000 mg | ORAL_TABLET | Freq: Two times a day (BID) | ORAL | Status: DC
Start: 2021-11-14 — End: 2021-11-19
  Administered 2021-11-14 – 2021-11-18 (×8): 325 mg via ORAL
  Filled 2021-11-14 (×10): qty 1

## 2021-11-14 MED ORDER — SODIUM CHLORIDE 0.9 % IV SOLN: 250.0000 mL | INTRAVENOUS | Status: DC | PRN

## 2021-11-14 MED ORDER — DILTIAZEM LOAD VIA INFUSION
15.0000 mg | Freq: Once | INTRAVENOUS | Status: AC
  Administered 2021-11-14: 15 mg via INTRAVENOUS
  Filled 2021-11-14: qty 15

## 2021-11-14 MED ORDER — PHENAZOPYRIDINE HCL 100 MG PO TABS
200.0000 mg | ORAL_TABLET | Freq: Once | ORAL | Status: AC
  Administered 2021-11-14: 200 mg via ORAL
  Filled 2021-11-14: qty 2

## 2021-11-14 MED ORDER — SODIUM CHLORIDE 0.9% FLUSH
3.0000 mL | Freq: Two times a day (BID) | INTRAVENOUS | Status: DC
  Administered 2021-11-14 – 2021-11-18 (×7): 3 mL via INTRAVENOUS

## 2021-11-14 MED ORDER — DILTIAZEM HCL-DEXTROSE 125-5 MG/125ML-% IV SOLN (PREMIX)
5.0000 mg/h | INTRAVENOUS | Status: DC
  Administered 2021-11-14: 5 mg/h via INTRAVENOUS
  Filled 2021-11-14: qty 125

## 2021-11-14 MED ORDER — APIXABAN 5 MG PO TABS
5.0000 mg | ORAL_TABLET | Freq: Two times a day (BID) | ORAL | Status: DC
  Administered 2021-11-14 – 2021-11-18 (×8): 5 mg via ORAL
  Filled 2021-11-14 (×8): qty 1

## 2021-11-14 NOTE — ED Triage Notes (Signed)
Pt to the ED with chest tightness and discomfort.  Pt has a history of afib with cardioversion.  Pt has a current UTI and is on antibiotics.

## 2021-11-14 NOTE — H&P (Signed)
History and Physical    Stephen Roberts GMW:[id number] DOB: February 26, 1966 DOA: 11/14/2021  PCP: Lindell Spar, MD   Patient coming from: Home  Chief Complaint: Chest pain/dyspnea  HPI: Stephen Roberts is a 56 y.o. male with medical history significant for atrial fibrillation on Eliquis, type 2 diabetes, iron deficiency anemia, hypertension, OSA, tobacco abuse, GERD, dyslipidemia, and obesity who was recently diagnosed with UTI at urgent care facility.  Patient presented to the ED with chest tightness, shortness of breath, and lightheadedness.  He was diagnosed with UTI yesterday and received a Rocephin IM shot and was given a prescription for Bactrim which he had not yet filled.  He was also recently started on Farxiga by cardiology and was instructed to stop this medication for now as this would trigger UTIs.  He still has ongoing symptoms of UTI with dysuria and over the last 2 days has been having worsening chest tightness.  Heart rate was noted to be quite elevated on Holter monitoring and he was instructed to come to the ED due to his symptoms.    ED Course: Vital signs with some initial tachyarrhythmia with atrial fibrillation/RVR noted as well as fever with temperature 100.4 F and tachypnea noted as well.  Patient noted to have gram-negative rod UTI.  Chest x-ray negative.  He has been given a dose of oral diltiazem with improvement in heart rates noted.  Review of Systems: Reviewed as noted above, otherwise negative.  Past Medical History:  Diagnosis Date   A-fib ([place])    Arthritis    Diabetes ([facility])    GERD (gastroesophageal reflux disease)    Heart murmur    Pneumonia 06/27/2006    Past Surgical History:  Procedure Laterality Date   ANTERIOR FUSION CERVICAL SPINE     CARDIOVERSION N/A 10/05/2021   Procedure: CARDIOVERSION;  Surgeon: Sanda Klein, MD;  Location: [place];  Service: Cardiovascular;  Laterality: N/A;   COLONOSCOPY WITH PROPOFOL N/A 03/26/2020   Procedure:  COLONOSCOPY WITH PROPOFOL;  Surgeon: Daneil Dolin, MD;  [facility];  Service: Endoscopy;  Laterality: N/A;   disc in neck Left 05/2017   ESOPHAGEAL DILATION N/A 03/25/2020   Procedure: ESOPHAGEAL DILATION;  Surgeon: Rogene Houston, MD;  [facility];  Service: Endoscopy;  Laterality: N/A;   ESOPHAGOGASTRODUODENOSCOPY (EGD) WITH PROPOFOL N/A 03/25/2020   Procedure: ESOPHAGOGASTRODUODENOSCOPY (EGD) WITH PROPOFOL;  Surgeon: Rogene Houston, MD;  [facility];  Service: Endoscopy;  Laterality: N/A;   GIVENS CAPSULE STUDY N/A 04/13/2020   Procedure: GIVENS CAPSULE STUDY;  Surgeon: Daneil Dolin, MD;  [facility];  Service: Endoscopy;  Laterality: N/A;  7:30am   INSERTION OF MESH N/A 01/23/2013   Procedure: INSERTION OF MESH;  Surgeon: Adin Hector, MD;  [facility];  Service: General;  Laterality: N/A;   LEFT HEART CATH AND CORONARY ANGIOGRAPHY N/A 08/27/2021   Procedure: LEFT HEART CATH AND CORONARY ANGIOGRAPHY;  Surgeon: Martinique, Peter M, MD;  Location: [facility];  Service: Cardiovascular;  Laterality: N/A;   POLYPECTOMY  03/26/2020   Procedure: POLYPECTOMY;  Surgeon: Daneil Dolin, MD;  [facility];  Service: Endoscopy;;   TONSILLECTOMY     as child   VENTRAL HERNIA REPAIR N/A 01/23/2013   Procedure: LAPAROSCOPIC MULTIPLE VENTRAL HERNIAS;  Surgeon: Adin Hector, MD;  [facility];  Service: General;  Laterality: N/A;     reports that he has been smoking cigarettes. He  started smoking about 43 years ago. He has a 21.00 pack-year smoking history. He has never used smokeless tobacco. He reports current alcohol use. He reports that he does not currently use drugs after having used the following drugs: Cocaine.  Allergies  Allergen Reactions   Gabapentin Shortness Of Breath   Lyrica [Pregabalin] Shortness Of Breath   Crestor [Rosuvastatin] Other (See Comments)    myalgia   Farxiga [Dapagliflozin]      Developed UTI    Family History  Problem Relation Age of Onset   Depression Mother    Hearing loss Mother    Hyperlipidemia Mother    Hypertension Mother    Stroke Mother    Early death Father    Diabetes Brother    Pancreatic cancer Paternal Uncle    Colon cancer Neg Hx     Prior to Admission medications   Medication Sig Start Date End Date Taking? Authorizing Provider  albuterol (VENTOLIN HFA) 108 (90 Base) MCG/ACT inhaler INHALE 2 PUFFS INTO THE LUNGS EVERY 6 HOURS AS NEEDED FOR WHEEZING OR SHORTNESS OF BREATH 09/08/20   Susy Frizzle, MD  apixaban (ELIQUIS) 5 MG TABS tablet Take 1 tablet (5 mg total) by mouth 2 (two) times daily. 09/14/21   Loel Dubonnet, NP  Ascorbic Acid (VITAMIN C PO) Take 500 mg by mouth daily.    [provider]  dapagliflozin propanediol (FARXIGA) 10 MG TABS tablet Take 1 tablet (10 mg total) by mouth daily before breakfast. 10/26/21   Loel Dubonnet, NP  esomeprazole (NEXIUM) 40 MG capsule Take 1 capsule (40 mg total) by mouth daily. 09/01/21   Lindell Spar, MD  ferrous sulfate 325 (65 FE) MG EC tablet Take 1 tablet (325 mg total) by mouth 2 (two) times daily. 08/30/21 08/30/22  Lindell Spar, MD  furosemide (LASIX) 20 MG tablet Take 1 tablet (20 mg total) by mouth daily. 09/16/21 09/11/22  Loel Dubonnet, NP  Lactobacillus Rhamnosus, GG, (PROBIOTIC COLIC) LIQD Take by mouth.    [provider]  losartan (COZAAR) 25 MG tablet Take 1 tablet (25 mg total) by mouth daily. 09/14/21 03/13/22  Loel Dubonnet, NP  metFORMIN (GLUCOPHAGE) 500 MG tablet Take 1 tablet (500 mg total) by mouth 2 (two) times daily with a meal. Patient taking differently: Take 500 mg by mouth daily with breakfast. 09/01/21   Lindell Spar, MD  metoprolol succinate (TOPROL-XL) 50 MG 24 hr tablet Take 1.5 tablets (75 mg total) by mouth daily. 11/09/21 12/09/21  Noemi Chapel, MD  Multiple Vitamin (MULTIVITAMIN) tablet Take 1 tablet by mouth daily.    [provider]  oxyCODONE-acetaminophen (PERCOCET/ROXICET) 5-325 MG tablet Take 1 tablet by mouth every 8 (eight) hours as needed for severe pain.    [provider]  potassium chloride SA (KLOR-CON M) 10 MEQ tablet Take 1 tablet (10 mEq total) by mouth daily. 09/16/21 09/11/22  Loel Dubonnet, NP  pravastatin (PRAVACHOL) 20 MG tablet Take one tablet three times per week. 10/26/21   Loel Dubonnet, NP  sulfamethoxazole-trimethoprim (BACTRIM DS) 800-160 MG tablet Take 1 tablet by mouth 2 (two) times daily. 11/13/21   Volney American, PA-C  Turmeric 500 MG CAPS Take 500 mg by mouth daily.    [provider]  vitamin B-12 (CYANOCOBALAMIN) 1000 MCG tablet Take 1,000 mcg by mouth daily.    [provider]    Physical Exam: Vitals:   11/14/21 1245 11/14/21 1315 11/14/21 1323 11/14/21 1345  BP:  (!) 90/57  100/74  Pulse: 77 63  65  Resp: 16 (!) 32  (!) 21  Temp:   98.9 F (37.2 C)   TempSrc:   Oral   SpO2: 98% 96%  97%  Weight:      Height:        Constitutional: NAD, calm, comfortable Vitals:   11/14/21 1245 11/14/21 1315 11/14/21 1323 11/14/21 1345  BP:  (!) 90/57  100/74  Pulse: 77 63  65  Resp: 16 (!) 32  (!) 21  Temp:   98.9 F (37.2 C)   TempSrc:   Oral   SpO2: 98% 96%  97%  Weight:      Height:       Eyes: lids and conjunctivae normal Neck: normal, supple Respiratory: clear to auscultation bilaterally. Normal respiratory effort. No accessory muscle use.  Cardiovascular: Regular rate and rhythm, no murmurs. Abdomen: no tenderness, no distention. Bowel sounds positive.  Musculoskeletal:  No edema. Skin: no rashes, lesions, ulcers.  Psychiatric: Flat affect  Labs on Admission: I have personally reviewed following labs and imaging studies  CBC: Recent Labs  Lab 11/09/21 1505 11/13/21 1252 11/14/21 0935  WBC 7.2 15.3* 9.2  NEUTROABS  --  12.1*  --   HGB 14.9 15.0 13.7  HCT 45.2 43.4 41.1  MCV 93.2 91 92.8  PLT 155 129* 106*    Basic Metabolic Panel: Recent Labs  Lab 11/09/21 1505 11/13/21 1252 11/14/21 0935 11/14/21 0947  NA 139 137 135  --   K 4.3 4.4 3.5  --   CL 105 98 103  --   CO2 25 19* 23  --   GLUCOSE 152* 203* 260*  --   BUN 29* 14 19  --   CREATININE 1.06 1.07 0.92  --   CALCIUM 9.3 9.2 8.7*  --   MG  --   --   --  1.8   GFR: Estimated Creatinine Clearance: 103 mL/min (by C-G formula based on SCr of 0.92 mg/dL). Liver Function Tests: No results for input(s): "AST", "ALT", "ALKPHOS", "BILITOT", "PROT", "ALBUMIN" in the last 168 hours. No results for input(s): "LIPASE", "AMYLASE" in the last 168 hours. No results for input(s): "AMMONIA" in the last 168 hours. Coagulation Profile: Recent Labs  Lab 11/14/21 1149  INR 1.3*   Cardiac Enzymes: No results for input(s): "CKTOTAL", "CKMB", "CKMBINDEX", "TROPONINI" in the last 168 hours. BNP (last 3 results) No results for input(s): "PROBNP" in the last 8760 hours. HbA1C: No results for input(s): "HGBA1C" in the last 72 hours. CBG: No results for input(s): "GLUCAP" in the last 168 hours. Lipid Profile: No results for input(s): "CHOL", "HDL", "LDLCALC", "TRIG", "CHOLHDL", "LDLDIRECT" in the last 72 hours. Thyroid Function Tests: No results for input(s): "TSH", "T4TOTAL", "FREET4", "T3FREE", "THYROIDAB" in the last 72 hours. Anemia Panel: No results for input(s): "VITAMINB12", "FOLATE", "FERRITIN", "TIBC", "IRON", "RETICCTPCT" in the last 72 hours. Urine analysis:    Component Value Date/Time   COLORURINE YELLOW 01/26/2013 0651   APPEARANCEUR CLEAR 01/26/2013 0651   LABSPEC 1.017 01/26/2013 0651   PHURINE 7.0 01/26/2013 0651   GLUCOSEU NEGATIVE 01/26/2013 0651   HGBUR NEGATIVE 01/26/2013 0651   BILIRUBINUR negative 11/13/2021 1212   KETONESUR negative 11/13/2021 1212   KETONESUR NEGATIVE 01/26/2013 0651   PROTEINUR =30 (A) 11/13/2021 1212   PROTEINUR NEGATIVE 01/26/2013 0651   UROBILINOGEN 0.2 11/13/2021 1212   UROBILINOGEN 0.2  01/26/2013 0651   NITRITE Positive (A) 11/13/2021 1212   NITRITE NEGATIVE  01/26/2013 0651   LEUKOCYTESUR Trace (A) 11/13/2021 1212    Radiological Exams on Admission: DG Chest 2 View  Result Date: 11/14/2021 CLINICAL DATA:  Chest pain with increased heart rate and chest tightness. Possible atrial fibrillation. History of diabetes. EXAM: CHEST - 2 VIEW COMPARISON:  Radiographs 11/09/2021 and 10/19/2021. FINDINGS: The heart size and mediastinal contours are stable with a moderate size hiatal hernia. The lungs are clear. There is no pleural effusion or pneumothorax. No acute osseous findings. Previous cervical fusion. Telemetry leads overlie the chest. IMPRESSION: Stable chest.  No active cardiopulmonary process. Electronically Signed   By: Richardean Sale M.D.   On: 11/14/2021 10:22    EKG: Independently reviewed. Afib 132bpm.  Assessment/Plan Principal Problem:   Atrial fibrillation with RVR  Active Problems:   Type 2 diabetes mellitus with other specified complication ([facility])   GERD (gastroesophageal reflux disease)   Essential hypertension   Iron deficiency anemia   Persistent atrial fibrillation ([facility])   Sepsis secondary to UTI ([place])    Sepsis, present on admission secondary to gram-negative rod UTI -Noted tachycardia, tachypnea, and fever -Continue to follow for ID and sensitivity -Maintain on Rocephin empirically for now  Symptomatic atrial fibrillation with RVR -Likely being aggravated by UTI -Continue to monitor on telemetry and consider cardiology consultation by a.m. if unimproved and may need to consider cardioversion versus EP referral in the near future -Increase Toprol XL dose to 75 mg daily as recommended by cardiology 6/27 -Monitor on telemetry  Atrial fibrillation on chronic anticoagulation -Continue Eliquis for anticoagulation -Continue Toprol XL at 75 mg daily as recommended by cardiology 6/27  Iron deficiency anemia -Follow CBC -Outpatient hematology follow-up  for iron infusions  Type 2 diabetes -Instructed to discontinue [place] given UTI -Continue SSI  Hypertension -Continue losartan and Toprol  Dyslipidemia -Continue pravastatin  Tobacco abuse -States that he has quit  Alcohol abuse -Counseled on cessation -States that he has not had any significant alcohol use in the last 1 week  GERD -PPI  Morbid obesity/OSA -BMI 36.70, lifestyle changes outpatient -CPAP at night   DVT prophylaxis: Eliquis Code Status: Full Family Communication: Spouse at bedside Disposition Plan:Admit for tx of UTI and Afib/RVR Consults called:Cardiology in AM Admission status: Obs, Tele  Severity of Illness: The appropriate patient status for this patient is OBSERVATION. Observation status is judged to be reasonable and necessary in order to provide the required intensity of service to ensure the patient's safety. The patient's presenting symptoms, physical exam findings, and initial radiographic and laboratory data in the context of their medical condition is felt to place them at decreased risk for further clinical deterioration. Furthermore, it is anticipated that the patient will be medically stable for discharge from the hospital within 2 midnights of admission.    Lerae Langham D Manuella Ghazi DO [facility]  If 7PM-7AM, please contact night-coverage [email or web address]  11/14/2021, 2:50 PM

## 2021-11-14 NOTE — ED Provider Notes (Signed)
[facility] EMERGENCY DEPARTMENT Provider Note   CSN: [id number] Arrival date & time: 11/14/21  1601     History Chief Complaint  Patient presents with   Chest Pain    Stephen Roberts is a 56 y.o. male with history of iron deficiency anemia followed by Dr. Delton Coombes on oncology for frequent iron infusions, atrial fibrillation on Eliquis, diabetes who presents to the emergency department today with chest tightness, shortness of breath, and lightheadedness.  Patient is currently being treated for a UTI which was diagnosed yesterday at urgent care.  He was given ceftriaxone IM and Bactrim which she has not yet filled.  He was recently started on Farxiga for diabetes and was instructed by his cardiologist that this could possibly trigger UTIs.  Patient still having dysuria.  Really over the last couple of days has been having general malaise and started having chest tightness this morning and upon taking the mobile telemetry on their phone noticed he was in atrial fibrillation.  He describes shortness of breath which is worse with minimal exertion which brings on lightheadedness.  He has not lost consciousness.  Of note, patient was seen and evaluated for atrial fibrillation on 11/09/2021 here in the emergency department.  Upon further chart review he had a normal left heart catheterization on April 2023 and echocardiogram in April which showed ejection fraction of 45 to 50%.  They did not cardiovert him at that time because he states that he missed a dose of Eliquis.  Since that visit, patient has not missed any doses of Eliquis.   Chest Pain      Home Medications Prior to Admission medications   Medication Sig Start Date End Date Taking? Authorizing Provider  albuterol (VENTOLIN HFA) 108 (90 Base) MCG/ACT inhaler INHALE 2 PUFFS INTO THE LUNGS EVERY 6 HOURS AS NEEDED FOR WHEEZING OR SHORTNESS OF BREATH 09/08/20   Susy Frizzle, MD  apixaban (ELIQUIS) 5 MG TABS tablet Take 1 tablet (5 mg  total) by mouth 2 (two) times daily. 09/14/21   Loel Dubonnet, NP  Ascorbic Acid (VITAMIN C PO) Take 500 mg by mouth daily.    [provider]  dapagliflozin propanediol (FARXIGA) 10 MG TABS tablet Take 1 tablet (10 mg total) by mouth daily before breakfast. 10/26/21   Loel Dubonnet, NP  esomeprazole (NEXIUM) 40 MG capsule Take 1 capsule (40 mg total) by mouth daily. 09/01/21   Lindell Spar, MD  ferrous sulfate 325 (65 FE) MG EC tablet Take 1 tablet (325 mg total) by mouth 2 (two) times daily. 08/30/21 08/30/22  Lindell Spar, MD  furosemide (LASIX) 20 MG tablet Take 1 tablet (20 mg total) by mouth daily. 09/16/21 09/11/22  Loel Dubonnet, NP  Lactobacillus Rhamnosus, GG, (PROBIOTIC COLIC) LIQD Take by mouth.    [provider]  losartan (COZAAR) 25 MG tablet Take 1 tablet (25 mg total) by mouth daily. 09/14/21 03/13/22  Loel Dubonnet, NP  metFORMIN (GLUCOPHAGE) 500 MG tablet Take 1 tablet (500 mg total) by mouth 2 (two) times daily with a meal. Patient taking differently: Take 500 mg by mouth daily with breakfast. 09/01/21   Lindell Spar, MD  metoprolol succinate (TOPROL-XL) 50 MG 24 hr tablet Take 1.5 tablets (75 mg total) by mouth daily. 11/09/21 12/09/21  Noemi Chapel, MD  Multiple Vitamin (MULTIVITAMIN) tablet Take 1 tablet by mouth daily.    [provider]  oxyCODONE-acetaminophen (PERCOCET/ROXICET) 5-325 MG tablet Take 1 tablet by mouth every  8 (eight) hours as needed for severe pain.    [provider]  potassium chloride SA (KLOR-CON M) 10 MEQ tablet Take 1 tablet (10 mEq total) by mouth daily. 09/16/21 09/11/22  Loel Dubonnet, NP  pravastatin (PRAVACHOL) 20 MG tablet Take one tablet three times per week. 10/26/21   Loel Dubonnet, NP  sulfamethoxazole-trimethoprim (BACTRIM DS) 800-160 MG tablet Take 1 tablet by mouth 2 (two) times daily. 11/13/21   Volney American, PA-C  Turmeric 500 MG CAPS Take 500 mg by mouth daily.    [provider]  vitamin B-12 (CYANOCOBALAMIN) 1000 MCG tablet Take 1,000 mcg by mouth daily.    [provider]      Allergies    Gabapentin, Lyrica [pregabalin], Crestor [rosuvastatin], and Farxiga [dapagliflozin]    Review of Systems   Review of Systems  Cardiovascular:  Positive for chest pain.  All other systems reviewed and are negative.   Physical Exam Updated Vital Signs BP 100/74   Pulse 65   Temp 98.9 F (37.2 C) (Oral)   Resp (!) 21   Ht '5\' 7"'$  (1.702 m)   Wt 106.3 kg   SpO2 97%   BMI 36.70 kg/m  Physical Exam Vitals and nursing note reviewed.  Constitutional:      General: He is not in acute distress.    Appearance: Normal appearance.  HENT:     Head: Normocephalic and atraumatic.  Eyes:     General:        Right eye: No discharge.        Left eye: No discharge.  Cardiovascular:     Rate and Rhythm: Tachycardia present. Rhythm irregular.     Comments: S1/S2 are distinct without any evidence of murmur, rubs, or gallops.  Radial pulses are 2+ bilaterally.  Dorsalis pedis pulses are 2+ bilaterally.  No evidence of pedal edema. Pulmonary:     Comments: Clear to auscultation bilaterally.  Normal effort.  No respiratory distress.  No evidence of wheezes, rales, or rhonchi heard throughout. Abdominal:     General: Abdomen is flat. Bowel sounds are normal. There is no distension.     Tenderness: There is no abdominal tenderness. There is no guarding or rebound.  Musculoskeletal:        General: Normal range of motion.     Cervical back: Neck supple.  Skin:    General: Skin is warm and dry.     Findings: No rash.  Neurological:     General: No focal deficit present.     Mental Status: He is alert.  Psychiatric:        Mood and Affect: Mood normal.        Behavior: Behavior normal.     ED Results / Procedures / Treatments   Labs (all labs ordered are listed, but only abnormal results are displayed) Labs Reviewed  BASIC METABOLIC PANEL -  Abnormal; Notable for the following components:      Result Value   Glucose, Bld 260 (*)    Calcium 8.7 (*)    All other components within normal limits  CBC - Abnormal; Notable for the following components:   RDW 17.5 (*)    Platelets 106 (*)    All other components within normal limits  PROTIME-INR - Abnormal; Notable for the following components:   Prothrombin Time 16.4 (*)    INR 1.3 (*)    All other components within normal limits  CULTURE, BLOOD (ROUTINE X 2)  CULTURE, BLOOD (ROUTINE X 2)  MAGNESIUM  LACTIC ACID, PLASMA  LACTIC ACID, PLASMA  APTT  TROPONIN I (HIGH SENSITIVITY)  TROPONIN I (HIGH SENSITIVITY)    EKG EKG Interpretation  Date/Time:  Sunday November 14 2021 09:36:06 EDT Ventricular Rate:  132 PR Interval:    QRS Duration: 109 QT Interval:  342 QTC Calculation: 412 R Axis:   -52 Text Interpretation: Atrial fibrillation Incomplete left bundle branch block No acute changes No significant change since last tracing Confirmed by Varney Biles [phone]) on 11/14/2021 10:57:13 AM  Radiology DG Chest 2 View  Result Date: 11/14/2021 CLINICAL DATA:  Chest pain with increased heart rate and chest tightness. Possible atrial fibrillation. History of diabetes. EXAM: CHEST - 2 VIEW COMPARISON:  Radiographs 11/09/2021 and 10/19/2021. FINDINGS: The heart size and mediastinal contours are stable with a moderate size hiatal hernia. The lungs are clear. There is no pleural effusion or pneumothorax. No acute osseous findings. Previous cervical fusion. Telemetry leads overlie the chest. IMPRESSION: Stable chest.  No active cardiopulmonary process. Electronically Signed   By: Richardean Sale M.D.   On: 11/14/2021 10:22    Procedures .Critical Care  Performed by: Hendricks Limes, PA-C Authorized by: Hendricks Limes, PA-C   Critical care provider statement:    Critical care time (minutes):  35   Critical care time was exclusive of:  Separately billable procedures and treating  other patients and teaching time   Critical care was necessary to treat or prevent imminent or life-threatening deterioration of the following conditions:  Sepsis and cardiac failure   Critical care was time spent personally by me on the following activities:  Blood draw for specimens, development of treatment plan with patient or surrogate and discussions with consultants   Care discussed with: admitting provider       Medications Ordered in ED Medications  phenazopyridine (PYRIDIUM) tablet 200 mg (has no administration in time range)  diltiazem (CARDIZEM) 1 mg/mL load via infusion 15 mg (15 mg Intravenous Bolus from Bag 11/14/21 1154)  diltiazem (CARDIZEM) tablet 30 mg (30 mg Oral Given 11/14/21 1410)    ED Course/ Medical Decision Making/ A&P Clinical Course as of 11/14/21 1420  Sun Nov 14, 2021  1418 I spoke with Dr. Manuella Ghazi with [facility] hospitalist who agrees to admit the patient. [CF]  1419 CBC(!) Normal [CF]  8466 Basic metabolic panel(!) Elevated glucose but otherwise no findings. [CF]  1419 Protime-INR(!) Prolonged. [CF]  1419 Troponin I (High Sensitivity) Initial and delta troponin are negative. [CF]  1419 APTT Normal. [CF]  1419 Magnesium Normal. [CF]  1419 Blood Culture (routine x 2) Blood cultures are obtained and pending. [CF]  1419 Lactic acid, plasma Normal. [CF]    Clinical Course User Index [CF] Hendricks Limes, PA-C                           Medical Decision Making Stephen Roberts is a 56 y.o. male patient who presents to the emergency department today with chest tightness, shortness of breath, dizziness patient found to be in atrial fibrillation on the monitor with rapid ventricular response.  Patient is primarily symptomatic when he is up moving around.  We will get sepsis labs considering that he had a leukocytosis yesterday on chart review and a positive UTI.   Amount and/or Complexity of Data Reviewed Labs: ordered. Radiology:  ordered.  Risk Prescription drug management. Parenteral controlled substances. Decision regarding hospitalization. Risk Details: Patient was  placed on diltiazem drip secondary to atrial fibrillation with rapid ventricular response.  His leukocytosis has improved from yesterday but concerned that he is still profoundly symptomatic I do feel that the patient would likely benefit from further evaluation with IV antibiotics and further management of his heart rate.  Patient had a good response with diltiazem load and after some time with the diltiazem drip I took him off maintenance drip and put him on p.o. diltiazem.  Going to work on getting him admitted to the hospitalist service.  His heart rate has improved down to between 90 and 100 bpm.  Patient amenable this plan.   Final Clinical Impression(s) / ED Diagnoses Final diagnoses:  Atrial fibrillation with RVR ([place])  Acute cystitis without hematuria    Rx / DC Orders ED Discharge Orders     None         Hendricks Limes, [place] 11/14/21 [place], MD 11/15/21 [phone]

## 2021-11-14 NOTE — Telephone Encounter (Signed)
Patient called in reporting a HR of 170 bpm this morning. Recently started on farxiga. Yesterday presented to [facility] and dx with UTI. Given rocephin IM, along with Rx for oral meds. This morning reports his HR has been significantly elevated in the 170s. He feels terrible, weak, lightheaded. Reports wife is driving him [employer] now. Advised this was the correct action. Instructed to stop his farxiga which he has done. Will list in chart under allergies for future reference. He thanked me for callback.

## 2021-11-15 DIAGNOSIS — Z7901 Long term (current) use of anticoagulants: Secondary | ICD-10-CM | POA: Diagnosis not present

## 2021-11-15 DIAGNOSIS — I4819 Other persistent atrial fibrillation: Secondary | ICD-10-CM | POA: Diagnosis present

## 2021-11-15 DIAGNOSIS — Z981 Arthrodesis status: Secondary | ICD-10-CM | POA: Diagnosis not present

## 2021-11-15 DIAGNOSIS — G4733 Obstructive sleep apnea (adult) (pediatric): Secondary | ICD-10-CM | POA: Diagnosis present

## 2021-11-15 DIAGNOSIS — M1990 Unspecified osteoarthritis, unspecified site: Secondary | ICD-10-CM | POA: Diagnosis present

## 2021-11-15 DIAGNOSIS — J449 Chronic obstructive pulmonary disease, unspecified: Secondary | ICD-10-CM | POA: Diagnosis present

## 2021-11-15 DIAGNOSIS — I4891 Unspecified atrial fibrillation: Secondary | ICD-10-CM | POA: Diagnosis not present

## 2021-11-15 DIAGNOSIS — K219 Gastro-esophageal reflux disease without esophagitis: Secondary | ICD-10-CM | POA: Diagnosis present

## 2021-11-15 DIAGNOSIS — D509 Iron deficiency anemia, unspecified: Secondary | ICD-10-CM | POA: Diagnosis present

## 2021-11-15 DIAGNOSIS — E785 Hyperlipidemia, unspecified: Secondary | ICD-10-CM | POA: Diagnosis present

## 2021-11-15 DIAGNOSIS — I358 Other nonrheumatic aortic valve disorders: Secondary | ICD-10-CM | POA: Diagnosis present

## 2021-11-15 DIAGNOSIS — Z833 Family history of diabetes mellitus: Secondary | ICD-10-CM | POA: Diagnosis not present

## 2021-11-15 DIAGNOSIS — G894 Chronic pain syndrome: Secondary | ICD-10-CM | POA: Diagnosis present

## 2021-11-15 DIAGNOSIS — Z83438 Family history of other disorder of lipoprotein metabolism and other lipidemia: Secondary | ICD-10-CM | POA: Diagnosis not present

## 2021-11-15 DIAGNOSIS — E669 Obesity, unspecified: Secondary | ICD-10-CM | POA: Diagnosis present

## 2021-11-15 DIAGNOSIS — A4151 Sepsis due to Escherichia coli [E. coli]: Secondary | ICD-10-CM | POA: Diagnosis present

## 2021-11-15 DIAGNOSIS — E119 Type 2 diabetes mellitus without complications: Secondary | ICD-10-CM | POA: Diagnosis not present

## 2021-11-15 DIAGNOSIS — I110 Hypertensive heart disease with heart failure: Secondary | ICD-10-CM | POA: Diagnosis present

## 2021-11-15 DIAGNOSIS — I493 Ventricular premature depolarization: Secondary | ICD-10-CM

## 2021-11-15 DIAGNOSIS — F1010 Alcohol abuse, uncomplicated: Secondary | ICD-10-CM | POA: Diagnosis present

## 2021-11-15 DIAGNOSIS — Z8249 Family history of ischemic heart disease and other diseases of the circulatory system: Secondary | ICD-10-CM | POA: Diagnosis not present

## 2021-11-15 DIAGNOSIS — N3000 Acute cystitis without hematuria: Secondary | ICD-10-CM | POA: Diagnosis present

## 2021-11-15 DIAGNOSIS — E1169 Type 2 diabetes mellitus with other specified complication: Secondary | ICD-10-CM | POA: Diagnosis present

## 2021-11-15 DIAGNOSIS — I428 Other cardiomyopathies: Secondary | ICD-10-CM | POA: Diagnosis present

## 2021-11-15 DIAGNOSIS — J309 Allergic rhinitis, unspecified: Secondary | ICD-10-CM | POA: Diagnosis present

## 2021-11-15 DIAGNOSIS — I5022 Chronic systolic (congestive) heart failure: Secondary | ICD-10-CM | POA: Diagnosis present

## 2021-11-15 LAB — CBC
HCT: 40.4 % (ref 39.0–52.0)
Hemoglobin: 13.1 g/dL (ref 13.0–17.0)
MCH: 30.6 pg (ref 26.0–34.0)
MCHC: 32.4 g/dL (ref 30.0–36.0)
MCV: 94.4 fL (ref 80.0–100.0)
Platelets: 114 10*3/uL — ABNORMAL LOW (ref 150–400)
RBC: 4.28 MIL/uL (ref 4.22–5.81)
RDW: 17.4 % — ABNORMAL HIGH (ref 11.5–15.5)
WBC: 5.5 10*3/uL (ref 4.0–10.5)
nRBC: 0 % (ref 0.0–0.2)

## 2021-11-15 LAB — MAGNESIUM: Magnesium: 1.9 mg/dL (ref 1.7–2.4)

## 2021-11-15 LAB — BASIC METABOLIC PANEL
Anion gap: 6 (ref 5–15)
BUN: 15 mg/dL (ref 6–20)
CO2: 24 mmol/L (ref 22–32)
Calcium: 8.6 mg/dL — ABNORMAL LOW (ref 8.9–10.3)
Chloride: 105 mmol/L (ref 98–111)
Creatinine, Ser: 0.58 mg/dL — ABNORMAL LOW (ref 0.61–1.24)
GFR, Estimated: >60 mL/min (ref >60)
Glucose, Bld: 154 mg/dL — ABNORMAL HIGH (ref 70–99)
Potassium: 3.8 mmol/L (ref 3.5–5.1)
Sodium: 135 mmol/L (ref 135–145)

## 2021-11-15 LAB — URINE CULTURE: Culture: >=100000 — AB

## 2021-11-15 LAB — GLUCOSE, CAPILLARY
Glucose-Capillary: 174 mg/dL — ABNORMAL HIGH (ref 70–99)
Glucose-Capillary: 209 mg/dL — ABNORMAL HIGH (ref 70–99)
Glucose-Capillary: 142 mg/dL — ABNORMAL HIGH (ref 70–99)
Glucose-Capillary: 231 mg/dL — ABNORMAL HIGH (ref 70–99)

## 2021-11-15 LAB — TROPONIN I (HIGH SENSITIVITY)
Troponin I (High Sensitivity): 5 ng/L (ref <18)
Troponin I (High Sensitivity): 5 ng/L (ref <18)

## 2021-11-15 NOTE — Progress Notes (Signed)
Patient alert and oriented x4, VSS, x2 20g Ivs right AC and left hand saline locked. Writer called report to nurse Delbert Harness, RN receiving nurse and verbalized understanding with report provided. Patient going to room 2C13. Patient has his belongings in hand. Patient transferred via EMS carelink.

## 2021-11-15 NOTE — Consult Note (Addendum)
Cardiology Consultation:   Patient ID: Stephen Roberts MRN: [id number]; DOB: 08-12-1965  Admit date: 11/14/2021 Date of Consult: 11/15/2021  PCP:  Lindell Spar, MD   [facility] Providers Cardiologist:  Werner Lean, MD        Patient Profile:   Stephen Roberts is a 56 y.o. male with a hx of paroxysmal atrial fibrillation, HFmrEF (EF 45-50% in 08/2021 with cath showing no significant CAD), sleep disordered breathing, PVC's (16% burden by recent monitor), HTN, HLD, Type 2 DM, iron deficiency anemia, prior alcohol use and tobacco use who is being seen 11/15/2021 for the evaluation of atrial fibrillation with RVR at the request of Dr. Manuella Ghazi.  History of Present Illness:   Stephen Roberts was admitted to [facility] in 08/2021 for atrial fibrillation with RVR and due to reported chest pain, underwent a cardiac catheterization at that time which showed only minimal luminal irregularities. He did have mildly reduced EF of 45 to 50% and was unclear if this was related to his atrial fibrillation or frequent PVC's.  He was still in atrial fibrillation at time of his follow-up visit on 09/14/2021 and denied missing any Eliquis, therefore DCCV was arranged and performed on 10/05/2021 with conversion to normal sinus rhythm.  He did have a recurrent Emergency Department evaluation on 10/19/2021 and was found to be in atrial fibrillation and underwent successful conversion to normal sinus rhythm.  In the interim, he presented to [facility] ED on 11/09/2021 for evaluation of worsening dyspnea and chest heaviness which occurred while receiving an iron infusion. Found to be back in atrial fibrillation but he did report missing a dose of Eliquis within the past 3 weeks. It was recommended he be referred to EP for consideration of antiarrhythmic therapy and Toprol-XL was also increased from '50mg'$  daily to 75 mg daily.  He presented back to the Emergency Dept yesterday for evaluation of recurrent chest tightness  and dyspnea. He had recently been started on Farxiga for diabetes and reported dysuria, therefore had been started on antibiotic therapy for likely UTI.  Initial labs show WBC 9.2, Hgb 13.7, platelets 106, Na+ 135, K+ 3.5 and creatinine 0.92.  Lactic acid 1.3. Blood cultures pending. CXR showed no active cardiopulmonary disease. EKG showed atrial fibrillation with RVR, HR 132 with PVC's.   He initially received IV Cardizem and this was discontinued and Toprol-XL '75mg'$  was ordered. He has also been started on Rocephin for a UTI.   Due to elevated rates this AM, he received IV Lopressor '5mg'$ . Says that he feels "awful" when in atrial fibrillation and has palpitations, discomfort in his chest and dyspnea on exertion. His rates were peaking into the 150's this morning but have improved into the 90's after receiving Lopressor and he reports improvement in his symptoms. Says that he has not missed any doses of Eliquis for the past 6+ weeks. No reports of active bleeding. No longer consuming alcohol or smoking.   Past Medical History:  Diagnosis Date   A-fib ([place])    Arthritis    Diabetes (HCC)    GERD (gastroesophageal reflux disease)    Heart murmur    Pneumonia 06/27/2006    Past Surgical History:  Procedure Laterality Date   ANTERIOR FUSION CERVICAL SPINE     CARDIOVERSION N/A 10/05/2021   Procedure: CARDIOVERSION;  Surgeon: Sanda Klein, MD;  Location: [place];  Service: Cardiovascular;  Laterality: N/A;   COLONOSCOPY WITH PROPOFOL N/A 03/26/2020   Procedure: COLONOSCOPY WITH PROPOFOL;  Surgeon: Daneil Dolin, MD;  [facility];  Service: Endoscopy;  Laterality: N/A;   disc in neck Left 05/2017   ESOPHAGEAL DILATION N/A 03/25/2020   Procedure: ESOPHAGEAL DILATION;  Surgeon: Rogene Houston, MD;  [facility];  Service: Endoscopy;  Laterality: N/A;   ESOPHAGOGASTRODUODENOSCOPY (EGD) WITH PROPOFOL N/A 03/25/2020   Procedure: ESOPHAGOGASTRODUODENOSCOPY (EGD) WITH  PROPOFOL;  Surgeon: Rogene Houston, MD;  [facility];  Service: Endoscopy;  Laterality: N/A;   GIVENS CAPSULE STUDY N/A 04/13/2020   Procedure: GIVENS CAPSULE STUDY;  Surgeon: Daneil Dolin, MD;  [facility];  Service: Endoscopy;  Laterality: N/A;  7:30am   INSERTION OF MESH N/A 01/23/2013   Procedure: INSERTION OF MESH;  Surgeon: Adin Hector, MD;  Location: [facility];  Service: General;  Laterality: N/A;   LEFT HEART CATH AND CORONARY ANGIOGRAPHY N/A 08/27/2021   Procedure: LEFT HEART CATH AND CORONARY ANGIOGRAPHY;  Surgeon: Martinique, Shabre Kreher M, MD;  Location: [place] [facility];  Service: Cardiovascular;  Laterality: N/A;   POLYPECTOMY  03/26/2020   Procedure: POLYPECTOMY;  Surgeon: Daneil Dolin, MD;  [facility];  Service: Endoscopy;;   TONSILLECTOMY     as child   VENTRAL HERNIA REPAIR N/A 01/23/2013   Procedure: LAPAROSCOPIC MULTIPLE VENTRAL HERNIAS;  Surgeon: Adin Hector, MD;  Location: [facility];  Service: General;  Laterality: N/A;     Home Medications:  Prior to Admission medications   Medication Sig Start Date End Date Taking? Authorizing Provider  acetaminophen (TYLENOL) 650 MG CR tablet Take 1,300 mg by mouth every 8 (eight) hours as needed for pain.   Yes [provider]  albuterol (VENTOLIN HFA) 108 (90 Base) MCG/ACT inhaler INHALE 2 PUFFS INTO THE LUNGS EVERY 6 HOURS AS NEEDED FOR WHEEZING OR SHORTNESS OF BREATH 09/08/20  Yes Susy Frizzle, MD  apixaban (ELIQUIS) 5 MG TABS tablet Take 1 tablet (5 mg total) by mouth 2 (two) times daily. 09/14/21  Yes Loel Dubonnet, NP  Ascorbic Acid (VITAMIN C PO) Take 500 mg by mouth daily.   Yes [provider]  esomeprazole (NEXIUM) 40 MG capsule Take 1 capsule (40 mg total) by mouth daily. 09/01/21  Yes Lindell Spar, MD  ferrous sulfate 325 (65 FE) MG EC tablet Take 1 tablet (325 mg total) by mouth 2 (two) times daily. 08/30/21 08/30/22 Yes Lindell Spar, MD  furosemide (LASIX)  20 MG tablet Take 1 tablet (20 mg total) by mouth daily. 09/16/21 09/11/22 Yes Loel Dubonnet, NP  ibuprofen (ADVIL) 200 MG tablet Take 800 mg by mouth every 6 (six) hours as needed for moderate pain.   Yes [provider]  Lactobacillus Rhamnosus, GG, (PROBIOTIC COLIC) LIQD Take by mouth.   Yes [provider]  losartan (COZAAR) 25 MG tablet Take 1 tablet (25 mg total) by mouth daily. 09/14/21 03/13/22 Yes Loel Dubonnet, NP  metFORMIN (GLUCOPHAGE) 500 MG tablet Take 1 tablet (500 mg total) by mouth 2 (two) times daily with a meal. Patient taking differently: Take 500 mg by mouth daily with breakfast. 09/01/21  Yes Lindell Spar, MD  metoprolol succinate (TOPROL-XL) 50 MG 24 hr tablet Take 1.5 tablets (75 mg total) by mouth daily. 11/09/21 12/09/21 Yes Noemi Chapel, MD  Multiple Vitamin (MULTIVITAMIN) tablet Take 1 tablet by mouth daily.   Yes [provider]  oxyCODONE-acetaminophen (PERCOCET/ROXICET) 5-325 MG tablet Take 1 tablet by mouth every 8 (eight)  hours as needed for severe pain.   Yes [provider]  potassium chloride SA (KLOR-CON M) 10 MEQ tablet Take 1 tablet (10 mEq total) by mouth daily. 09/16/21 09/11/22 Yes Loel Dubonnet, NP  pravastatin (PRAVACHOL) 20 MG tablet Take one tablet three times per week. 10/26/21  Yes Loel Dubonnet, NP  Turmeric 500 MG CAPS Take 500 mg by mouth daily.   Yes [provider]  vitamin B-12 (CYANOCOBALAMIN) 1000 MCG tablet Take 1,000 mcg by mouth daily.   Yes [provider]  dapagliflozin propanediol (FARXIGA) 10 MG TABS tablet Take 1 tablet (10 mg total) by mouth daily before breakfast. Patient not taking: Reported on 11/14/2021 10/26/21   Loel Dubonnet, NP  sulfamethoxazole-trimethoprim (BACTRIM DS) 800-160 MG tablet Take 1 tablet by mouth 2 (two) times daily. 11/13/21   Volney American, PA-C    Inpatient Medications: Scheduled Meds:  apixaban  5 mg Oral BID   ferrous sulfate  325 mg  Oral BID   insulin aspart  0-15 Units Subcutaneous TID WC   insulin aspart  0-5 Units Subcutaneous QHS   metFORMIN  500 mg Oral Q breakfast   metoprolol succinate  75 mg Oral Daily   multivitamin with minerals  1 tablet Oral Daily   pantoprazole  40 mg Oral Daily   pravastatin  20 mg Oral Once per day on Mon Wed Fri   sodium chloride flush  3 mL Intravenous Q12H   vitamin B-12  1,000 mcg Oral Daily   Continuous Infusions:  sodium chloride     cefTRIAXone (ROCEPHIN)  IV 200 mL/hr at 11/14/21 1800   PRN Meds: sodium chloride, acetaminophen **OR** acetaminophen, ibuprofen, metoprolol tartrate, ondansetron **OR** ondansetron (ZOFRAN) IV, oxyCODONE-acetaminophen, sodium chloride flush  Allergies:    Allergies  Allergen Reactions   Gabapentin Shortness Of Breath   Lyrica [Pregabalin] Shortness Of Breath   Crestor [Rosuvastatin] Other (See Comments)    myalgia   Farxiga [Dapagliflozin]     Developed UTI    Social History:   Social History   Socioeconomic History   Marital status: Married    Spouse name: Not on file   Number of children: Not on file   Years of education: Not on file   Highest education level: Not on file  Occupational History   Not on file  Tobacco Use   Smoking status: Every Day    Packs/day: 0.50    Years: 42.00    Total pack years: 21.00    Types: Cigarettes    Start date: 41   Smokeless tobacco: Never  Vaping Use   Vaping Use: Never used  Substance and Sexual Activity   Alcohol use: Yes    Comment: moderately   Drug use: Not Currently    Types: Cocaine    Comment: states he has tried it all. Last used cocaine a few months ago.    Sexual activity: Yes  Other Topics Concern   Not on file  Social History Narrative   Not on file   Social Determinants of Health   Financial Resource Strain: Low Risk  (04/20/2020)   Overall Financial Resource Strain (CARDIA)    Difficulty of Paying Living Expenses: Not hard at all  Food Insecurity: No Food  Insecurity (04/20/2020)   Hunger Vital Sign    Worried About Running Out of Food in the Last Year: Never true    Ran Out of Food in the Last Year: Never true  Transportation Needs: No Transportation  Needs (04/20/2020)   PRAPARE - Hydrologist (Medical): No    Lack of Transportation (Non-Medical): No  Physical Activity: Inactive (04/20/2020)   Exercise Vital Sign    Days of Exercise per Week: 0 days    Minutes of Exercise per Session: 0 min  Stress: Stress Concern Present (04/20/2020)   [place]    Feeling of Stress : Very much  Social Connections: Moderately Isolated (04/20/2020)   Social Connection and Isolation Panel [NHANES]    Frequency of Communication with Friends and Family: More than three times a week    Frequency of Social Gatherings with Friends and Family: More than three times a week    Attends Religious Services: Never    Marine scientist or Organizations: No    Attends Archivist Meetings: Never    Marital Status: Married  Human resources officer Violence: Not At Risk (04/20/2020)   Humiliation, Afraid, Rape, and Kick questionnaire    Fear of Current or Ex-Partner: No    Emotionally Abused: No    Physically Abused: No    Sexually Abused: No    Family History:    Family History  Problem Relation Age of Onset   Depression Mother    Hearing loss Mother    Hyperlipidemia Mother    Hypertension Mother    Stroke Mother    Early death Father    Diabetes Brother    Pancreatic cancer Paternal Uncle    Colon cancer Neg Hx      ROS:  Please see the history of present illness.   All other ROS reviewed and negative.     Physical Exam/Data:   Vitals:   11/15/21 0031 11/15/21 0402 11/15/21 0839 11/15/21 0916  BP: 108/79 116/85 130/90 114/87  Pulse: 74 (!) 40 62 (!) 114  Resp: 18 18    Temp: 98.9 F (37.2 C) 98.1 F (36.7 C)    TempSrc: Oral Oral    SpO2: 97%  98%    Weight:      Height:        Intake/Output Summary (Last 24 hours) at 11/15/2021 0922 Last data filed at 11/15/2021 0400 Gross per 24 hour  Intake 929.5 ml  Output 100 ml  Net 829.5 ml      11/14/2021    3:46 PM 11/14/2021    9:33 AM 11/09/2021    2:37 PM  Last 3 Weights  Weight (lbs) 232 lb 9.4 oz 234 lb 5.6 oz 234 lb 5.6 oz  Weight (kg) 105.5 kg 106.3 kg 106.3 kg     Body mass index is 34.35 kg/m.  General: Pleasant male appearing in no acute distress.  HEENT: normal Neck: no JVD Vascular: No carotid bruits; Distal pulses 2+ bilaterally Cardiac:  normal S1, S2; Irregularly irregular Lungs:  clear to auscultation bilaterally, no wheezing, rhonchi or rales  Abd: soft, nontender, no hepatomegaly  Ext: no pitting edema Musculoskeletal:  No deformities, BUE and BLE strength normal and equal Skin: warm and dry  Neuro:  CNs 2-12 intact, no focal abnormalities noted Psych:  Normal affect   EKG:  The EKG was personally reviewed and demonstrates: Atrial fibrillation with RVR, HR 132 with PVC's.   Telemetry:  Telemetry was personally reviewed and demonstrates: Atrial fibrillation, HR in 90's to low-100's. Peaking into 150's.   Relevant CV Studies:  Echocardiogram: 08/2021 IMPRESSIONS     1. Left ventricular ejection fraction, by estimation, is  45 to 50%. The  left ventricle has mildly decreased function. The left ventricle  demonstrates global hypokinesis. There is mild left ventricular  hypertrophy. Left ventricular diastolic parameters  are indeterminate.   2. Right ventricular systolic function is normal. The right ventricular  size is normal. There is normal pulmonary artery systolic pressure.   3. Left atrial size was severely dilated.   4. Right atrial size was moderately dilated.   5. The mitral valve is normal in structure. Trivial mitral valve  regurgitation. No evidence of mitral stenosis.   6. The aortic valve is tricuspid. Aortic valve regurgitation is not   visualized. Aortic valve sclerosis is present, with no evidence of aortic  valve stenosis.   7. The inferior vena cava is dilated in size with >50% respiratory  variability, suggesting right atrial pressure of 8 mmHg.   Comparison(s): LVEF is slight worse, LA size has increased.   LHC: 17/7939   LV end diastolic pressure is normal.   Mild nonobstructive CAD Normal LVEDP   Plan: medical therapy.  Event Monitor: 10/2021 Two monitors. Patient had a minimum heart rate of 47 bpm, maximum heart rate of 193 bpm, and average heart rate of 83 bpm. Predominant underlying rhythm was sinus rhythym. Paroxsymal SVT, 31 episodes lasting 18 secondsat longest with a max rate of 187 bpm at fastest. NSVT, 9 episodes lasting 4 beats at longest with a max rate of 193 bpm at fastest. Isolated PACs were rare (<1.0%). Isolated PVCs were frequent (16%). Triggered and diary events associated with sinus rhythm, sinus tachycardia, PACs, and PVCs.  Laboratory Data:  High Sensitivity Troponin:   Recent Labs  Lab 11/14/21 0935 11/14/21 0947  TROPONINIHS 7 7     Chemistry Recent Labs  Lab 11/09/21 1505 11/13/21 1252 11/14/21 0935 11/14/21 0947 11/15/21 0523  NA 139 137 135  --  135  K 4.3 4.4 3.5  --  3.8  CL 105 98 103  --  105  CO2 25 19* 23  --  24  GLUCOSE 152* 203* 260*  --  154*  BUN 29* 14 19  --  15  CREATININE 1.06 1.07 0.92  --  0.58*  CALCIUM 9.3 9.2 8.7*  --  8.6*  MG  --   --   --  1.8 1.9  GFRNONAA >60  --  >60  --  >60  ANIONGAP 9  --  9  --  6    No results for input(s): "PROT", "ALBUMIN", "AST", "ALT", "ALKPHOS", "BILITOT" in the last 168 hours. Lipids No results for input(s): "CHOL", "TRIG", "HDL", "LABVLDL", "LDLCALC", "CHOLHDL" in the last 168 hours.  Hematology Recent Labs  Lab 11/13/21 1252 11/14/21 0935 11/15/21 0523  WBC 15.3* 9.2 5.5  RBC 4.79 4.43 4.28  HGB 15.0 13.7 13.1  HCT 43.4 41.1 40.4  MCV 91 92.8 94.4  MCH 31.3 30.9 30.6  MCHC 34.6 33.3 32.4   RDW 17.7* 17.5* 17.4*  PLT 129* 106* 114*   Thyroid No results for input(s): "TSH", "FREET4" in the last 168 hours.  BNPNo results for input(s): "BNP", "PROBNP" in the last 168 hours.  DDimer No results for input(s): "DDIMER" in the last 168 hours.   Radiology/Studies:  LONG TERM MONITOR (3-14 DAYS)  Result Date: 11/14/2021  Two monitors.  Patient had a minimum heart rate of 47 bpm, maximum heart rate of 193 bpm, and average heart rate of 83 bpm.  Predominant underlying rhythm was sinus rhythym.  Paroxsymal SVT, 31 episodes lasting  18 secondsat longest with a max rate of 187 bpm at fastest.  NSVT, 9 episodes lasting 4 beats at longest with a max rate of 193 bpm at fastest.  Isolated PACs were rare (<1.0%).  Isolated PVCs were frequent (16%).  Triggered and diary events associated with sinus rhythm, sinus tachycardia, PACs, and PVCs.  Frequent PVCs   DG Chest 2 View  Result Date: 11/14/2021 CLINICAL DATA:  Chest pain with increased heart rate and chest tightness. Possible atrial fibrillation. History of diabetes. EXAM: CHEST - 2 VIEW COMPARISON:  Radiographs 11/09/2021 and 10/19/2021. FINDINGS: The heart size and mediastinal contours are stable with a moderate size hiatal hernia. The lungs are clear. There is no pleural effusion or pneumothorax. No acute osseous findings. Previous cervical fusion. Telemetry leads overlie the chest. IMPRESSION: Stable chest.  No active cardiopulmonary process. Electronically Signed   By: Richardean Sale M.D.   On: 11/14/2021 10:22     Assessment and Plan:   1. Atrial Fibrillation with RVR - He has undergone 2 cardioversions within the past 2 months with recurrent atrial fibrillation and was previously referred to EP as an outpatient but his follow-up visit is not until 12/2021. Despite titration of AV nodal blocking agents, his heart rate increases into the 150's with minimal activity. - Reviewed with Dr. Johnsie Cancel and will plan to transfer to [facility] for  electrophysiology evaluation and likely initiation of antiarrhythmic therapy. He did have severe dilation of his left atrium by recent echocardiogram. - He just received Toprol-XL 75 mg daily and will continue for now with titration as BP allows. Remains on Eliquis 5 mg twice daily for anticoagulation and reports compliance with this with no missed doses in the past 6+ weeks.  2. PVC's - Recent monitor showed a 16% PVC burden and he is symptomatic with these as well. Toprol-XL has been titrated to 75 mg daily. EP evaluation pending.  3. HFmrEF  - His EF was at 45-50% in 08/2021 with cath showing no significant CAD.  Cardiomyopathy is likely secondary to his atrial fibrillation and PVC's. He does not appear volume overloaded on examination today. - Continue Toprol-XL. He is no longer on SGLT2 inhibitor therapy given Urosepsis. Would consider adding a low-dose ARB prior to discharge if BP allows but will hold for now to allow for further titration of AV nodal blocking agents.  4. OSA - Previously intolerant to CPAP. May be a candidate for [facility] device.  5. Urosepsis - Felt to be secondary to recent initiation of SGLT2 inhibitor. Febrile to 102.4 on admission but improved. Being treated with Rocephin. Will ask the hospitalist team to comment on duration of therapy and if needed, can reconsult them once at [facility].   For questions or updates, please contact [place] Please consult [email or web address] for contact info under    Signed, Erma Heritage, PA-C  11/15/2021 9:22 AM  Patient examined chart reviewed. No murmur, lungs clear abdomen benign Recurrent highly symptomatic PAF this time related to UTI.  He is unable to wear CPAP reliably Despite Dr Percell Locus not he has not missed any eliquis in over 3 weeks Choice of AAT complicated by high PVC burden and low EF despite not having CAD. His EP appointment is not till August. Favor transfer to [facility] to see EP and ? Start Tikosyn or other AAT and  repeat [facility] this admission No EP or cardiology coverage at [facility] over holiday. Continue Rx for UTI  Ultimately would benefit from ablation Discussed having Dr  Turner refer him to ENT for Inspiris device   Jenkins Rouge MD [facility]

## 2021-11-15 NOTE — Progress Notes (Signed)
Patients heart rate went between 120's-150's patient  was up to the bathroom, this nurse paged MD. MD stated to give IV metoprolol if he is symptomatic,when patient returned to bed this nurse went to room and asked how patient was feeling patient stated  he feels like his heart is fluttering and feeling like he can hear is heart beat in his ears, feels SOB no energy. Pushed IV metoprolol. Patient still complaining  of chest pressure, MD paged.MD placed order for 12 EKG and Troponin. Current heart rate is 102.

## 2021-11-15 NOTE — TOC Progression Note (Signed)
  Transition of Care [facility]) Screening Note   Patient Details  Name: Stephen Roberts Date of Birth: 1966/01/10   Transition of Care [facility]) CM/SW Contact:    Boneta Lucks, RN Phone Number: 11/15/2021, 10:13 AM    Transition of Care Department [facility]) has reviewed patient and no TOC needs have been identified at this time. We will continue to monitor patient advancement through interdisciplinary progression rounds. If new patient transition needs arise, please place a TOC consult.      Expected Discharge Plan: Home/Self Care Barriers to Discharge: Continued Medical Work up  Expected Discharge Plan and Services Expected Discharge Plan: Home/Self Care

## 2021-11-15 NOTE — Telephone Encounter (Signed)
Spoke to patient's wife, he is in the hospital right now. She states that her husband has tried the nose piece and the mouth piece with no success. He is back in afib and there is talk of him having an ablation. She states that the doctor at the hospital is going to send Dr. Radford Pax a message to discuss other options. Just FYI.

## 2021-11-15 NOTE — Progress Notes (Signed)
PROGRESS NOTE    Stephen Roberts  CZY:[id number] DOB: 25-Feb-1966 DOA: 11/14/2021 PCP: Lindell Spar, MD   Brief Narrative:    Stephen Roberts is a 56 y.o. male with medical history significant for atrial fibrillation on Eliquis, type 2 diabetes, iron deficiency anemia, hypertension, OSA, tobacco abuse, GERD, dyslipidemia, and obesity who was recently diagnosed with UTI at urgent care facility.  Patient presented to the ED with chest tightness, shortness of breath, and lightheadedness.  He was admitted for sepsis secondary to E. coli UTI and was also noted to have recurrent symptomatic atrial fibrillation with RVR.  He has been started on Rocephin empirically for treatment with sensitivities on urine culture pending.  Cardiology recommending transfer to [facility] under cardiology service for EP evaluation.  Assessment & Plan:   Principal Problem:   Atrial fibrillation with RVR  Active Problems:   Type 2 diabetes mellitus with other specified complication (HCC)   GERD (gastroesophageal reflux disease)   Essential hypertension   Iron deficiency anemia   Persistent atrial fibrillation (HCC)   Sepsis secondary to UTI (HCC)  Assessment and Plan:   Sepsis, present on admission secondary to E Coli UTI -Noted tachycardia, tachypnea, and fever on admission  -Noted to be pansensitive on cultures -Maintain on Rocephin for total 3 days, currently on day 2/3   Symptomatic atrial fibrillation with RVR -Likely being aggravated by UTI -Transfer to [facility] for EP evaluation per cardiology -Continue Toprol-XL as prescribed   Atrial fibrillation on chronic anticoagulation -Continue Eliquis for anticoagulation -Continue Toprol XL at 75 mg daily as recommended by cardiology 6/27   Iron deficiency anemia -Follow CBC -Outpatient hematology follow-up for iron infusions   Type 2 diabetes -Instructed to discontinue [place] given UTI -Continue SSI   Hypertension -Continue losartan and  Toprol   Dyslipidemia -Continue pravastatin   Tobacco abuse -States that he has quit   Alcohol abuse -Counseled on cessation -States that he has not had any significant alcohol use in the last 1 week   GERD -PPI   Morbid obesity/OSA -BMI 36.70, lifestyle changes outpatient -CPAP at night    DVT prophylaxis: Eliquis Code Status: Full Family Communication: Spouse at bedside 7/3 Disposition Plan:  Status is: Observation The patient will require care spanning > 2 midnights and should be moved to inpatient because: IV medications and EP evaluation.  Consultants:  Cardiology  Procedures:  None  Antimicrobials:  Anti-infectives (From admission, onward)    Start     Dose/Rate Route Frequency Ordered Stop   11/14/21 1600  cefTRIAXone (ROCEPHIN) 1 g in sodium chloride 0.9 % 100 mL IVPB        1 g 200 mL/hr over 30 Minutes Intravenous Every 24 hours 11/14/21 1455        Subjective: Patient seen and evaluated today with some ongoing chest pressure and palpitations as well as mild dyspnea.  Heart rates continue to remain elevated.  He continues to have some dysuria.  Objective: Vitals:   11/15/21 0031 11/15/21 0402 11/15/21 0839 11/15/21 0916  BP: 108/79 116/85 130/90 114/87  Pulse: 74 (!) 40 62 (!) 114  Resp: 18 18    Temp: 98.9 F (37.2 C) 98.1 F (36.7 C)    TempSrc: Oral Oral    SpO2: 97% 98%    Weight:      Height:        Intake/Output Summary (Last 24 hours) at 11/15/2021 1044 Last data filed at 11/15/2021 0400 Gross per 24 hour  Intake 929.5 ml  Output 100 ml  Net 829.5 ml   Filed Weights   11/14/21 0933 11/14/21 1546  Weight: 106.3 kg 105.5 kg    Examination:  General exam: Appears calm and comfortable  Respiratory system: Clear to auscultation. Respiratory effort normal. Cardiovascular system: S1 & S2 heard, irregular and tachycardic. Gastrointestinal system: Abdomen is soft Central nervous system: Alert and awake Extremities: No edema Skin: No  significant lesions noted Psychiatry: Flat affect.    Data Reviewed: I have personally reviewed following labs and imaging studies  CBC: Recent Labs  Lab 11/09/21 1505 11/13/21 1252 11/14/21 0935 11/15/21 0523  WBC 7.2 15.3* 9.2 5.5  NEUTROABS  --  12.1*  --   --   HGB 14.9 15.0 13.7 13.1  HCT 45.2 43.4 41.1 40.4  MCV 93.2 91 92.8 94.4  PLT 155 129* 106* 419*   Basic Metabolic Panel: Recent Labs  Lab 11/09/21 1505 11/13/21 1252 11/14/21 0935 11/14/21 0947 11/15/21 0523  NA 139 137 135  --  135  K 4.3 4.4 3.5  --  3.8  CL 105 98 103  --  105  CO2 25 19* 23  --  24  GLUCOSE 152* 203* 260*  --  154*  BUN 29* 14 19  --  15  CREATININE 1.06 1.07 0.92  --  0.58*  CALCIUM 9.3 9.2 8.7*  --  8.6*  MG  --   --   --  1.8 1.9   GFR: Estimated Creatinine Clearance: 121.9 mL/min (A) (by C-G formula based on SCr of 0.58 mg/dL (L)). Liver Function Tests: No results for input(s): "AST", "ALT", "ALKPHOS", "BILITOT", "PROT", "ALBUMIN" in the last 168 hours. No results for input(s): "LIPASE", "AMYLASE" in the last 168 hours. No results for input(s): "AMMONIA" in the last 168 hours. Coagulation Profile: Recent Labs  Lab 11/14/21 1149  INR 1.3*   Cardiac Enzymes: No results for input(s): "CKTOTAL", "CKMB", "CKMBINDEX", "TROPONINI" in the last 168 hours. BNP (last 3 results) No results for input(s): "PROBNP" in the last 8760 hours. HbA1C: No results for input(s): "HGBA1C" in the last 72 hours. CBG: Recent Labs  Lab 11/14/21 1615 11/14/21 2105 11/15/21 0724  GLUCAP 135* 221* 174*   Lipid Profile: No results for input(s): "CHOL", "HDL", "LDLCALC", "TRIG", "CHOLHDL", "LDLDIRECT" in the last 72 hours. Thyroid Function Tests: No results for input(s): "TSH", "T4TOTAL", "FREET4", "T3FREE", "THYROIDAB" in the last 72 hours. Anemia Panel: No results for input(s): "VITAMINB12", "FOLATE", "FERRITIN", "TIBC", "IRON", "RETICCTPCT" in the last 72 hours. Sepsis Labs: Recent Labs   Lab 11/14/21 1149 11/14/21 1308  LATICACIDVEN 1.1 1.3    Recent Results (from the past 240 hour(s))  Urine Culture     Status: Abnormal   Collection Time: 11/13/21 12:42 PM   Specimen: Urine, Clean Catch  Result Value Ref Range Status   Specimen Description   Final    URINE, CLEAN CATCH Performed at [facility], [address] [place], [place] [address]    Special Requests   Final    NONE Performed at [facility], [address], [place], [place] [address]    Culture >=100,000 COLONIES/mL ESCHERICHIA COLI (A)  Final   Report Status 11/15/2021 FINAL  Final   Organism ID, Bacteria ESCHERICHIA COLI (A)  Final      Susceptibility   Escherichia coli - MIC*    AMPICILLIN <=2 SENSITIVE Sensitive     CEFAZOLIN <=4 SENSITIVE Sensitive     CEFEPIME <=0.12 SENSITIVE Sensitive  CEFTRIAXONE <=0.25 SENSITIVE Sensitive     CIPROFLOXACIN <=0.25 SENSITIVE Sensitive     GENTAMICIN <=1 SENSITIVE Sensitive     IMIPENEM <=0.25 SENSITIVE Sensitive     NITROFURANTOIN <=16 SENSITIVE Sensitive     TRIMETH/SULFA <=20 SENSITIVE Sensitive     AMPICILLIN/SULBACTAM <=2 SENSITIVE Sensitive     PIP/TAZO <=4 SENSITIVE Sensitive     * >=100,000 COLONIES/mL ESCHERICHIA COLI  Blood Culture (routine x 2)     Status: None (Preliminary result)   Collection Time: 11/14/21 11:49 AM   Specimen: Left Antecubital; Blood  Result Value Ref Range Status   Specimen Description   Final    LEFT ANTECUBITAL BOTTLES DRAWN AEROBIC AND ANAEROBIC   Special Requests Blood Culture adequate volume  Final   Culture   Final    NO GROWTH < 12 HOURS Performed at [facility], [address] [place], [place] [address]    Report Status PENDING  Incomplete  Blood Culture (routine x 2)     Status: None (Preliminary result)   Collection Time: 11/14/21 11:49 AM   Specimen: BLOOD LEFT HAND  Result Value Ref Range Status   Specimen Description   Final    BLOOD LEFT HAND BOTTLES DRAWN AEROBIC AND ANAEROBIC   Special Requests  Blood Culture adequate volume  Final   Culture   Final    NO GROWTH < 12 HOURS Performed at [facility], [address], [place], [place] [address]    Report Status PENDING  Incomplete         Radiology Studies: DG Chest 2 View  Result Date: 11/14/2021 CLINICAL DATA:  Chest pain with increased heart rate and chest tightness. Possible atrial fibrillation. History of diabetes. EXAM: CHEST - 2 VIEW COMPARISON:  Radiographs 11/09/2021 and 10/19/2021. FINDINGS: The heart size and mediastinal contours are stable with a moderate size hiatal hernia. The lungs are clear. There is no pleural effusion or pneumothorax. No acute osseous findings. Previous cervical fusion. Telemetry leads overlie the chest. IMPRESSION: Stable chest.  No active cardiopulmonary process. Electronically Signed   By: Richardean Sale M.D.   On: 11/14/2021 10:22        Scheduled Meds:  apixaban  5 mg Oral BID   ferrous sulfate  325 mg Oral BID   insulin aspart  0-15 Units Subcutaneous TID WC   insulin aspart  0-5 Units Subcutaneous QHS   metFORMIN  500 mg Oral Q breakfast   metoprolol succinate  75 mg Oral Daily   multivitamin with minerals  1 tablet Oral Daily   pantoprazole  40 mg Oral Daily   pravastatin  20 mg Oral Once per day on Mon Wed Fri   sodium chloride flush  3 mL Intravenous Q12H   vitamin B-12  1,000 mcg Oral Daily   Continuous Infusions:  sodium chloride     cefTRIAXone (ROCEPHIN)  IV 200 mL/hr at 11/14/21 1800     LOS: 0 days    Time spent: 35 minutes    Brehanna Deveny Darleen Crocker, DO [facility]  If 7PM-7AM, please contact night-coverage [email or web address] 11/15/2021, 10:44 AM

## 2021-11-15 NOTE — Progress Notes (Signed)
   11/15/21 0916  Assess: MEWS Score  BP 114/87  MAP (mmHg) 96  Pulse Rate (!) 114  Assess: MEWS Score  MEWS Temp 0  MEWS Systolic 0  MEWS Pulse 2  MEWS RR 0  MEWS LOC 0  MEWS Score 2  MEWS Score Color Yellow  Assess: if the MEWS score is Yellow or Red  Were vital signs taken at a resting state? Yes  Focused Assessment No change from prior assessment  Does the patient meet 2 or more of the SIRS criteria? No  MEWS guidelines implemented *See Row Information* Yes  Treat  MEWS Interventions Other (Comment);Administered prn meds/treatments (MD made aware)  Pain Scale 0-10  Pain Score 4  Pain Type Acute pain  Pain Location Chest  Pain Orientation Left;Anterior  Pain Descriptors / Indicators Discomfort;Heaviness;Pressure  Pain Frequency Intermittent  Pain Onset On-going  Take Vital Signs  Increase Vital Sign Frequency  Yellow: Q 2hr X 2 then Q 4hr X 2, if remains yellow, continue Q 4hrs  Escalate  MEWS: Escalate Yellow: discuss with charge nurse/RN and consider discussing with provider and RRT  Notify: Charge Nurse/RN  Name of Charge Nurse/RN Notified [place]  Date Charge Nurse/RN Notified 11/15/21  Time Charge Nurse/RN Notified 0175  Notify: Provider  Provider Name/Title Dr Manuella Ghazi  Date Provider Notified 11/15/21  Time Provider Notified [phone]  Method of Notification Page  Notification Reason Other (Comment) (hear rate change)  Provider response See new orders  Date of Provider Response 11/15/21  Time of Provider Response 0917  Assess: SIRS CRITERIA  SIRS Temperature  0  SIRS Pulse 1  SIRS Respirations  0  SIRS WBC 0  SIRS Score Sum  1

## 2021-11-16 DIAGNOSIS — I493 Ventricular premature depolarization: Secondary | ICD-10-CM | POA: Diagnosis not present

## 2021-11-16 DIAGNOSIS — I4819 Other persistent atrial fibrillation: Secondary | ICD-10-CM | POA: Diagnosis not present

## 2021-11-16 LAB — BASIC METABOLIC PANEL
Anion gap: 9 (ref 5–15)
BUN: 11 mg/dL (ref 6–20)
CO2: 22 mmol/L (ref 22–32)
Calcium: 8.9 mg/dL (ref 8.9–10.3)
Chloride: 106 mmol/L (ref 98–111)
Creatinine, Ser: 0.66 mg/dL (ref 0.61–1.24)
GFR, Estimated: >60 mL/min (ref >60)
Glucose, Bld: 133 mg/dL — ABNORMAL HIGH (ref 70–99)
Potassium: 3.9 mmol/L (ref 3.5–5.1)
Sodium: 137 mmol/L (ref 135–145)

## 2021-11-16 LAB — CBC
HCT: 40.3 % (ref 39.0–52.0)
Hemoglobin: 13.7 g/dL (ref 13.0–17.0)
MCH: 31.4 pg (ref 26.0–34.0)
MCHC: 34 g/dL (ref 30.0–36.0)
MCV: 92.4 fL (ref 80.0–100.0)
Platelets: 118 10*3/uL — ABNORMAL LOW (ref 150–400)
RBC: 4.36 MIL/uL (ref 4.22–5.81)
RDW: 16.9 % — ABNORMAL HIGH (ref 11.5–15.5)
WBC: 6.1 10*3/uL (ref 4.0–10.5)
nRBC: 0 % (ref 0.0–0.2)

## 2021-11-16 LAB — MRSA NEXT GEN BY PCR, NASAL: MRSA by PCR Next Gen: NOT DETECTED

## 2021-11-16 LAB — GLUCOSE, CAPILLARY
Glucose-Capillary: 197 mg/dL — ABNORMAL HIGH (ref 70–99)
Glucose-Capillary: 124 mg/dL — ABNORMAL HIGH (ref 70–99)
Glucose-Capillary: 264 mg/dL — ABNORMAL HIGH (ref 70–99)
Glucose-Capillary: 141 mg/dL — ABNORMAL HIGH (ref 70–99)

## 2021-11-16 LAB — MAGNESIUM: Magnesium: 1.8 mg/dL (ref 1.7–2.4)

## 2021-11-16 MED ORDER — METOPROLOL SUCCINATE ER 100 MG PO TB24
100.0000 mg | ORAL_TABLET | Freq: Every day | ORAL | Status: DC
  Administered 2021-11-16 – 2021-11-18 (×3): 100 mg via ORAL
  Filled 2021-11-16 (×3): qty 1

## 2021-11-16 MED ORDER — FLECAINIDE ACETATE 100 MG PO TABS
300.0000 mg | ORAL_TABLET | Freq: Once | ORAL | Status: AC
  Administered 2021-11-16: 300 mg via ORAL
  Filled 2021-11-16: qty 3

## 2021-11-16 MED ORDER — FLECAINIDE ACETATE 100 MG PO TABS
100.0000 mg | ORAL_TABLET | Freq: Two times a day (BID) | ORAL | Status: DC
  Administered 2021-11-16 – 2021-11-18 (×4): 100 mg via ORAL
  Filled 2021-11-16 (×5): qty 1

## 2021-11-16 NOTE — Progress Notes (Signed)
Progress Note  Patient Name: AVARI GELLES Date of Encounter: 11/16/2021  Primary Cardiologist: Werner Lean, MD   Subjective   Overnight transferred to [facility]. Patient notes a lot of frustration, still having palpitations that impede QOL. We reviewed that he cannot use any of the CPAP device he has tried. No CP, SOB.  Inpatient Medications    Scheduled Meds:  apixaban  5 mg Oral BID   ferrous sulfate  325 mg Oral BID   insulin aspart  0-15 Units Subcutaneous TID WC   insulin aspart  0-5 Units Subcutaneous QHS   metFORMIN  500 mg Oral Q breakfast   metoprolol succinate  75 mg Oral Daily   multivitamin with minerals  1 tablet Oral Daily   pantoprazole  40 mg Oral Daily   pravastatin  20 mg Oral Once per day on Mon Wed Fri   sodium chloride flush  3 mL Intravenous Q12H   vitamin B-12  1,000 mcg Oral Daily   Continuous Infusions:  sodium chloride     cefTRIAXone (ROCEPHIN)  IV 1 g (11/15/21 1612)   PRN Meds: sodium chloride, acetaminophen **OR** acetaminophen, ibuprofen, metoprolol tartrate, ondansetron **OR** ondansetron (ZOFRAN) IV, oxyCODONE-acetaminophen, sodium chloride flush   Vital Signs    Vitals:   11/15/21 2025 11/15/21 2239 11/15/21 2345 11/16/21 0344  BP: 108/72 118/82 (!) 115/92 105/77  Pulse: 77 (!) 104 69 70  Resp: '20 14 17 16  '$ Temp: 98 F (36.7 C) 98.1 F (36.7 C) 98.4 F (36.9 C) 98.8 F (37.1 C)  TempSrc: Oral Oral Oral Oral  SpO2: 98% 96% 97% 97%  Weight:  106.7 kg    Height:  '5\' 9"'$  (1.753 m)      Intake/Output Summary (Last 24 hours) at 11/16/2021 0741 Last data filed at 11/16/2021 0700 Gross per 24 hour  Intake 960 ml  Output 300 ml  Net 660 ml   Filed Weights   11/14/21 0933 11/14/21 1546 11/15/21 2239  Weight: 106.3 kg 105.5 kg 106.7 kg    Telemetry    AF rates as high as 140s - Personally Reviewed  ECG    AF rate 99 rare PVC QTc 440 - Personally Reviewed  Physical Exam   Gen: no distress Neck: No JVD   Cardiac: No Rubs or Gallops, no murmur, IRIR tachycardia, +2 radial pulses Respiratory: Clear to auscultation bilaterally, normal effort, normal  respiratory rate GI: Soft, nontender, non-distended  MS: No  edema;  moves all extremities Integument: Skin feels warm Neuro:  At time of evaluation, alert and oriented to person/place/time/situation  Psych: Normal affect, patient feels frustrated   Labs    Chemistry Recent Labs  Lab 11/14/21 0935 11/15/21 0523 11/16/21 0153  NA 135 135 137  K 3.5 3.8 3.9  CL 103 105 106  CO2 '23 24 22  '$ GLUCOSE 260* 154* 133*  BUN '19 15 11  '$ CREATININE 0.92 0.58* 0.66  CALCIUM 8.7* 8.6* 8.9  GFRNONAA >60 >60 >60  ANIONGAP '9 6 9     '$ Hematology Recent Labs  Lab 11/14/21 0935 11/15/21 0523 11/16/21 0153  WBC 9.2 5.5 6.1  RBC 4.43 4.28 4.36  HGB 13.7 13.1 13.7  HCT 41.1 40.4 40.3  MCV 92.8 94.4 92.4  MCH 30.9 30.6 31.4  MCHC 33.3 32.4 34.0  RDW 17.5* 17.4* 16.9*  PLT 106* 114* 118*    Cardiac EnzymesNo results for input(s): "TROPONINI" in the last 168 hours. No results for input(s): "TROPIPOC" in the last 168  hours.   BNPNo results for input(s): "BNP", "PROBNP" in the last 168 hours.   DDimer No results for input(s): "DDIMER" in the last 168 hours.   Radiology    DG Chest 2 View  Result Date: 11/14/2021 CLINICAL DATA:  Chest pain with increased heart rate and chest tightness. Possible atrial fibrillation. History of diabetes. EXAM: CHEST - 2 VIEW COMPARISON:  Radiographs 11/09/2021 and 10/19/2021. FINDINGS: The heart size and mediastinal contours are stable with a moderate size hiatal hernia. The lungs are clear. There is no pleural effusion or pneumothorax. No acute osseous findings. Previous cervical fusion. Telemetry leads overlie the chest. IMPRESSION: Stable chest.  No active cardiopulmonary process. Electronically Signed   By: Richardean Sale M.D.   On: 11/14/2021 10:22     Patient Profile     56 y.o. male with a history of AF  RVR s/p two DCCV seen for consideration of AAD as a bridge to ablation  Assessment & Plan    Persistent Atrial Fibrillation Complicated by HFmrEF, rare PVCs and OSA with CPAP intolerance Urosepsis on SGLT2i - on eliquis with recent DCCV (no doses missed per patient); Acquired Thrombophilia - no plans to restart SGLT2i - will increase succinate dose to 100, no BP room for ARB at this point - I think patient would be reasonable candidate for Sotalol vs Tikoysn, appreciate EP consultation (patient was originally under the impression he was transferred her for AF ablation) - at discharge will send ambulatory refer to ENT for [facility] Consideration    For questions or updates, please contact [facility] Please consult [email or web address] for contact info under Cardiology/STEMI.      Rudean Haskell, MD [place], [address] [place], [place] [address] [phone]  7:41 AM

## 2021-11-16 NOTE — Consult Note (Signed)
Cardiology Consultation:   Patient ID: Stephen Roberts MRN: [id number]; DOB: [birth year]  Admit date: 11/14/2021 Date of Consult: 11/16/2021  PCP:  Lindell Spar, MD   [facility] Providers Cardiologist:  Werner Lean, MD        Patient Profile:   Stephen Roberts is a 56 y.o. male with a hx of atrial fibrillation who is being seen 11/16/2021 for the evaluation of atrial fibrillation at the request of Jenkins Rouge.  History of Present Illness:   [name] is a 56 year old male with a history of atrial fibrillation.  He has been in atrial fibrillation for the past few months.  Atrial fibrillation makes him feel weak, fatigued, short of breath.  He presented to the hospital April 2023 with chest pain and was found to have atrial fibrillation.  Left heart catheterization showed no evidence of coronary artery disease.  He had an echo that showed a mildly reduced ejection fraction.  He has had attempts at cardioversion, but each time is gone back into atrial fibrillation.  After cardioversion he is felt much improved.  He represented to the hospital with atrial fibrillation feeling quite poorly.   Past Medical History:  Diagnosis Date   A-fib (Stephen Roberts)    Arthritis    Diabetes (HCC)    GERD (gastroesophageal reflux disease)    Heart murmur    Pneumonia 06/27/2006    Past Surgical History:  Procedure Laterality Date   ANTERIOR FUSION CERVICAL SPINE     CARDIOVERSION N/A 10/05/2021   Procedure: CARDIOVERSION;  Surgeon: Sanda Klein, MD;  Location: [place];  Service: Cardiovascular;  Laterality: N/A;   COLONOSCOPY WITH PROPOFOL N/A 03/26/2020   Procedure: COLONOSCOPY WITH PROPOFOL;  Surgeon: Daneil Dolin, MD;  [facility];  Service: Endoscopy;  Laterality: N/A;   disc in neck Left 05/2017   ESOPHAGEAL DILATION N/A 03/25/2020   Procedure: ESOPHAGEAL DILATION;  Surgeon: Rogene Houston, MD;  [facility];  Service: Endoscopy;  Laterality: N/A;    ESOPHAGOGASTRODUODENOSCOPY (EGD) WITH PROPOFOL N/A 03/25/2020   Procedure: ESOPHAGOGASTRODUODENOSCOPY (EGD) WITH PROPOFOL;  Surgeon: Rogene Houston, MD;  [facility];  Service: Endoscopy;  Laterality: N/A;   GIVENS CAPSULE STUDY N/A 04/13/2020   Procedure: GIVENS CAPSULE STUDY;  Surgeon: Daneil Dolin, MD;  [facility];  Service: Endoscopy;  Laterality: N/A;  7:30am   INSERTION OF MESH N/A 01/23/2013   Procedure: INSERTION OF MESH;  Surgeon: Adin Hector, MD;  [facility];  Service: General;  Laterality: N/A;   LEFT HEART CATH AND CORONARY ANGIOGRAPHY N/A 08/27/2021   Procedure: LEFT HEART CATH AND CORONARY ANGIOGRAPHY;  Surgeon: Martinique, Peter M, MD;  Location: [place] [facility];  Service: Cardiovascular;  Laterality: N/A;   POLYPECTOMY  03/26/2020   Procedure: POLYPECTOMY;  Surgeon: Daneil Dolin, MD;  [facility];  Service: Endoscopy;;   TONSILLECTOMY     as child   VENTRAL HERNIA REPAIR N/A 01/23/2013   Procedure: LAPAROSCOPIC MULTIPLE VENTRAL HERNIAS;  Surgeon: Adin Hector, MD;  Location: [facility];  Service: General;  Laterality: N/A;     Home Medications:  Prior to Admission medications   Medication Sig Start Date End Date Taking? Authorizing Provider  acetaminophen (TYLENOL) 650 MG CR tablet Take 1,300 mg by mouth every 8 (eight) hours as needed for pain.   Yes [provider]  albuterol (VENTOLIN HFA) 108 (90 Base) MCG/ACT inhaler INHALE 2 PUFFS INTO THE  LUNGS EVERY 6 HOURS AS NEEDED FOR WHEEZING OR SHORTNESS OF BREATH 09/08/20  Yes Susy Frizzle, MD  apixaban (ELIQUIS) 5 MG TABS tablet Take 1 tablet (5 mg total) by mouth 2 (two) times daily. 09/14/21  Yes Loel Dubonnet, NP  Ascorbic Acid (VITAMIN C PO) Take 500 mg by mouth daily.   Yes [provider]  esomeprazole (NEXIUM) 40 MG capsule Take 1 capsule (40 mg total) by mouth daily. 09/01/21  Yes Lindell Spar, MD  ferrous sulfate 325 (65 FE) MG EC tablet  Take 1 tablet (325 mg total) by mouth 2 (two) times daily. 08/30/21 08/30/22 Yes Lindell Spar, MD  furosemide (LASIX) 20 MG tablet Take 1 tablet (20 mg total) by mouth daily. 09/16/21 09/11/22 Yes Loel Dubonnet, NP  ibuprofen (ADVIL) 200 MG tablet Take 800 mg by mouth every 6 (six) hours as needed for moderate pain.   Yes [provider]  Lactobacillus Rhamnosus, GG, (PROBIOTIC COLIC) LIQD Take by mouth.   Yes [provider]  losartan (COZAAR) 25 MG tablet Take 1 tablet (25 mg total) by mouth daily. 09/14/21 03/13/22 Yes Loel Dubonnet, NP  metFORMIN (GLUCOPHAGE) 500 MG tablet Take 1 tablet (500 mg total) by mouth 2 (two) times daily with a meal. Patient taking differently: Take 500 mg by mouth daily with breakfast. 09/01/21  Yes Lindell Spar, MD  metoprolol succinate (TOPROL-XL) 50 MG 24 hr tablet Take 1.5 tablets (75 mg total) by mouth daily. 11/09/21 12/09/21 Yes Noemi Chapel, MD  Multiple Vitamin (MULTIVITAMIN) tablet Take 1 tablet by mouth daily.   Yes [provider]  oxyCODONE-acetaminophen (PERCOCET/ROXICET) 5-325 MG tablet Take 1 tablet by mouth every 8 (eight) hours as needed for severe pain.   Yes [provider]  potassium chloride SA (KLOR-CON M) 10 MEQ tablet Take 1 tablet (10 mEq total) by mouth daily. 09/16/21 09/11/22 Yes Loel Dubonnet, NP  pravastatin (PRAVACHOL) 20 MG tablet Take one tablet three times per week. 10/26/21  Yes Loel Dubonnet, NP  Turmeric 500 MG CAPS Take 500 mg by mouth daily.   Yes [provider]  vitamin B-12 (CYANOCOBALAMIN) 1000 MCG tablet Take 1,000 mcg by mouth daily.   Yes [provider]  dapagliflozin propanediol (FARXIGA) 10 MG TABS tablet Take 1 tablet (10 mg total) by mouth daily before breakfast. Patient not taking: Reported on 11/14/2021 10/26/21   Loel Dubonnet, NP  sulfamethoxazole-trimethoprim (BACTRIM DS) 800-160 MG tablet Take 1 tablet by mouth 2 (two) times daily. 11/13/21   Volney American, PA-C    Inpatient Medications: Scheduled Meds:  apixaban  5 mg Oral BID   ferrous sulfate  325 mg Oral BID   flecainide  100 mg Oral Q12H   flecainide  300 mg Oral Once   insulin aspart  0-15 Units Subcutaneous TID WC   insulin aspart  0-5 Units Subcutaneous QHS   metFORMIN  500 mg Oral Q breakfast   metoprolol succinate  100 mg Oral Daily   multivitamin with minerals  1 tablet Oral Daily   pantoprazole  40 mg Oral Daily   pravastatin  20 mg Oral Once per day on Mon Wed Fri   sodium chloride flush  3 mL Intravenous Q12H   vitamin B-12  1,000 mcg Oral Daily   Continuous Infusions:  sodium chloride     cefTRIAXone (ROCEPHIN)  IV 1 g (11/15/21 1612)   PRN Meds: sodium chloride, acetaminophen **OR** acetaminophen, ibuprofen, metoprolol  tartrate, ondansetron **OR** ondansetron (ZOFRAN) IV, oxyCODONE-acetaminophen, sodium chloride flush  Allergies:    Allergies  Allergen Reactions   Gabapentin Shortness Of Breath   Lyrica [Pregabalin] Shortness Of Breath   Crestor [Rosuvastatin] Other (See Comments)    myalgia   Farxiga [Dapagliflozin]     Developed UTI    Social History:   Social History   Socioeconomic History   Marital status: Married    Spouse name: Not on file   Number of children: Not on file   Years of education: Not on file   Highest education level: Not on file  Occupational History   Not on file  Tobacco Use   Smoking status: Every Day    Packs/day: 0.50    Years: 42.00    Total pack years: 21.00    Types: Cigarettes    Start date: 11   Smokeless tobacco: Never  Vaping Use   Vaping Use: Never used  Substance and Sexual Activity   Alcohol use: Yes    Comment: moderately   Drug use: Not Currently    Types: Cocaine    Comment: states he has tried it all. Last used cocaine a few months ago.    Sexual activity: Yes  Other Topics Concern   Not on file  Social History Narrative   Not on file   Social Determinants of Health    Financial Resource Strain: Low Risk  (04/20/2020)   Overall Financial Resource Strain (CARDIA)    Difficulty of Paying Living Expenses: Not hard at all  Food Insecurity: No Food Insecurity (04/20/2020)   Hunger Vital Sign    Worried About Running Out of Food in the Last Year: Never true    Ran Out of Food in the Last Year: Never true  Transportation Needs: No Transportation Needs (04/20/2020)   PRAPARE - Hydrologist (Medical): No    Lack of Transportation (Non-Medical): No  Physical Activity: Inactive (04/20/2020)   Exercise Vital Sign    Days of Exercise per Week: 0 days    Minutes of Exercise per Session: 0 min  Stress: Stress Concern Present (04/20/2020)   [place]    Feeling of Stress : Very much  Social Connections: Moderately Isolated (04/20/2020)   Social Connection and Isolation Panel [NHANES]    Frequency of Communication with Friends and Family: More than three times a week    Frequency of Social Gatherings with Friends and Family: More than three times a week    Attends Religious Services: Never    Marine scientist or Organizations: No    Attends Archivist Meetings: Never    Marital Status: Married  Human resources officer Violence: Not At Risk (04/20/2020)   Humiliation, Afraid, Rape, and Kick questionnaire    Fear of Current or Ex-Partner: No    Emotionally Abused: No    Physically Abused: No    Sexually Abused: No    Family History:    Family History  Problem Relation Age of Onset   Depression Mother    Hearing loss Mother    Hyperlipidemia Mother    Hypertension Mother    Stroke Mother    Early death Father    Diabetes Brother    Pancreatic cancer Paternal Uncle    Colon cancer Neg Hx      ROS:  Please see the history of present illness.   All other ROS reviewed and negative.  Physical Exam/Data:   Vitals:   11/15/21 2025 11/15/21 2239  11/15/21 2345 11/16/21 0344  BP: 108/72 118/82 (!) 115/92 105/77  Pulse: 77 (!) 104 69 70  Resp: '20 14 17 16  '$ Temp: 98 F (36.7 C) 98.1 F (36.7 C) 98.4 F (36.9 C) 98.8 F (37.1 C)  TempSrc: Oral Oral Oral Oral  SpO2: 98% 96% 97% 97%  Weight:  106.7 kg    Height:  '5\' 9"'$  (1.753 m)      Intake/Output Summary (Last 24 hours) at 11/16/2021 1015 Last data filed at 11/16/2021 0700 Gross per 24 hour  Intake 720 ml  Output 300 ml  Net 420 ml      11/15/2021   10:39 PM 11/14/2021    3:46 PM 11/14/2021    9:33 AM  Last 3 Weights  Weight (lbs) 235 lb 3.7 oz 232 lb 9.4 oz 234 lb 5.6 oz  Weight (kg) 106.7 kg 105.5 kg 106.3 kg     Body mass index is 34.74 kg/m.  General:  Well nourished, well developed, in no acute distress HEENT: normal Neck: no JVD Vascular: No carotid bruits; Distal pulses 2+ bilaterally Cardiac:  irregular; no murmur  Lungs:  clear to auscultation bilaterally, no wheezing, rhonchi or rales  Abd: soft, nontender, no hepatomegaly  Ext: no edema Musculoskeletal:  No deformities, BUE and BLE strength normal and equal Skin: warm and dry  Neuro:  CNs 2-12 intact, no focal abnormalities noted Psych:  Normal affect   EKG:  The EKG was personally reviewed and demonstrates:  atrial fibrillation, PVCs Telemetry:  Telemetry was personally reviewed and demonstrates:  AF, PVCs  Relevant CV Studies: LHC 08/27/21 Mild nonobstructive CAD Normal LVEDP  TTE 08/25/21  1. Left ventricular ejection fraction, by estimation, is 45 to 50%. The  left ventricle has mildly decreased function. The left ventricle  demonstrates global hypokinesis. There is mild left ventricular  hypertrophy. Left ventricular diastolic parameters  are indeterminate.   2. Right ventricular systolic function is normal. The right ventricular  size is normal. There is normal pulmonary artery systolic pressure.   3. Left atrial size was severely dilated.   4. Right atrial size was moderately dilated.   5. The  mitral valve is normal in structure. Trivial mitral valve  regurgitation. No evidence of mitral stenosis.   6. The aortic valve is tricuspid. Aortic valve regurgitation is not  visualized. Aortic valve sclerosis is present, with no evidence of aortic  valve stenosis.   7. The inferior vena cava is dilated in size with >50% respiratory  variability, suggesting right atrial pressure of 8 mmHg.  Laboratory Data:  High Sensitivity Troponin:   Recent Labs  Lab 11/14/21 0935 11/14/21 0947 11/15/21 0523 11/15/21 1109  TROPONINIHS '7 7 5 5     '$ Chemistry Recent Labs  Lab 11/14/21 0935 11/14/21 0947 11/15/21 0523 11/16/21 0153  NA 135  --  135 137  K 3.5  --  3.8 3.9  CL 103  --  105 106  CO2 23  --  24 22  GLUCOSE 260*  --  154* 133*  BUN 19  --  15 11  CREATININE 0.92  --  0.58* 0.66  CALCIUM 8.7*  --  8.6* 8.9  MG  --  1.8 1.9 1.8  GFRNONAA >60  --  >60 >60  ANIONGAP 9  --  6 9    No results for input(s): "PROT", "ALBUMIN", "AST", "ALT", "ALKPHOS", "BILITOT" in the last 168 hours. Lipids  No results for input(s): "CHOL", "TRIG", "HDL", "LABVLDL", "LDLCALC", "CHOLHDL" in the last 168 hours.  Hematology Recent Labs  Lab 11/14/21 0935 11/15/21 0523 11/16/21 0153  WBC 9.2 5.5 6.1  RBC 4.43 4.28 4.36  HGB 13.7 13.1 13.7  HCT 41.1 40.4 40.3  MCV 92.8 94.4 92.4  MCH 30.9 30.6 31.4  MCHC 33.3 32.4 34.0  RDW 17.5* 17.4* 16.9*  PLT 106* 114* 118*   Thyroid No results for input(s): "TSH", "FREET4" in the last 168 hours.  BNPNo results for input(s): "BNP", "PROBNP" in the last 168 hours.  DDimer No results for input(s): "DDIMER" in the last 168 hours.   Radiology/Studies:  DG Chest 2 View  Result Date: 11/14/2021 CLINICAL DATA:  Chest pain with increased heart rate and chest tightness. Possible atrial fibrillation. History of diabetes. EXAM: CHEST - 2 VIEW COMPARISON:  Radiographs 11/09/2021 and 10/19/2021. FINDINGS: The heart size and mediastinal contours are stable with a  moderate size hiatal hernia. The lungs are clear. There is no pleural effusion or pneumothorax. No acute osseous findings. Previous cervical fusion. Telemetry leads overlie the chest. IMPRESSION: Stable chest.  No active cardiopulmonary process. Electronically Signed   By: Richardean Sale M.D.   On: 11/14/2021 10:22     Assessment and Plan:   Persistent atrial fibrillation: Currently on Eliquis 5 mg twice daily.  CHA2DS2-VASc of 2.  He is continued to have episodes of atrial fibrillation making him feel quite poorly.  He has not tried any antiarrhythmics.  His ejection fraction is mildly reduced, but he does not have coronary artery disease.  This could be a tachycardia mediated cardiomyopathy.  We Latoiya Maradiaga start him on flecainide.  We Dreydon Cardenas give him a 300 mg dose today to see if we can convert him to sinus rhythm followed by 100 mg twice daily.  If he does not convert to sinus rhythm on this large dose, Pammy Vesey likely need cardioversion. PVCs: Elevated burden at 16%.  Planning to start flecainide as above.   Risk Assessment/Risk Scores:          CHA2DS2-VASc Score = 3   This indicates a 3.2% annual risk of stroke. The patient's score is based upon: CHF History: 1 HTN History: 1 Diabetes History: 1 Stroke History: 0 Vascular Disease History: 0 Age Score: 0 Gender Score: 0         For questions or updates, please contact [facility] Please consult [email or web address] for contact info under    Signed, Madalyn Legner Meredith Leeds, MD  11/16/2021 10:15 AM

## 2021-11-16 NOTE — Plan of Care (Signed)
  Problem: Education: Goal: Knowledge of General Education information will improve Description: Including pain rating scale, medication(s)/side effects and non-pharmacologic comfort measures Outcome: Progressing   Problem: Health Behavior/Discharge Planning: Goal: Ability to manage health-related needs will improve Outcome: Progressing   Problem: Clinical Measurements: Goal: Ability to maintain clinical measurements within normal limits will improve Outcome: Progressing Goal: Will remain free from infection Outcome: Progressing Goal: Diagnostic test results will improve Outcome: Progressing Goal: Respiratory complications will improve Outcome: Progressing Goal: Cardiovascular complication will be avoided Outcome: Progressing   Problem: Activity: Goal: Risk for activity intolerance will decrease Outcome: Progressing   Problem: Nutrition: Goal: Adequate nutrition will be maintained Outcome: Progressing   Problem: Coping: Goal: Level of anxiety will decrease Outcome: Progressing   Problem: Elimination: Goal: Will not experience complications related to bowel motility Outcome: Progressing Goal: Will not experience complications related to urinary retention Outcome: Progressing   Problem: Pain Managment: Goal: General experience of comfort will improve Outcome: Progressing   Problem: Safety: Goal: Ability to remain free from injury will improve Outcome: Progressing   Problem: Skin Integrity: Goal: Risk for impaired skin integrity will decrease Outcome: Progressing   Problem: Education: Goal: Ability to describe self-care measures that may prevent or decrease complications (Diabetes Survival Skills Education) will improve Outcome: Progressing Goal: Individualized Educational Video(s) Outcome: Progressing   Problem: Coping: Goal: Ability to adjust to condition or change in health will improve Outcome: Progressing   Problem: Fluid Volume: Goal: Ability to  maintain a balanced intake and output will improve Outcome: Progressing   Problem: Health Behavior/Discharge Planning: Goal: Ability to identify and utilize available resources and services will improve Outcome: Progressing Goal: Ability to manage health-related needs will improve Outcome: Progressing   Problem: Metabolic: Goal: Ability to maintain appropriate glucose levels will improve Outcome: Progressing   Problem: Nutritional: Goal: Maintenance of adequate nutrition will improve Outcome: Progressing Goal: Progress toward achieving an optimal weight will improve Outcome: Progressing   Problem: Skin Integrity: Goal: Risk for impaired skin integrity will decrease Outcome: Progressing   Problem: Tissue Perfusion: Goal: Adequacy of tissue perfusion will improve Outcome: Progressing   Problem: Urinary Elimination: Goal: Signs and symptoms of infection will decrease Outcome: Progressing

## 2021-11-17 DIAGNOSIS — I4819 Other persistent atrial fibrillation: Secondary | ICD-10-CM | POA: Diagnosis not present

## 2021-11-17 DIAGNOSIS — I4891 Unspecified atrial fibrillation: Secondary | ICD-10-CM | POA: Diagnosis not present

## 2021-11-17 DIAGNOSIS — I493 Ventricular premature depolarization: Secondary | ICD-10-CM | POA: Diagnosis not present

## 2021-11-17 LAB — BASIC METABOLIC PANEL
Anion gap: 15 (ref 5–15)
BUN: 13 mg/dL (ref 6–20)
CO2: 23 mmol/L (ref 22–32)
Calcium: 9.2 mg/dL (ref 8.9–10.3)
Chloride: 101 mmol/L (ref 98–111)
Creatinine, Ser: 0.66 mg/dL (ref 0.61–1.24)
GFR, Estimated: >60 mL/min (ref >60)
Glucose, Bld: 188 mg/dL — ABNORMAL HIGH (ref 70–99)
Potassium: 4.2 mmol/L (ref 3.5–5.1)
Sodium: 139 mmol/L (ref 135–145)

## 2021-11-17 LAB — URINALYSIS, ROUTINE W REFLEX MICROSCOPIC
Bilirubin Urine: NEGATIVE
Glucose, UA: NEGATIVE mg/dL
Hgb urine dipstick: NEGATIVE
Ketones, ur: NEGATIVE mg/dL
Nitrite: NEGATIVE
Protein, ur: NEGATIVE mg/dL
Specific Gravity, Urine: 1.017 (ref 1.005–1.030)
pH: 6 (ref 5.0–8.0)

## 2021-11-17 LAB — GLUCOSE, CAPILLARY
Glucose-Capillary: 175 mg/dL — ABNORMAL HIGH (ref 70–99)
Glucose-Capillary: 168 mg/dL — ABNORMAL HIGH (ref 70–99)
Glucose-Capillary: 224 mg/dL — ABNORMAL HIGH (ref 70–99)
Glucose-Capillary: 118 mg/dL — ABNORMAL HIGH (ref 70–99)

## 2021-11-17 LAB — PROTIME-INR
INR: 1.1 (ref 0.8–1.2)
Prothrombin Time: 13.6 seconds (ref 11.4–15.2)

## 2021-11-17 MED ORDER — SODIUM CHLORIDE 0.9 % IV SOLN: INTRAVENOUS | Status: DC

## 2021-11-17 NOTE — Progress Notes (Signed)
Pt states he has been experiencing burning w/ urination all day. MD paged, awaiting response.

## 2021-11-17 NOTE — Plan of Care (Signed)
  Problem: Clinical Measurements: Goal: Respiratory complications will improve Outcome: Progressing   Problem: Activity: Goal: Risk for activity intolerance will decrease Outcome: Progressing   Problem: Coping: Goal: Level of anxiety will decrease Outcome: Progressing   Problem: Elimination: Goal: Will not experience complications related to bowel motility Outcome: Progressing Goal: Will not experience complications related to urinary retention Outcome: Progressing

## 2021-11-17 NOTE — Progress Notes (Signed)
Progress Note  Patient Name: Stephen Roberts Date of Encounter: 11/17/2021  Primary Cardiologist: Werner Lean, MD   Subjective   Breathing is OK in bed  No CP     COmplains of some dysuria   Says he was treated for a UTI but it is still burning    Inpatient Medications    Scheduled Meds:  apixaban  5 mg Oral BID   ferrous sulfate  325 mg Oral BID   flecainide  100 mg Oral Q12H   insulin aspart  0-15 Units Subcutaneous TID WC   insulin aspart  0-5 Units Subcutaneous QHS   metFORMIN  500 mg Oral Q breakfast   metoprolol succinate  100 mg Oral Daily   multivitamin with minerals  1 tablet Oral Daily   pantoprazole  40 mg Oral Daily   pravastatin  20 mg Oral Once per day on Mon Wed Fri   sodium chloride flush  3 mL Intravenous Q12H   vitamin B-12  1,000 mcg Oral Daily   Continuous Infusions:  sodium chloride     PRN Meds: sodium chloride, acetaminophen **OR** acetaminophen, ibuprofen, metoprolol tartrate, ondansetron **OR** ondansetron (ZOFRAN) IV, oxyCODONE-acetaminophen, sodium chloride flush   Vital Signs    Vitals:   11/16/21 1103 11/16/21 1941 11/16/21 2315 11/17/21 0336  BP: 100/74 101/84 110/74 97/77  Pulse:  80 76 67  Resp: 13 16 (!) 21 17  Temp:  98.3 F (36.8 C) (!) 97.5 F (36.4 C) 98.4 F (36.9 C)  TempSrc:  Oral Oral Oral  SpO2: 98% 97%    Weight:      Height:        Intake/Output Summary (Last 24 hours) at 11/17/2021 4196 Last data filed at 11/16/2021 1941 Gross per 24 hour  Intake 240 ml  Output 300 ml  Net -60 ml   Filed Weights   11/14/21 0933 11/14/21 1546 11/15/21 2239  Weight: 106.3 kg 105.5 kg 106.7 kg    Telemetry    AF with occasional PVC   Rates OK - Personally Reviewed  ECG    No new - Personally Reviewed  Physical Exam   Gen: no distress Neck: No JVD  Cardiac: Irreg irreg   NO S3     Respiratory: Clear to auscultation bilaterally, GI: Soft, nontender, non-distended  MS: No  edema;  moves all  extremities Integument: Skin feels warm Neuro:  At time of evaluation, alert and oriented to person/place/time/situation  Psych: Normal affect, patient feels frustrated   Labs    Chemistry Recent Labs  Lab 11/15/21 0523 11/16/21 0153 11/17/21 0420  NA 135 137 139  K 3.8 3.9 4.2  CL 105 106 101  CO2 '24 22 23  '$ GLUCOSE 154* 133* 188*  BUN '15 11 13  '$ CREATININE 0.58* 0.66 0.66  CALCIUM 8.6* 8.9 9.2  GFRNONAA >60 >60 >60  ANIONGAP '6 9 15     '$ Hematology Recent Labs  Lab 11/14/21 0935 11/15/21 0523 11/16/21 0153  WBC 9.2 5.5 6.1  RBC 4.43 4.28 4.36  HGB 13.7 13.1 13.7  HCT 41.1 40.4 40.3  MCV 92.8 94.4 92.4  MCH 30.9 30.6 31.4  MCHC 33.3 32.4 34.0  RDW 17.5* 17.4* 16.9*  PLT 106* 114* 118*    Cardiac EnzymesNo results for input(s): "TROPONINI" in the last 168 hours. No results for input(s): "TROPIPOC" in the last 168 hours.   BNPNo results for input(s): "BNP", "PROBNP" in the last 168 hours.   DDimer No results for  input(s): "DDIMER" in the last 168 hours.   Radiology    No results found.   Patient Profile     56 y.o. male with a history of AF RVR s/p Cardioversion x 2  Presents with afbi     Assessment & Plan    1 Persistent Atrial Fibrillation Patient very symptomatic Started on flecanide yesterday   300 then 100 bid  Still in afib today  Ate breakfast this am  Have put on schedule for cardioversion tomorrow at 2:30    Continue flecanide    2  HFmrEF   May be rate related   Follow over time    Volume status is OK  3  PVCs   Follow on flecanide  4  OSA   Pt says he cannot tolerate CPAP   WIll msg T Turner to see if other options   5  HL   On pravstatin   Normal coronary arteries      Continue   6  Hx urosepsis    Occurred after SGLT2i    Still with burning    Will recheck urinalysis   ON Ceftriaxone  Sensitive to it     For questions or updates, please contact [facility] Please consult [email or web address] for contact info under  Cardiology/STEMI.      Rudean Haskell, MD [address] [place], [place] [address] [phone]  7:33 AM

## 2021-11-17 NOTE — Progress Notes (Addendum)
Progress Note  Patient Name: Stephen Roberts Date of Encounter: 11/17/2021  [facility] Cardiologist: Werner Lean, MD   Subjective   Feeling improved, not perfect Last night again with painful urination  Inpatient Medications    Scheduled Meds:  apixaban  5 mg Oral BID   ferrous sulfate  325 mg Oral BID   flecainide  100 mg Oral Q12H   insulin aspart  0-15 Units Subcutaneous TID WC   insulin aspart  0-5 Units Subcutaneous QHS   metFORMIN  500 mg Oral Q breakfast   metoprolol succinate  100 mg Oral Daily   multivitamin with minerals  1 tablet Oral Daily   pantoprazole  40 mg Oral Daily   pravastatin  20 mg Oral Once per day on Mon Wed Fri   sodium chloride flush  3 mL Intravenous Q12H   vitamin B-12  1,000 mcg Oral Daily   Continuous Infusions:  sodium chloride     PRN Meds: sodium chloride, acetaminophen **OR** acetaminophen, ibuprofen, metoprolol tartrate, ondansetron **OR** ondansetron (ZOFRAN) IV, oxyCODONE-acetaminophen, sodium chloride flush   Vital Signs    Vitals:   11/16/21 1941 11/16/21 2315 11/17/21 0336 11/17/21 0749  BP: 101/84 1'10/74 97/77 92/68 '$  Pulse: 80 76 67 83  Resp: 16 (!) '21 17 14  '$ Temp: 98.3 F (36.8 C) (!) 97.5 F (36.4 C) 98.4 F (36.9 C) 98.4 F (36.9 C)  TempSrc: Oral Oral Oral Oral  SpO2: 97%   97%  Weight:      Height:        Intake/Output Summary (Last 24 hours) at 11/17/2021 0845 Last data filed at 11/16/2021 1941 Gross per 24 hour  Intake 240 ml  Output 300 ml  Net -60 ml      11/15/2021   10:39 PM 11/14/2021    3:46 PM 11/14/2021    9:33 AM  Last 3 Weights  Weight (lbs) 235 lb 3.7 oz 232 lb 9.4 oz 234 lb 5.6 oz  Weight (kg) 106.7 kg 105.5 kg 106.3 kg      Telemetry    Afib 80's, intermittently PVCs are increased in frequency, rates to 110s or so with activity - Personally Reviewed  ECG    No new EKGs - Personally Reviewed  Physical Exam   GEN: No acute distress.   Neck: No JVD Cardiac: irreg-irreg,  no murmurs, rubs, or gallops.  Respiratory: Clear to auscultation bilaterally. GI: Soft, nontender, non-distended  MS: No edema; No deformity. Neuro:  Nonfocal  Psych: Normal affect   Labs    High Sensitivity Troponin:   Recent Labs  Lab 11/14/21 0935 11/14/21 0947 11/15/21 0523 11/15/21 1109  TROPONINIHS '7 7 5 5     '$ Chemistry Recent Labs  Lab 11/14/21 0947 11/15/21 0523 11/16/21 0153 11/17/21 0420  NA  --  135 137 139  K  --  3.8 3.9 4.2  CL  --  105 106 101  CO2  --  '24 22 23  '$ GLUCOSE  --  154* 133* 188*  BUN  --  '15 11 13  '$ CREATININE  --  0.58* 0.66 0.66  CALCIUM  --  8.6* 8.9 9.2  MG 1.8 1.9 1.8  --   GFRNONAA  --  >60 >60 >60  ANIONGAP  --  '6 9 15    '$ Lipids No results for input(s): "CHOL", "TRIG", "HDL", "LABVLDL", "LDLCALC", "CHOLHDL" in the last 168 hours.  Hematology Recent Labs  Lab 11/14/21 0935 11/15/21 0523 11/16/21 0153  WBC 9.2  5.5 6.1  RBC 4.43 4.28 4.36  HGB 13.7 13.1 13.7  HCT 41.1 40.4 40.3  MCV 92.8 94.4 92.4  MCH 30.9 30.6 31.4  MCHC 33.3 32.4 34.0  RDW 17.5* 17.4* 16.9*  PLT 106* 114* 118*   Thyroid No results for input(s): "TSH", "FREET4" in the last 168 hours.  BNPNo results for input(s): "BNP", "PROBNP" in the last 168 hours.  DDimer No results for input(s): "DDIMER" in the last 168 hours.   Radiology    No results found.  Cardiac Studies   LHC 08/27/21 Mild nonobstructive CAD Normal LVEDP   TTE 08/25/21  1. Left ventricular ejection fraction, by estimation, is 45 to 50%. The  left ventricle has mildly decreased function. The left ventricle  demonstrates global hypokinesis. There is mild left ventricular  hypertrophy. Left ventricular diastolic parameters  are indeterminate.   2. Right ventricular systolic function is normal. The right ventricular  size is normal. There is normal pulmonary artery systolic pressure.   3. Left atrial size was severely dilated.   4. Right atrial size was moderately dilated.   5. The  mitral valve is normal in structure. Trivial mitral valve  regurgitation. No evidence of mitral stenosis.   6. The aortic valve is tricuspid. Aortic valve regurgitation is not  visualized. Aortic valve sclerosis is present, with no evidence of aortic  valve stenosis.   7. The inferior vena cava is dilated in size with >50% respiratory  variability, suggesting right atrial pressure of 8 mmHg.  Patient Profile     56 y.o. male w/PMHx of DM, GERD, PVCs, and fairly new AFib admitted initially to [facility] with recurrent CP, palpitations and AFib w/RVR  Assessment & Plan    Persistent AFib CHA2DS2Vasc is 2, on Eliquis Very symptomatic Rates are improved,and he is feeling better  PVCs 16% burden by monitoring  NICM (mild) Possibly/probably 2/2 RVR and or PVCs Not volume OL    Started on flecainide yesterday w/Toprol '300mg'$  did not convert  Cards team has him scheduled for DCCV tomorrow, the patient is comfortable with that plan. He confirms with me no missed Eliquis doses at home (ever)  4. OSA I have reached out to the office to get him f/u with Dr.Turner to see is he is a candidate for Inspire or other treatment options  5. UTI C/w IM   For questions or updates, please contact [place] Please consult [email or web address] for contact info under        Signed, Baldwin Jamaica, PA-C  11/17/2021, 8:45 AM    I have seen and examined this patient with Tommye Standard.  Agree with above, note added to reflect my findings.  Feeling improved on flecainide.  Continues to have pain and burning with urination.  GEN: Well nourished, well developed, in no acute distress  HEENT: normal  Neck: no JVD, carotid bruits, or masses Cardiac: Irregular; no murmurs, rubs, or gallops,no edema  Respiratory:  clear to auscultation bilaterally, normal work of breathing GI: soft, nontender, nondistended, + BS MS: no deformity or atrophy  Skin: warm and dry Neuro:  Strength and sensation are  intact Psych: euthymic mood, full affect   Persistent atrial fibrillation: Patient was started on flecainide yesterday.  He has not converted to sinus rhythm.  We Arelys Glassco plan for cardioversion tomorrow. PVCs: 16% burden by cardiac monitor.  Salvador Bigbee need reassessment now that he is on flecainide. Nonischemic cardiomyopathy: Potentially due to PVCs or rapid atrial fibrillation.  No obvious volume overload. Urinary  tract infection: Plan per primary team.  Alizzon Dioguardi M. Allessandra Bernardi MD 11/17/2021 10:28 AM

## 2021-11-18 ENCOUNTER — Encounter (HOSPITAL_COMMUNITY): Payer: Self-pay | Admitting: Internal Medicine

## 2021-11-18 ENCOUNTER — Inpatient Hospital Stay (HOSPITAL_COMMUNITY): Payer: PPO | Admitting: Anesthesiology

## 2021-11-18 ENCOUNTER — Encounter (HOSPITAL_COMMUNITY)
Admission: EM | Disposition: A | Discharge status: Home / Self Care | Payer: Self-pay | Source: Home / Self Care | Attending: Internal Medicine

## 2021-11-18 DIAGNOSIS — I4891 Unspecified atrial fibrillation: Secondary | ICD-10-CM

## 2021-11-18 DIAGNOSIS — E119 Type 2 diabetes mellitus without complications: Secondary | ICD-10-CM | POA: Diagnosis not present

## 2021-11-18 HISTORY — PX: CARDIOVERSION: SHX1299

## 2021-11-18 LAB — GLUCOSE, CAPILLARY
Glucose-Capillary: 146 mg/dL — ABNORMAL HIGH (ref 70–99)
Glucose-Capillary: 106 mg/dL — ABNORMAL HIGH (ref 70–99)
Glucose-Capillary: 192 mg/dL — ABNORMAL HIGH (ref 70–99)

## 2021-11-18 SURGERY — CARDIOVERSION
Anesthesia: General

## 2021-11-18 MED ORDER — SULFAMETHOXAZOLE-TRIMETHOPRIM 800-160 MG PO TABS: 1.0000 | ORAL_TABLET | Freq: Two times a day (BID) | ORAL | 0 refills | Status: DC

## 2021-11-18 MED ORDER — FLECAINIDE ACETATE 100 MG PO TABS
100.0000 mg | ORAL_TABLET | Freq: Two times a day (BID) | ORAL | 3 refills | Status: DC
Start: 2021-11-18 — End: 2022-02-16

## 2021-11-18 MED ORDER — ORAL CARE MOUTH RINSE
15.0000 mL | OROMUCOSAL | Status: DC | PRN
Start: 2021-11-18 — End: 2021-11-19

## 2021-11-18 MED ORDER — METOPROLOL SUCCINATE ER 100 MG PO TB24: 100.0000 mg | ORAL_TABLET | Freq: Every day | ORAL | 6 refills | Status: DC

## 2021-11-18 MED ORDER — PROPOFOL 10 MG/ML IV BOLUS
INTRAVENOUS | Status: DC | PRN
  Administered 2021-11-18: 20 mg via INTRAVENOUS
  Administered 2021-11-18: 50 mg via INTRAVENOUS
  Administered 2021-11-18: 10 mg via INTRAVENOUS

## 2021-11-18 MED ORDER — SODIUM CHLORIDE 0.9 % IV SOLN
INTRAVENOUS | Status: AC | PRN
  Administered 2021-11-18: 500 mL via INTRAVENOUS

## 2021-11-18 NOTE — Progress Notes (Signed)
Telemetry reviewed Afib  60's with PVCs PVC burden remains though less couplets  Planned for DCCV today Continue flecainide '100mg'$  BID Will make Afib clinic follow up for next week for EKG and evaluation. OK to discharge from EP perspective when ready medically otherwise. Dr. Curt Bears has d/w Dr. Melvyn Neth cardiology team will continue to follow Tommye Standard, PA-C

## 2021-11-18 NOTE — Transfer of Care (Signed)
Immediate Anesthesia Transfer of Care Note  Patient: Stephen Roberts  Procedure(s) Performed: CARDIOVERSION  Patient Location: PACU and Endoscopy Unit  Anesthesia Type:General  Level of Consciousness: drowsy  Airway & Oxygen Therapy: Patient Spontanous Breathing  Post-op Assessment: Report given to RN and Post -op Vital signs reviewed and stable  Post vital signs: Reviewed and stable  Last Vitals:  Vitals Value Taken Time  BP    Temp    Pulse    Resp    SpO2      Last Pain:  Vitals:   11/18/21 1150  TempSrc: Temporal  PainSc: 0-No pain         Complications: No notable events documented.

## 2021-11-18 NOTE — Discharge Summary (Signed)
Discharge Summary    Patient ID: Stephen Roberts MRN: [id number]; DOB: 07-May-1966  Admit date: 11/14/2021 Discharge date: 11/18/2021  PCP:  Lindell Spar, MD   [facility] Providers Cardiologist:  Werner Lean, MD        Discharge Diagnoses    Principal Problem:   Atrial fibrillation with RVR  Active Problems:   Type 2 diabetes mellitus with other specified complication (HCC)   GERD (gastroesophageal reflux disease)   Essential hypertension   Iron deficiency anemia   Persistent atrial fibrillation (HCC)   Sepsis secondary to UTI [facility])    Diagnostic Studies/Procedures    Procedure: Electrical Cardioversion Indications:  Atrial Fibrillation   Time Out: Verified patient identification, verified procedure,medications/allergies/relevent history reviewed, required imaging and test results available was performed.   Procedure Details   The patient signed informed consent.   The patient was NPO past midnight. Has had therapeutic anticoagulation with Eliquis greater than 3 weeks. The patient denies any interruption of anticoagulation.  Anesthesia was administered by Dr. Ermalene Postin.  Adequate airway was maintained throughout and vital followed per protocol.  He was cardioverted x 1 with 200 J of biphasic synchronized energy.  He converted to NSR.  There were no apparent complications.  The patient tolerated the procedure well and had normal neuro status and respiratory status post procedure with vitals stable as recorded elsewhere.       IMPRESSION:   Successful cardioversion of atrial fibrillation to sinus rhythm heart rate 68 bpm.     Follow up: Transfer back to medical floors.  He will continue on current medical therapy.  The patient advised to continue anticoagulation.   Kardie Tobb 11/18/2021, 12:56 PM _____________   History of Present Illness     Stephen Roberts is a 56 y.o. male with medical history significant for atrial fibrillation on Eliquis, type 2  diabetes, iron deficiency anemia, hypertension, OSA, tobacco abuse, GERD, dyslipidemia, and obesity.   [facility] to [facility] [place] on 7/01 with urinary frequency and hesitancy as well as pain.  He was diagnosed with UTI, given Rocephin IM x1 and prescribed a 10-day course of Bactrim p.o.  However, he never got the prescription filled.  On 07/02, he called the on-call PA/NP reporting a heart rate of 170.  He was severely symptomatic with this.  He was instructed to come to the ER.   Hospital Course     Consultants: IM  He was initially admitted by IM at [facility].  It was noted that he never got the Bactrim prescription filled.  He had been started on Farxiga recently, but this was discontinued because of the possibility was contributing to his UTI.  Urine culture performed which showed greater than 100,000 colonies of E. coli, sensitive to all tested medications.  He got IV Rocephin 1 g on 07/02-07/04, a total of 4 doses.  He will be on Bactrim for 7 days at discharge.  He was started on IV Cardizem for rate control, with improvement in his heart rate.  He was also given IV metoprolol as needed for heart rate control, which also helped.  The IV Cardizem was discontinued and his care was focused on beta-blockers.  He was seen by Dr Johnsie Cancel up [place] and transferred to [facility] to our service.  He was continued on Eliquis 5 mg twice daily, no doses missed.  A recent monitor had showed 16% PVC burden, asymptomatic.  He has a history of cardiomyopathy with EF  45-50% 08/2021, no significant CAD at cath.  This is felt likely to his atrial fibrillation, no volume overload on exam.  After transfer to [facility], he was seen by EP.  She is felt to have persistent atrial fibrillation, complicated by HFmrEF, rare PVCs and OSA with CPAP intolerance.  Toprol-XL was uptitrated to 100 mg daily, no BP room for ARB at this point.  He was felt to be a candidate for antiarrhythmic, and was started on  flecainide.  He was initially given a 300 mg dose to see if we could convert him, but he did not convert.  He was started on flecainide 100 mg twice daily.  On the higher dose of beta-blocker, his heart rate improved.  However, since he did not improve, he was scheduled for cardioversion.  On 11/18/2021, he had cardioversion with 200 J of biphasic synchronized energy.  He converted to normal sinus rhythm.  There were no complications.  Post cardioversion, he is doing well.  He is to continue the flecainide at 100 mg twice daily and Toprol-XL at 100 mg daily.  He is to continue the Eliquis 5 mg twice daily and it was emphasized that he should not miss doses.  He is doing well post cardioversion, and no further inpatient work-up is indicated.  He is considered stable for discharge, to follow-up as an outpatient.  Did the patient have an acute coronary syndrome (MI, NSTEMI, STEMI, etc) this admission?:  No                               Did the patient have a percutaneous coronary intervention (stent / angioplasty)?:  No.     _____________  Discharge Vitals Blood pressure 97/64, pulse 69, temperature 97.8 F (36.6 C), temperature source Oral, resp. rate 16, height '5\' 9"'$  (1.753 m), weight 106.7 kg, SpO2 96 %.  Filed Weights   11/14/21 0933 11/14/21 1546 11/15/21 2239  Weight: 106.3 kg 105.5 kg 106.7 kg    Labs & Radiologic Studies    CBC Recent Labs    11/16/21 0153  WBC 6.1  HGB 13.7  HCT 40.3  MCV 92.4  PLT 485*   Basic Metabolic Panel Recent Labs    11/16/21 0153 11/17/21 0420  NA 137 139  K 3.9 4.2  CL 106 101  CO2 22 23  GLUCOSE 133* 188*  BUN 11 13  CREATININE 0.66 0.66  CALCIUM 8.9 9.2  MG 1.8  --    Liver Function Tests No results for input(s): "AST", "ALT", "ALKPHOS", "BILITOT", "PROT", "ALBUMIN" in the last 72 hours. No results for input(s): "LIPASE", "AMYLASE" in the last 72 hours. High Sensitivity Troponin:   Recent Labs  Lab 11/14/21 0935 11/14/21 0947  11/15/21 0523 11/15/21 1109  TROPONINIHS '7 7 5 5    '$ BNP Invalid input(s): "POCBNP" D-Dimer No results for input(s): "DDIMER" in the last 72 hours. Hemoglobin A1C No results for input(s): "HGBA1C" in the last 72 hours. Fasting Lipid Panel No results for input(s): "CHOL", "HDL", "LDLCALC", "TRIG", "CHOLHDL", "LDLDIRECT" in the last 72 hours. Thyroid Function Tests No results for input(s): "TSH", "T4TOTAL", "T3FREE", "THYROIDAB" in the last 72 hours.  Invalid input(s): "FREET3" _____________  LONG TERM MONITOR (3-14 DAYS)  Result Date: 11/14/2021  Two monitors.  Patient had a minimum heart rate of 47 bpm, maximum heart rate of 193 bpm, and average heart rate of 83 bpm.  Predominant underlying rhythm was sinus rhythym.  Paroxsymal SVT, 31 episodes lasting 18 secondsat longest with a max rate of 187 bpm at fastest.  NSVT, 9 episodes lasting 4 beats at longest with a max rate of 193 bpm at fastest.  Isolated PACs were rare (<1.0%).  Isolated PVCs were frequent (16%).  Triggered and diary events associated with sinus rhythm, sinus tachycardia, PACs, and PVCs.  Frequent PVCs   DG Chest 2 View  Result Date: 11/14/2021 CLINICAL DATA:  Chest pain with increased heart rate and chest tightness. Possible atrial fibrillation. History of diabetes. EXAM: CHEST - 2 VIEW COMPARISON:  Radiographs 11/09/2021 and 10/19/2021. FINDINGS: The heart size and mediastinal contours are stable with a moderate size hiatal hernia. The lungs are clear. There is no pleural effusion or pneumothorax. No acute osseous findings. Previous cervical fusion. Telemetry leads overlie the chest. IMPRESSION: Stable chest.  No active cardiopulmonary process. Electronically Signed   By: Richardean Sale M.D.   On: 11/14/2021 10:22   DG Chest 2 View  Result Date: 11/09/2021 CLINICAL DATA:  Cardiac arrhythmia, chest pain EXAM: CHEST - 2 VIEW COMPARISON:  10/19/2021 FINDINGS: The heart size and mediastinal contours are within normal  limits. Both lungs are clear. There is previous surgical fusion in the cervical spine. IMPRESSION: No active cardiopulmonary disease. Electronically Signed   By: Elmer Picker M.D.   On: 11/09/2021 15:31    Disposition   Pt is being discharged home today in good condition.  Follow-up Plans & Appointments     Follow-up Information      [facility] Follow up.   Specialty: Cardiology Why: 11/24/21 @ 2:30PM Contact information: [address] [phone] [name] [phone]               Discharge Instructions     Diet - low sodium heart healthy   Complete by: As directed    Increase activity slowly   Complete by: As directed        Discharge Medications   Allergies as of 11/18/2021       Reactions   Gabapentin Shortness Of Breath   Lyrica [pregabalin] Shortness Of Breath   Crestor [rosuvastatin] Other (See Comments)   myalgia   Farxiga [dapagliflozin]    Developed UTI        Medication List     STOP taking these medications    dapagliflozin propanediol 10 MG Tabs tablet Commonly known as: Farxiga   ibuprofen 200 MG tablet Commonly known as: ADVIL   losartan 25 MG tablet Commonly known as: Cozaar       TAKE these medications    acetaminophen 650 MG CR tablet Commonly known as: TYLENOL Take 1,300 mg by mouth every 8 (eight) hours as needed for pain.   albuterol 108 (90 Base) MCG/ACT inhaler Commonly known as: VENTOLIN HFA INHALE 2 PUFFS INTO THE LUNGS EVERY 6 HOURS AS NEEDED FOR WHEEZING OR SHORTNESS OF BREATH   apixaban 5 MG Tabs tablet Commonly known as: ELIQUIS Take 1 tablet (5 mg total) by mouth 2 (two) times daily.   esomeprazole 40 MG capsule Commonly known as: NexIUM Take 1 capsule (40 mg total) by mouth daily.   ferrous sulfate 325 (65 FE) MG EC tablet Take 1 tablet (325 mg total) by mouth 2 (two) times daily.   flecainide 100 MG tablet Commonly known as:  TAMBOCOR Take 1 tablet (100 mg total) by mouth every 12 (twelve) hours.   furosemide 20 MG tablet Commonly known as: LASIX Take 1  tablet (20 mg total) by mouth daily.   metFORMIN 500 MG tablet Commonly known as: Glucophage Take 1 tablet (500 mg total) by mouth 2 (two) times daily with a meal. What changed: when to take this   metoprolol succinate 100 MG 24 hr tablet Commonly known as: TOPROL-XL Take 1 tablet (100 mg total) by mouth daily. What changed:  medication strength how much to take   multivitamin tablet Take 1 tablet by mouth daily.   oxyCODONE-acetaminophen 5-325 MG tablet Commonly known as: PERCOCET/ROXICET Take 1 tablet by mouth every 8 (eight) hours as needed for severe pain.   potassium chloride 10 MEQ tablet Commonly known as: KLOR-CON M Take 1 tablet (10 mEq total) by mouth daily.   pravastatin 20 MG tablet Commonly known as: Pravachol Take one tablet three times per week.   Probiotic Colic Liqd Take by mouth.   sulfamethoxazole-trimethoprim 800-160 MG tablet Commonly known as: BACTRIM DS Take 1 tablet by mouth 2 (two) times daily.   Turmeric 500 MG Caps Take 500 mg by mouth daily.   vitamin B-12 1000 MCG tablet Commonly known as: CYANOCOBALAMIN Take 1,000 mcg by mouth daily.   VITAMIN C PO Take 500 mg by mouth daily.           Outstanding Labs/Studies   None  Duration of Discharge Encounter   Greater than 30 minutes including physician time.  Signed, Rosaria Ferries, PA-C 11/18/2021, 6:11 PM

## 2021-11-18 NOTE — CV Procedure (Signed)
   Electrical Cardioversion Procedure Note Stephen Roberts [id number] [birth year]  Procedure: Electrical Cardioversion Indications:  Atrial Fibrillation  Time Out: Verified patient identification, verified procedure,medications/allergies/relevent history reviewed, required imaging and test results available was performed.  Procedure Details  The patient signed informed consent.   The patient was NPO past midnight. Has had therapeutic anticoagulation with Eliquis greater than 3 weeks. The patient denies any interruption of anticoagulation.  Anesthesia was administered by Dr. Ermalene Postin.  Adequate airway was maintained throughout and vital followed per protocol.  He was cardioverted x 1 with 200 J of biphasic synchronized energy.  He converted to NSR.  There were no apparent complications.  The patient tolerated the procedure well and had normal neuro status and respiratory status post procedure with vitals stable as recorded elsewhere.     IMPRESSION:  Successful cardioversion of atrial fibrillation to sinus rhythm heart rate 68 bpm.   Follow up: Transfer back to medical floors.  He will continue on current medical therapy.  The patient advised to continue anticoagulation.  Stephen Roberts 11/18/2021, 12:56 PM

## 2021-11-18 NOTE — Anesthesia Preprocedure Evaluation (Addendum)
Anesthesia Evaluation  Patient identified by MRN, date of birth, ID band Patient awake    Reviewed: Allergy & Precautions, NPO status , Patient's Chart, lab work & pertinent test results  History of Anesthesia Complications Negative for: history of anesthetic complications  Airway Mallampati: III  TM Distance: >3 FB Neck ROM: Full    Dental  (+) Dental Advisory Given   Pulmonary COPD, Current Smoker and Patient abstained from smoking.,    breath sounds clear to auscultation       Cardiovascular hypertension, Pt. on medications and Pt. on home beta blockers + dysrhythmias Atrial Fibrillation + Valvular Problems/Murmurs  Rhythm:Irregular  1. Left ventricular ejection fraction, by estimation, is 45 to 50%. The  left ventricle has mildly decreased function. The left ventricle  demonstrates global hypokinesis. There is mild left ventricular  hypertrophy. Left ventricular diastolic parameters  are indeterminate.  2. Right ventricular systolic function is normal. The right ventricular  size is normal. There is normal pulmonary artery systolic pressure.  3. Left atrial size was severely dilated.  4. Right atrial size was moderately dilated.  5. The mitral valve is normal in structure. Trivial mitral valve  regurgitation. No evidence of mitral stenosis.  6. The aortic valve is tricuspid. Aortic valve regurgitation is not  visualized. Aortic valve sclerosis is present, with no evidence of aortic  valve stenosis.  7. The inferior vena cava is dilated in size with >50% respiratory  variability, suggesting right atrial pressure of 8 mmHg.   Neuro/Psych  Neuromuscular disease negative psych ROS   GI/Hepatic Neg liver ROS, GERD  ,  Endo/Other  diabetes  Renal/GU negative Renal ROS     Musculoskeletal   Abdominal   Peds  Hematology  (+) Blood dyscrasia, , eliquis  Lab Results      Component                Value                Date                      WBC                      6.1                 11/16/2021                HGB                      13.7                11/16/2021                HCT                      40.3                11/16/2021                MCV                      92.4                11/16/2021                PLT  118 (L)             11/16/2021              Anesthesia Other Findings   Reproductive/Obstetrics                            Anesthesia Physical Anesthesia Plan  ASA: 3  Anesthesia Plan: General   Post-op Pain Management: Minimal or no pain anticipated   Induction: Intravenous  PONV Risk Score and Plan: 1 and Treatment may vary due to age or medical condition  Airway Management Planned: Mask  Additional Equipment: None  Intra-op Plan:   Post-operative Plan:   Informed Consent: I have reviewed the patients History and Physical, chart, labs and discussed the procedure including the risks, benefits and alternatives for the proposed anesthesia with the patient or authorized representative who has indicated his/her understanding and acceptance.     Dental advisory given  Plan Discussed with: CRNA  Anesthesia Plan Comments:         Anesthesia Quick Evaluation

## 2021-11-18 NOTE — Progress Notes (Signed)
Discharge instructions given to patient. Awaiting  transportation home.

## 2021-11-18 NOTE — Care Management Important Message (Signed)
Important Message  Patient Details  Name: Stephen Roberts MRN: [id number] Date of Birth: August 03, 1965   Medicare Important Message Given:  Yes     Orbie Pyo 11/18/2021, 3:07 PM

## 2021-11-18 NOTE — Progress Notes (Signed)
Progress Note  Patient Name: Stephen Roberts Date of Encounter: 11/18/2021  Primary Cardiologist: Werner Lean, MD   Subjective   Patient seen and examined at his bedside in endo.  Inpatient Medications    Scheduled Meds:  [MAR Hold] apixaban  5 mg Oral BID   [MAR Hold] ferrous sulfate  325 mg Oral BID   [MAR Hold] flecainide  100 mg Oral Q12H   [MAR Hold] insulin aspart  0-15 Units Subcutaneous TID WC   [MAR Hold] insulin aspart  0-5 Units Subcutaneous QHS   [MAR Hold] metFORMIN  500 mg Oral Q breakfast   [MAR Hold] metoprolol succinate  100 mg Oral Daily   [MAR Hold] multivitamin with minerals  1 tablet Oral Daily   [MAR Hold] pantoprazole  40 mg Oral Daily   [MAR Hold] pravastatin  20 mg Oral Once per day on Mon Wed Fri   [facility] Hold] sodium chloride flush  3 mL Intravenous Q12H   [MAR Hold] vitamin B-12  1,000 mcg Oral Daily   Continuous Infusions:  [MAR Hold] sodium chloride     sodium chloride 20 mL/hr at 11/18/21 0342   sodium chloride     PRN Meds: [MAR Hold] sodium chloride, sodium chloride, [MAR Hold] acetaminophen **OR** [MAR Hold] acetaminophen, [MAR Hold] ibuprofen, [MAR Hold] metoprolol tartrate, [MAR Hold] ondansetron **OR** [MAR Hold] ondansetron (ZOFRAN) IV, [MAR Hold] mouth rinse, [MAR Hold] oxyCODONE-acetaminophen, [MAR Hold] sodium chloride flush   Vital Signs    Vitals:   11/18/21 0749 11/18/21 1049 11/18/21 1137 11/18/21 1150  BP: 106/80 94/72  119/86  Pulse:    (!) 58  Resp: '14 20  15  '$ Temp: 98.3 F (36.8 C) 98 F (36.7 C)  98.4 F (36.9 C)  TempSrc: Oral Oral  Temporal  SpO2: 97% 97% 97% 98%  Weight:      Height:        Intake/Output Summary (Last 24 hours) at 11/18/2021 1231 Last data filed at 11/18/2021 0342 Gross per 24 hour  Intake 377.41 ml  Output --  Net 377.41 ml   Filed Weights   11/14/21 0933 11/14/21 1546 11/15/21 2239  Weight: 106.3 kg 105.5 kg 106.7 kg    Telemetry    Atrial fib - Personally Reviewed  ECG      - Personally Reviewed  Physical Exam     General: Comfortable, Head: Atraumatic, normal size  Eyes: PEERLA, EOMI  Neck: Supple, normal JVD Cardiac: Normal S1, S2; RRR; no murmurs, rubs, or gallops Lungs: Clear to auscultation bilaterally Abd: Soft, nontender, no hepatomegaly  Ext: warm, no edema Musculoskeletal: No deformities, BUE and BLE strength normal and equal Skin: Warm and dry, no rashes   Neuro: Alert and oriented to person, place, time, and situation, CNII-XII grossly intact, no focal deficits  Psych: Normal mood and affect   Labs    Chemistry Recent Labs  Lab 11/15/21 0523 11/16/21 0153 11/17/21 0420  NA 135 137 139  K 3.8 3.9 4.2  CL 105 106 101  CO2 '24 22 23  '$ GLUCOSE 154* 133* 188*  BUN '15 11 13  '$ CREATININE 0.58* 0.66 0.66  CALCIUM 8.6* 8.9 9.2  GFRNONAA >60 >60 >60  ANIONGAP '6 9 15     '$ Hematology Recent Labs  Lab 11/14/21 0935 11/15/21 0523 11/16/21 0153  WBC 9.2 5.5 6.1  RBC 4.43 4.28 4.36  HGB 13.7 13.1 13.7  HCT 41.1 40.4 40.3  MCV 92.8 94.4 92.4  MCH 30.9 30.6 31.4  MCHC  33.3 32.4 34.0  RDW 17.5* 17.4* 16.9*  PLT 106* 114* 118*    Cardiac EnzymesNo results for input(s): "TROPONINI" in the last 168 hours. No results for input(s): "TROPIPOC" in the last 168 hours.   BNPNo results for input(s): "BNP", "PROBNP" in the last 168 hours.   DDimer No results for input(s): "DDIMER" in the last 168 hours.   Radiology    No results found.  Cardiac Studies  TTE 08/25/2021 Sonographer Comments: Image acquisition challenging due to COPD.  IMPRESSIONS     1. Left ventricular ejection fraction, by estimation, is 45 to 50%. The  left ventricle has mildly decreased function. The left ventricle  demonstrates global hypokinesis. There is mild left ventricular  hypertrophy. Left ventricular diastolic parameters  are indeterminate.   2. Right ventricular systolic function is normal. The right ventricular  size is normal. There is normal  pulmonary artery systolic pressure.   3. Left atrial size was severely dilated.   4. Right atrial size was moderately dilated.   5. The mitral valve is normal in structure. Trivial mitral valve  regurgitation. No evidence of mitral stenosis.   6. The aortic valve is tricuspid. Aortic valve regurgitation is not  visualized. Aortic valve sclerosis is present, with no evidence of aortic  valve stenosis.   7. The inferior vena cava is dilated in size with >50% respiratory  variability, suggesting right atrial pressure of 8 mmHg.   Comparison(s): LVEF is slight worse, LA size has increased.   FINDINGS   Left Ventricle: Left ventricular ejection fraction, by estimation, is 45  to 50%. The left ventricle has mildly decreased function. The left  ventricle demonstrates global hypokinesis. The left ventricular internal  cavity size was normal in size. There is   mild left ventricular hypertrophy. Left ventricular diastolic function  could not be evaluated due to atrial fibrillation. Left ventricular  diastolic parameters are indeterminate.   Right Ventricle: The right ventricular size is normal. No increase in  right ventricular wall thickness. Right ventricular systolic function is  normal. There is normal pulmonary artery systolic pressure. The tricuspid  regurgitant velocity is 2.52 m/s, and   with an assumed right atrial pressure of 8 mmHg, the estimated right  ventricular systolic pressure is 70.9 mmHg.   Left Atrium: Left atrial size was severely dilated.   Right Atrium: Right atrial size was moderately dilated.   Pericardium: There is no evidence of pericardial effusion.   Mitral Valve: The mitral valve is normal in structure. Trivial mitral  valve regurgitation. No evidence of mitral valve stenosis. MV peak  gradient, 4.7 mmHg. The mean mitral valve gradient is 1.5 mmHg.   Tricuspid Valve: The tricuspid valve is normal in structure. Tricuspid  valve regurgitation is trivial. No  evidence of tricuspid stenosis.   Aortic Valve: The aortic valve is tricuspid. Aortic valve regurgitation is  not visualized. Aortic valve sclerosis is present, with no evidence of  aortic valve stenosis. Aortic valve mean gradient measures 6.0 mmHg.  Aortic valve peak gradient measures 9.5   mmHg. Aortic valve area, by VTI measures 2.15 cm.   Pulmonic Valve: The pulmonic valve was not well visualized. Pulmonic valve  regurgitation is not visualized. No evidence of pulmonic stenosis.   Aorta: The aortic root and ascending aorta are structurally normal, with  no evidence of dilitation.   Venous: The inferior vena cava is dilated in size with greater than 50%  respiratory variability, suggesting right atrial pressure of 8 mmHg.  IAS/Shunts: No atrial level shunt detected by color flow Doppler.    Patient Profile     56 y.o. male with atrial fibrillation  Assessment & Plan    Atrial fibrillation - here for cardioversion.   Shared Decision Making/Informed Consent The risks (stroke, cardiac arrhythmias rarely resulting in the need for a temporary or permanent pacemaker, skin irritation or burns and complications associated with conscious sedation including aspiration, arrhythmia, respiratory failure and death), benefits (restoration of normal sinus rhythm) and alternatives of a direct current cardioversion were explained in detail to Mr. Shomaker and he agrees to proceed.     Will proceed with cardioversion  For questions or updates, please contact [facility] Please consult [email or web address] for contact info under Cardiology/STEMI.      Rolly Pancake, DO  11/18/2021, 12:31 PM

## 2021-11-18 NOTE — Plan of Care (Signed)
  Problem: Education: Goal: Knowledge of General Education information will improve Description: Including pain rating scale, medication(s)/side effects and non-pharmacologic comfort measures Outcome: Progressing   Problem: Clinical Measurements: Goal: Respiratory complications will improve Outcome: Progressing   Problem: Activity: Goal: Risk for activity intolerance will decrease Outcome: Progressing   Problem: Coping: Goal: Level of anxiety will decrease Outcome: Progressing   Problem: Elimination: Goal: Will not experience complications related to bowel motility Outcome: Progressing Goal: Will not experience complications related to urinary retention Outcome: Progressing   Problem: Skin Integrity: Goal: Risk for impaired skin integrity will decrease Outcome: Progressing   Problem: Fluid Volume: Goal: Ability to maintain a balanced intake and output will improve Outcome: Progressing

## 2021-11-18 NOTE — Progress Notes (Signed)
Transported to endo.by wheel chair awake and alert.

## 2021-11-18 NOTE — Progress Notes (Signed)
Progress Note  Patient Name: Stephen Roberts Date of Encounter: 11/18/2021  Primary Cardiologist: Werner Lean, MD   Subjective   No CP  Breathing is OK   Inpatient Medications    Scheduled Meds:  apixaban  5 mg Oral BID   ferrous sulfate  325 mg Oral BID   flecainide  100 mg Oral Q12H   insulin aspart  0-15 Units Subcutaneous TID WC   insulin aspart  0-5 Units Subcutaneous QHS   metFORMIN  500 mg Oral Q breakfast   metoprolol succinate  100 mg Oral Daily   multivitamin with minerals  1 tablet Oral Daily   pantoprazole  40 mg Oral Daily   pravastatin  20 mg Oral Once per day on Mon Wed Fri   sodium chloride flush  3 mL Intravenous Q12H   vitamin B-12  1,000 mcg Oral Daily   Continuous Infusions:  sodium chloride     sodium chloride 20 mL/hr at 11/18/21 0342   PRN Meds: sodium chloride, acetaminophen **OR** acetaminophen, ibuprofen, metoprolol tartrate, ondansetron **OR** ondansetron (ZOFRAN) IV, mouth rinse, oxyCODONE-acetaminophen, sodium chloride flush   Vital Signs    Vitals:   11/17/21 1647 11/17/21 1938 11/17/21 2342 11/18/21 0357  BP: 100/73 118/76 103/78 102/76  Pulse:   76 72  Resp: '17 18 19 15  '$ Temp: 97.8 F (36.6 C) 98.9 F (37.2 C) 98.9 F (37.2 C) 98.3 F (36.8 C)  TempSrc: Oral Oral Oral Oral  SpO2: 98% 98% 97% 98%  Weight:      Height:        Intake/Output Summary (Last 24 hours) at 11/18/2021 0746 Last data filed at 11/18/2021 0342 Gross per 24 hour  Intake 617.41 ml  Output --  Net 617.41 ml   Filed Weights   11/14/21 0933 11/14/21 1546 11/15/21 2239  Weight: 106.3 kg 105.5 kg 106.7 kg    Telemetry    AFib   Average rate 80s - Personally Reviewed  ECG    No new - Personally Reviewed  Physical Exam   Gen: no distress Neck: No JVD  Cardiac: Irreg irreg   NO S3     Respiratory: Clear to auscultation bilaterally, GI: Soft, nontender, non-distended  MS: No  edema;  moves all extremities Integument: Skin is  warm Neuro: alert and oriented to person/place/time/situation  Psych: Normal affect, patient feels frustrated   Labs    Chemistry Recent Labs  Lab 11/15/21 0523 11/16/21 0153 11/17/21 0420  NA 135 137 139  K 3.8 3.9 4.2  CL 105 106 101  CO2 '24 22 23  '$ GLUCOSE 154* 133* 188*  BUN '15 11 13  '$ CREATININE 0.58* 0.66 0.66  CALCIUM 8.6* 8.9 9.2  GFRNONAA >60 >60 >60  ANIONGAP '6 9 15     '$ Hematology Recent Labs  Lab 11/14/21 0935 11/15/21 0523 11/16/21 0153  WBC 9.2 5.5 6.1  RBC 4.43 4.28 4.36  HGB 13.7 13.1 13.7  HCT 41.1 40.4 40.3  MCV 92.8 94.4 92.4  MCH 30.9 30.6 31.4  MCHC 33.3 32.4 34.0  RDW 17.5* 17.4* 16.9*  PLT 106* 114* 118*    Cardiac EnzymesNo results for input(s): "TROPONINI" in the last 168 hours. No results for input(s): "TROPIPOC" in the last 168 hours.   BNPNo results for input(s): "BNP", "PROBNP" in the last 168 hours.   DDimer No results for input(s): "DDIMER" in the last 168 hours.   Radiology    No results found.   Patient Profile  56 y.o. male with a history of AF RVR s/p Cardioversion x 2  Presents with afbi     Assessment & Plan    1 Persistent Atrial Fibrillation Patient very symptomatic Started on flecanide a couple days ago    Still in afib today  Continue on flecanide and Eliquis      Note EP hs seen  Appt made for follow up in afib clinc   2  HFmrEF   May be rate related   Follow over time    Volume status is OK  3  PVCs   Follow on flecanide  4  OSA   Pt says he cannot tolerate CPAP  Plan to refer to ENT for Inspire   5  HL   On pravstatin   Normal coronary arteries      Continue   6  Hx urosepsis    Occurred after SGLT2i    Still with burning    Treated with Ceftriaxone Still with burning   This is his biggest complaint now     Will alert primary team     For questions or updates, please contact [facility] Please consult [email or web address] for contact info under Cardiology/STEMI.

## 2021-11-18 NOTE — Progress Notes (Signed)
Back from endoscopy awake and alert.

## 2021-11-19 ENCOUNTER — Ambulatory Visit: Payer: PPO | Admitting: Gastroenterology

## 2021-11-19 ENCOUNTER — Telehealth: Payer: Self-pay

## 2021-11-19 ENCOUNTER — Encounter (HOSPITAL_COMMUNITY): Payer: Self-pay | Admitting: Cardiology

## 2021-11-19 LAB — CULTURE, BLOOD (ROUTINE X 2)
Culture: NO GROWTH
Culture: NO GROWTH
Special Requests: ADEQUATE
Special Requests: ADEQUATE

## 2021-11-19 NOTE — Telephone Encounter (Signed)
Transition Care Management Follow-up Telephone Call Date of discharge and from where: 11/18/21 [facility] How have you been since you were released from the hospital? Feeling better. Any questions or concerns? No   Items Reviewed: Did the pt receive and understand the discharge instructions provided? Yes  Medications obtained and verified? Yes  Other? No  Any new allergies since your discharge? Yes  Dietary orders reviewed? Yes Do you have support at home? Yes   Home Care and Equipment/Supplies: Were home health services ordered? no If so, what is the name of the agency? N/a  Has the agency set up a time to come to the patient's home? no Were any new equipment or medical supplies ordered?  No What is the name of the medical supply agency? N/a Were you able to get the supplies/equipment? no Do you have any questions related to the use of the equipment or supplies? No  Functional Questionnaire: (I = Independent and D = Dependent) ADLs: i  Bathing/Dressing- i  Meal Prep- i  Eating- i  Maintaining continence- i  Transferring/Ambulation- i  Managing Meds- i  Follow up appointments reviewed:  PCP Hospital f/u appt confirmed? Yes  Scheduled to see Posey Pronto  on 12/02/21. [facility] f/u appt confirmed? No   Are transportation arrangements needed? No  If their condition worsens, is the pt aware to call PCP or go to the Emergency Dept.? Yes Was the patient provided with contact information for the PCP's office or ED? Yes Was to pt encouraged to call back with questions or concerns? Yes

## 2021-11-20 ENCOUNTER — Telehealth: Payer: Self-pay

## 2021-11-20 NOTE — Telephone Encounter (Signed)
Call to pt reference PREP referral and next class starting in late Aug at [facility].  Would like to participate. Prefers AM classes if possible.  Advised will call back with dates and times once available and will set up intake appt prior to start of class

## 2021-11-20 NOTE — Anesthesia Postprocedure Evaluation (Signed)
Anesthesia Post Note  Patient: Stephen Roberts  Procedure(s) Performed: CARDIOVERSION     Patient location during evaluation: Endoscopy Anesthesia Type: General Level of consciousness: awake and alert Pain management: pain level controlled Vital Signs Assessment: post-procedure vital signs reviewed and stable Respiratory status: spontaneous breathing, nonlabored ventilation and respiratory function stable Cardiovascular status: blood pressure returned to baseline and stable Postop Assessment: no apparent nausea or vomiting Anesthetic complications: no   No notable events documented.  Last Vitals:  Vitals:   11/18/21 1608 11/18/21 1615  BP:  97/64  Pulse:    Resp:  16  Temp:  36.6 C  SpO2: 92% 96%    Last Pain:  Vitals:   11/18/21 1615  TempSrc: Oral  PainSc:                  Maura Braaten

## 2021-11-22 ENCOUNTER — Inpatient Hospital Stay (HOSPITAL_COMMUNITY): Payer: PPO | Attending: Hematology

## 2021-11-22 VITALS — BP 111/78 | HR 64 | Temp 97.4°F | Resp 18

## 2021-11-22 DIAGNOSIS — K909 Intestinal malabsorption, unspecified: Secondary | ICD-10-CM | POA: Insufficient documentation

## 2021-11-22 DIAGNOSIS — K922 Gastrointestinal hemorrhage, unspecified: Secondary | ICD-10-CM | POA: Insufficient documentation

## 2021-11-22 DIAGNOSIS — Z79899 Other long term (current) drug therapy: Secondary | ICD-10-CM | POA: Insufficient documentation

## 2021-11-22 DIAGNOSIS — R591 Generalized enlarged lymph nodes: Secondary | ICD-10-CM | POA: Diagnosis not present

## 2021-11-22 DIAGNOSIS — D110 Benign neoplasm of parotid gland: Secondary | ICD-10-CM | POA: Insufficient documentation

## 2021-11-22 DIAGNOSIS — D500 Iron deficiency anemia secondary to blood loss (chronic): Secondary | ICD-10-CM | POA: Diagnosis not present

## 2021-11-22 MED ORDER — SODIUM CHLORIDE 0.9 % IV SOLN: Freq: Once | INTRAVENOUS | Status: AC

## 2021-11-22 MED ORDER — SODIUM CHLORIDE 0.9 % IV SOLN
510.0000 mg | Freq: Once | INTRAVENOUS | Status: AC
  Administered 2021-11-22: 510 mg via INTRAVENOUS
  Filled 2021-11-22: qty 17

## 2021-11-22 NOTE — Patient Instructions (Signed)
[facility]  Discharge Instructions: Thank you for choosing [facility] to provide your oncology and hematology care.  If you have a lab appointment with the [facility], please come in thru the Main Entrance and check in at the main information desk.  Wear comfortable clothing and clothing appropriate for easy access to any Portacath or PICC line.   We strive to give you quality time with your provider. You may need to reschedule your appointment if you arrive late (15 or more minutes).  Arriving late affects you and other patients whose appointments are after yours.  Also, if you miss three or more appointments without notifying the office, you may be dismissed from the clinic at the provider's discretion.      For prescription refill requests, have your pharmacy contact our office and allow 72 hours for refills to be completed.    Today you received the following chemotherapy and/or immunotherapy agents Feraheme IV iron.  BELOW ARE SYMPTOMS THAT SHOULD BE REPORTED IMMEDIATELY: *FEVER GREATER THAN 100.4 F (38 C) OR HIGHER *CHILLS OR SWEATING *NAUSEA AND VOMITING THAT IS NOT CONTROLLED WITH YOUR NAUSEA MEDICATION *UNUSUAL SHORTNESS OF BREATH *UNUSUAL BRUISING OR BLEEDING *URINARY PROBLEMS (pain or burning when urinating, or frequent urination) *BOWEL PROBLEMS (unusual diarrhea, constipation, pain near the anus) TENDERNESS IN MOUTH AND THROAT WITH OR WITHOUT PRESENCE OF ULCERS (sore throat, sores in mouth, or a toothache) UNUSUAL RASH, SWELLING OR PAIN  UNUSUAL VAGINAL DISCHARGE OR ITCHING   Items with * indicate a potential emergency and should be followed up as soon as possible or go to the Emergency Department if any problems should occur.  Please show the CHEMOTHERAPY ALERT CARD or IMMUNOTHERAPY ALERT CARD at check-in to the Emergency Department and triage nurse.  Should you have questions after your visit or need to cancel or reschedule your appointment,  please contact [facility] [phone]  and follow the prompts.  Office hours are 8:00 a.m. to 4:30 p.m. Monday - Friday. Please note that voicemails left after 4:00 p.m. may not be returned until the following business day.  We are closed weekends and major holidays. You have access to a nurse at all times for urgent questions. Please call the main number to the clinic [phone] and follow the prompts.  For any non-urgent questions, you may also contact your provider using MyChart. We now offer e-Visits for anyone 29 and older to request care online for non-urgent symptoms. For details visit [email or web address].   Also download the MyChart app! Go to the app store, search "MyChart", open the app, select [facility], and log in with your MyChart username and password.  Masks are optional in the cancer centers. If you would like for your care team to wear a mask while they are taking care of you, please let them know. For doctor visits, patients may have with them one support person who is at least 58 years old. At this time, visitors are not allowed in the infusion area.

## 2021-11-22 NOTE — Progress Notes (Signed)
Pt presents today for Feraheme IV iron per provider's order. Vital signs stable and pt voiced no new complaints at this time. Pt took pre-meds Tylenol and Claritin at home prior to arrival.  Peripheral IV started with good blood return pre and post infusion.  Feraheme given today per MD orders. Tolerated infusion without adverse affects. Vital signs stable. No complaints at this time. Discharged from clinic ambulatory in stable condition. Alert and oriented x 3. F/U with [facility] as scheduled.

## 2021-11-24 ENCOUNTER — Other Ambulatory Visit: Payer: Self-pay

## 2021-11-24 ENCOUNTER — Ambulatory Visit (HOSPITAL_COMMUNITY)
Admit: 2021-11-24 | Discharge: 2021-11-24 | Disposition: A | Discharge status: Home / Self Care | Payer: PPO | Source: Ambulatory Visit | Attending: Nurse Practitioner | Admitting: Nurse Practitioner

## 2021-11-24 VITALS — BP 104/72 | HR 62 | Ht 69.0 in | Wt 232.2 lb

## 2021-11-24 DIAGNOSIS — Z7984 Long term (current) use of oral hypoglycemic drugs: Secondary | ICD-10-CM | POA: Insufficient documentation

## 2021-11-24 DIAGNOSIS — I10 Essential (primary) hypertension: Secondary | ICD-10-CM | POA: Insufficient documentation

## 2021-11-24 DIAGNOSIS — Z7901 Long term (current) use of anticoagulants: Secondary | ICD-10-CM | POA: Insufficient documentation

## 2021-11-24 DIAGNOSIS — N390 Urinary tract infection, site not specified: Secondary | ICD-10-CM | POA: Insufficient documentation

## 2021-11-24 DIAGNOSIS — F17210 Nicotine dependence, cigarettes, uncomplicated: Secondary | ICD-10-CM | POA: Insufficient documentation

## 2021-11-24 DIAGNOSIS — K219 Gastro-esophageal reflux disease without esophagitis: Secondary | ICD-10-CM | POA: Diagnosis not present

## 2021-11-24 DIAGNOSIS — E785 Hyperlipidemia, unspecified: Secondary | ICD-10-CM | POA: Diagnosis not present

## 2021-11-24 DIAGNOSIS — D509 Iron deficiency anemia, unspecified: Secondary | ICD-10-CM | POA: Diagnosis not present

## 2021-11-24 DIAGNOSIS — E669 Obesity, unspecified: Secondary | ICD-10-CM | POA: Diagnosis not present

## 2021-11-24 DIAGNOSIS — D6869 Other thrombophilia: Secondary | ICD-10-CM | POA: Diagnosis not present

## 2021-11-24 DIAGNOSIS — E118 Type 2 diabetes mellitus with unspecified complications: Secondary | ICD-10-CM | POA: Diagnosis not present

## 2021-11-24 DIAGNOSIS — Z79899 Other long term (current) drug therapy: Secondary | ICD-10-CM | POA: Diagnosis not present

## 2021-11-24 DIAGNOSIS — I4819 Other persistent atrial fibrillation: Secondary | ICD-10-CM | POA: Insufficient documentation

## 2021-11-24 NOTE — Progress Notes (Signed)
Primary Care Physician: Lindell Spar, MD Referring Physician:Dr. Jun Osment is a 56 y.o. male with a h/o KUMAR FALWELL is a 56 y.o. male with medical history significant for atrial fibrillation on Eliquis, type 2 diabetes, iron deficiency anemia, hypertension, OSA, tobacco abuse, GERD, dyslipidemia, and obesity.    He went to urgent Care in [place] on 7/01 with urinary frequency and hesitancy as well as pain.  He was diagnosed with UTI, given Rocephin IM x1 and prescribed a 10-day course of Bactrim p.o. However, he never got the prescription filled.   On 07/02, he called the on-call PA/NP reporting a heart rate of 170.  He was severely symptomatic with this.  He was instructed to come to the ER.   Urine culture performed which showed greater than 100,000 colonies of E. coli, sensitive to all tested medications.  He got IV Rocephin 1 g on 07/02-07/04, a total of 4 doses.  He will be on Bactrim for 7 days at discharge.    Urine culture performed which showed greater than 100,000 colonies of E. coli, sensitive to all tested medications.  He got IV Rocephin 1 g on 07/02-07/04, a total of 4 doses.  He will be on Bactrim for 7 days at discharge.  On 11/18/2021, he had cardioversion with 200 J of biphasic synchronized energy.  He converted to normal sinus rhythm.  There were no complications.   Post cardioversion, he is doing well.  He is to continue the flecainide at 100 mg twice daily and Toprol-XL at 100 mg daily.  He is to continue the Eliquis 5 mg twice daily and it was emphasized that he should not miss doses.   He is doing well post cardioversion, and no further inpatient work-up is indicated.  He is considered stable for discharge, to follow-up as an outpatient.  He is now in the afib clinic for f/u. EKG shows SR with first degree AVB and PVC's. He had PVC's in the hospital. Dr. Curt Bears saw on consult while in the hospital and recommended  the flecainide.he had a  successful cardioversion in the hospital.  He feels improved but still tired. He is suppose to see Dr. Radford Pax later in the summer to discuss other means to treat sleep apnea. He has a Investment banker, operational at home to track rhythm. He is compliant with eliquis 5 mg bid. He is still on antibiotics for UTI, has f/u with urology later this week.  Today, he denies symptoms of palpitations, chest pain, shortness of breath, orthopnea, PND, lower extremity edema, dizziness, presyncope, syncope, or neurologic sequela. The patient is tolerating medications without difficulties and is otherwise without complaint today.   Past Medical History:  Diagnosis Date   A-fib ([place])    Arthritis    Diabetes ([facility])    GERD (gastroesophageal reflux disease)    Heart murmur    Pneumonia 06/27/2006   Past Surgical History:  Procedure Laterality Date   ANTERIOR FUSION CERVICAL SPINE     CARDIOVERSION N/A 10/05/2021   Procedure: CARDIOVERSION;  Surgeon: Sanda Klein, MD;  Location: [facility];  Service: Cardiovascular;  Laterality: N/A;   CARDIOVERSION N/A 11/18/2021   Procedure: CARDIOVERSION;  Surgeon: Berniece Salines, DO;  Location: [facility];  Service: Cardiovascular;  Laterality: N/A;   COLONOSCOPY WITH PROPOFOL N/A 03/26/2020   Procedure: COLONOSCOPY WITH PROPOFOL;  Surgeon: Daneil Dolin, MD;  [facility];  Service: Endoscopy;  Laterality: N/A;   disc in neck Left 05/2017  ESOPHAGEAL DILATION N/A 03/25/2020   Procedure: ESOPHAGEAL DILATION;  Surgeon: Rogene Houston, MD;  [facility];  Service: Endoscopy;  Laterality: N/A;   ESOPHAGOGASTRODUODENOSCOPY (EGD) WITH PROPOFOL N/A 03/25/2020   Procedure: ESOPHAGOGASTRODUODENOSCOPY (EGD) WITH PROPOFOL;  Surgeon: Rogene Houston, MD;  [facility];  Service: Endoscopy;  Laterality: N/A;   GIVENS CAPSULE STUDY N/A 04/13/2020   Procedure: GIVENS CAPSULE STUDY;  Surgeon: Daneil Dolin, MD;  [facility];  Service: Endoscopy;   Laterality: N/A;  7:30am   INSERTION OF MESH N/A 01/23/2013   Procedure: INSERTION OF MESH;  Surgeon: Adin Hector, MD;  Location: [facility];  Service: General;  Laterality: N/A;   LEFT HEART CATH AND CORONARY ANGIOGRAPHY N/A 08/27/2021   Procedure: LEFT HEART CATH AND CORONARY ANGIOGRAPHY;  Surgeon: Martinique, Peter M, MD;  Location: [facility];  Service: Cardiovascular;  Laterality: N/A;   POLYPECTOMY  03/26/2020   Procedure: POLYPECTOMY;  Surgeon: Daneil Dolin, MD;  [facility];  Service: Endoscopy;;   TONSILLECTOMY     as child   VENTRAL HERNIA REPAIR N/A 01/23/2013   Procedure: LAPAROSCOPIC MULTIPLE VENTRAL HERNIAS;  Surgeon: Adin Hector, MD;  Location: [facility];  Service: General;  Laterality: N/A;    Current Outpatient Medications  Medication Sig Dispense Refill   acetaminophen (TYLENOL) 650 MG CR tablet Take 1,300 mg by mouth every 8 (eight) hours as needed for pain.     albuterol (VENTOLIN HFA) 108 (90 Base) MCG/ACT inhaler INHALE 2 PUFFS INTO THE LUNGS EVERY 6 HOURS AS NEEDED FOR WHEEZING OR SHORTNESS OF BREATH 18 g 0   apixaban (ELIQUIS) 5 MG TABS tablet Take 1 tablet (5 mg total) by mouth 2 (two) times daily. 60 tablet 5   Ascorbic Acid (VITAMIN C PO) Take 500 mg by mouth daily.     esomeprazole (NEXIUM) 40 MG capsule Take 1 capsule (40 mg total) by mouth daily. 90 capsule 3   ferrous sulfate 325 (65 FE) MG EC tablet Take 1 tablet (325 mg total) by mouth 2 (two) times daily. 60 tablet 3   flecainide (TAMBOCOR) 100 MG tablet Take 1 tablet (100 mg total) by mouth every 12 (twelve) hours. 60 tablet 3   furosemide (LASIX) 20 MG tablet Take 1 tablet (20 mg total) by mouth daily. 90 tablet 3   Lactobacillus Rhamnosus, GG, (PROBIOTIC COLIC) LIQD Take 1 tablet by mouth 4 (four) times a week.     metFORMIN (GLUCOPHAGE) 500 MG tablet Take 1 tablet (500 mg total) by mouth 2 (two) times daily with a meal. (Patient taking differently: Take 500 mg by mouth 2 (two) times  daily.) 60 tablet 5   metoprolol succinate (TOPROL-XL) 100 MG 24 hr tablet Take 1 tablet (100 mg total) by mouth daily. 30 tablet 6   Multiple Vitamin (MULTIVITAMIN) tablet Take 1 tablet by mouth daily.     oxyCODONE-acetaminophen (PERCOCET/ROXICET) 5-325 MG tablet Take 1 tablet by mouth every 8 (eight) hours as needed for severe pain.     potassium chloride SA (KLOR-CON M) 10 MEQ tablet Take 1 tablet (10 mEq total) by mouth daily. 90 tablet 3   pravastatin (PRAVACHOL) 20 MG tablet Take one tablet three times per week. 30 tablet 1   sulfamethoxazole-trimethoprim (BACTRIM DS) 800-160 MG tablet Take 1 tablet by mouth 2 (two) times daily. 14 tablet 0   Turmeric 500 MG CAPS Take 500 mg by mouth daily.     vitamin B-12 (  CYANOCOBALAMIN) 1000 MCG tablet Take 1,000 mcg by mouth daily.     No current facility-administered medications for this encounter.   Facility-Administered Medications Ordered in Other Encounters  Medication Dose Route Frequency Provider Last Rate Last Admin   iron sucrose (VENOFER) 200 mg in sodium chloride 0.9 % 100 mL IVPB  200 mg Intravenous Once Pennington, Rebekah M, PA-C        Allergies  Allergen Reactions   Gabapentin Shortness Of Breath   Lyrica [Pregabalin] Shortness Of Breath   Crestor [Rosuvastatin] Other (See Comments)    myalgia   Farxiga [Dapagliflozin]     Developed UTI    Social History   Socioeconomic History   Marital status: Married    Spouse name: Not on file   Number of children: Not on file   Years of education: Not on file   Highest education level: Not on file  Occupational History   Not on file  Tobacco Use   Smoking status: Every Day    Packs/day: 0.50    Years: 42.00    Total pack years: 21.00    Types: Cigarettes    Start date: 57   Smokeless tobacco: Never  Vaping Use   Vaping Use: Never used  Substance and Sexual Activity   Alcohol use: Yes    Comment: moderately   Drug use: Not Currently    Types: Cocaine    Comment:  states he has tried it all. Last used cocaine a few months ago.    Sexual activity: Yes  Other Topics Concern   Not on file  Social History Narrative   Not on file   Social Determinants of Health   Financial Resource Strain: Low Risk  (04/20/2020)   Overall Financial Resource Strain (CARDIA)    Difficulty of Paying Living Expenses: Not hard at all  Food Insecurity: No Food Insecurity (04/20/2020)   Hunger Vital Sign    Worried About Running Out of Food in the Last Year: Never true    Ran Out of Food in the Last Year: Never true  Transportation Needs: No Transportation Needs (04/20/2020)   PRAPARE - Hydrologist (Medical): No    Lack of Transportation (Non-Medical): No  Physical Activity: Inactive (04/20/2020)   Exercise Vital Sign    Days of Exercise per Week: 0 days    Minutes of Exercise per Session: 0 min  Stress: Stress Concern Present (04/20/2020)   [place]    Feeling of Stress : Very much  Social Connections: Moderately Isolated (04/20/2020)   Social Connection and Isolation Panel [NHANES]    Frequency of Communication with Friends and Family: More than three times a week    Frequency of Social Gatherings with Friends and Family: More than three times a week    Attends Religious Services: Never    Marine scientist or Organizations: No    Attends Archivist Meetings: Never    Marital Status: Married  Human resources officer Violence: Not At Risk (04/20/2020)   Humiliation, Afraid, Rape, and Kick questionnaire    Fear of Current or Ex-Partner: No    Emotionally Abused: No    Physically Abused: No    Sexually Abused: No    Family History  Problem Relation Age of Onset   Depression Mother    Hearing loss Mother    Hyperlipidemia Mother    Hypertension Mother    Stroke Mother  Early death Father    Diabetes Brother    Pancreatic cancer Paternal Uncle    Colon  cancer Neg Hx     ROS- All systems are reviewed and negative except as per the HPI above  Physical Exam: Vitals:   11/24/21 1421  Height: '5\' 9"'$  (1.753 m)   Wt Readings from Last 3 Encounters:  11/15/21 106.7 kg  11/09/21 106.3 kg  11/09/21 106.3 kg    Labs: Lab Results  Component Value Date   NA 139 11/17/2021   K 4.2 11/17/2021   CL 101 11/17/2021   CO2 23 11/17/2021   GLUCOSE 188 (H) 11/17/2021   BUN 13 11/17/2021   CREATININE 0.66 11/17/2021   CALCIUM 9.2 11/17/2021   PHOS 4.1 03/24/2020   MG 1.8 11/16/2021   Lab Results  Component Value Date   INR 1.1 11/17/2021   Lab Results  Component Value Date   CHOL 159 02/13/2019   HDL 38 (L) 02/13/2019   LDLCALC 97 02/13/2019   TRIG 140 02/13/2019     GEN- The patient is well appearing, alert and oriented x 3 today.   Head- normocephalic, atraumatic Eyes-  Sclera clear, conjunctiva pink Ears- hearing intact Oropharynx- clear Neck- supple, no JVP Lymph- no cervical lymphadenopathy Lungs- Clear to ausculation bilaterally, normal work of breathing Heart- Regular rate and rhythm, no murmurs, rubs or gallops, PMI not laterally displaced GI- soft, NT, ND, + BS Extremities- no clubbing, cyanosis, or edema MS- no significant deformity or atrophy Skin- no rash or lesion Psych- euthymic mood, full affect Neuro- strength and sensation are intact  EKG-Vent. rate 67 BPM PR interval 224 ms QRS duration 114 ms QT/QTcB 438/462 ms P-R-T axes 65 -41 32 Sinus rhythm with 1st degree A-V block with occasional Premature ventricular complexes Left axis deviation Abnormal ECG When compared with ECG of 15-Nov-2021 09:35, PREVIOUS ECG IS PRESENT since last tracing no significant change Confirmed by Larae Grooms [id number]) on 11/20/2021 10:57:37 AM   Echo- 1. Left ventricular ejection fraction, by estimation, is 45 to 50%. The  left ventricle has mildly decreased function. The left ventricle  demonstrates global  hypokinesis. There is mild left ventricular  hypertrophy. Left ventricular diastolic parameters  are indeterminate.   2. Right ventricular systolic function is normal. The right ventricular  size is normal. There is normal pulmonary artery systolic pressure.   3. Left atrial size was severely dilated.   4. Right atrial size was moderately dilated.   5. The mitral valve is normal in structure. Trivial mitral valve  regurgitation. No evidence of mitral stenosis.   6. The aortic valve is tricuspid. Aortic valve regurgitation is not  visualized. Aortic valve sclerosis is present, with no evidence of aortic  valve stenosis.   7. The inferior vena cava is dilated in size with >50% respiratory  variability, suggesting right atrial pressure of 8 mmHg.   Comparison(s): LVEF is slight worse, LA size has increased.   FINDINGS   Left Ventricle: Left ventricular ejection fraction, by estimation, is 45  to 50%. The left ventricle has mildly decreased function. The left  ventricle demonstrates global hypokinesis. The left ventricular internal  cavity size was normal in size. There is   mild left ventricular hypertrophy. Left ventricular diastolic function  could not be evaluated due to atrial fibrillation. Left ventricular  diastolic parameters are indeterminate   Assessment and Plan:  1. Afib General eduction re afib  Triggers discussed  Now on flecainide with successful cardioversion and remains  in SR with stable intervals  Continue flecainide  100 mg bid Continue metoprolol succinate 100 mg daily   2. CHA2DS2VASc  score of 3 Continue eliquis 5 mg bid Would advise to stop tumeric as it can increase risk of bleeding   3.HTN Stable   4. UTI On antibiotic's Has f/u with urology this week   5. Untreated sleep apnea Has appointment with 8/23 with Dr. Radford Pax    F/u with Tommye Standard, PA-C 8/7,Dr. Curt Bears 8/16, and Dr. Gasper Sells 8/15  Butch Penny C. Valyn Latchford, [facility] [address] [place], [place] [address] [phone]

## 2021-11-26 IMAGING — CT CT CHEST W/ CM
2 of 3 series · 15 of 36 positions shown, 18 images · IV contrast (Omnipaque or Isovue)
Comparison: Today's neck CT, dictated separately. Chest radiograph
03/23/2020. remote chest CT of 07/03/2010 also reviewed.

CLINICAL DATA: Weight loss.  Lymphadenopathy.  Left neck swelling.

EXAM:
CT CHEST WITH CONTRAST
TECHNIQUE: Multidetector CT imaging of the chest was performed during
intravenous contrast administration.
CONTRAST:  100mL OMNIPAQUE IOHEXOL 300 MG/ML  SOLN

[Series 2: routine chest with · axial · 0.96mm/px · z∈[+962,+1222]mm · 12 of 154 slices shown, 15 images]
[im 12/154  mediastinal]
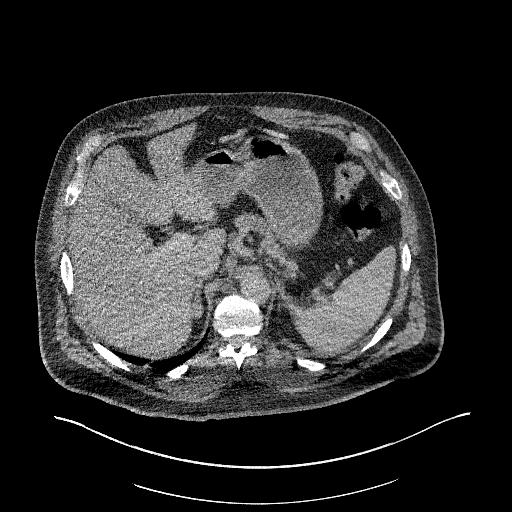
[im 12/154  lung]
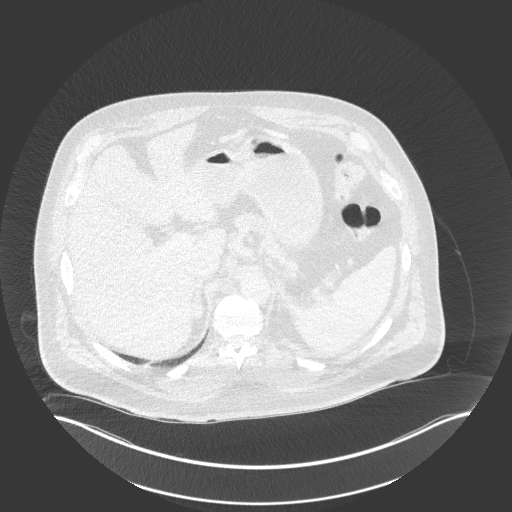
[im 23/154  lung]
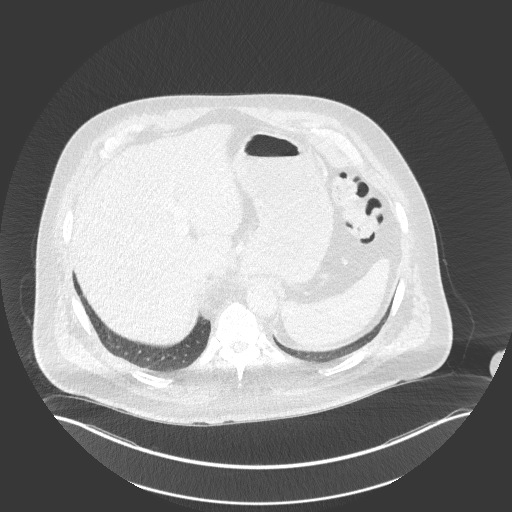
[im 35/154  lung]
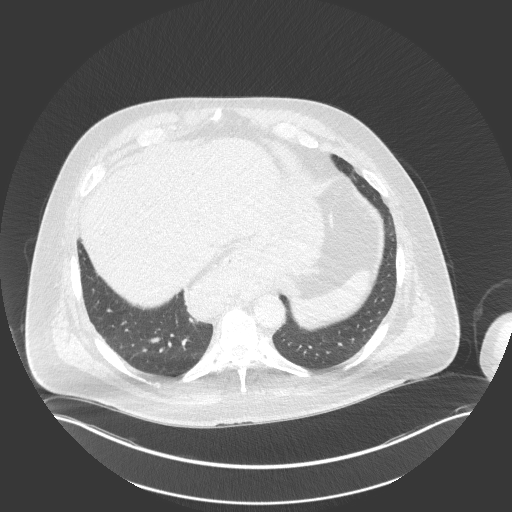
[im 46/154  lung]
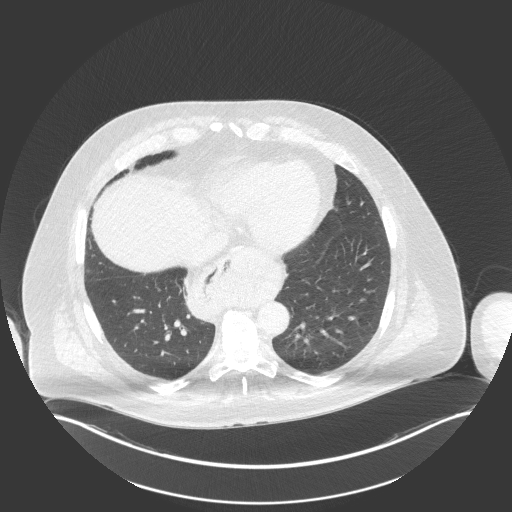
[im 57/154  mediastinal]
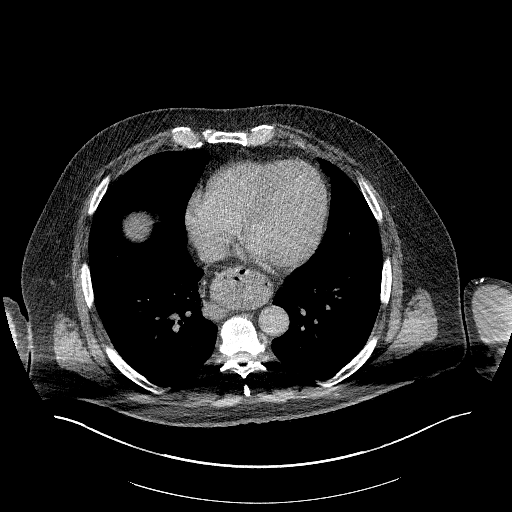
[im 57/154  lung]
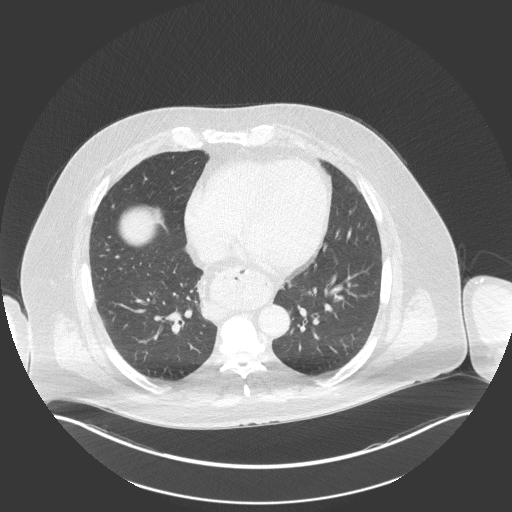
[im 69/154  lung]
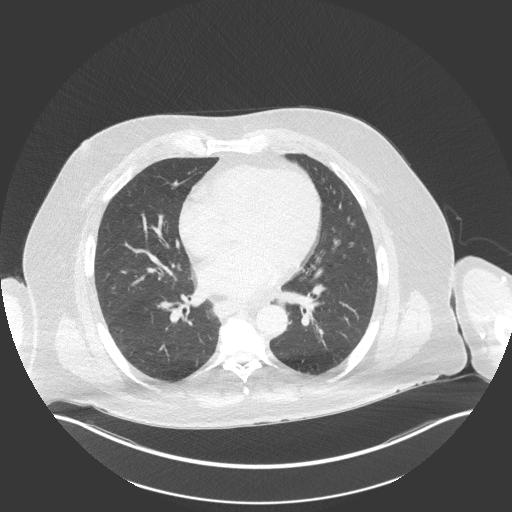
[im 86/154  lung]
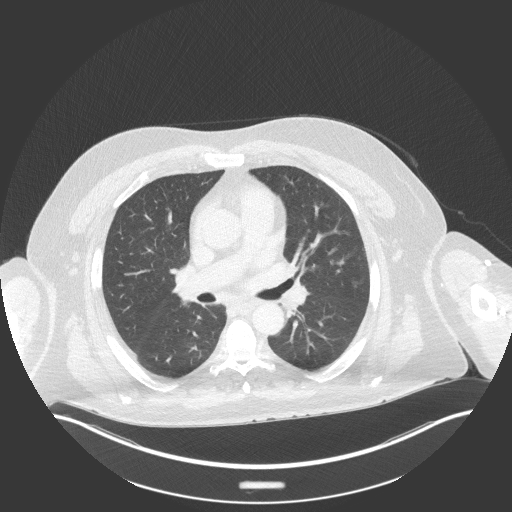
[im 97/154  lung]
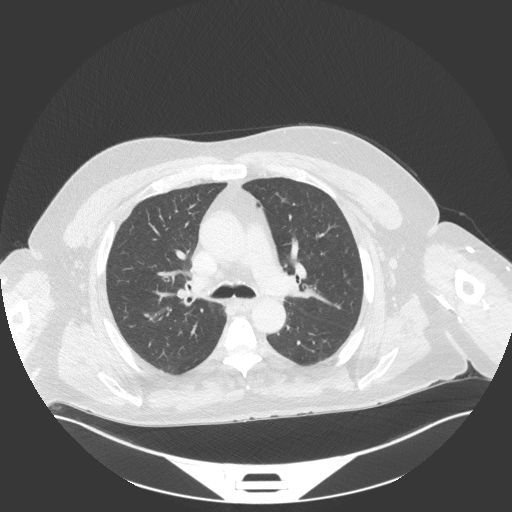
[im 108/154  mediastinal]
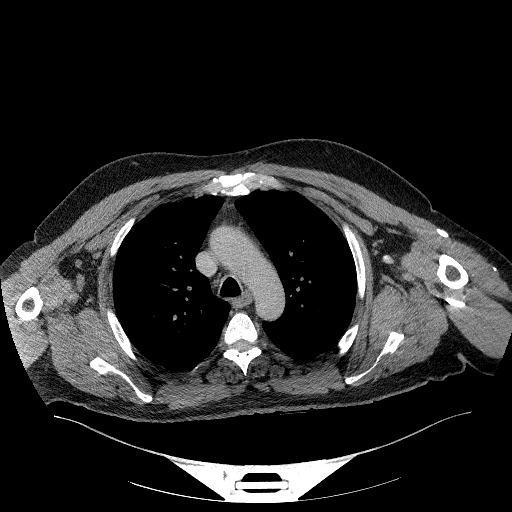
[im 108/154  lung]
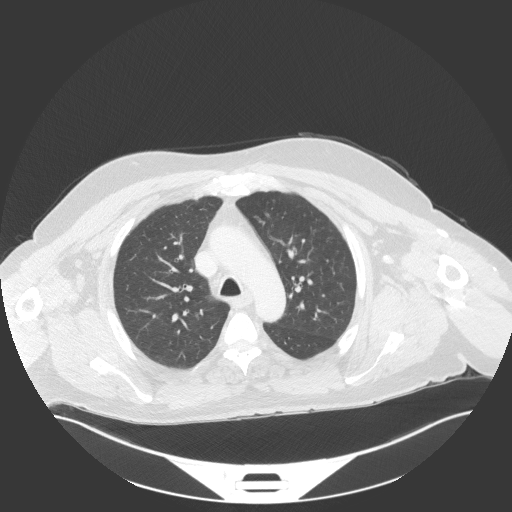
[im 120/154  lung]
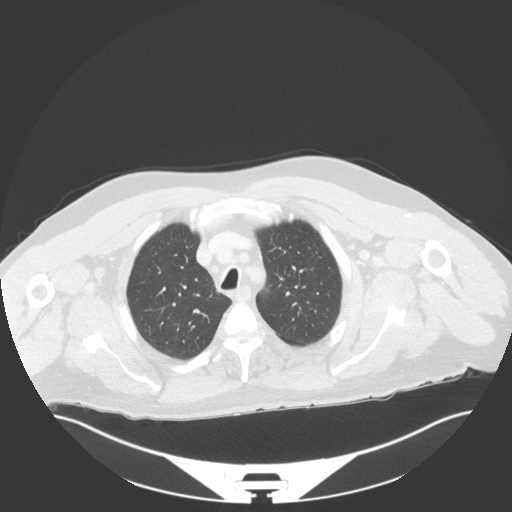
[im 131/154  lung]
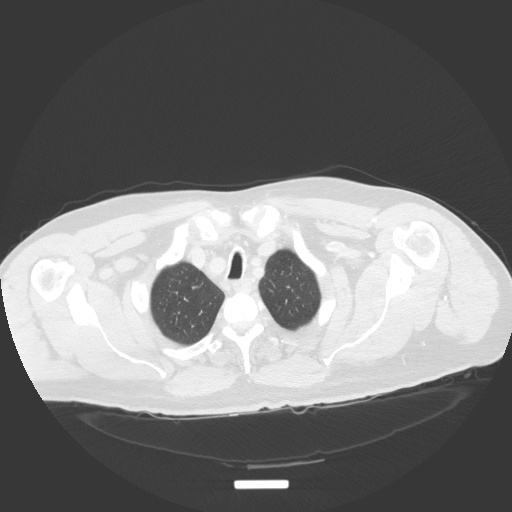
[im 142/154  lung]
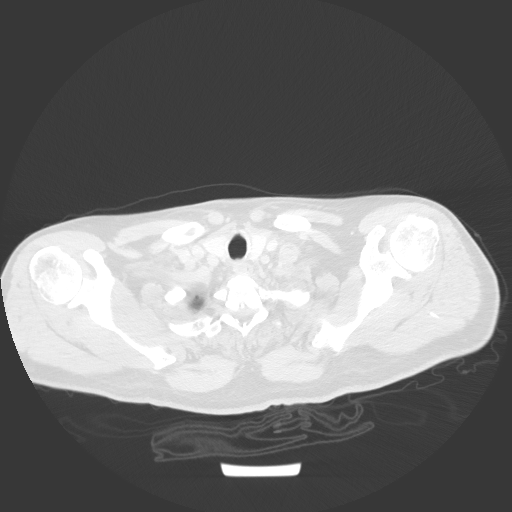

[Series 5: coronal · coronal · 0.63mm/px · 3 of 174 slices shown]
[im 35/174  lung]
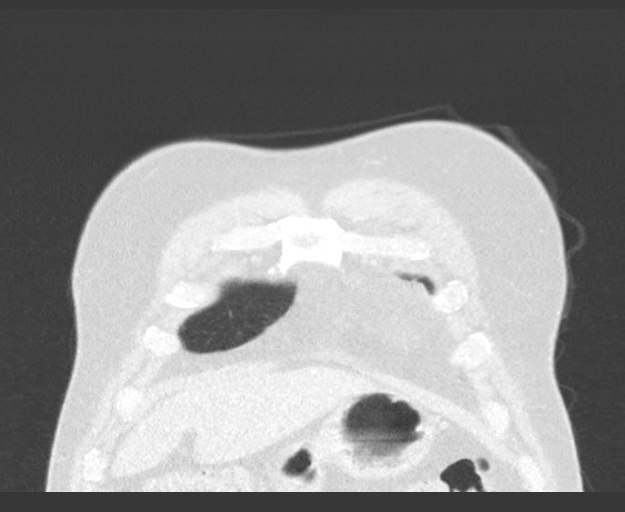
[im 70/174  lung]
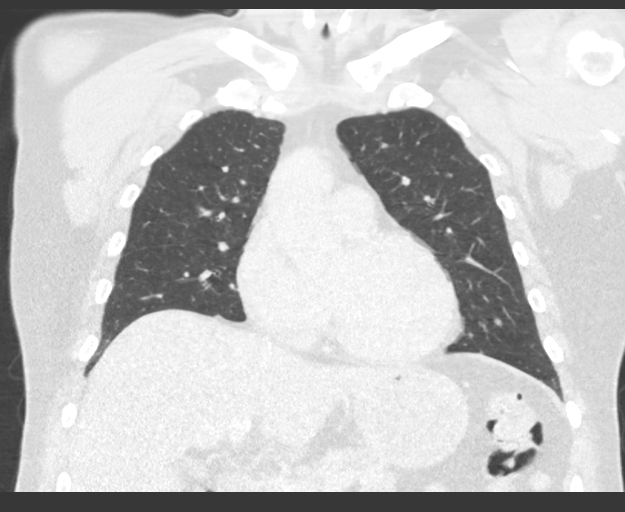
[im 104/174  lung]
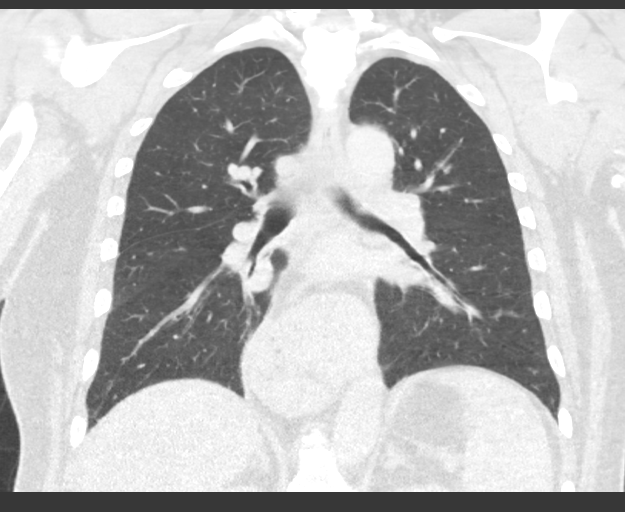

[15 of 36 positions shown; findings below may reference images not displayed]

FINDINGS: Cardiovascular: Aortic atherosclerosis. Borderline cardiomegaly,
without pericardial effusion. Three-vessel coronary artery
calcification.

Mediastinum/Nodes: Small bilateral axillary nodes are similar,
likely reactive. No mediastinal or hilar adenopathy. Moderate hiatal
hernia.

Lungs/Pleura: No pleural fluid.  Mild centrilobular emphysema.

Mild right base scarring.

Upper Abdomen: Normal imaged portions of the liver, spleen,
pancreas, left adrenal gland, gallbladder, kidneys. Minimal right
adrenal nodularity is unchanged.

Musculoskeletal: Cervical spine fixation.
IMPRESSION: 1. No evidence of thoracic adenopathy or primary malignancy within
the chest.
2. Aortic atherosclerosis (OH1I2-PLI.I), coronary artery
atherosclerosis and emphysema ([id number].K).
3. Moderate hiatal hernia.

## 2021-11-29 ENCOUNTER — Encounter: Payer: PPO | Admitting: Internal Medicine

## 2021-12-02 ENCOUNTER — Ambulatory Visit: Payer: PPO | Admitting: Cardiology

## 2021-12-02 ENCOUNTER — Ambulatory Visit (INDEPENDENT_AMBULATORY_CARE_PROVIDER_SITE_OTHER): Payer: PPO | Admitting: Internal Medicine

## 2021-12-02 ENCOUNTER — Encounter: Payer: Self-pay | Admitting: Internal Medicine

## 2021-12-02 VITALS — BP 137/70 | Ht 69.0 in | Wt 233.8 lb

## 2021-12-02 DIAGNOSIS — I4891 Unspecified atrial fibrillation: Secondary | ICD-10-CM | POA: Diagnosis not present

## 2021-12-02 DIAGNOSIS — Z09 Encounter for follow-up examination after completed treatment for conditions other than malignant neoplasm: Secondary | ICD-10-CM | POA: Diagnosis not present

## 2021-12-02 DIAGNOSIS — E1169 Type 2 diabetes mellitus with other specified complication: Secondary | ICD-10-CM | POA: Diagnosis not present

## 2021-12-02 DIAGNOSIS — N3000 Acute cystitis without hematuria: Secondary | ICD-10-CM

## 2021-12-02 DIAGNOSIS — J449 Chronic obstructive pulmonary disease, unspecified: Secondary | ICD-10-CM | POA: Diagnosis not present

## 2021-12-02 MED ORDER — METFORMIN HCL 1000 MG PO TABS: 1000.0000 mg | ORAL_TABLET | Freq: Two times a day (BID) | ORAL | 5 refills | Status: DC

## 2021-12-02 NOTE — Progress Notes (Signed)
Established Patient Office Visit  Subjective:  Patient ID: Stephen Roberts, male    DOB: 1965-11-06  Age: 56 y.o. MRN: [id number]  CC:  Chief Complaint  Patient presents with   Transitions Of Care    HPI Stephen Roberts is a 56 y.o. male with past medical history of atrial fibrillation,left parotid mass, IDA, type II DM, HLD, chronic neck pain and tobacco abuse who presents for f/u after recent hospitalization for atrial fibrillation with RVR.  He was admitted for A-fib with RVR, and his dose of Toprol-XL was increased to 100 mg daily.  He was also started on flecainide, but did not improve his A-fib. On 11/18/2021, he had cardioversion with 200 J of biphasic synchronized energy.  He converted to normal sinus rhythm.  There were no complications. He is doing well post cardioversion.  Denies any chest pain, dyspnea or palpitations.  He was discharged on 07/06. he currently takes Toprol XL 100 mg daily, flecainide 100 mg daily and Eliquis 5 mg twice daily.  Denies any signs of active bleeding currently.   Past Medical History:  Diagnosis Date   A-fib ([place])    Arthritis    Diabetes (HCC)    GERD (gastroesophageal reflux disease)    Heart murmur    Pneumonia 06/27/2006    Past Surgical History:  Procedure Laterality Date   ANTERIOR FUSION CERVICAL SPINE     CARDIOVERSION N/A 10/05/2021   Procedure: CARDIOVERSION;  Surgeon: Sanda Klein, MD;  Location: [facility];  Service: Cardiovascular;  Laterality: N/A;   CARDIOVERSION N/A 11/18/2021   Procedure: CARDIOVERSION;  Surgeon: Berniece Salines, DO;  Location: [place];  Service: Cardiovascular;  Laterality: N/A;   COLONOSCOPY WITH PROPOFOL N/A 03/26/2020   Procedure: COLONOSCOPY WITH PROPOFOL;  Surgeon: Daneil Dolin, MD;  [facility];  Service: Endoscopy;  Laterality: N/A;   disc in neck Left 05/2017   ESOPHAGEAL DILATION N/A 03/25/2020   Procedure: ESOPHAGEAL DILATION;  Surgeon: Rogene Houston, MD;  [facility];  Service: Endoscopy;  Laterality: N/A;   ESOPHAGOGASTRODUODENOSCOPY (EGD) WITH PROPOFOL N/A 03/25/2020   Procedure: ESOPHAGOGASTRODUODENOSCOPY (EGD) WITH PROPOFOL;  Surgeon: Rogene Houston, MD;  [facility];  Service: Endoscopy;  Laterality: N/A;   GIVENS CAPSULE STUDY N/A 04/13/2020   Procedure: GIVENS CAPSULE STUDY;  Surgeon: Daneil Dolin, MD;  [facility];  Service: Endoscopy;  Laterality: N/A;  7:30am   INSERTION OF MESH N/A 01/23/2013   Procedure: INSERTION OF MESH;  Surgeon: Adin Hector, MD;  Location: [facility];  Service: General;  Laterality: N/A;   LEFT HEART CATH AND CORONARY ANGIOGRAPHY N/A 08/27/2021   Procedure: LEFT HEART CATH AND CORONARY ANGIOGRAPHY;  Surgeon: Martinique, Peter M, MD;  Location: [facility];  Service: Cardiovascular;  Laterality: N/A;   POLYPECTOMY  03/26/2020   Procedure: POLYPECTOMY;  Surgeon: Daneil Dolin, MD;  [facility];  Service: Endoscopy;;   TONSILLECTOMY     as child   VENTRAL HERNIA REPAIR N/A 01/23/2013   Procedure: LAPAROSCOPIC MULTIPLE VENTRAL HERNIAS;  Surgeon: Adin Hector, MD;  Location: [facility];  Service: General;  Laterality: N/A;    Family History  Problem Relation Age of Onset   Depression Mother    Hearing loss Mother    Hyperlipidemia Mother    Hypertension Mother    Stroke Mother    Early death Father    Diabetes Brother    Pancreatic cancer Paternal  Uncle    Colon cancer Neg Hx     Social History   Socioeconomic History   Marital status: Married    Spouse name: Not on file   Number of children: Not on file   Years of education: Not on file   Highest education level: Not on file  Occupational History   Not on file  Tobacco Use   Smoking status: Every Day    Packs/day: 0.50    Years: 42.00    Total pack years: 21.00    Types: Cigarettes    Start date: 72   Smokeless tobacco: Never  Vaping Use   Vaping Use: Never used  Substance and Sexual Activity    Alcohol use: Yes    Comment: moderately   Drug use: Not Currently    Types: Cocaine    Comment: states he has tried it all. Last used cocaine a few months ago.    Sexual activity: Yes  Other Topics Concern   Not on file  Social History Narrative   Not on file   Social Determinants of Health   Financial Resource Strain: Low Risk  (04/20/2020)   Overall Financial Resource Strain (CARDIA)    Difficulty of Paying Living Expenses: Not hard at all  Food Insecurity: No Food Insecurity (04/20/2020)   Hunger Vital Sign    Worried About Running Out of Food in the Last Year: Never true    Ran Out of Food in the Last Year: Never true  Transportation Needs: No Transportation Needs (04/20/2020)   PRAPARE - Hydrologist (Medical): No    Lack of Transportation (Non-Medical): No  Physical Activity: Inactive (04/20/2020)   Exercise Vital Sign    Days of Exercise per Week: 0 days    Minutes of Exercise per Session: 0 min  Stress: Stress Concern Present (04/20/2020)   [place]    Feeling of Stress : Very much  Social Connections: Moderately Isolated (04/20/2020)   Social Connection and Isolation Panel [NHANES]    Frequency of Communication with Friends and Family: More than three times a week    Frequency of Social Gatherings with Friends and Family: More than three times a week    Attends Religious Services: Never    Marine scientist or Organizations: No    Attends Archivist Meetings: Never    Marital Status: Married  Human resources officer Violence: Not At Risk (04/20/2020)   Humiliation, Afraid, Rape, and Kick questionnaire    Fear of Current or Ex-Partner: No    Emotionally Abused: No    Physically Abused: No    Sexually Abused: No    Outpatient Medications Prior to Visit  Medication Sig Dispense Refill   apixaban (ELIQUIS) 5 MG TABS tablet Take 1 tablet (5 mg total) by mouth 2 (two)  times daily. 60 tablet 5   Ascorbic Acid (VITAMIN C PO) Take 500 mg by mouth daily.     esomeprazole (NEXIUM) 40 MG capsule Take 1 capsule (40 mg total) by mouth daily. 90 capsule 3   ferrous sulfate 325 (65 FE) MG EC tablet Take 1 tablet (325 mg total) by mouth 2 (two) times daily. 60 tablet 3   flecainide (TAMBOCOR) 100 MG tablet Take 1 tablet (100 mg total) by mouth every 12 (twelve) hours. 60 tablet 3   furosemide (LASIX) 20 MG tablet Take 1 tablet (20 mg total) by mouth daily. 90 tablet 3  Lactobacillus Rhamnosus, GG, (PROBIOTIC COLIC) LIQD Take 1 tablet by mouth 4 (four) times a week.     metoprolol succinate (TOPROL-XL) 100 MG 24 hr tablet Take 1 tablet (100 mg total) by mouth daily. 30 tablet 6   Multiple Vitamin (MULTIVITAMIN) tablet Take 1 tablet by mouth daily.     oxyCODONE-acetaminophen (PERCOCET/ROXICET) 5-325 MG tablet Take 1 tablet by mouth every 8 (eight) hours as needed for severe pain.     potassium chloride SA (KLOR-CON M) 10 MEQ tablet Take 1 tablet (10 mEq total) by mouth daily. 90 tablet 3   pravastatin (PRAVACHOL) 20 MG tablet Take one tablet three times per week. 30 tablet 1   vitamin B-12 (CYANOCOBALAMIN) 1000 MCG tablet Take 1,000 mcg by mouth daily.     metFORMIN (GLUCOPHAGE) 500 MG tablet Take 1 tablet (500 mg total) by mouth 2 (two) times daily with a meal. (Patient taking differently: Take 500 mg by mouth 2 (two) times daily.) 60 tablet 5   acetaminophen (TYLENOL) 650 MG CR tablet Take 1,300 mg by mouth every 8 (eight) hours as needed for pain. (Patient not taking: Reported on 12/02/2021)     albuterol (VENTOLIN HFA) 108 (90 Base) MCG/ACT inhaler INHALE 2 PUFFS INTO THE LUNGS EVERY 6 HOURS AS NEEDED FOR WHEEZING OR SHORTNESS OF BREATH (Patient not taking: Reported on 12/02/2021) 18 g 0   Turmeric 500 MG CAPS Take 500 mg by mouth daily. (Patient not taking: Reported on 12/02/2021)     sulfamethoxazole-trimethoprim (BACTRIM DS) 800-160 MG tablet Take 1 tablet by mouth 2  (two) times daily. (Patient not taking: Reported on 12/02/2021) 14 tablet 0   Facility-Administered Medications Prior to Visit  Medication Dose Route Frequency Provider Last Rate Last Admin   iron sucrose (VENOFER) 200 mg in sodium chloride 0.9 % 100 mL IVPB  200 mg Intravenous Once Pennington, Rebekah M, PA-C        Allergies  Allergen Reactions   Gabapentin Shortness Of Breath   Lyrica [Pregabalin] Shortness Of Breath   Crestor [Rosuvastatin] Other (See Comments)    myalgia   Farxiga [Dapagliflozin]     Developed UTI    ROS Review of Systems  Constitutional:  Negative for chills and fever.  HENT:  Negative for congestion, sinus pressure, sinus pain and sore throat.   Eyes:  Negative for pain and discharge.  Respiratory:  Negative for cough and shortness of breath.   Cardiovascular:  Negative for chest pain and palpitations.  Gastrointestinal:  Negative for diarrhea, nausea and vomiting.  Endocrine: Negative for polydipsia and polyuria.  Genitourinary:  Negative for dysuria and hematuria.  Musculoskeletal:  Positive for back pain. Negative for neck pain and neck stiffness.  Skin:  Negative for rash.  Neurological:  Negative for dizziness, weakness, numbness and headaches.  Psychiatric/Behavioral:  Negative for agitation and behavioral problems.       Objective:    Physical Exam Vitals reviewed.  Constitutional:      General: He is not in acute distress.    Appearance: He is not diaphoretic.  HENT:     Head: Normocephalic and atraumatic.     Comments: Enlarged left parotid gland    Nose: No congestion.     Mouth/Throat:     Mouth: Mucous membranes are moist.  Eyes:     General: No scleral icterus.    Extraocular Movements: Extraocular movements intact.  Cardiovascular:     Rate and Rhythm: Normal rate and regular rhythm.     Pulses: Normal  pulses.     Heart sounds: Normal heart sounds. No murmur heard. Pulmonary:     Breath sounds: Normal breath sounds. No  wheezing or rales.  Abdominal:     Palpations: Abdomen is soft.     Tenderness: There is no abdominal tenderness.  Musculoskeletal:     Cervical back: Neck supple. No tenderness.     Right lower leg: No edema.     Left lower leg: No edema.  Skin:    General: Skin is warm.     Findings: No rash.  Neurological:     General: No focal deficit present.     Mental Status: He is alert and oriented to person, place, and time.  Psychiatric:        Mood and Affect: Mood normal.        Behavior: Behavior normal.     BP 137/70   Ht _0  (1.753 m)   Wt 233 lb 12.8 oz (106.1 kg)   SpO2 96%   BMI 34.53 kg/m  Wt Readings from Last 3 Encounters:  12/02/21 233 lb 12.8 oz (106.1 kg)  11/24/21 232 lb 3.2 oz (105.3 kg)  11/15/21 235 lb 3.7 oz (106.7 kg)    Lab Results  Component Value Date   TSH 1.900 08/24/2021   Lab Results  Component Value Date   WBC 6.1 11/16/2021   HGB 13.7 11/16/2021   HCT 40.3 11/16/2021   MCV 92.4 11/16/2021   PLT 118 (L) 11/16/2021   Lab Results  Component Value Date   NA 139 11/17/2021   K 4.2 11/17/2021   CO2 23 11/17/2021   GLUCOSE 188 (H) 11/17/2021   BUN 13 11/17/2021   CREATININE 0.66 11/17/2021   BILITOT <0.2 09/14/2021   ALKPHOS 119 09/14/2021   AST 17 09/14/2021   ALT 20 09/14/2021   PROT 7.2 09/14/2021   ALBUMIN 4.5 09/14/2021   CALCIUM 9.2 11/17/2021   ANIONGAP 15 11/17/2021   EGFR 81 11/13/2021   Lab Results  Component Value Date   CHOL 159 02/13/2019   Lab Results  Component Value Date   HDL 38 (L) 02/13/2019   Lab Results  Component Value Date   LDLCALC 97 02/13/2019   Lab Results  Component Value Date   TRIG 140 02/13/2019   Lab Results  Component Value Date   CHOLHDL 4.2 02/13/2019   Lab Results  Component Value Date   HGBA1C 8.5 (H) 10/06/2021      Assessment & Plan:   Problem List Items Addressed This Visit       Cardiovascular and Mediastinum   Atrial fibrillation with RVR  - Primary    Rate  controlled with metoprolol now On flecainide 100 mg daily In sinus rhythm now On Eliquis for [facility] Follow-up with cardiology S/p cardioversion        Respiratory   Chronic obstructive pulmonary disease (HCC)    Uses albuterol as needed for dyspnea or wheezing, does not need it daily If frequent use needed, will add maintenance inhaler for COPD        Endocrine   Type 2 diabetes mellitus with other specified complication ([place])    Lab Results  Component Value Date   HGBA1C 8.5 (H) 10/06/2021  Uncontrolled On Metformin, increased dose to 1000 mg twice daily Advised to follow diabetic diet F/u CMP and lipid panel On statin and ARB now Diabetic eye exam: Advised to follow up with Ophthalmology for diabetic eye exam      Relevant  Medications   metFORMIN (GLUCOPHAGE) 1000 MG tablet     Other   Hospital discharge follow-up    Hospital chart reviewed, including discharge summary  A-fib with RVR, on beta-blocker and Eliquis now On metformin for DM and Crestor for HLD now Medications reconciled and reviewed with the patient in detail      Other Visit Diagnoses     Acute cystitis without hematuria     Resolved with Bactrim       Meds ordered this encounter  Medications   metFORMIN (GLUCOPHAGE) 1000 MG tablet    Sig: Take 1 tablet (1,000 mg total) by mouth 2 (two) times daily with a meal.    Dispense:  60 tablet    Refill:  5    Follow-up: Return in about 3 months (around 03/04/2022) for Annual physical.    Lindell Spar, MD

## 2021-12-02 NOTE — Assessment & Plan Note (Addendum)
Rate controlled with metoprolol now On flecainide 100 mg daily In sinus rhythm now On Eliquis for AC Follow-up with cardiology S/p cardioversion 

## 2021-12-02 NOTE — Assessment & Plan Note (Signed)
Hospital chart reviewed, including discharge summary  A-fib with RVR, on beta-blocker and Eliquis now On metformin for DM and Crestor for HLD now Medications reconciled and reviewed with the patient in detail

## 2021-12-02 NOTE — Patient Instructions (Signed)
Please start taking Metformin 1000 mg twice daily instead of 500 mg.  Please continue to follow low carb diet and ambulate as tolerated.  Please take at least 64 ounces of fluid in a day.

## 2021-12-02 NOTE — Assessment & Plan Note (Signed)
Uses albuterol as needed for dyspnea or wheezing, does not need it daily If frequent use needed, will add maintenance inhaler for COPD

## 2021-12-02 NOTE — Assessment & Plan Note (Signed)
Lab Results  Component Value Date   HGBA1C 8.5 (H) 10/06/2021   Uncontrolled On Metformin, increased dose to 1000 mg twice daily Advised to follow diabetic diet F/u CMP and lipid panel On statin and ARB now Diabetic eye exam: Advised to follow up with Ophthalmology for diabetic eye exam

## 2021-12-15 NOTE — Progress Notes (Signed)
Cardiology Office Note Date:  12/15/2021  Patient ID:  Stephen Roberts, Stephen Roberts July 18, 1965, MRN [id number] PCP:  Lindell Spar, MD  Cardiologist:  Dr. Gasper Sells Electrophysiologist: Dr. Curt Bears    Chief Complaint: post hospital  History of Present Illness: Stephen Roberts is a 56 y.o. male with history of AFib, PVCs, NICM, DM  He was hospitalized July 2023 at [facility] and transferred to [facility] with persistent Afib, making him feel weak, fatigued, short of breath.  Hx of ERAF after a couple DCCVs. EP was consulted and started on flecainide for his Afib and PVCs. DCCV 11/18/21 on flecainide and discharged He was also treated for UTI  He saw D. Carroll, NP7/12/23, maintaining SR, 1st degree AVblock with some PVCs.  No changes were made. He had urology f/u in place He felt improved but stil generally tired, pending to see  Dr. Radford Pax to revisit alternative management strategies for his apnea.  TODAY He is accompanied by his wife His palpitations are not resolved but are significantly improved. He will have fleeting palpitations that creat a heaviness perhaps in his chect. Nothing at all like before, none lasting more then a few seconds or so. No CP otherwise No SOB Near syncope or syncope. When he has palpitations they make him feel tired,  but this is as well much better  When he checks his Jodelle Red (just to check in) now he often will have tracings without PVCs at all Doesn't think he has had any Afib  He mentions he developed a severe UTI on Farxiga and was stopped, occasionally will get a fleeting penile discomfort, no hematuria He sees his PMD next week  He sees Dr. Radford Pax in a couple weeks to re-evaluate other apnea management strategies.  He is hoping he will be an [place] candidate  Infrequently gets some bleeding gums with brushing but knows he needs dental work done and this is not new or escalating, no bleeding or signs of bleeding otherwise   AFib Hx Flecainide started  July 2023 (AFib and PVCs)   Past Medical History:  Diagnosis Date   A-fib ([place])    Arthritis    Diabetes ([place])    GERD (gastroesophageal reflux disease)    Heart murmur    Pneumonia 06/27/2006    Past Surgical History:  Procedure Laterality Date   ANTERIOR FUSION CERVICAL SPINE     CARDIOVERSION N/A 10/05/2021   Procedure: CARDIOVERSION;  Surgeon: Sanda Klein, MD;  Location: [place];  Service: Cardiovascular;  Laterality: N/A;   CARDIOVERSION N/A 11/18/2021   Procedure: CARDIOVERSION;  Surgeon: Berniece Salines, DO;  Location: [place];  Service: Cardiovascular;  Laterality: N/A;   COLONOSCOPY WITH PROPOFOL N/A 03/26/2020   Procedure: COLONOSCOPY WITH PROPOFOL;  Surgeon: Daneil Dolin, MD;  [facility];  Service: Endoscopy;  Laterality: N/A;   disc in neck Left 05/2017   ESOPHAGEAL DILATION N/A 03/25/2020   Procedure: ESOPHAGEAL DILATION;  Surgeon: Rogene Houston, MD;  [facility];  Service: Endoscopy;  Laterality: N/A;   ESOPHAGOGASTRODUODENOSCOPY (EGD) WITH PROPOFOL N/A 03/25/2020   Procedure: ESOPHAGOGASTRODUODENOSCOPY (EGD) WITH PROPOFOL;  Surgeon: Rogene Houston, MD;  [facility];  Service: Endoscopy;  Laterality: N/A;   GIVENS CAPSULE STUDY N/A 04/13/2020   Procedure: GIVENS CAPSULE STUDY;  Surgeon: Daneil Dolin, MD;  [facility];  Service: Endoscopy;  Laterality: N/A;  7:30am   INSERTION OF MESH N/A 01/23/2013   Procedure: INSERTION OF MESH;  Surgeon: Renelda Loma  Alyssa Grove, MD;  [facility];  Service: General;  Laterality: N/A;   LEFT HEART CATH AND CORONARY ANGIOGRAPHY N/A 08/27/2021   Procedure: LEFT HEART CATH AND CORONARY ANGIOGRAPHY;  Surgeon: Martinique, Peter M, MD;  Location: [place] [facility];  Service: Cardiovascular;  Laterality: N/A;   POLYPECTOMY  03/26/2020   Procedure: POLYPECTOMY;  Surgeon: Daneil Dolin, MD;  [facility];  Service: Endoscopy;;   TONSILLECTOMY     as child   VENTRAL  HERNIA REPAIR N/A 01/23/2013   Procedure: LAPAROSCOPIC MULTIPLE VENTRAL HERNIAS;  Surgeon: Adin Hector, MD;  [facility];  Service: General;  Laterality: N/A;    Current Outpatient Medications  Medication Sig Dispense Refill   acetaminophen (TYLENOL) 650 MG CR tablet Take 1,300 mg by mouth every 8 (eight) hours as needed for pain. (Patient not taking: Reported on 12/02/2021)     albuterol (VENTOLIN HFA) 108 (90 Base) MCG/ACT inhaler INHALE 2 PUFFS INTO THE LUNGS EVERY 6 HOURS AS NEEDED FOR WHEEZING OR SHORTNESS OF BREATH (Patient not taking: Reported on 12/02/2021) 18 g 0   apixaban (ELIQUIS) 5 MG TABS tablet Take 1 tablet (5 mg total) by mouth 2 (two) times daily. 60 tablet 5   Ascorbic Acid (VITAMIN C PO) Take 500 mg by mouth daily.     esomeprazole (NEXIUM) 40 MG capsule Take 1 capsule (40 mg total) by mouth daily. 90 capsule 3   ferrous sulfate 325 (65 FE) MG EC tablet Take 1 tablet (325 mg total) by mouth 2 (two) times daily. 60 tablet 3   flecainide (TAMBOCOR) 100 MG tablet Take 1 tablet (100 mg total) by mouth every 12 (twelve) hours. 60 tablet 3   furosemide (LASIX) 20 MG tablet Take 1 tablet (20 mg total) by mouth daily. 90 tablet 3   Lactobacillus Rhamnosus, GG, (PROBIOTIC COLIC) LIQD Take 1 tablet by mouth 4 (four) times a week.     metFORMIN (GLUCOPHAGE) 1000 MG tablet Take 1 tablet (1,000 mg total) by mouth 2 (two) times daily with a meal. 60 tablet 5   metoprolol succinate (TOPROL-XL) 100 MG 24 hr tablet Take 1 tablet (100 mg total) by mouth daily. 30 tablet 6   Multiple Vitamin (MULTIVITAMIN) tablet Take 1 tablet by mouth daily.     oxyCODONE-acetaminophen (PERCOCET/ROXICET) 5-325 MG tablet Take 1 tablet by mouth every 8 (eight) hours as needed for severe pain.     potassium chloride SA (KLOR-CON M) 10 MEQ tablet Take 1 tablet (10 mEq total) by mouth daily. 90 tablet 3   pravastatin (PRAVACHOL) 20 MG tablet Take one tablet three times per week. 30 tablet 1   Turmeric 500 MG  CAPS Take 500 mg by mouth daily. (Patient not taking: Reported on 12/02/2021)     vitamin B-12 (CYANOCOBALAMIN) 1000 MCG tablet Take 1,000 mcg by mouth daily.     No current facility-administered medications for this visit.   Facility-Administered Medications Ordered in Other Visits  Medication Dose Route Frequency Provider Last Rate Last Admin   iron sucrose (VENOFER) 200 mg in sodium chloride 0.9 % 100 mL IVPB  200 mg Intravenous Once Pennington, Rebekah M, PA-C        Allergies:   Gabapentin, Lyrica [pregabalin], Crestor [rosuvastatin], and Farxiga [dapagliflozin]   Social History:  The patient  reports that he has been smoking cigarettes. He started smoking about 43 years ago. He has a 21.00 pack-year smoking history. He has never used smokeless tobacco. He reports current alcohol  use. He reports that he does not currently use drugs after having used the following drugs: Cocaine.   Family History:  The patient's family history includes Depression in his mother; Diabetes in his brother; Early death in his father; Hearing loss in his mother; Hyperlipidemia in his mother; Hypertension in his mother; Pancreatic cancer in his paternal uncle; Stroke in his mother.  ROS:  Please see the history of present illness.    All other systems are reviewed and otherwise negative.   PHYSICAL EXAM:  VS:  There were no vitals taken for this visit. BMI: There is no height or weight on file to calculate BMI. Well nourished, well developed, in no acute distress HEENT: normocephalic, atraumatic Neck: no JVD, carotid bruits or masses Cardiac:  RRR; ONE extrasystole noted with prolonged auscultation no significant murmurs, no rubs, or gallops Lungs:  CTA b/l, no wheezing, rhonchi or rales Abd: soft, nontender MS: no deformity or atrophy Ext: no edema Skin: warm and dry, no rash Neuro:  No gross deficits appreciated Psych: euthymic mood, full affect   EKG:  Done today and reviewed by myself shows  SR  69bpm, PR 131m, QRS 1169m QTc 44750m2 PVCs  LHC 08/27/21 Mild nonobstructive CAD Normal LVEDP   TTE 08/25/21  1. Left ventricular ejection fraction, by estimation, is 45 to 50%. The  left ventricle has mildly decreased function. The left ventricle  demonstrates global hypokinesis. There is mild left ventricular  hypertrophy. Left ventricular diastolic parameters  are indeterminate.   2. Right ventricular systolic function is normal. The right ventricular  size is normal. There is normal pulmonary artery systolic pressure.   3. Left atrial size was severely dilated.   4. Right atrial size was moderately dilated.   5. The mitral valve is normal in structure. Trivial mitral valve  regurgitation. No evidence of mitral stenosis.   6. The aortic valve is tricuspid. Aortic valve regurgitation is not  visualized. Aortic valve sclerosis is present, with no evidence of aortic  valve stenosis.   7. The inferior vena cava is dilated in size with >50% respiratory  variability, suggesting right atrial pressure of 8 mmHg.   Recent Labs: 08/24/2021: TSH 1.900 09/14/2021: ALT 20 09/28/2021: BNP 210.8 11/16/2021: Hemoglobin 13.7; Magnesium 1.8; Platelets 118 11/17/2021: BUN 13; Creatinine, Ser 0.66; Potassium 4.2; Sodium 139  No results found for requested labs within last 365 days.   CrCl cannot be calculated (Patient's most recent lab result is older than the maximum 21 days allowed.).   Wt Readings from Last 3 Encounters:  12/02/21 233 lb 12.8 oz (106.1 kg)  11/24/21 232 lb 3.2 oz (105.3 kg)  11/15/21 235 lb 3.7 oz (106.7 kg)     Other studies reviewed: Additional studies/records reviewed today include: summarized above  ASSESSMENT AND PLAN:  Persistent Afib CHA2DS2Vasc is 2, on Eliquis PVCs Flecainide and metoprolol stable intervals   Much improved burden of both I think if he is able to get his apnea adequately treated this may help further improve his ectopy/arrhythmia burden We  have some room on both flecainide and Toprol if we need to totrate, but would like to wait until apnea is treated 1st   CM Suspect NICM 2/2 AF/PVCs Failed Farxiga with UTI C/w gen cardiology team No symptoms or exam findings of volume OL   Disposition: F/u with cardiology as scheduled, we can move out his EP visit a few months  Current medicines are reviewed at length with the patient today.  The  patient did not have any concerns regarding medicines.  Venetia Night, PA-C 12/15/2021 1:52 PM     [address] [place] [address] [phone] (office)  [phone] (fax)

## 2021-12-20 ENCOUNTER — Telehealth: Payer: Self-pay | Admitting: *Deleted

## 2021-12-20 ENCOUNTER — Encounter: Payer: Self-pay | Admitting: Physician Assistant

## 2021-12-20 ENCOUNTER — Ambulatory Visit: Payer: PPO | Admitting: Physician Assistant

## 2021-12-20 VITALS — BP 130/80 | HR 69 | Ht 69.0 in | Wt 237.6 lb

## 2021-12-20 DIAGNOSIS — I4819 Other persistent atrial fibrillation: Secondary | ICD-10-CM | POA: Diagnosis not present

## 2021-12-20 DIAGNOSIS — I428 Other cardiomyopathies: Secondary | ICD-10-CM | POA: Diagnosis not present

## 2021-12-20 DIAGNOSIS — I493 Ventricular premature depolarization: Secondary | ICD-10-CM

## 2021-12-20 NOTE — Telephone Encounter (Signed)
Patient will discuss his options with Dr Radford Pax at his 8/23/ appointment.

## 2021-12-20 NOTE — Telephone Encounter (Signed)
Called to offer August PREP class at [place]. Accepted Tues/Thurs. [phone] beginning 01/04/2022. Intake assessment scheduled 12/30/2021 at 1200.

## 2021-12-20 NOTE — Patient Instructions (Addendum)
Medication Instructions:   Your physician recommends that you continue on your current medications as directed. Please refer to the Current Medication list given to you today.  *If you need a refill on your cardiac medications before your next appointment, please call your pharmacy*   Lab Work:  [place]   If you have labs (blood work) drawn today and your tests are completely normal, you will receive your results only by: [place] (if you have MyChart) OR A paper copy in the mail If you have any lab test that is abnormal or we need to change your treatment, we will call you to review the results.   Testing/Procedures: NONE ORDERED  TODAY    Follow-Up: At [facility], you and your health needs are our priority.  As part of our continuing mission to provide you with exceptional heart care, we have created designated Provider Care Teams.  These Care Teams include your primary Cardiologist (physician) and Advanced Practice Providers (APPs -  Physician Assistants and Nurse Practitioners) who all work together to provide you with the care you need, when you need it.  We recommend signing up for the patient portal called "MyChart".  Sign up information is provided on this After Visit Summary.  MyChart is used to connect with patients for Virtual Visits (Telemedicine).  Patients are able to view lab/test results, encounter notes, upcoming appointments, etc.  Non-urgent messages can be sent to your provider as well.   To learn more about what you can do with MyChart, go to [email or web address].    Your next appointment:  PUSH OUR CURRENT APPOINTMENT  12-2021  3 month(s)( CONTACT [facility] FOR EP SCHEDULING ISSUES )   The format for your next appointment:   In Person  Provider:   Allegra Lai, MD    Other Instructions   Important Information About Sugar

## 2021-12-23 ENCOUNTER — Telehealth: Payer: Self-pay

## 2021-12-23 NOTE — Telephone Encounter (Signed)
Left a message to call back. Pt saw RU on 12/20/21 due to see TT on 01/05/22 for sleep apnea.  Dr. Gasper Sells wants to know if pt is having issues that he needs to address. If not pt can cancel OV for 12/28/21.

## 2021-12-27 ENCOUNTER — Other Ambulatory Visit: Payer: Self-pay | Admitting: Internal Medicine

## 2021-12-27 NOTE — Telephone Encounter (Signed)
Spoke with pt who reports feels okay just needs to get OSA looked at.  Does not feel needs OV with Dr. Gasper Sells for 12/28/21.  OV canceled.  Next OV 01/05/22 with Dr. Radford Pax for sleep apnea.

## 2021-12-28 ENCOUNTER — Ambulatory Visit: Payer: PPO | Admitting: Internal Medicine

## 2021-12-29 ENCOUNTER — Institutional Professional Consult (permissible substitution): Payer: PPO | Admitting: Cardiology

## 2021-12-30 ENCOUNTER — Encounter: Payer: Self-pay | Admitting: *Deleted

## 2021-12-30 NOTE — Progress Notes (Signed)
[facility] PREP Evaluation  Patient Details  Name: Stephen Roberts MRN: [id number] Date of Birth: 23-May-1965 Age: 56 y.o. PCP: Lindell Spar, MD  Vitals:   12/30/21 1230  BP: (!) 136/58  Pulse: 70  SpO2: 94%  Weight: 237 lb (107.5 kg)  Height: '5\' 9"'$  (1.753 m)     [facility] Eval - 12/30/21 1500       [facility] "PREP" Location   [facility] "PREP" Location [facility]      Referral    Referring Provider C. [place]    Reason for referral Diabetes;Current Smoker;Hypertension;Inactivity;Obesitity/Overweight   A-fib, COPD   Program Start Date 01/04/22      Measurement   Waist Circumference 48 inches    Hip Circumference 47.25 inches    Body fat 34.1 percent      Information for Trainer   Goals establish exercise routine, weight loss 10-12 lbs in 12 weeks, Reduce current 2-3 cigarettes/day to 0    Current Exercise walking    Orthopedic Concerns decreased neck flexability,right knee arthritis   self reported   Current Barriers none    Restrictions/Precautions Diabetic snack before exercise    Medications that affect exercise Beta blocker      Mobility and Daily Activities   I find it easy to walk up or down two or more flights of stairs. 2    I have no trouble taking out the trash. 3    I do housework such as vacuuming and dusting on my own without difficulty. 3    I can easily lift a gallon of milk (8lbs). 3    I can easily walk a mile. 1    I have no trouble reaching into high cupboards or reaching down to pick up something from the floor. 1    I do not have trouble doing out-door work such as Armed forces logistics/support/administrative officer, raking leaves, or gardening. 2      Mobility and Daily Activities   I feel younger than my age. 1    I feel independent. 2    I feel energetic. 1    I live an active life.  1    I feel strong. 2    I feel healthy. 1    I feel active as other people my age. 2      How fit and strong are you.   Fit and Strong Total Score 25            Past Medical History:   Diagnosis Date   A-fib ([facility])    Arthritis    Diabetes ([facility])    GERD (gastroesophageal reflux disease)    Heart murmur    Pneumonia 06/27/2006   Past Surgical History:  Procedure Laterality Date   ANTERIOR FUSION CERVICAL SPINE     CARDIOVERSION N/A 10/05/2021   Procedure: CARDIOVERSION;  Surgeon: Sanda Klein, MD;  Location: [place];  Service: Cardiovascular;  Laterality: N/A;   CARDIOVERSION N/A 11/18/2021   Procedure: CARDIOVERSION;  Surgeon: Berniece Salines, DO;  Location: [place];  Service: Cardiovascular;  Laterality: N/A;   COLONOSCOPY WITH PROPOFOL N/A 03/26/2020   Procedure: COLONOSCOPY WITH PROPOFOL;  Surgeon: Daneil Dolin, MD;  [facility];  Service: Endoscopy;  Laterality: N/A;   disc in neck Left 05/2017   ESOPHAGEAL DILATION N/A 03/25/2020   Procedure: ESOPHAGEAL DILATION;  Surgeon: Rogene Houston, MD;  [facility];  Service: Endoscopy;  Laterality: N/A;   ESOPHAGOGASTRODUODENOSCOPY (EGD) WITH PROPOFOL N/A 03/25/2020  Procedure: ESOPHAGOGASTRODUODENOSCOPY (EGD) WITH PROPOFOL;  Surgeon: Rogene Houston, MD;  [facility];  Service: Endoscopy;  Laterality: N/A;   GIVENS CAPSULE STUDY N/A 04/13/2020   Procedure: GIVENS CAPSULE STUDY;  Surgeon: Daneil Dolin, MD;  [facility];  Service: Endoscopy;  Laterality: N/A;  7:30am   INSERTION OF MESH N/A 01/23/2013   Procedure: INSERTION OF MESH;  Surgeon: Adin Hector, MD;  [facility];  Service: General;  Laterality: N/A;   LEFT HEART CATH AND CORONARY ANGIOGRAPHY N/A 08/27/2021   Procedure: LEFT HEART CATH AND CORONARY ANGIOGRAPHY;  Surgeon: Martinique, Peter M, MD;  Location: [place] [facility];  Service: Cardiovascular;  Laterality: N/A;   POLYPECTOMY  03/26/2020   Procedure: POLYPECTOMY;  Surgeon: Daneil Dolin, MD;  [facility];  Service: Endoscopy;;   TONSILLECTOMY     as child   VENTRAL HERNIA REPAIR N/A 01/23/2013   Procedure: LAPAROSCOPIC  MULTIPLE VENTRAL HERNIAS;  Surgeon: Adin Hector, MD;  [facility];  Service: General;  Laterality: N/A;   Social History   Tobacco Use  Smoking Status Every Day   Packs/day: 0.50   Years: 42.00   Total pack years: 21.00   Types: Cigarettes   Start date: [place] Never    [place] 12/30/2021, 3:35 PM

## 2022-01-03 ENCOUNTER — Other Ambulatory Visit (HOSPITAL_BASED_OUTPATIENT_CLINIC_OR_DEPARTMENT_OTHER): Payer: Self-pay | Admitting: Family

## 2022-01-03 DIAGNOSIS — E782 Mixed hyperlipidemia: Secondary | ICD-10-CM

## 2022-01-03 NOTE — Telephone Encounter (Signed)
Pt of Dr. Gasper Sells. Please review for refill. Thank you!

## 2022-01-05 ENCOUNTER — Ambulatory Visit: Payer: PPO | Admitting: Cardiology

## 2022-01-11 ENCOUNTER — Telehealth: Payer: Self-pay | Admitting: *Deleted

## 2022-01-12 ENCOUNTER — Encounter: Payer: Self-pay | Admitting: Gastroenterology

## 2022-01-12 ENCOUNTER — Ambulatory Visit (INDEPENDENT_AMBULATORY_CARE_PROVIDER_SITE_OTHER): Payer: PPO | Admitting: Gastroenterology

## 2022-01-12 VITALS — BP 135/90 | HR 74 | Temp 97.1°F | Ht 69.0 in | Wt 239.8 lb

## 2022-01-12 DIAGNOSIS — D500 Iron deficiency anemia secondary to blood loss (chronic): Secondary | ICD-10-CM

## 2022-01-12 DIAGNOSIS — K219 Gastro-esophageal reflux disease without esophagitis: Secondary | ICD-10-CM

## 2022-01-12 DIAGNOSIS — K449 Diaphragmatic hernia without obstruction or gangrene: Secondary | ICD-10-CM | POA: Insufficient documentation

## 2022-01-12 NOTE — Telephone Encounter (Signed)
Call to patient yesterday 01/11/2022, to inquire if he was still interested in attending PREP Class. He has missed the first three classes of this session. Left voice mail message.

## 2022-01-12 NOTE — Progress Notes (Signed)
GI Office Note    Referring Provider: Lindell Spar, MD Primary Care Physician:  Lindell Spar, MD  Primary Gastroenterologist:Michael Gala Romney, MD   Chief Complaint   Chief Complaint  Patient presents with   Gastroesophageal Reflux   Anemia    History of Present Illness   Stephen Roberts is a 56 y.o. male presenting today at the request of Dr. Posey Pronto for further evaluation of IDA, GERD.  Followed by hematology for intermittent iron infusions.  Last infusion July 2023.  Diagnosed with A-fib back in April, now on Eliquis.  Hospitalized due to A-fib with RVR in July 2023.  Received cardioversion.  History of large hiatal hernia. Patient was seen previously in November 2021 during hospitalization for profound IDA, GERD/dysphagia. See below for EGD/colonoscopy/VCE findings.  Patient states he is doing well. Said he was having acid reflux/heartburn however doing well since on esomeprazole '40mg'$  daily. Says he has been on medication off/on either OTC or RX. He is not familiar with medication names, wife fills his pill box for him. List obtained from chart. BM regular. No melena, brbpr. No abdominal pain. No unintentional weight loss. No dysphagia.   He denies aspirin use but does alternate Tylenol and ibuprofen for joint pain. Taking at least 6-7 times per week, one or the other. Typically takes '600mg'$  of ibuprofen at a time.   History of intermittent chronic cocaine use, patient reports infrequent, last use over two months ago.   EGD November 2021: - Normal hypopharynx. - Normal esophagus. - Z-line regular, 35 cm from the incisors. - 7 cm hiatal hernia. - Erythematous gastric mucosa at the level of hiatus.. - Normal duodenal bulb and second portion of the duodenum. - No specimens collected. Comment: No structural abnormality noted to esophagus. Therefore was not dilated. Sliding hiatal hernia is large.  Colonoscopy November 2021: - Five 4 to 7 mm polyps in the rectum, in the  mid rectum, in the descending colon and at the hepatic flexure, removed with a cold snare. Resected and retrieved. -Multiple tubular adenomas.  Next colonoscopy in 5 years.  Video capsule endoscopy (small bowel) October 2021: Distal small bowel difficult to evaluate due to debris and not adequately seen.  Small lesions could be missed due to obscured view.  Otherwise significant findings.  Medications   Current Outpatient Medications  Medication Sig Dispense Refill   acetaminophen (TYLENOL) 650 MG CR tablet Take 1,300 mg by mouth every 8 (eight) hours as needed for pain.     albuterol (VENTOLIN HFA) 108 (90 Base) MCG/ACT inhaler INHALE 2 PUFFS INTO THE LUNGS EVERY 6 HOURS AS NEEDED FOR WHEEZING OR SHORTNESS OF BREATH 18 g 0   apixaban (ELIQUIS) 5 MG TABS tablet Take 1 tablet (5 mg total) by mouth 2 (two) times daily. 60 tablet 5   Ascorbic Acid (VITAMIN C PO) Take 500 mg by mouth daily.     esomeprazole (NEXIUM) 40 MG capsule Take 1 capsule (40 mg total) by mouth daily. 90 capsule 3   ferrous sulfate 325 (65 FE) MG EC tablet TAKE 1 TABLET(325 MG) BY MOUTH TWICE DAILY 60 tablet 3   flecainide (TAMBOCOR) 100 MG tablet Take 1 tablet (100 mg total) by mouth every 12 (twelve) hours. 60 tablet 3   furosemide (LASIX) 20 MG tablet Take 1 tablet (20 mg total) by mouth daily. 90 tablet 3   Lactobacillus Rhamnosus, GG, (PROBIOTIC COLIC) LIQD Take 1 tablet by mouth 4 (four) times a week.  metFORMIN (GLUCOPHAGE) 1000 MG tablet Take 1 tablet (1,000 mg total) by mouth 2 (two) times daily with a meal. 60 tablet 5   metoprolol succinate (TOPROL-XL) 100 MG 24 hr tablet Take 1 tablet (100 mg total) by mouth daily. 30 tablet 6   Multiple Vitamin (MULTIVITAMIN) tablet Take 1 tablet by mouth daily.     oxyCODONE-acetaminophen (PERCOCET/ROXICET) 5-325 MG tablet Take 1 tablet by mouth every 8 (eight) hours as needed for severe pain.     potassium chloride SA (KLOR-CON M) 10 MEQ tablet Take 1 tablet (10 mEq total)  by mouth daily. 90 tablet 3   pravastatin (PRAVACHOL) 20 MG tablet TAKE 1 TABLET BY MOUTH 3 TIMES EVERY WEEK 15 tablet 10   vitamin B-12 (CYANOCOBALAMIN) 1000 MCG tablet Take 1,000 mcg by mouth daily.     No current facility-administered medications for this visit.   Facility-Administered Medications Ordered in Other Visits  Medication Dose Route Frequency Provider Last Rate Last Admin   iron sucrose (VENOFER) 200 mg in sodium chloride 0.9 % 100 mL IVPB  200 mg Intravenous Once Pennington, Rebekah M, PA-C        Allergies   Allergies as of 01/12/2022 - Review Complete 01/12/2022  Allergen Reaction Noted   Gabapentin Shortness Of Breath 04/28/2017   Lyrica [pregabalin] Shortness Of Breath 04/28/2017   Crestor [rosuvastatin] Other (See Comments) 10/26/2021   Farxiga [dapagliflozin]  11/14/2021     Past Medical History   Past Medical History:  Diagnosis Date   A-fib (HCC)    Arthritis    Diabetes ([place])    GERD (gastroesophageal reflux disease)    Heart murmur    IDA (iron deficiency anemia)    Pneumonia 06/27/2006    Past Surgical History   Past Surgical History:  Procedure Laterality Date   ANTERIOR FUSION CERVICAL SPINE     CARDIOVERSION N/A 10/05/2021   Procedure: CARDIOVERSION;  Surgeon: Sanda Klein, MD;  Location: [facility];  Service: Cardiovascular;  Laterality: N/A;   CARDIOVERSION N/A 11/18/2021   Procedure: CARDIOVERSION;  Surgeon: Berniece Salines, DO;  Location: [facility];  Service: Cardiovascular;  Laterality: N/A;   COLONOSCOPY WITH PROPOFOL N/A 03/26/2020   Procedure: COLONOSCOPY WITH PROPOFOL;  Surgeon: Daneil Dolin, MD;  [facility];  Service: Endoscopy;  Laterality: N/A;   disc in neck Left 05/2017   ESOPHAGEAL DILATION N/A 03/25/2020   Procedure: ESOPHAGEAL DILATION;  Surgeon: Rogene Houston, MD;  [facility];  Service: Endoscopy;  Laterality: N/A;   ESOPHAGOGASTRODUODENOSCOPY (EGD) WITH PROPOFOL N/A 03/25/2020    Procedure: ESOPHAGOGASTRODUODENOSCOPY (EGD) WITH PROPOFOL;  Surgeon: Rogene Houston, MD;  [facility];  Service: Endoscopy;  Laterality: N/A;   GIVENS CAPSULE STUDY N/A 04/13/2020   Procedure: GIVENS CAPSULE STUDY;  Surgeon: Daneil Dolin, MD;  [facility];  Service: Endoscopy;  Laterality: N/A;  7:30am   INSERTION OF MESH N/A 01/23/2013   Procedure: INSERTION OF MESH;  Surgeon: Adin Hector, MD;  Location: [facility];  Service: General;  Laterality: N/A;   LEFT HEART CATH AND CORONARY ANGIOGRAPHY N/A 08/27/2021   Procedure: LEFT HEART CATH AND CORONARY ANGIOGRAPHY;  Surgeon: Martinique, Peter M, MD;  Location: [facility];  Service: Cardiovascular;  Laterality: N/A;   POLYPECTOMY  03/26/2020   Procedure: POLYPECTOMY;  Surgeon: Daneil Dolin, MD;  [facility];  Service: Endoscopy;;   TONSILLECTOMY     as child   VENTRAL HERNIA REPAIR N/A 01/23/2013  Procedure: LAPAROSCOPIC MULTIPLE VENTRAL HERNIAS;  Surgeon: Adin Hector, MD;  Location: [facility];  Service: General;  Laterality: N/A;    Past Family History   Family History  Problem Relation Age of Onset   Depression Mother    Hearing loss Mother    Hyperlipidemia Mother    Hypertension Mother    Stroke Mother    Early death Father    Diabetes Brother    Pancreatic cancer Paternal Uncle    Colon cancer Neg Hx     Past Social History   Social History   Socioeconomic History   Marital status: Married    Spouse name: Not on file   Number of children: Not on file   Years of education: Not on file   Highest education level: Not on file  Occupational History   Not on file  Tobacco Use   Smoking status: Every Day    Packs/day: 0.50    Years: 42.00    Total pack years: 21.00    Types: Cigarettes    Start date: 75   Smokeless tobacco: Never  Vaping Use   Vaping Use: Never used  Substance and Sexual Activity   Alcohol use: Yes    Alcohol/week: 4.0 standard drinks of alcohol     Types: 4 Cans of beer per week    Comment: moderately   Drug use: Not Currently    Types: Cocaine    Comment: states he has tried it all. Last used cocaine a few months ago.    Sexual activity: Yes  Other Topics Concern   Not on file  Social History Narrative   Not on file   Social Determinants of Health   Financial Resource Strain: Low Risk  (04/20/2020)   Overall Financial Resource Strain (CARDIA)    Difficulty of Paying Living Expenses: Not hard at all  Food Insecurity: No Food Insecurity (04/20/2020)   Hunger Vital Sign    Worried About Running Out of Food in the Last Year: Never true    Ran Out of Food in the Last Year: Never true  Transportation Needs: No Transportation Needs (04/20/2020)   PRAPARE - Hydrologist (Medical): No    Lack of Transportation (Non-Medical): No  Physical Activity: Inactive (04/20/2020)   Exercise Vital Sign    Days of Exercise per Week: 0 days    Minutes of Exercise per Session: 0 min  Stress: Stress Concern Present (04/20/2020)   [place]    Feeling of Stress : Very much  Social Connections: Moderately Isolated (04/20/2020)   Social Connection and Isolation Panel [NHANES]    Frequency of Communication with Friends and Family: More than three times a week    Frequency of Social Gatherings with Friends and Family: More than three times a week    Attends Religious Services: Never    Marine scientist or Organizations: No    Attends Archivist Meetings: Never    Marital Status: Married  Human resources officer Violence: Not At Risk (04/20/2020)   Humiliation, Afraid, Rape, and Kick questionnaire    Fear of Current or Ex-Partner: No    Emotionally Abused: No    Physically Abused: No    Sexually Abused: No    Review of Systems   General: Negative for anorexia, weight loss, fever, chills, +fatigue, weakness. ENT: Negative for hoarseness,  difficulty swallowing , nasal congestion. CV: Negative for chest pain, angina, palpitations,  dyspnea on exertion, peripheral edema.  Respiratory: Negative for dyspnea at rest, dyspnea on exertion, cough, sputum, wheezing.  GI: See history of present illness. GU:  Negative for dysuria, hematuria, urinary incontinence, urinary frequency, nocturnal urination.  Endo: Negative for unusual weight change.     Physical Exam   BP (!) 135/90 (BP Location: Left Arm, Patient Position: Sitting, Cuff Size: Large)   Pulse 74   Temp (!) 97.1 F (36.2 C) (Temporal)   Ht '5\' 9"'$  (1.753 m)   Wt 239 lb 12.8 oz (108.8 kg)   SpO2 97%   BMI 35.41 kg/m    General: Well-nourished, well-developed in no acute distress.  Eyes: No icterus. Mouth: Oropharyngeal mucosa moist and pink , no lesions erythema or exudate. Lungs: Clear to auscultation bilaterally.  Heart: Regular rate and rhythm, no murmurs rubs or gallops.  Abdomen: Bowel sounds are normal, nontender, nondistended, no hepatosplenomegaly or masses,  no abdominal bruits or hernia , no rebound or guarding.  Rectal: not performed Extremities: No lower extremity edema. No clubbing or deformities. Neuro: Alert and oriented x 4   Skin: Warm and dry, no jaundice.   Psych: Alert and cooperative, normal mood and affect.  Labs   Lab Results  Component Value Date   CREATININE 0.66 11/17/2021   BUN 13 11/17/2021   NA 139 11/17/2021   K 4.2 11/17/2021   CL 101 11/17/2021   CO2 23 11/17/2021   Lab Results  Component Value Date   WBC 6.1 11/16/2021   HGB 13.7 11/16/2021   HCT 40.3 11/16/2021   MCV 92.4 11/16/2021   PLT 118 (L) 11/16/2021   Lab Results  Component Value Date   ALT 20 09/14/2021   AST 17 09/14/2021   ALKPHOS 119 09/14/2021   BILITOT <0.2 09/14/2021   Lab Results  Component Value Date   IRON 58 10/25/2021   TIBC 355 10/25/2021   FERRITIN 43 10/25/2021   Lab Results  Component Value Date   MVEHMCNO70 962 06/23/2020   Lab  Results  Component Value Date   FOLATE 20.1 03/23/2020    Imaging Studies   No results found.  Assessment   GERD/large hiatal hernia: reflux symptoms well controlled now on esomeprazole once daily. He has history of large hiatal hernia and at time of EGD in 2021 he had mild erythema at the level of the hiatus. Overall he is asymptomatic from standpoint of his hernia. We discussed signs/symptoms to watch for. At this time would not recommend surgical intervention unless he has documented significant blood loss due to hiatel hernia such as cameron lesions or if he develops pain or refractory reflux issues.   IDA: followed by hematology. Work up in 2021 with EGD/colonoscopy and capsule endoscopy (limited views of distal small bowel). His Hgb has been maintained but he has required intermittent iron infusions to maintain his ferritin. He went from 08/2020 to 05/2021 between infusions and since then he has received five infusions this year, 3 of which occurred after starting anticoagulation. He is at risk of occult GI bleeding in setting of large hiatal hernia, NSAID use, and anticoagulation.   Intermittent thrombocytopenia: no evidence of splenomegaly or cirrhosis on 03/2020 CT. Will follow at next labs.    PLAN   Continue your medication (esomeprazole) daily for reflux and hiatal hernia. Limit use of ibuprofen and other NSAIDs due to history of anemia/bleeding. Follow labs along with hematology/PCP. If iron needs increase or drop in hemoglobin, we would consider updating upper endoscopy to  evaluate hiatal hernia. Call with any black or bloody stools.  Return office visit in 05/2022.    Laureen Ochs. Bobby Rumpf, [place], [facility]

## 2022-01-12 NOTE — Patient Instructions (Signed)
Continue your medication (esomeprazole) daily for reflux and hiatal hernia. You should limit use of ibuprofen and other NSAIDs due to history of anemia/bleeding. I will follow your labs along with your other providers. If you iron needs increase or you drop your hemoglobin, we would consider updating your upper endoscopy to evaluate. Call with any black or bloody stools.  Return office visit in 05/2022.

## 2022-02-03 ENCOUNTER — Ambulatory Visit (INDEPENDENT_AMBULATORY_CARE_PROVIDER_SITE_OTHER): Payer: PPO

## 2022-02-03 DIAGNOSIS — Z0000 Encounter for general adult medical examination without abnormal findings: Secondary | ICD-10-CM

## 2022-02-03 NOTE — Progress Notes (Signed)
Subjective:   KELIJAH TOWRY is a 56 y.o. male who presents for Medicare Annual/Subsequent preventive examination.  Review of Systems    I connected with  Marlou Sa on 02/03/22 by a audio enabled telemedicine application and verified that I am speaking with the correct person using two identifiers.  Patient Location: Home  Provider Location: Office/Clinic  I discussed the limitations of evaluation and management by telemedicine. The patient expressed understanding and agreed to proceed.        Objective:    There were no vitals filed for this visit. There is no height or weight on file to calculate BMI.     11/22/2021    4:14 PM 11/14/2021    9:35 AM 11/09/2021    4:02 PM 10/28/2021    3:39 PM 10/19/2021   11:21 AM 09/19/2021    9:44 AM 08/25/2021    5:15 AM  Advanced Directives  Does Patient Have a Medical Advance Directive? No No;Yes Yes No No No Yes  Type of Social research officer, government;Living will;Out of facility DNR (pink MOST or yellow form)       Does patient want to make changes to medical advance directive?       No - Patient declined  Copy of [place] in Chart?  No - copy requested       Would patient like information on creating a medical advance directive? No - Patient declined No - Patient declined  No - Patient declined No - Patient declined      Current Medications (verified) Outpatient Encounter Medications as of 02/03/2022  Medication Sig   acetaminophen (TYLENOL) 650 MG CR tablet Take 1,300 mg by mouth every 8 (eight) hours as needed for pain.   albuterol (VENTOLIN HFA) 108 (90 Base) MCG/ACT inhaler INHALE 2 PUFFS INTO THE LUNGS EVERY 6 HOURS AS NEEDED FOR WHEEZING OR SHORTNESS OF BREATH   apixaban (ELIQUIS) 5 MG TABS tablet Take 1 tablet (5 mg total) by mouth 2 (two) times daily.   Ascorbic Acid (VITAMIN C PO) Take 500 mg by mouth daily.   esomeprazole (NEXIUM) 40 MG capsule Take 1 capsule (40 mg total) by mouth  daily.   ferrous sulfate 325 (65 FE) MG EC tablet TAKE 1 TABLET(325 MG) BY MOUTH TWICE DAILY   flecainide (TAMBOCOR) 100 MG tablet Take 1 tablet (100 mg total) by mouth every 12 (twelve) hours.   furosemide (LASIX) 20 MG tablet Take 1 tablet (20 mg total) by mouth daily.   Lactobacillus Rhamnosus, GG, (PROBIOTIC COLIC) LIQD Take 1 tablet by mouth 4 (four) times a week.   metFORMIN (GLUCOPHAGE) 1000 MG tablet Take 1 tablet (1,000 mg total) by mouth 2 (two) times daily with a meal.   metoprolol succinate (TOPROL-XL) 100 MG 24 hr tablet Take 1 tablet (100 mg total) by mouth daily.   Multiple Vitamin (MULTIVITAMIN) tablet Take 1 tablet by mouth daily.   oxyCODONE-acetaminophen (PERCOCET/ROXICET) 5-325 MG tablet Take 1 tablet by mouth every 8 (eight) hours as needed for severe pain.   potassium chloride SA (KLOR-CON M) 10 MEQ tablet Take 1 tablet (10 mEq total) by mouth daily.   pravastatin (PRAVACHOL) 20 MG tablet TAKE 1 TABLET BY MOUTH 3 TIMES EVERY WEEK   vitamin B-12 (CYANOCOBALAMIN) 1000 MCG tablet Take 1,000 mcg by mouth daily.   Facility-Administered Encounter Medications as of 02/03/2022  Medication   iron sucrose (VENOFER) 200 mg in sodium chloride 0.9 % 100 mL IVPB  Allergies (verified) Gabapentin, Lyrica [pregabalin], Crestor [rosuvastatin], and Farxiga [dapagliflozin]   History: Past Medical History:  Diagnosis Date   A-fib (HCC)    Arthritis    Diabetes ([place])    GERD (gastroesophageal reflux disease)    Heart murmur    IDA (iron deficiency anemia)    Pneumonia 06/27/2006   Past Surgical History:  Procedure Laterality Date   ANTERIOR FUSION CERVICAL SPINE     CARDIOVERSION N/A 10/05/2021   Procedure: CARDIOVERSION;  Surgeon: Sanda Klein, MD;  Location: [facility];  Service: Cardiovascular;  Laterality: N/A;   CARDIOVERSION N/A 11/18/2021   Procedure: CARDIOVERSION;  Surgeon: Berniece Salines, DO;  Location: [facility];  Service: Cardiovascular;  Laterality: N/A;    COLONOSCOPY WITH PROPOFOL N/A 03/26/2020   Procedure: COLONOSCOPY WITH PROPOFOL;  Surgeon: Daneil Dolin, MD;  [facility];  Service: Endoscopy;  Laterality: N/A;   disc in neck Left 05/2017   ESOPHAGEAL DILATION N/A 03/25/2020   Procedure: ESOPHAGEAL DILATION;  Surgeon: Rogene Houston, MD;  [facility];  Service: Endoscopy;  Laterality: N/A;   ESOPHAGOGASTRODUODENOSCOPY (EGD) WITH PROPOFOL N/A 03/25/2020   Procedure: ESOPHAGOGASTRODUODENOSCOPY (EGD) WITH PROPOFOL;  Surgeon: Rogene Houston, MD;  [facility];  Service: Endoscopy;  Laterality: N/A;   GIVENS CAPSULE STUDY N/A 04/13/2020   Procedure: GIVENS CAPSULE STUDY;  Surgeon: Daneil Dolin, MD;  [facility];  Service: Endoscopy;  Laterality: N/A;  7:30am   INSERTION OF MESH N/A 01/23/2013   Procedure: INSERTION OF MESH;  Surgeon: Adin Hector, MD;  Location: [facility];  Service: General;  Laterality: N/A;   LEFT HEART CATH AND CORONARY ANGIOGRAPHY N/A 08/27/2021   Procedure: LEFT HEART CATH AND CORONARY ANGIOGRAPHY;  Surgeon: Martinique, Peter M, MD;  Location: [facility];  Service: Cardiovascular;  Laterality: N/A;   POLYPECTOMY  03/26/2020   Procedure: POLYPECTOMY;  Surgeon: Daneil Dolin, MD;  [facility];  Service: Endoscopy;;   TONSILLECTOMY     as child   VENTRAL HERNIA REPAIR N/A 01/23/2013   Procedure: LAPAROSCOPIC MULTIPLE VENTRAL HERNIAS;  Surgeon: Adin Hector, MD;  Location: [facility];  Service: General;  Laterality: N/A;   Family History  Problem Relation Age of Onset   Depression Mother    Hearing loss Mother    Hyperlipidemia Mother    Hypertension Mother    Stroke Mother    Early death Father    Diabetes Brother    Pancreatic cancer Paternal Uncle    Colon cancer Neg Hx    Social History   Socioeconomic History   Marital status: Married    Spouse name: Not on file   Number of children: Not on file   Years of education: Not on file    Highest education level: Not on file  Occupational History   Not on file  Tobacco Use   Smoking status: Every Day    Packs/day: 0.50    Years: 42.00    Total pack years: 21.00    Types: Cigarettes    Start date: 56   Smokeless tobacco: Never  Vaping Use   Vaping Use: Never used  Substance and Sexual Activity   Alcohol use: Yes    Alcohol/week: 4.0 standard drinks of alcohol    Types: 4 Cans of beer per week    Comment: moderately   Drug use: Not Currently    Types: Cocaine    Comment: states he has tried it all.  Last used cocaine a few months ago.    Sexual activity: Yes  Other Topics Concern   Not on file  Social History Narrative   Not on file   Social Determinants of Health   Financial Resource Strain: Low Risk  (04/20/2020)   Overall Financial Resource Strain (CARDIA)    Difficulty of Paying Living Expenses: Not hard at all  Food Insecurity: No Food Insecurity (04/20/2020)   Hunger Vital Sign    Worried About Running Out of Food in the Last Year: Never true    Ran Out of Food in the Last Year: Never true  Transportation Needs: No Transportation Needs (04/20/2020)   PRAPARE - Hydrologist (Medical): No    Lack of Transportation (Non-Medical): No  Physical Activity: Inactive (04/20/2020)   Exercise Vital Sign    Days of Exercise per Week: 0 days    Minutes of Exercise per Session: 0 min  Stress: Stress Concern Present (04/20/2020)   [place]    Feeling of Stress : Very much  Social Connections: Moderately Isolated (04/20/2020)   Social Connection and Isolation Panel [NHANES]    Frequency of Communication with Friends and Family: More than three times a week    Frequency of Social Gatherings with Friends and Family: More than three times a week    Attends Religious Services: Never    Marine scientist or Organizations: No    Attends Archivist Meetings:  Never    Marital Status: Married    Tobacco Counseling Ready to quit: Not Answered Counseling given: Not Answered   Clinical Intake:  Mr. Oelkers , Thank you for taking time to come for your Medicare Wellness Visit. I appreciate your ongoing commitment to your health goals. Please review the following plan we discussed and let me know if I can assist you in the future.   These are the goals we discussed:  Goals      Patient Stated     Lose weight.        This is a list of the screening recommended for you and due dates:  Health Maintenance  Topic Date Due   Zoster (Shingles) Vaccine (1 of 2) Never done   COVID-19 Vaccine (3 - Moderna risk series) 12/01/2019   Complete foot exam   07/20/2021   Flu Shot  12/14/2021   Tetanus Vaccine  03/23/2022   Hemoglobin A1C  04/08/2022   Yearly kidney health urinalysis for diabetes  09/02/2022   Eye exam for diabetics  09/08/2022   Yearly kidney function blood test for diabetes  11/18/2022   Colon Cancer Screening  03/26/2030   Hepatitis C Screening: USPSTF Recommendation to screen - Ages 18-79 yo.  Completed   HIV Screening  Completed   HPV Vaccine  Aged Out       Diabetic?yes Nutrition Risk Assessment:  Has the patient had any N/V/D within the last 2 months?  No  Does the patient have any non-healing wounds?  No  Has the patient had any unintentional weight loss or weight gain?  No   Diabetes:  Is the patient diabetic?  Yes  If diabetic, was a CBG obtained today?  No  Did the patient bring in their glucometer from home?  No  How often do you monitor your CBG's? Every other day .   Financial Strains and Diabetes Management:  Are you having any financial strains with the device,  your supplies or your medication? No .  Does the patient want to be seen by Chronic Care Management for management of their diabetes?  No  Would the patient like to be referred to a Nutritionist or for Diabetic Management?  No   Diabetic  Exams:  Diabetic Eye Exam: Completed 09/07/21 Diabetic Foot Exam: Overdue, Pt has been advised about the importance in completing this exam. Pt is scheduled for diabetic foot exam on  .          Activities of Daily Living    11/14/2021    4:00 PM 08/24/2021    3:58 PM  In your present state of health, do you have any difficulty performing the following activities:  Hearing? 0 0  Vision? 0 0  Difficulty concentrating or making decisions? 0 0  Walking or climbing stairs? 0 1  Dressing or bathing? 0 0  Doing errands, shopping? 0 0    Patient Care Team: Lindell Spar, MD as PCP - General (Internal Medicine) Werner Lean, MD as PCP - Cardiology (Cardiology) Sueanne Margarita, MD as PCP - Sleep Medicine (Cardiology) Brien Mates, RN as Oncology Nurse Navigator (Oncology)  Indicate any recent Medical Services you may have received from other than [facility] providers in the past year (date may be approximate).     Assessment:   This is a routine wellness examination for [place].  Hearing/Vision screen No results found.  Dietary issues and exercise activities discussed:     Goals Addressed   None   Depression Screen    12/02/2021    1:26 PM 09/01/2021    2:04 PM 06/30/2021    8:30 AM 07/17/2020   12:10 PM 04/20/2020    2:10 PM 12/11/2017   12:07 PM 09/04/2017    8:29 AM  PHQ 2/9 Scores  PHQ - 2 Score 0 0 0 '2 1 4 '$ 0  PHQ- 9 Score    12  13     Fall Risk    12/02/2021    1:26 PM 09/01/2021    2:03 PM 06/30/2021    8:30 AM 07/17/2020   12:10 PM 05/04/2020    8:42 AM  Fall Risk   Falls in the past year? 0 0 0 0 0  Number falls in past yr: 0 0 0  0  Injury with Fall? 0 0 0  0  Risk for fall due to : No Fall Risks No Fall Risks No Fall Risks No Fall Risks   Follow up Falls evaluation completed Falls evaluation completed Falls evaluation completed Falls evaluation completed     [place]:  Any stairs in or around the home? No   If so, are there any without handrails?  N/a Home free of loose throw rugs in walkways, pet beds, electrical cords, etc? Yes  Adequate lighting in your home to reduce risk of falls? Yes   ASSISTIVE DEVICES UTILIZED TO PREVENT FALLS:  Life alert? No  Use of a cane, walker or w/c? No  Grab bars in the bathroom? No  Shower chair or bench in shower? No  Elevated toilet seat or a handicapped toilet? No           Immunizations Immunization History  Administered Date(s) Administered   Influenza,inj,Quad PF,6+ Mos 02/13/2019, 04/01/2020   Influenza,inj,quad, With Preservative 02/27/2018   Influenza-Unspecified 02/27/2018   Moderna Sars-Covid-2 Vaccination 10/06/2019, 11/03/2019   Pneumococcal Polysaccharide-23 07/30/2014   Tdap 03/23/2012    TDAP status:  Up to date  Flu Vaccine status: Due, Education has been provided regarding the importance of this vaccine. Advised may receive this vaccine at local pharmacy or Health Dept. Aware to provide a copy of the vaccination record if obtained from local pharmacy or Health Dept. Verbalized acceptance and understanding.    Covid-19 vaccine status: Completed vaccines  Qualifies for Shingles Vaccine? Yes   Zostavax completed No   Shingrix Completed?: No.    Education has been provided regarding the importance of this vaccine. Patient has been advised to call insurance company to determine out of pocket expense if they have not yet received this vaccine. Advised may also receive vaccine at local pharmacy or Health Dept. Verbalized acceptance and understanding.  Screening Tests Health Maintenance  Topic Date Due   Zoster Vaccines- Shingrix (1 of 2) Never done   COVID-19 Vaccine (3 - Moderna risk series) 12/01/2019   FOOT EXAM  07/20/2021   INFLUENZA VACCINE  12/14/2021   TETANUS/TDAP  03/23/2022   HEMOGLOBIN A1C  04/08/2022   Diabetic kidney evaluation - Urine ACR  09/02/2022   OPHTHALMOLOGY EXAM  09/08/2022   Diabetic kidney  evaluation - GFR measurement  11/18/2022   COLONOSCOPY (Pts 45-7yr Insurance coverage will need to be confirmed)  03/26/2030   Hepatitis C Screening  Completed   HIV Screening  Completed   HPV VACCINES  Aged Out    Health Maintenance  Health Maintenance Due  Topic Date Due   Zoster Vaccines- Shingrix (1 of 2) Never done   COVID-19 Vaccine (3 - Moderna risk series) 12/01/2019   FOOT EXAM  07/20/2021   INFLUENZA VACCINE  12/14/2021    Colorectal cancer screening: Type of screening: Colonoscopy. Completed 03/26/20. Repeat every 10 years  Lung Cancer Screening: (Low Dose CT Chest recommended if Age 58-80years, 30 pack-year currently smoking OR have quit w/in 15years.) does not qualify.   Lung Cancer Screening Referral: no  Additional Screening:  Hepatitis C Screening: does qualify; Completed 07/22/20  Vision Screening: Recommended annual ophthalmology exams for early detection of glaucoma and other disorders of the eye. Is the patient up to date with their annual eye exam?  Yes  Who is the provider or what is the name of the office in which the patient attends annual eye exams? 09/07/21 If pt is not established with a provider, would they like to be referred to a provider to establish care? No .   Dental Screening: Recommended annual dental exams for proper oral hygiene  Community Resource Referral / Chronic Care Management: CRR required this visit?  No   CCM required this visit?  No      Plan:     I have personally reviewed and noted the following in the patient's chart:   Medical and social history Use of alcohol, tobacco or illicit drugs  Current medications and supplements including opioid prescriptions. Patient is currently taking opioid prescriptions. Information provided to patient regarding non-opioid alternatives. Patient advised to discuss non-opioid treatment plan with their provider. Functional ability and status Nutritional status Physical  activity Advanced directives List of other physicians Hospitalizations, surgeries, and ER visits in previous 12 months Vitals Screenings to include cognitive, depression, and falls Referrals and appointments  In addition, I have reviewed and discussed with patient certain preventive protocols, quality metrics, and best practice recommendations. A written personalized care plan for preventive services as well as general preventive health recommendations were provided to patient.     KQuentin Angst [place]  02/03/2022

## 2022-02-03 NOTE — Patient Instructions (Signed)
  Mr. Hoefle , Thank you for taking time to come for your Medicare Wellness Visit. I appreciate your ongoing commitment to your health goals. Please review the following plan we discussed and let me know if I can assist you in the future.   These are the goals we discussed:  Goals      Patient Stated     Lose weight.        This is a list of the screening recommended for you and due dates:  Health Maintenance  Topic Date Due   Zoster (Shingles) Vaccine (1 of 2) Never done   COVID-19 Vaccine (3 - Moderna risk series) 12/01/2019   Complete foot exam   07/20/2021   Flu Shot  12/14/2021   Tetanus Vaccine  03/23/2022   Hemoglobin A1C  04/08/2022   Yearly kidney health urinalysis for diabetes  09/02/2022   Eye exam for diabetics  09/08/2022   Yearly kidney function blood test for diabetes  11/18/2022   Colon Cancer Screening  03/26/2030   Hepatitis C Screening: USPSTF Recommendation to screen - Ages 18-79 yo.  Completed   HIV Screening  Completed   HPV Vaccine  Aged Out

## 2022-02-16 ENCOUNTER — Other Ambulatory Visit: Payer: Self-pay | Admitting: Physician Assistant

## 2022-03-07 ENCOUNTER — Encounter: Payer: Self-pay | Admitting: Internal Medicine

## 2022-03-07 ENCOUNTER — Ambulatory Visit (INDEPENDENT_AMBULATORY_CARE_PROVIDER_SITE_OTHER): Payer: PPO | Admitting: Internal Medicine

## 2022-03-07 VITALS — BP 128/84 | HR 78 | Resp 18 | Ht 69.0 in | Wt 239.6 lb

## 2022-03-07 DIAGNOSIS — I4819 Other persistent atrial fibrillation: Secondary | ICD-10-CM

## 2022-03-07 DIAGNOSIS — Z0001 Encounter for general adult medical examination with abnormal findings: Secondary | ICD-10-CM | POA: Insufficient documentation

## 2022-03-07 DIAGNOSIS — Z981 Arthrodesis status: Secondary | ICD-10-CM | POA: Diagnosis not present

## 2022-03-07 DIAGNOSIS — J309 Allergic rhinitis, unspecified: Secondary | ICD-10-CM

## 2022-03-07 DIAGNOSIS — E782 Mixed hyperlipidemia: Secondary | ICD-10-CM

## 2022-03-07 DIAGNOSIS — E1169 Type 2 diabetes mellitus with other specified complication: Secondary | ICD-10-CM | POA: Diagnosis not present

## 2022-03-07 DIAGNOSIS — Z125 Encounter for screening for malignant neoplasm of prostate: Secondary | ICD-10-CM

## 2022-03-07 DIAGNOSIS — E559 Vitamin D deficiency, unspecified: Secondary | ICD-10-CM | POA: Diagnosis not present

## 2022-03-07 DIAGNOSIS — J449 Chronic obstructive pulmonary disease, unspecified: Secondary | ICD-10-CM | POA: Diagnosis not present

## 2022-03-07 DIAGNOSIS — Z23 Encounter for immunization: Secondary | ICD-10-CM | POA: Diagnosis not present

## 2022-03-07 DIAGNOSIS — I10 Essential (primary) hypertension: Secondary | ICD-10-CM

## 2022-03-07 MED ORDER — ALBUTEROL SULFATE HFA 108 (90 BASE) MCG/ACT IN AERS: 2.0000 | INHALATION_SPRAY | Freq: Four times a day (QID) | RESPIRATORY_TRACT | 2 refills | Status: DC | PRN

## 2022-03-07 MED ORDER — FLUTICASONE PROPIONATE 50 MCG/ACT NA SUSP: 2.0000 | Freq: Every day | NASAL | 6 refills | Status: DC

## 2022-03-07 NOTE — Assessment & Plan Note (Addendum)
BP Readings from Last 1 Encounters:  03/07/22 128/84   Well-controlled with metoprolol now Counseled for compliance with the medications Advised DASH diet and moderate exercise/walking, at least 150 mins/week

## 2022-03-07 NOTE — Progress Notes (Signed)
Established Patient Office Visit  Subjective:  Patient ID: Stephen Roberts, male    DOB: 04-28-1966  Age: 56 y.o. MRN: [id number]  CC:  Chief Complaint  Patient presents with   Annual Exam    Annual exam pt having nagging headache for 2 weeks also back pain from surgery when it gets cold the fluttering in his chest is better still there but better     HPI Stephen Roberts is a 56 y.o. male with past medical history of atrial fibrillation,left parotid mass, IDA, type II DM, HLD, chronic neck pain and tobacco abuse who presents for annual physical.  Atrial fibrillation: He is in sinus rhythm now with flecainide.  He takes metoprolol for rate control and Eliquis for [facility].  Followed by cardiology.  He reports intermittent palpitations, for which he takes additional dose of metoprolol, which improves his symptoms.  COPD: He has mild, intermittent dyspnea, for which he uses albuterol inhaler.  Denies any wheezing currently.  He has mild headache, likely due to allergic rhinitis/sinusitis.  Denies any fever or chills currently.  He is still smokes about 0.3 pack/day and is trying to quit.  Type II DM: He takes metformin 1000 mg twice daily.  His last HbA1c was 8.2.  Does not check blood glucose regularly.  Denies any polyuria or polyphagia currently.  He reports chronic neck pain, has history of cervical spinal fusion surgery for cervical  radiculopathy.  He takes Percocet as needed for severe neck pain.  Past Medical History:  Diagnosis Date   A-fib (HCC)    Arthritis    Diabetes (HCC)    GERD (gastroesophageal reflux disease)    Heart murmur    IDA (iron deficiency anemia)    Pneumonia 06/27/2006    Past Surgical History:  Procedure Laterality Date   ANTERIOR FUSION CERVICAL SPINE     CARDIOVERSION N/A 10/05/2021   Procedure: CARDIOVERSION;  Surgeon: Sanda Klein, MD;  Location: [place];  Service: Cardiovascular;  Laterality: N/A;   CARDIOVERSION N/A 11/18/2021   Procedure:  CARDIOVERSION;  Surgeon: Berniece Salines, DO;  Location: [place];  Service: Cardiovascular;  Laterality: N/A;   COLONOSCOPY WITH PROPOFOL N/A 03/26/2020   Procedure: COLONOSCOPY WITH PROPOFOL;  Surgeon: Daneil Dolin, MD;  [facility];  Service: Endoscopy;  Laterality: N/A;   disc in neck Left 05/2017   ESOPHAGEAL DILATION N/A 03/25/2020   Procedure: ESOPHAGEAL DILATION;  Surgeon: Rogene Houston, MD;  [facility];  Service: Endoscopy;  Laterality: N/A;   ESOPHAGOGASTRODUODENOSCOPY (EGD) WITH PROPOFOL N/A 03/25/2020   Procedure: ESOPHAGOGASTRODUODENOSCOPY (EGD) WITH PROPOFOL;  Surgeon: Rogene Houston, MD;  [facility];  Service: Endoscopy;  Laterality: N/A;   GIVENS CAPSULE STUDY N/A 04/13/2020   Procedure: GIVENS CAPSULE STUDY;  Surgeon: Daneil Dolin, MD;  [facility];  Service: Endoscopy;  Laterality: N/A;  7:30am   INSERTION OF MESH N/A 01/23/2013   Procedure: INSERTION OF MESH;  Surgeon: Adin Hector, MD;  Location: [facility];  Service: General;  Laterality: N/A;   LEFT HEART CATH AND CORONARY ANGIOGRAPHY N/A 08/27/2021   Procedure: LEFT HEART CATH AND CORONARY ANGIOGRAPHY;  Surgeon: Martinique, Peter M, MD;  Location: [place] [facility];  Service: Cardiovascular;  Laterality: N/A;   POLYPECTOMY  03/26/2020   Procedure: POLYPECTOMY;  Surgeon: Daneil Dolin, MD;  [facility];  Service: Endoscopy;;   TONSILLECTOMY     as child   [place]  N/A 01/23/2013   Procedure: LAPAROSCOPIC MULTIPLE VENTRAL HERNIAS;  Surgeon: Adin Hector, MD;  Location: [facility];  Service: General;  Laterality: N/A;    Family History  Problem Relation Age of Onset   Depression Mother    Hearing loss Mother    Hyperlipidemia Mother    Hypertension Mother    Stroke Mother    Early death Father    Diabetes Brother    Pancreatic cancer Paternal Uncle    Colon cancer Neg Hx     Social History   Socioeconomic History   Marital  status: Married    Spouse name: Not on file   Number of children: Not on file   Years of education: Not on file   Highest education level: Not on file  Occupational History   Not on file  Tobacco Use   Smoking status: Every Day    Packs/day: 0.50    Years: 42.00    Total pack years: 21.00    Types: Cigarettes    Start date: 49   Smokeless tobacco: Never  Vaping Use   Vaping Use: Never used  Substance and Sexual Activity   Alcohol use: Yes    Alcohol/week: 4.0 standard drinks of alcohol    Types: 4 Cans of beer per week    Comment: moderately   Drug use: Not Currently    Types: Cocaine    Comment: states he has tried it all. Last used cocaine a few months ago.    Sexual activity: Yes  Other Topics Concern   Not on file  Social History Narrative   Not on file   Social Determinants of Health   Financial Resource Strain: Low Risk  (02/03/2022)   Overall Financial Resource Strain (CARDIA)    Difficulty of Paying Living Expenses: Not hard at all  Food Insecurity: No Food Insecurity (02/03/2022)   Hunger Vital Sign    Worried About Running Out of Food in the Last Year: Never true    Ran Out of Food in the Last Year: Never true  Transportation Needs: No Transportation Needs (02/03/2022)   PRAPARE - Hydrologist (Medical): No    Lack of Transportation (Non-Medical): No  Physical Activity: Insufficiently Active (02/03/2022)   Exercise Vital Sign    Days of Exercise per Week: 5 days    Minutes of Exercise per Session: 20 min  Stress: No Stress Concern Present (02/03/2022)   [place]    Feeling of Stress : Only a little  Social Connections: Socially Isolated (02/03/2022)   Social Connection and Isolation Panel [NHANES]    Frequency of Communication with Friends and Family: Never    Frequency of Social Gatherings with Friends and Family: Once a week    Attends Religious Services:  Never    Marine scientist or Organizations: No    Attends Archivist Meetings: Never    Marital Status: Married  Human resources officer Violence: Not At Risk (02/03/2022)   Humiliation, Afraid, Rape, and Kick questionnaire    Fear of Current or Ex-Partner: No    Emotionally Abused: No    Physically Abused: No    Sexually Abused: No    Outpatient Medications Prior to Visit  Medication Sig Dispense Refill   acetaminophen (TYLENOL) 650 MG CR tablet Take 1,300 mg by mouth every 8 (eight) hours as needed for pain.     apixaban (ELIQUIS) 5 MG TABS tablet Take  1 tablet (5 mg total) by mouth 2 (two) times daily. 60 tablet 5   Ascorbic Acid (VITAMIN C PO) Take 500 mg by mouth daily.     esomeprazole (NEXIUM) 40 MG capsule Take 1 capsule (40 mg total) by mouth daily. 90 capsule 3   ferrous sulfate 325 (65 FE) MG EC tablet TAKE 1 TABLET(325 MG) BY MOUTH TWICE DAILY 60 tablet 3   flecainide (TAMBOCOR) 100 MG tablet TAKE 1 TABLET(100 MG) BY MOUTH EVERY 12 HOURS 60 tablet 11   furosemide (LASIX) 20 MG tablet Take 1 tablet (20 mg total) by mouth daily. 90 tablet 3   Lactobacillus Rhamnosus, GG, (PROBIOTIC COLIC) LIQD Take 1 tablet by mouth 4 (four) times a week.     metFORMIN (GLUCOPHAGE) 1000 MG tablet Take 1 tablet (1,000 mg total) by mouth 2 (two) times daily with a meal. 60 tablet 5   metoprolol succinate (TOPROL-XL) 100 MG 24 hr tablet Take 1 tablet (100 mg total) by mouth daily. 30 tablet 6   Multiple Vitamin (MULTIVITAMIN) tablet Take 1 tablet by mouth daily.     oxyCODONE-acetaminophen (PERCOCET/ROXICET) 5-325 MG tablet Take 1 tablet by mouth every 8 (eight) hours as needed for severe pain.     potassium chloride SA (KLOR-CON M) 10 MEQ tablet Take 1 tablet (10 mEq total) by mouth daily. 90 tablet 3   pravastatin (PRAVACHOL) 20 MG tablet TAKE 1 TABLET BY MOUTH 3 TIMES EVERY WEEK 15 tablet 10   vitamin B-12 (CYANOCOBALAMIN) 1000 MCG tablet Take 1,000 mcg by mouth daily.     albuterol  (VENTOLIN HFA) 108 (90 Base) MCG/ACT inhaler INHALE 2 PUFFS INTO THE LUNGS EVERY 6 HOURS AS NEEDED FOR WHEEZING OR SHORTNESS OF BREATH 18 g 0   Facility-Administered Medications Prior to Visit  Medication Dose Route Frequency Provider Last Rate Last Admin   iron sucrose (VENOFER) 200 mg in sodium chloride 0.9 % 100 mL IVPB  200 mg Intravenous Once Pennington, Rebekah M, PA-C        Allergies  Allergen Reactions   Gabapentin Shortness Of Breath   Lyrica [Pregabalin] Shortness Of Breath   Crestor [Rosuvastatin] Other (See Comments)    myalgia   Farxiga [Dapagliflozin]     Developed UTI    ROS Review of Systems  Constitutional:  Negative for chills and fever.  HENT:  Negative for congestion, sinus pressure, sinus pain and sore throat.   Eyes:  Negative for pain and discharge.  Respiratory:  Positive for shortness of breath. Negative for cough.   Cardiovascular:  Positive for palpitations. Negative for chest pain.  Gastrointestinal:  Negative for diarrhea, nausea and vomiting.  Endocrine: Negative for polydipsia and polyuria.  Genitourinary:  Negative for dysuria and hematuria.  Musculoskeletal:  Positive for back pain and neck pain. Negative for neck stiffness.  Skin:  Negative for rash.  Neurological:  Positive for headaches. Negative for dizziness, weakness and numbness.  Psychiatric/Behavioral:  Negative for agitation and behavioral problems.       Objective:    Physical Exam Vitals reviewed.  Constitutional:      General: He is not in acute distress.    Appearance: He is not diaphoretic.  HENT:     Head: Normocephalic and atraumatic.     Comments: Enlarged left parotid gland    Nose: No congestion.     Mouth/Throat:     Mouth: Mucous membranes are moist.  Eyes:     General: No scleral icterus.    Extraocular Movements: Extraocular  movements intact.  Neck:     Vascular: No carotid bruit.  Cardiovascular:     Rate and Rhythm: Normal rate and regular rhythm.      Pulses: Normal pulses.     Heart sounds: Normal heart sounds. No murmur heard. Pulmonary:     Breath sounds: Normal breath sounds. No wheezing or rales.  Abdominal:     Palpations: Abdomen is soft.     Tenderness: There is no abdominal tenderness.  Musculoskeletal:     Cervical back: Neck supple. No tenderness.     Right lower leg: No edema.     Left lower leg: No edema.  Skin:    General: Skin is warm.     Findings: No rash.  Neurological:     General: No focal deficit present.     Mental Status: He is alert and oriented to person, place, and time.     Cranial Nerves: No cranial nerve deficit.     Sensory: No sensory deficit.     Motor: No weakness.  Psychiatric:        Mood and Affect: Mood normal.        Behavior: Behavior normal.     BP 128/84 (BP Location: Right Arm, Patient Position: Sitting, Cuff Size: Normal)   Pulse 78   Resp 18   Ht '5\' 9"'  (1.753 m)   Wt 239 lb 9.6 oz (108.7 kg)   SpO2 94%   BMI 35.38 kg/m  Wt Readings from Last 3 Encounters:  03/07/22 239 lb 9.6 oz (108.7 kg)  01/12/22 239 lb 12.8 oz (108.8 kg)  12/30/21 237 lb (107.5 kg)    Lab Results  Component Value Date   TSH 1.900 08/24/2021   Lab Results  Component Value Date   WBC 6.1 11/16/2021   HGB 13.7 11/16/2021   HCT 40.3 11/16/2021   MCV 92.4 11/16/2021   PLT 118 (L) 11/16/2021   Lab Results  Component Value Date   NA 139 11/17/2021   K 4.2 11/17/2021   CO2 23 11/17/2021   GLUCOSE 188 (H) 11/17/2021   BUN 13 11/17/2021   CREATININE 0.66 11/17/2021   BILITOT <0.2 09/14/2021   ALKPHOS 119 09/14/2021   AST 17 09/14/2021   ALT 20 09/14/2021   PROT 7.2 09/14/2021   ALBUMIN 4.5 09/14/2021   CALCIUM 9.2 11/17/2021   ANIONGAP 15 11/17/2021   EGFR 81 11/13/2021   Lab Results  Component Value Date   CHOL 159 02/13/2019   Lab Results  Component Value Date   HDL 38 (L) 02/13/2019   Lab Results  Component Value Date   LDLCALC 97 02/13/2019   Lab Results  Component Value  Date   TRIG 140 02/13/2019   Lab Results  Component Value Date   CHOLHDL 4.2 02/13/2019   Lab Results  Component Value Date   HGBA1C 8.5 (H) 10/06/2021      Assessment & Plan:   Problem List Items Addressed This Visit       Cardiovascular and Mediastinum   Essential hypertension    BP Readings from Last 1 Encounters:  03/07/22 128/84  Well-controlled with metoprolol now Counseled for compliance with the medications Advised DASH diet and moderate exercise/walking, at least 150 mins/week      Persistent atrial fibrillation (HCC)    Rate controlled with metoprolol now On flecainide 100 mg daily In sinus rhythm now On Eliquis for [facility] Follow-up with cardiology S/p cardioversion      Relevant Orders   TSH  CBC with Differential/Platelet     Respiratory   Allergic rhinitis    His dull headache is likely due to allergic rhinitis/sinusitis Flonase for nasal congestion       Relevant Medications   fluticasone (FLONASE) 50 MCG/ACT nasal spray   Chronic obstructive pulmonary disease (HCC)    Uses albuterol as needed for dyspnea or wheezing, does not need it daily If frequent use needed, will add maintenance inhaler for COPD      Relevant Medications   albuterol (VENTOLIN HFA) 108 (90 Base) MCG/ACT inhaler   fluticasone (FLONASE) 50 MCG/ACT nasal spray     Endocrine   Type 2 diabetes mellitus with other specified complication (HCC)    Lab Results  Component Value Date   HGBA1C 8.5 (H) 10/06/2021  Uncontrolled Associated with HTN, HLD and obesity On Metformin, increased dose to 1000 mg twice daily in the last visit Advised to follow diabetic diet F/u CMP and lipid panel On statin and ARB now Diabetic eye exam: Advised to follow up with Ophthalmology for diabetic eye exam      Relevant Orders   Hemoglobin A1c   CMP14+EGFR     Other   Prostate cancer screening    Ordered PSA after discussing its limitations for prostate cancer screening, including false  positive results leading additional investigations.      Relevant Orders   PSA   Hyperlipidemia    On Crestor now      Relevant Orders   Lipid Profile   S/P cervical spinal fusion    Has chronic neck and back pain Used to take NSAIDs, likely related to PUD Takes Percocet as needed, not on daily basis - gets about 60 count in a year      Encounter for general adult medical examination with abnormal findings - Primary    Physical exam as documented. Fasting blood tests ordered. Flu vaccine today. Advised to get Shingrix vaccine at local pharmacy.      Other Visit Diagnoses     Vitamin D deficiency       Relevant Orders   VITAMIN D 25 Hydroxy (Vit-D Deficiency, Fractures)   Need for immunization against influenza       Relevant Orders   Flu Vaccine QUAD 47moIM (Fluarix, Fluzone & Alfiuria Quad PF) (Completed)       Meds ordered this encounter  Medications   albuterol (VENTOLIN HFA) 108 (90 Base) MCG/ACT inhaler    Sig: Inhale 2 puffs into the lungs every 6 (six) hours as needed for wheezing or shortness of breath.    Dispense:  18 g    Refill:  2   fluticasone (FLONASE) 50 MCG/ACT nasal spray    Sig: Place 2 sprays into both nostrils daily.    Dispense:  16 g    Refill:  6    Follow-up: Return in about 4 months (around 07/08/2022) for DM and HTN.    RLindell Spar MD

## 2022-03-07 NOTE — Assessment & Plan Note (Signed)
Has chronic neck and back pain Used to take NSAIDs, likely related to PUD Takes Percocet as needed, not on daily basis - gets about 60 count in a year

## 2022-03-07 NOTE — Assessment & Plan Note (Addendum)
Lab Results  Component Value Date   HGBA1C 8.5 (H) 10/06/2021   Uncontrolled Associated with HTN, HLD and obesity On Metformin, increased dose to 1000 mg twice daily in the last visit Advised to follow diabetic diet F/u CMP and lipid panel On statin and ARB now Diabetic eye exam: Advised to follow up with Ophthalmology for diabetic eye exam

## 2022-03-07 NOTE — Assessment & Plan Note (Signed)
His dull headache is likely due to allergic rhinitis/sinusitis Flonase for nasal congestion

## 2022-03-07 NOTE — Patient Instructions (Addendum)
Please use Flonase for nasal congestion.  Please continue to take other medications as prescribed.  Please continue to follow low carb diet and perform moderate exercise/walking at least 150 mins/week.  Please get fasting blood tests within a week.  Please consider getting Shingrix vaccine at local pharmacy.

## 2022-03-07 NOTE — Assessment & Plan Note (Signed)
Rate controlled with metoprolol now On flecainide 100 mg daily In sinus rhythm now On Eliquis for [facility] Follow-up with cardiology S/p cardioversion 

## 2022-03-07 NOTE — Assessment & Plan Note (Signed)
Physical exam as documented. Fasting blood tests ordered. Flu vaccine today. Advised to get Shingrix vaccine at local pharmacy.

## 2022-03-07 NOTE — Assessment & Plan Note (Signed)
On Crestor now 

## 2022-03-07 NOTE — Assessment & Plan Note (Signed)
Ordered PSA after discussing its limitations for prostate cancer screening, including false positive results leading additional investigations. 

## 2022-03-07 NOTE — Assessment & Plan Note (Signed)
Uses albuterol as needed for dyspnea or wheezing, does not need it daily If frequent use needed, will add maintenance inhaler for COPD

## 2022-03-08 DIAGNOSIS — E782 Mixed hyperlipidemia: Secondary | ICD-10-CM | POA: Diagnosis not present

## 2022-03-08 DIAGNOSIS — E559 Vitamin D deficiency, unspecified: Secondary | ICD-10-CM | POA: Diagnosis not present

## 2022-03-08 DIAGNOSIS — Z125 Encounter for screening for malignant neoplasm of prostate: Secondary | ICD-10-CM | POA: Diagnosis not present

## 2022-03-08 DIAGNOSIS — E1169 Type 2 diabetes mellitus with other specified complication: Secondary | ICD-10-CM | POA: Diagnosis not present

## 2022-03-08 DIAGNOSIS — I4819 Other persistent atrial fibrillation: Secondary | ICD-10-CM | POA: Diagnosis not present

## 2022-03-09 LAB — CBC WITH DIFFERENTIAL/PLATELET
Basophils Absolute: 0 10*3/uL (ref 0.0–0.2)
Basos: 1 %
EOS (ABSOLUTE): 0.2 10*3/uL (ref 0.0–0.4)
Eos: 4 %
Hematocrit: 45.2 % (ref 37.5–51.0)
Hemoglobin: 15.8 g/dL (ref 13.0–17.7)
Immature Grans (Abs): 0 10*3/uL (ref 0.0–0.1)
Immature Granulocytes: 0 %
Lymphocytes Absolute: 1.4 10*3/uL (ref 0.7–3.1)
Lymphs: 22 %
MCH: 35 pg — ABNORMAL HIGH (ref 26.6–33.0)
MCHC: 35 g/dL (ref 31.5–35.7)
MCV: 100 fL — ABNORMAL HIGH (ref 79–97)
Monocytes Absolute: 0.7 10*3/uL (ref 0.1–0.9)
Monocytes: 11 %
Neutrophils Absolute: 4 10*3/uL (ref 1.4–7.0)
Neutrophils: 62 %
Platelets: 132 10*3/uL — ABNORMAL LOW (ref 150–450)
RBC: 4.52 x10E6/uL (ref 4.14–5.80)
RDW: 12.6 % (ref 11.6–15.4)
WBC: 6.3 10*3/uL (ref 3.4–10.8)

## 2022-03-09 LAB — CMP14+EGFR
ALT: 31 IU/L (ref 0–44)
AST: 24 IU/L (ref 0–40)
Albumin/Globulin Ratio: 1.7 (ref 1.2–2.2)
Albumin: 4.7 g/dL (ref 3.8–4.9)
Alkaline Phosphatase: 111 IU/L (ref 44–121)
BUN/Creatinine Ratio: 22 — ABNORMAL HIGH (ref 9–20)
BUN: 20 mg/dL (ref 6–24)
Bilirubin Total: 0.4 mg/dL (ref 0.0–1.2)
CO2: 21 mmol/L (ref 20–29)
Calcium: 9.8 mg/dL (ref 8.7–10.2)
Chloride: 98 mmol/L (ref 96–106)
Creatinine, Ser: 0.93 mg/dL (ref 0.76–1.27)
Globulin, Total: 2.8 g/dL (ref 1.5–4.5)
Glucose: 145 mg/dL — ABNORMAL HIGH (ref 70–99)
Potassium: 4.4 mmol/L (ref 3.5–5.2)
Sodium: 138 mmol/L (ref 134–144)
Total Protein: 7.5 g/dL (ref 6.0–8.5)
eGFR: 95 mL/min/{1.73_m2} (ref >59)

## 2022-03-09 LAB — VITAMIN D 25 HYDROXY (VIT D DEFICIENCY, FRACTURES): Vit D, 25-Hydroxy: 41.3 ng/mL (ref 30.0–100.0)

## 2022-03-09 LAB — PSA: Prostate Specific Ag, Serum: 1.1 ng/mL (ref 0.0–4.0)

## 2022-03-09 LAB — LIPID PANEL
Chol/HDL Ratio: 4.9 ratio (ref 0.0–5.0)
Cholesterol, Total: 178 mg/dL (ref 100–199)
HDL: 36 mg/dL — ABNORMAL LOW (ref >39)
LDL Chol Calc (NIH): 116 mg/dL — ABNORMAL HIGH (ref 0–99)
Triglycerides: 147 mg/dL (ref 0–149)
VLDL Cholesterol Cal: 26 mg/dL (ref 5–40)

## 2022-03-09 LAB — HEMOGLOBIN A1C
Est. average glucose Bld gHb Est-mCnc: 160 mg/dL
Hgb A1c MFr Bld: 7.2 % — ABNORMAL HIGH (ref 4.8–5.6)

## 2022-03-09 LAB — TSH: TSH: 1.91 u[IU]/mL (ref 0.450–4.500)

## 2022-03-17 ENCOUNTER — Ambulatory Visit
Admission: EM | Admit: 2022-03-17 | Discharge: 2022-03-17 | Disposition: A | Discharge status: Home / Self Care | Payer: PPO

## 2022-03-17 DIAGNOSIS — R519 Headache, unspecified: Secondary | ICD-10-CM

## 2022-03-17 DIAGNOSIS — R509 Fever, unspecified: Secondary | ICD-10-CM

## 2022-03-17 DIAGNOSIS — J22 Unspecified acute lower respiratory infection: Secondary | ICD-10-CM

## 2022-03-17 MED ORDER — GUAIFENESIN ER 600 MG PO TB12: 600.0000 mg | ORAL_TABLET | Freq: Two times a day (BID) | ORAL | 0 refills | Status: DC | PRN

## 2022-03-17 MED ORDER — AMOXICILLIN-POT CLAVULANATE 875-125 MG PO TABS: 1.0000 | ORAL_TABLET | Freq: Two times a day (BID) | ORAL | 0 refills | Status: DC

## 2022-03-17 MED ORDER — PROMETHAZINE-DM 6.25-15 MG/5ML PO SYRP: 5.0000 mL | ORAL_SOLUTION | Freq: Four times a day (QID) | ORAL | 0 refills | Status: DC | PRN

## 2022-03-17 NOTE — ED Provider Notes (Signed)
[facility]    CSN: [id number] Arrival date & time: 03/17/22  3086      History   Chief Complaint Chief Complaint  Patient presents with   Sore Throat   Fever    HPI Stephen Roberts is a 56 y.o. male.   Patient presenting today with a week and a half of persisting fevers, sore throat, hacking productive cough, fatigue.  Denies chest pain, shortness of breath, abdominal pain, nausea vomiting or diarrhea.  So far taking Tylenol and NyQuil with mild temporary relief of symptoms.  Home COVID test negative x2.  History of pneumonia, COPD on albuterol as needed.  Multiple sick contacts recently.    Past Medical History:  Diagnosis Date   A-fib (HCC)    Arthritis    Diabetes (HCC)    GERD (gastroesophageal reflux disease)    Heart murmur    IDA (iron deficiency anemia)    Pneumonia 06/27/2006    Patient Active Problem List   Diagnosis Date Noted   Encounter for general adult medical examination with abnormal findings 03/07/2022   Hiatal hernia 01/12/2022   Persistent atrial fibrillation [facility])    Hospital discharge follow-up 09/01/2021   Atrial fibrillation with RVR  08/24/2021   Essential hypertension 08/24/2021   Iron deficiency anemia 08/24/2021   S/P cervical spinal fusion 06/30/2021   Chronic obstructive pulmonary disease ([place]) 06/30/2021   GERD (gastroesophageal reflux disease) 06/30/2021   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   Recurrent sinus infections 05/04/2020   Iron deficiency anemia due to chronic blood loss    Generalized weakness 03/24/2020   Dysphagia    Hyperlipidemia 02/13/2019   Type 2 diabetes mellitus with other specified complication ([place]) 57/84/6962   Cervical disc disorder with radiculopathy, high cervical region 05/30/2017   Chronic pain syndrome 11/10/2014   Allergic rhinitis 07/30/2014   Allergic dermatitis 07/30/2014   Prostate cancer screening 07/30/2014   Incarcerated epigastric hernia 01/11/2013   Incarcerated umbilical  hernia 95/28/4132   Tobacco abuse 01/11/2013    Past Surgical History:  Procedure Laterality Date   ANTERIOR FUSION CERVICAL SPINE     CARDIOVERSION N/A 10/05/2021   Procedure: CARDIOVERSION;  Surgeon: Sanda Klein, MD;  Location: [place];  Service: Cardiovascular;  Laterality: N/A;   CARDIOVERSION N/A 11/18/2021   Procedure: CARDIOVERSION;  Surgeon: Berniece Salines, DO;  Location: [place];  Service: Cardiovascular;  Laterality: N/A;   COLONOSCOPY WITH PROPOFOL N/A 03/26/2020   Procedure: COLONOSCOPY WITH PROPOFOL;  Surgeon: Daneil Dolin, MD;  [facility];  Service: Endoscopy;  Laterality: N/A;   disc in neck Left 05/2017   ESOPHAGEAL DILATION N/A 03/25/2020   Procedure: ESOPHAGEAL DILATION;  Surgeon: Rogene Houston, MD;  [facility];  Service: Endoscopy;  Laterality: N/A;   ESOPHAGOGASTRODUODENOSCOPY (EGD) WITH PROPOFOL N/A 03/25/2020   Procedure: ESOPHAGOGASTRODUODENOSCOPY (EGD) WITH PROPOFOL;  Surgeon: Rogene Houston, MD;  [facility];  Service: Endoscopy;  Laterality: N/A;   GIVENS CAPSULE STUDY N/A 04/13/2020   Procedure: GIVENS CAPSULE STUDY;  Surgeon: Daneil Dolin, MD;  [facility];  Service: Endoscopy;  Laterality: N/A;  7:30am   INSERTION OF MESH N/A 01/23/2013   Procedure: INSERTION OF MESH;  Surgeon: Adin Hector, MD;  [facility];  Service: General;  Laterality: N/A;   LEFT HEART CATH AND CORONARY ANGIOGRAPHY N/A 08/27/2021   Procedure: LEFT HEART CATH AND CORONARY ANGIOGRAPHY;  Surgeon: Martinique, Peter M, MD;  Location: [place] [facility];  Service: Cardiovascular;  Laterality: N/A;   POLYPECTOMY  03/26/2020   Procedure: POLYPECTOMY;  Surgeon: Daneil Dolin, MD;  [facility];  Service: Endoscopy;;   TONSILLECTOMY     as child   VENTRAL HERNIA REPAIR N/A 01/23/2013   Procedure: LAPAROSCOPIC MULTIPLE VENTRAL HERNIAS;  Surgeon: Adin Hector, MD;  [facility];  Service: General;   Laterality: N/A;       Home Medications    Prior to Admission medications   Medication Sig Start Date End Date Taking? Authorizing Provider  amoxicillin-clavulanate (AUGMENTIN) 875-125 MG tablet Take 1 tablet by mouth every 12 (twelve) hours. 03/17/22  Yes Volney American, PA-C  guaiFENesin (MUCINEX) 600 MG 12 hr tablet Take 1 tablet (600 mg total) by mouth 2 (two) times daily as needed. 03/17/22  Yes Volney American, PA-C  promethazine-dextromethorphan (PROMETHAZINE-DM) 6.25-15 MG/5ML syrup Take 5 mLs by mouth 4 (four) times daily as needed. 03/17/22  Yes Volney American, PA-C  Pseudoeph-Doxylamine-DM-APAP (NYQUIL PO) Take by mouth.   Yes [provider]  acetaminophen (TYLENOL) 650 MG CR tablet Take 1,300 mg by mouth every 8 (eight) hours as needed for pain.    [provider]  albuterol (VENTOLIN HFA) 108 (90 Base) MCG/ACT inhaler Inhale 2 puffs into the lungs every 6 (six) hours as needed for wheezing or shortness of breath. 03/07/22   Lindell Spar, MD  apixaban (ELIQUIS) 5 MG TABS tablet Take 1 tablet (5 mg total) by mouth 2 (two) times daily. 09/14/21   Loel Dubonnet, NP  Ascorbic Acid (VITAMIN C PO) Take 500 mg by mouth daily.    [provider]  esomeprazole (NEXIUM) 40 MG capsule Take 1 capsule (40 mg total) by mouth daily. 09/01/21   Lindell Spar, MD  ferrous sulfate 325 (65 FE) MG EC tablet TAKE 1 TABLET(325 MG) BY MOUTH TWICE DAILY 12/28/21   Lindell Spar, MD  flecainide (TAMBOCOR) 100 MG tablet TAKE 1 TABLET(100 MG) BY MOUTH EVERY 12 HOURS 02/16/22   Camnitz, Will Hassell Done, MD  fluticasone (FLONASE) 50 MCG/ACT nasal spray Place 2 sprays into both nostrils daily. 03/07/22   Lindell Spar, MD  furosemide (LASIX) 20 MG tablet Take 1 tablet (20 mg total) by mouth daily. 09/16/21 09/11/22  Loel Dubonnet, NP  Lactobacillus Rhamnosus, GG, (PROBIOTIC COLIC) LIQD Take 1 tablet by mouth 4 (four) times a week.    [provider]   metFORMIN (GLUCOPHAGE) 1000 MG tablet Take 1 tablet (1,000 mg total) by mouth 2 (two) times daily with a meal. 12/02/21   Lindell Spar, MD  metoprolol succinate (TOPROL-XL) 100 MG 24 hr tablet Take 1 tablet (100 mg total) by mouth daily. 11/18/21 06/16/22  Barrett, Evelene Croon, PA-C  Multiple Vitamin (MULTIVITAMIN) tablet Take 1 tablet by mouth daily.    [provider]  oxyCODONE-acetaminophen (PERCOCET/ROXICET) 5-325 MG tablet Take 1 tablet by mouth every 8 (eight) hours as needed for severe pain.    [provider]  potassium chloride SA (KLOR-CON M) 10 MEQ tablet Take 1 tablet (10 mEq total) by mouth daily. 09/16/21 09/11/22  Loel Dubonnet, NP  pravastatin (PRAVACHOL) 20 MG tablet TAKE 1 TABLET BY MOUTH 3 TIMES EVERY WEEK 01/04/22   Chandrasekhar, Mahesh A, MD  vitamin B-12 (CYANOCOBALAMIN) 1000 MCG tablet Take 1,000 mcg by mouth daily.    [provider]    Family History Family History  Problem Relation Age of Onset  Depression Mother    Hearing loss Mother    Hyperlipidemia Mother    Hypertension Mother    Stroke Mother    Early death Father    Diabetes Brother    Pancreatic cancer Paternal Uncle    Colon cancer Neg Hx     Social History Social History   Tobacco Use   Smoking status: Every Day    Packs/day: 0.50    Years: 42.00    Total pack years: 21.00    Types: Cigarettes    Start date: 1980   Smokeless tobacco: Never  Vaping Use   Vaping Use: Never used  Substance Use Topics   Alcohol use: Yes    Alcohol/week: 4.0 standard drinks of alcohol    Types: 4 Cans of beer per week    Comment: moderately   Drug use: Not Currently    Types: Cocaine    Comment: states he has tried it all. Last used cocaine a few months ago.      Allergies   Gabapentin, Lyrica [pregabalin], Crestor [rosuvastatin], and Farxiga [dapagliflozin]   Review of Systems Review of Systems Per HPI  Physical Exam Triage Vital Signs ED Triage Vitals  Enc Vitals  Group     BP 03/17/22 0822 (!) 139/96     Pulse Rate 03/17/22 0822 94     Resp 03/17/22 0822 18     Temp 03/17/22 0822 99.3 F (37.4 C)     Temp Source 03/17/22 0822 Oral     SpO2 03/17/22 0822 97 %     Weight --      Height --      Head Circumference --      Peak Flow --      Pain Score 03/17/22 0823 0     Pain Loc --      Pain Edu? --      Excl. in [place]? --    No data found.  Updated Vital Signs BP (!) 139/96 (BP Location: Right Arm)   Pulse 94   Temp 99.3 F (37.4 C) (Oral)   Resp 18   SpO2 97%   Visual Acuity Right Eye Distance:   Left Eye Distance:   Bilateral Distance:    Right Eye Near:   Left Eye Near:    Bilateral Near:     Physical Exam Vitals and nursing note reviewed.  Constitutional:      Appearance: He is well-developed.  HENT:     Head: Atraumatic.     Right Ear: External ear normal.     Left Ear: External ear normal.     Nose: Congestion present.     Mouth/Throat:     Mouth: Mucous membranes are moist.     Pharynx: Posterior oropharyngeal erythema present. No oropharyngeal exudate.  Eyes:     Conjunctiva/sclera: Conjunctivae normal.     Pupils: Pupils are equal, round, and reactive to light.  Cardiovascular:     Rate and Rhythm: Normal rate.  Pulmonary:     Effort: Pulmonary effort is normal. No respiratory distress.     Breath sounds: No wheezing or rales.  Musculoskeletal:        General: Normal range of motion.     Cervical back: Normal range of motion and neck supple.  Lymphadenopathy:     Cervical: No cervical adenopathy.  Skin:    General: Skin is warm and dry.  Neurological:     Mental Status: He is alert and oriented to person, place, and time.  Psychiatric:        Behavior: Behavior normal.      UC Treatments / Results  Labs (all labs ordered are listed, but only abnormal results are displayed) Labs Reviewed - No data to display  EKG   Radiology No results found.  Procedures Procedures (including critical care  time)  Medications Ordered in UC Medications - No data to display  Initial Impression / Assessment and Plan / UC Course  I have reviewed the triage vital signs and the nursing notes.  Pertinent labs & imaging results that were available during my care of the patient were reviewed by me and considered in my medical decision making (see chart for details).     Vital signs today are reassuring, given duration and worsening course of symptoms, start Augmentin, Phenergan DM, Mucinex and monitor closely for benefit.  Follow-up for worsening symptoms.  Defer viral testing given duration of symptoms.  Final Clinical Impressions(s) / UC Diagnoses   Final diagnoses:  Lower respiratory infection  Persistent fever  Acute nonintractable headache, unspecified headache type   Discharge Instructions   None    ED Prescriptions     Medication Sig Dispense Auth. Provider   amoxicillin-clavulanate (AUGMENTIN) 875-125 MG tablet Take 1 tablet by mouth every 12 (twelve) hours. 20 tablet Volney American, [place]   promethazine-dextromethorphan (PROMETHAZINE-DM) 6.25-15 MG/5ML syrup Take 5 mLs by mouth 4 (four) times daily as needed. 100 mL Volney American, PA-C   guaiFENesin (MUCINEX) 600 MG 12 hr tablet Take 1 tablet (600 mg total) by mouth 2 (two) times daily as needed. 20 tablet Volney American, [place]      PDMP not reviewed this encounter.   Volney American, [place] 03/17/22 1516

## 2022-03-17 NOTE — ED Triage Notes (Signed)
Pt reports fever 102.0 F, sore throat (worse at night), burning in throat, cough, fatigue x 1 week. Reports her granddaughter was sick 2 weeks ago.Tylenol and NyQuil give some relief.   Pt had 2 negative COVID test.

## 2022-03-23 ENCOUNTER — Ambulatory Visit (INDEPENDENT_AMBULATORY_CARE_PROVIDER_SITE_OTHER): Payer: PPO

## 2022-03-23 ENCOUNTER — Ambulatory Visit: Payer: PPO | Attending: Cardiology | Admitting: Cardiology

## 2022-03-23 ENCOUNTER — Encounter: Payer: Self-pay | Admitting: Cardiology

## 2022-03-23 VITALS — BP 110/82 | HR 68 | Ht 69.0 in | Wt 238.0 lb

## 2022-03-23 DIAGNOSIS — I480 Paroxysmal atrial fibrillation: Secondary | ICD-10-CM

## 2022-03-23 DIAGNOSIS — I493 Ventricular premature depolarization: Secondary | ICD-10-CM

## 2022-03-23 MED ORDER — METOPROLOL SUCCINATE ER 100 MG PO TB24: 100.0000 mg | ORAL_TABLET | Freq: Every day | ORAL | 2 refills | Status: DC

## 2022-03-23 NOTE — Progress Notes (Signed)
Electrophysiology Office Note   Date:  03/23/2022   ID:  Stephen Roberts, DOB 12/01/1965, MRN [id number]  PCP:  Lindell Spar, MD  Cardiologist:  Gasper Sells Primary Electrophysiologist:  Kamri Gotsch Meredith Leeds, MD   Chief Complaint: PVC, AF   History of Present Illness: Stephen Roberts is a 56 y.o. male who is being seen today for the evaluation of PVC at the request of Branch, Alphonse Guild, MD. Presenting today for electrophysiology evaluation.  He has been feeling overall well from a heart rhythm standpoint. Has much less frequent heart palpitations than earlier this year, happening every few days for a couple seconds. He describes the episodes as feeling like "a flat tire flapping" in his chest with associated chest tightness. These episodes are much less frequent and much less severe than prior. He is, however, having some respiratory issues, was diagnosed about 2 weeks ago with pre-pneumonia, had fever, was rx antibiotic. He is feeling better from this too, still having nasal drainage.  He sleeps very poorly at night and this is is main complaint today, often only getting 4-5 hrs throughout a night, never consecutive. Wakes up with HA. Significant daytime sleepiness. Diagnosed with OSA, but unable to tolerate the facial and nasal masks of CPAP machine. He recently saw Dr. Radford Pax and would like to pursue the Inspira device if possible. He has also been recommended to see ENT.  He continues to smoke cigarettes, about 1/3 pack a day, down from 1-1 and a half / day.  He continues to binge drink beer. He drinks about once or twice a week, 12-14 beers when he does drink.    Today, he denies symptoms of palpitations, chest pain, shortness of breath, orthopnea, PND, lower extremity edema, claudication, dizziness, presyncope, syncope, bleeding, or neurologic sequela. The patient is tolerating medications without difficulties.    Past Medical History:  Diagnosis Date   A-fib (HCC)     Arthritis    Diabetes (HCC)    GERD (gastroesophageal reflux disease)    Heart murmur    IDA (iron deficiency anemia)    Pneumonia 06/27/2006   Past Surgical History:  Procedure Laterality Date   ANTERIOR FUSION CERVICAL SPINE     CARDIOVERSION N/A 10/05/2021   Procedure: CARDIOVERSION;  Surgeon: Sanda Klein, MD;  Location: [place];  Service: Cardiovascular;  Laterality: N/A;   CARDIOVERSION N/A 11/18/2021   Procedure: CARDIOVERSION;  Surgeon: Berniece Salines, DO;  Location: [place];  Service: Cardiovascular;  Laterality: N/A;   COLONOSCOPY WITH PROPOFOL N/A 03/26/2020   Procedure: COLONOSCOPY WITH PROPOFOL;  Surgeon: Daneil Dolin, MD;  [facility];  Service: Endoscopy;  Laterality: N/A;   disc in neck Left 05/2017   ESOPHAGEAL DILATION N/A 03/25/2020   Procedure: ESOPHAGEAL DILATION;  Surgeon: Rogene Houston, MD;  [facility];  Service: Endoscopy;  Laterality: N/A;   ESOPHAGOGASTRODUODENOSCOPY (EGD) WITH PROPOFOL N/A 03/25/2020   Procedure: ESOPHAGOGASTRODUODENOSCOPY (EGD) WITH PROPOFOL;  Surgeon: Rogene Houston, MD;  [facility];  Service: Endoscopy;  Laterality: N/A;   GIVENS CAPSULE STUDY N/A 04/13/2020   Procedure: GIVENS CAPSULE STUDY;  Surgeon: Daneil Dolin, MD;  [facility];  Service: Endoscopy;  Laterality: N/A;  7:30am   INSERTION OF MESH N/A 01/23/2013   Procedure: INSERTION OF MESH;  Surgeon: Adin Hector, MD;  [facility];  Service: General;  Laterality: N/A;   LEFT HEART CATH AND CORONARY ANGIOGRAPHY N/A 08/27/2021   Procedure: LEFT HEART  CATH AND CORONARY ANGIOGRAPHY;  Surgeon: Martinique, Peter M, MD;  Location: [place] [facility];  Service: Cardiovascular;  Laterality: N/A;   POLYPECTOMY  03/26/2020   Procedure: POLYPECTOMY;  Surgeon: Daneil Dolin, MD;  [facility];  Service: Endoscopy;;   TONSILLECTOMY     as child   VENTRAL HERNIA REPAIR N/A 01/23/2013   Procedure: LAPAROSCOPIC  MULTIPLE VENTRAL HERNIAS;  Surgeon: Adin Hector, MD;  [facility];  Service: General;  Laterality: N/A;     Current Outpatient Medications  Medication Sig Dispense Refill   acetaminophen (TYLENOL) 650 MG CR tablet Take 1,300 mg by mouth every 8 (eight) hours as needed for pain.     albuterol (VENTOLIN HFA) 108 (90 Base) MCG/ACT inhaler Inhale 2 puffs into the lungs every 6 (six) hours as needed for wheezing or shortness of breath. 18 g 2   amoxicillin-clavulanate (AUGMENTIN) 875-125 MG tablet Take 1 tablet by mouth every 12 (twelve) hours. 20 tablet 0   apixaban (ELIQUIS) 5 MG TABS tablet Take 1 tablet (5 mg total) by mouth 2 (two) times daily. 60 tablet 5   Ascorbic Acid (VITAMIN C PO) Take 500 mg by mouth daily.     esomeprazole (NEXIUM) 40 MG capsule Take 1 capsule (40 mg total) by mouth daily. 90 capsule 3   ferrous sulfate 325 (65 FE) MG EC tablet TAKE 1 TABLET(325 MG) BY MOUTH TWICE DAILY 60 tablet 3   flecainide (TAMBOCOR) 100 MG tablet TAKE 1 TABLET(100 MG) BY MOUTH EVERY 12 HOURS 60 tablet 11   fluticasone (FLONASE) 50 MCG/ACT nasal spray Place 2 sprays into both nostrils daily. 16 g 6   furosemide (LASIX) 20 MG tablet Take 1 tablet (20 mg total) by mouth daily. 90 tablet 3   guaiFENesin (MUCINEX) 600 MG 12 hr tablet Take 1 tablet (600 mg total) by mouth 2 (two) times daily as needed. 20 tablet 0   Lactobacillus Rhamnosus, GG, (PROBIOTIC COLIC) LIQD Take 1 tablet by mouth 4 (four) times a week.     metFORMIN (GLUCOPHAGE) 1000 MG tablet Take 1 tablet (1,000 mg total) by mouth 2 (two) times daily with a meal. 60 tablet 5   Multiple Vitamin (MULTIVITAMIN) tablet Take 1 tablet by mouth daily.     oxyCODONE-acetaminophen (PERCOCET/ROXICET) 5-325 MG tablet Take 1 tablet by mouth every 8 (eight) hours as needed for severe pain.     potassium chloride SA (KLOR-CON M) 10 MEQ tablet Take 1 tablet (10 mEq total) by mouth daily. 90 tablet 3   pravastatin (PRAVACHOL) 20 MG tablet TAKE 1  TABLET BY MOUTH 3 TIMES EVERY WEEK 15 tablet 10   promethazine-dextromethorphan (PROMETHAZINE-DM) 6.25-15 MG/5ML syrup Take 5 mLs by mouth 4 (four) times daily as needed. 100 mL 0   Pseudoeph-Doxylamine-DM-APAP (NYQUIL PO) Take by mouth.     vitamin B-12 (CYANOCOBALAMIN) 1000 MCG tablet Take 1,000 mcg by mouth daily.     metoprolol succinate (TOPROL-XL) 100 MG 24 hr tablet Take 1 tablet (100 mg total) by mouth daily. 90 tablet 2   No current facility-administered medications for this visit.   Facility-Administered Medications Ordered in Other Visits  Medication Dose Route Frequency Provider Last Rate Last Admin   iron sucrose (VENOFER) 200 mg in sodium chloride 0.9 % 100 mL IVPB  200 mg Intravenous Once Pennington, Rebekah M, PA-C        Allergies:   Gabapentin, Lyrica [pregabalin], Crestor [rosuvastatin], and Farxiga [dapagliflozin]   Social History:  The patient  reports that he has been smoking cigarettes. He started smoking about 43 years ago. He has a 21.00 pack-year smoking history. He has never used smokeless tobacco. He reports current alcohol use of about 4.0 standard drinks of alcohol per week. He reports that he does not currently use drugs after having used the following drugs: Cocaine.   Family History:  The patient's family history includes Depression in his mother; Diabetes in his brother; Early death in his father; Hearing loss in his mother; Hyperlipidemia in his mother; Hypertension in his mother; Pancreatic cancer in his paternal uncle; Stroke in his mother.    ROS:  Please see the history of present illness.   Otherwise, review of systems is positive for none.   All other systems are reviewed and negative.    PHYSICAL EXAM: VS:  BP 110/82   Pulse 68   Ht '5\' 9"'$  (1.753 m)   Wt 238 lb (108 kg)   SpO2 97%   BMI 35.15 kg/m  , BMI Body mass index is 35.15 kg/m. GEN: Well nourished, well developed, in no acute distress  HEENT: normal  Neck: no JVD, carotid bruits, or  masses Cardiac: RRR; no murmurs, rubs, or gallops, no edema  Respiratory:  clear to auscultation bilaterally, normal work of breathing GI: soft, nontender, nondistended, + BS MS: no deformity or atrophy  Skin: warm and dry,  Neuro:  Strength and sensation are intact Psych: euthymic mood, full affect  EKG:  EKG is ordered today. Personal review of the ekg ordered NSR at 68bpm  Recent Labs: 09/28/2021: BNP 210.8 11/16/2021: Magnesium 1.8 03/08/2022: ALT 31; BUN 20; Creatinine, Ser 0.93; Hemoglobin 15.8; Platelets 132; Potassium 4.4; Sodium 138; TSH 1.910    Lipid Panel     Component Value Date/Time   CHOL 178 03/08/2022 0830   TRIG 147 03/08/2022 0830   HDL 36 (L) 03/08/2022 0830   CHOLHDL 4.9 03/08/2022 0830   CHOLHDL 4.2 02/13/2019 1258   VLDL 11 08/31/2016 0935   LDLCALC 116 (H) 03/08/2022 0830   LDLCALC 97 02/13/2019 1258     Wt Readings from Last 3 Encounters:  03/23/22 238 lb (108 kg)  03/07/22 239 lb 9.6 oz (108.7 kg)  01/12/22 239 lb 12.8 oz (108.8 kg)      Other studies Reviewed: Additional studies/ records that were reviewed today include: echo, recent hospitalization notes, PA Charlcie Cradle note    ASSESSMENT AND PLAN:  #) PVC PVC burden on June 2023 zio 16% Echo 08/2021 showed LVEF 40-45%, severely dilated LA, moderately dilated RA Was started on flecainide July 2023 On metop XL 100 Seems to be better controlled with less palpitations, though still present.  Zeb Rawl updated updated echo to eval LVEF Updated zio to confirm better control of PVC burden.  #) AF CHA2DS2-VASc Score = 3 (CHF, HTN, DM) Current medicines are reviewed at length with the patient today.   AC with Eliquis - metop and flecainide as above - zio as above to eval   #) OSA - uncontrolled currently. Kailia Starry send msg to Dr. Radford Pax regarding Desma Maxim. Encouraged to loose weight.  #) Tobacco Abuse - congratulated on reduction of cigarette usage, encouraged to identify triggers and plan for how to  overcome them. Use grandkids as motivation for quitting/further reducetion  #) ETOH Abuse - discussed cutting down on amount, patient desires to reduce usage  #) HTN - well-controlled today. Continue current meds   The patient does not have concerns regarding his medicines.  The following changes were  made today:  none  Labs/ tests ordered today include: zio, echo  Orders Placed This Encounter  Procedures   LONG TERM MONITOR (3-14 DAYS)   EKG 12-Lead   ECHOCARDIOGRAM COMPLETE     Disposition:   FU with Ozell Ferrera 6 month  Signed, Consetta Cosner Meredith Leeds, MD  03/23/2022 1:20 PM     [address] [place] [address] [phone] (office) [phone] (fax)  I have seen and examined this patient with Mamie Levers.  Agree with above, note added to reflect my findings.  To clinic today feeling well.  He has a history of PVCs, atrial fibrillation, and mild systolic heart failure.  He states that since he left the hospital, he has done much better with quite a few less PVCs and has noted no atrial fibrillation.  His respiratory status is improved.  He does continue to complain of trouble with his CPAP, feeling like he does not sleep well at night.  Aside from that he has no major complaints.  GEN: Well nourished, well developed, in no acute distress  HEENT: normal  Neck: no JVD, carotid bruits, or masses Cardiac: RRR; no murmurs, rubs, or gallops,no edema  Respiratory:  clear to auscultation bilaterally, normal work of breathing GI: soft, nontender, nondistended, + BS MS: no deformity or atrophy  Skin: warm and dry Neuro:  Strength and sensation are intact Psych: euthymic mood, full affect   PVCs: Had an elevated burden at around 18% previously.  His ejection fraction was mildly reduced.  He has been started on flecainide.  He feels much improved on this.  We Vonita Calloway order a 7-day monitor to determine his PVC burden since being on flecainide.   If his PVC burden is low, Jeilani Grupe likely need a repeat echo to ensure his ejection fraction is improved. Persistent atrial fibrillation: CHA2DS2-VASc of 3.  Currently on Eliquis and flecainide as above.  He has had no further episodes.  We Cherae Marton continue with current management. Secondary hypercoagulable state: Currently on Eliquis for atrial fibrillation as above Chronic systolic heart failure: No obvious volume overload.  We Arcola Freshour repeat echo now that his PVCs appear to be suppressed. Obstructive sleep apnea: Has been unable to tolerate CPAP.  His sleeping has been quite poor.  We Alamin Mccuiston send a message to sleep medicine.  Gilma Bessette M. Jireh Elmore MD 03/23/2022 1:20 PM

## 2022-03-23 NOTE — Progress Notes (Unsigned)
Enrolled patient for a 14 day Zio XT  monitor to be mailed to patients home  °

## 2022-03-23 NOTE — Patient Instructions (Addendum)
Medication Instructions:  Your physician recommends that you continue on your current medications as directed. Please refer to the Current Medication list given to you today.  *If you need a refill on your cardiac medications before your next appointment, please call your pharmacy*   Lab Work: None ordered   Testing/Procedures: Your physician has requested that you have an echocardiogram. Echocardiography is a painless test that uses sound waves to create images of your heart. It provides your doctor with information about the size and shape of your heart and how well your heart's chambers and valves are working. This procedure takes approximately one hour. There are no restrictions for this procedure. Please do NOT wear cologne, perfume, aftershave, or lotions (deodorant is allowed). Please arrive 15 minutes prior to your appointment time.                             ZIO XT- Long Term Monitor Instructions  Your physician has requested you wear a ZIO patch monitor for 14 days.  This is a single patch monitor. [employer] supplies one patch monitor per enrollment. Additional stickers are not available. Please do not apply patch if you will be having a Nuclear Stress Test,  Echocardiogram, Cardiac CT, MRI, or Chest Xray during the period you would be wearing the  monitor. The patch cannot be worn during these tests. You cannot remove and re-apply the  ZIO XT patch monitor.  Your ZIO patch monitor will be mailed 3 day USPS to your address on file. It may take 3-5 days  to receive your monitor after you have been enrolled.  Once you have received your monitor, please review the enclosed instructions. Your monitor  has already been registered assigning a specific monitor serial # to you.  Billing and Patient Assistance Program Information  We have supplied [employer] with any of your insurance information on file for billing purposes. [employer] offers a sliding scale Patient Assistance Program  for patients that do not have  insurance, or whose insurance does not completely cover the cost of the ZIO monitor.  You must apply for the Patient Assistance Program to qualify for this discounted rate.  To apply, please call [employer] at [phone], select option 4, select option 2, ask to apply for  Patient Assistance Program. Theodore Demark will ask your household income, and how many people  are in your household. They will quote your out-of-pocket cost based on that information.  [employer] will also be able to set up a 56-month interest-free payment plan if needed.  Applying the monitor   Shave hair from upper left chest.  Hold abrader disc by orange tab. Rub abrader in 40 strokes over the upper left chest as  indicated in your monitor instructions.  Clean area with 4 enclosed alcohol pads. Let dry.  Apply patch as indicated in monitor instructions. Patch will be placed under collarbone on left  side of chest with arrow pointing upward.  Rub patch adhesive wings for 2 minutes. Remove white label marked "1". Remove the white  label marked "2". Rub patch adhesive wings for 2 additional minutes.  While looking in a mirror, press and release button in center of patch. A small green light will  flash 3-4 times. This will be your only indicator that the monitor has been turned on.  Do not shower for the first 24 hours. You may shower after the first 24 hours.  Press the button if you feel  a symptom. You will hear a small click. Record Date, Time and  Symptom in the Patient Logbook.  When you are ready to remove the patch, follow instructions on the last 2 pages of Patient  Logbook. Stick patch monitor onto the last page of Patient Logbook.  Place Patient Logbook in the blue and white box. Use locking tab on box and tape box closed  securely. The blue and white box has prepaid postage on it. Please place it in the mailbox as  soon as possible. Your physician should have your test results  approximately 7 days after the  monitor has been mailed back to [facility].  Call [employer] at [phone] if you have questions regarding  your ZIO XT patch monitor. Call them immediately if you see an orange light blinking on your  monitor.  If your monitor falls off in less than 4 days, contact our Monitor department at [phone].  If your monitor becomes loose or falls off after 4 days call [employer] at [phone] for  suggestions on securing your monitor   Follow-Up: At [facility], you and your health needs are our priority.  As part of our continuing mission to provide you with exceptional heart care, we have created designated Provider Care Teams.  These Care Teams include your primary Cardiologist (physician) and Advanced Practice Providers (APPs -  Physician Assistants and Nurse Practitioners) who all work together to provide you with the care you need, when you need it.  Your next appointment:   6 month(s)  The format for your next appointment:   In Person  Provider:   Allegra Lai, MD    Scheduled a follow up with your ENT   Thank you for choosing [facility]!!   Trinidad Curet, RN [phone]  Other Instructions   Important Information About Sugar

## 2022-03-28 DIAGNOSIS — I480 Paroxysmal atrial fibrillation: Secondary | ICD-10-CM

## 2022-03-28 DIAGNOSIS — I493 Ventricular premature depolarization: Secondary | ICD-10-CM | POA: Diagnosis not present

## 2022-03-31 ENCOUNTER — Encounter: Payer: Self-pay | Admitting: Cardiology

## 2022-04-01 ENCOUNTER — Other Ambulatory Visit: Payer: Self-pay

## 2022-04-01 DIAGNOSIS — I480 Paroxysmal atrial fibrillation: Secondary | ICD-10-CM

## 2022-04-01 MED ORDER — APIXABAN 5 MG PO TABS: 5.0000 mg | ORAL_TABLET | Freq: Two times a day (BID) | ORAL | 5 refills | Status: DC

## 2022-04-01 NOTE — Telephone Encounter (Signed)
Prescription refill request for Eliquis received. Indication: Afib  Last office visit: 03/23/22 ([place])  Scr: 0.93 (03/08/22) Age: 56 Weight: 108kg  Appropriate dose and refill sent to requested pharmacy.

## 2022-04-04 ENCOUNTER — Telehealth: Payer: Self-pay

## 2022-04-05 ENCOUNTER — Telehealth: Payer: Self-pay | Admitting: *Deleted

## 2022-04-05 NOTE — Telephone Encounter (Signed)
**Note De-Identified [place] Obfuscation** The pt left his completed [employer] application for Eliquis assistance at the office with documents.  I have completed the providers page of his application and have e-mailed all to Dr Macky Lower nurse so she can obtain his signature, date it, and to then fax all to [facility] at the fax number written on the cover letter included.

## 2022-04-05 NOTE — Telephone Encounter (Signed)
Form has been signed and faxed to [employer], confirmation received.

## 2022-04-05 NOTE — Telephone Encounter (Signed)
I will send to Imagene Gurney to make him an office visit.

## 2022-04-05 NOTE — Telephone Encounter (Signed)
-----   Message from Sueanne Margarita, MD sent at 03/24/2022  1:08 PM EST ----- Please get back into see me in the office for problems with CPAP ----- Message ----- From: Constance Haw, MD Sent: 03/23/2022   1:20 PM EST To: Sueanne Margarita, MD  I saw Stephen Roberts today.  Fortunately he has remained in sinus rhythm and has not had PVCs.  I think his ejection fraction will likely improve.  He is quite concerned as he feels that he has not been sleeping well and is having trouble with his CPAP.  He wanted me to reach out and let you know to see if you wanted to see him back in clinic.  Thanks.

## 2022-04-13 DIAGNOSIS — I493 Ventricular premature depolarization: Secondary | ICD-10-CM | POA: Diagnosis not present

## 2022-04-13 DIAGNOSIS — I480 Paroxysmal atrial fibrillation: Secondary | ICD-10-CM | POA: Diagnosis not present

## 2022-04-13 NOTE — Telephone Encounter (Signed)
LVM to schedule virtual visit with Dr. Radford Pax for tomorrow.

## 2022-04-15 NOTE — Telephone Encounter (Signed)
**Note De-Identified Kee Drudge Obfuscation** Pt has been approved for Eliquis assistance until 05/07/2022 per letter from [facility]Marland Kitchen [id number]  The letter states that they have notified the pt of this approval as well.

## 2022-04-18 ENCOUNTER — Ambulatory Visit (HOSPITAL_COMMUNITY): Payer: PPO | Attending: Cardiology

## 2022-04-18 ENCOUNTER — Encounter: Payer: Self-pay | Admitting: Internal Medicine

## 2022-04-18 DIAGNOSIS — I480 Paroxysmal atrial fibrillation: Secondary | ICD-10-CM | POA: Diagnosis not present

## 2022-04-18 DIAGNOSIS — I493 Ventricular premature depolarization: Secondary | ICD-10-CM | POA: Diagnosis not present

## 2022-04-18 LAB — ECHOCARDIOGRAM COMPLETE
Area-P 1/2: 3.95 cm2
S' Lateral: 3.6 cm

## 2022-04-28 ENCOUNTER — Inpatient Hospital Stay: Payer: PPO

## 2022-05-02 ENCOUNTER — Inpatient Hospital Stay: Payer: PPO | Attending: Hematology

## 2022-05-02 DIAGNOSIS — Z7289 Other problems related to lifestyle: Secondary | ICD-10-CM | POA: Diagnosis not present

## 2022-05-02 DIAGNOSIS — K922 Gastrointestinal hemorrhage, unspecified: Secondary | ICD-10-CM | POA: Insufficient documentation

## 2022-05-02 DIAGNOSIS — Z823 Family history of stroke: Secondary | ICD-10-CM | POA: Insufficient documentation

## 2022-05-02 DIAGNOSIS — R5383 Other fatigue: Secondary | ICD-10-CM | POA: Diagnosis not present

## 2022-05-02 DIAGNOSIS — Z7901 Long term (current) use of anticoagulants: Secondary | ICD-10-CM | POA: Diagnosis not present

## 2022-05-02 DIAGNOSIS — M549 Dorsalgia, unspecified: Secondary | ICD-10-CM | POA: Diagnosis not present

## 2022-05-02 DIAGNOSIS — Z8249 Family history of ischemic heart disease and other diseases of the circulatory system: Secondary | ICD-10-CM | POA: Insufficient documentation

## 2022-05-02 DIAGNOSIS — E669 Obesity, unspecified: Secondary | ICD-10-CM | POA: Diagnosis not present

## 2022-05-02 DIAGNOSIS — R591 Generalized enlarged lymph nodes: Secondary | ICD-10-CM | POA: Insufficient documentation

## 2022-05-02 DIAGNOSIS — Z8349 Family history of other endocrine, nutritional and metabolic diseases: Secondary | ICD-10-CM | POA: Insufficient documentation

## 2022-05-02 DIAGNOSIS — I4891 Unspecified atrial fibrillation: Secondary | ICD-10-CM | POA: Insufficient documentation

## 2022-05-02 DIAGNOSIS — D110 Benign neoplasm of parotid gland: Secondary | ICD-10-CM | POA: Insufficient documentation

## 2022-05-02 DIAGNOSIS — E119 Type 2 diabetes mellitus without complications: Secondary | ICD-10-CM | POA: Insufficient documentation

## 2022-05-02 DIAGNOSIS — Z79899 Other long term (current) drug therapy: Secondary | ICD-10-CM | POA: Diagnosis not present

## 2022-05-02 DIAGNOSIS — R519 Headache, unspecified: Secondary | ICD-10-CM | POA: Diagnosis not present

## 2022-05-02 DIAGNOSIS — K219 Gastro-esophageal reflux disease without esophagitis: Secondary | ICD-10-CM | POA: Insufficient documentation

## 2022-05-02 DIAGNOSIS — K118 Other diseases of salivary glands: Secondary | ICD-10-CM

## 2022-05-02 DIAGNOSIS — Z818 Family history of other mental and behavioral disorders: Secondary | ICD-10-CM | POA: Diagnosis not present

## 2022-05-02 DIAGNOSIS — R0602 Shortness of breath: Secondary | ICD-10-CM | POA: Diagnosis not present

## 2022-05-02 DIAGNOSIS — Z822 Family history of deafness and hearing loss: Secondary | ICD-10-CM | POA: Diagnosis not present

## 2022-05-02 DIAGNOSIS — D500 Iron deficiency anemia secondary to blood loss (chronic): Secondary | ICD-10-CM | POA: Diagnosis not present

## 2022-05-02 DIAGNOSIS — G8929 Other chronic pain: Secondary | ICD-10-CM | POA: Diagnosis not present

## 2022-05-02 DIAGNOSIS — F17210 Nicotine dependence, cigarettes, uncomplicated: Secondary | ICD-10-CM | POA: Diagnosis not present

## 2022-05-02 DIAGNOSIS — R059 Cough, unspecified: Secondary | ICD-10-CM | POA: Diagnosis not present

## 2022-05-02 DIAGNOSIS — Z800 Family history of malignant neoplasm of digestive organs: Secondary | ICD-10-CM | POA: Diagnosis not present

## 2022-05-02 LAB — CBC WITH DIFFERENTIAL/PLATELET
Abs Immature Granulocytes: 0.02 10*3/uL (ref 0.00–0.07)
Basophils Absolute: 0.1 10*3/uL (ref 0.0–0.1)
Basophils Relative: 1 %
Eosinophils Absolute: 0.2 10*3/uL (ref 0.0–0.5)
Eosinophils Relative: 3 %
HCT: 42.2 % (ref 39.0–52.0)
Hemoglobin: 14.3 g/dL (ref 13.0–17.0)
Immature Granulocytes: 0 %
Lymphocytes Relative: 30 %
Lymphs Abs: 1.9 10*3/uL (ref 0.7–4.0)
MCH: 34.6 pg — ABNORMAL HIGH (ref 26.0–34.0)
MCHC: 33.9 g/dL (ref 30.0–36.0)
MCV: 102.2 fL — ABNORMAL HIGH (ref 80.0–100.0)
Monocytes Absolute: 0.5 10*3/uL (ref 0.1–1.0)
Monocytes Relative: 8 %
Neutro Abs: 3.6 10*3/uL (ref 1.7–7.7)
Neutrophils Relative %: 58 %
Platelets: 141 10*3/uL — ABNORMAL LOW (ref 150–400)
RBC: 4.13 MIL/uL — ABNORMAL LOW (ref 4.22–5.81)
RDW: 12.5 % (ref 11.5–15.5)
WBC: 6.3 10*3/uL (ref 4.0–10.5)
nRBC: 0 % (ref 0.0–0.2)

## 2022-05-02 LAB — IRON AND TIBC
Iron: 67 ug/dL (ref 45–182)
Saturation Ratios: 23 % (ref 17.9–39.5)
TIBC: 296 ug/dL (ref 250–450)
UIBC: 229 ug/dL

## 2022-05-02 LAB — LACTATE DEHYDROGENASE: LDH: 134 U/L (ref 98–192)

## 2022-05-02 LAB — FERRITIN: Ferritin: 107 ng/mL (ref 24–336)

## 2022-05-05 ENCOUNTER — Inpatient Hospital Stay (HOSPITAL_BASED_OUTPATIENT_CLINIC_OR_DEPARTMENT_OTHER): Payer: PPO | Admitting: Hematology

## 2022-05-05 VITALS — BP 117/87 | HR 87 | Temp 98.8°F | Resp 17 | Ht 69.0 in | Wt 239.2 lb

## 2022-05-05 DIAGNOSIS — R591 Generalized enlarged lymph nodes: Secondary | ICD-10-CM | POA: Diagnosis not present

## 2022-05-05 DIAGNOSIS — D500 Iron deficiency anemia secondary to blood loss (chronic): Secondary | ICD-10-CM | POA: Diagnosis not present

## 2022-05-05 NOTE — Patient Instructions (Signed)
[place] at [facility] Discharge Instructions   You were seen and examined today by Dr. Delton Coombes.  He reviewed your lab work which are normal/stable.   We will plan to give you one dose of iron.   We will see you back in 6 months with repeat lab work.    Thank you for choosing [place] at [facility] to provide your oncology and hematology care.  To afford each patient quality time with our provider, please arrive at least 15 minutes before your scheduled appointment time.   If you have a lab appointment with the [place] [facility] please come in thru the Main Entrance and check in at the main information desk.  You need to re-schedule your appointment should you arrive 10 or more minutes late.  We strive to give you quality time with our providers, and arriving late affects you and other patients whose appointments are after yours.  Also, if you no show three or more times for appointments you may be dismissed from the clinic at the providers discretion.     Again, thank you for choosing [facility].  Our hope is that these requests will decrease the amount of time that you wait before being seen by our physicians.       _____________________________________________________________  Should you have questions after your visit to [facility], please contact our office at [phone] and follow the prompts.  Our office hours are 8:00 a.m. and 4:30 p.m. Monday - Friday.  Please note that voicemails left after 4:00 p.m. may not be returned until the following business day.  We are closed weekends and major holidays.  You do have access to a nurse 24-7, just call the main number to the clinic [phone] and do not press any options, hold on the line and a nurse will answer the phone.    For prescription refill requests, have your pharmacy contact our office and allow 72 hours.    Due to Covid, you will need to wear a  mask upon entering the hospital. If you do not have a mask, a mask will be given to you at the Main Entrance upon arrival. For doctor visits, patients may have 1 support person age 4 or older with them. For treatment visits, patients can not have anyone with them due to social distancing guidelines and our immunocompromised population.

## 2022-05-05 NOTE — Progress Notes (Signed)
[facility] [place], [place] [address]   CLINIC:  Medical Oncology/Hematology  PCP:  Lindell Spar, MD [address] / [place] [address] [phone]   REASON FOR VISIT:  Follow-up for lymphadenopathy and parotid mass  PRIOR THERAPY: none  NGS Results: not done  CURRENT THERAPY: intermittent Venofer last on 08/25/2020  BRIEF ONCOLOGIC HISTORY:  Oncology History   No history exists.    CANCER STAGING:  Cancer Staging  No matching staging information was found for the patient.  INTERVAL HISTORY:  Stephen Roberts, a 56 y.o. male, seen for follow-up of iron deficiency state, and neck lymphadenopathy.  Reports energy levels of 70%.  Chronic back pain is stable.  He received Feraheme on 11/22/2021 and felt better about 2 weeks after last dose.  Denies any dysphagia or odynophagia.  REVIEW OF SYSTEMS:  Review of Systems  Constitutional:  Negative for appetite change and fatigue.  Respiratory:  Positive for cough and shortness of breath.   Musculoskeletal:  Positive for back pain (7/10).  Neurological:  Positive for headaches.  All other systems reviewed and are negative.   PAST MEDICAL/SURGICAL HISTORY:  Past Medical History:  Diagnosis Date   A-fib (HCC)    Arthritis    Diabetes (HCC)    GERD (gastroesophageal reflux disease)    Heart murmur    IDA (iron deficiency anemia)    Pneumonia 06/27/2006   Past Surgical History:  Procedure Laterality Date   ANTERIOR FUSION CERVICAL SPINE     CARDIOVERSION N/A 10/05/2021   Procedure: CARDIOVERSION;  Surgeon: Sanda Klein, MD;  Location: [facility];  Service: Cardiovascular;  Laterality: N/A;   CARDIOVERSION N/A 11/18/2021   Procedure: CARDIOVERSION;  Surgeon: Berniece Salines, DO;  Location: [facility];  Service: Cardiovascular;  Laterality: N/A;   COLONOSCOPY WITH PROPOFOL N/A 03/26/2020   Procedure: COLONOSCOPY WITH PROPOFOL;  Surgeon: Daneil Dolin, MD;  [facility];  Service: Endoscopy;  Laterality: N/A;   disc in neck Left 05/2017   ESOPHAGEAL DILATION N/A 03/25/2020   Procedure: ESOPHAGEAL DILATION;  Surgeon: Rogene Houston, MD;  [facility];  Service: Endoscopy;  Laterality: N/A;   ESOPHAGOGASTRODUODENOSCOPY (EGD) WITH PROPOFOL N/A 03/25/2020   Procedure: ESOPHAGOGASTRODUODENOSCOPY (EGD) WITH PROPOFOL;  Surgeon: Rogene Houston, MD;  [facility];  Service: Endoscopy;  Laterality: N/A;   GIVENS CAPSULE STUDY N/A 04/13/2020   Procedure: GIVENS CAPSULE STUDY;  Surgeon: Daneil Dolin, MD;  [facility];  Service: Endoscopy;  Laterality: N/A;  7:30am   INSERTION OF MESH N/A 01/23/2013   Procedure: INSERTION OF MESH;  Surgeon: Adin Hector, MD;  [facility];  Service: General;  Laterality: N/A;   LEFT HEART CATH AND CORONARY ANGIOGRAPHY N/A 08/27/2021   Procedure: LEFT HEART CATH AND CORONARY ANGIOGRAPHY;  Surgeon: Martinique, Peter M, MD;  Location: [facility];  Service: Cardiovascular;  Laterality: N/A;   POLYPECTOMY  03/26/2020   Procedure: POLYPECTOMY;  Surgeon: Daneil Dolin, MD;  [facility];  Service: Endoscopy;;   TONSILLECTOMY     as child   VENTRAL HERNIA REPAIR N/A 01/23/2013   Procedure: LAPAROSCOPIC MULTIPLE VENTRAL HERNIAS;  Surgeon: Adin Hector, MD;  [facility];  Service: General;  Laterality: N/A;    SOCIAL HISTORY:  Social History   Socioeconomic History   Marital status: Married    Spouse name: Not on file   Number of children:  Not on file   Years of education: Not on file   Highest education level: Not on file  Occupational History   Not on file  Tobacco Use   Smoking status: Every Day    Packs/day: 0.50    Years: 42.00    Total pack years: 21.00    Types: Cigarettes    Start date: 36   Smokeless tobacco: Never  Vaping Use   Vaping Use: Never used  Substance and Sexual Activity   Alcohol use: Yes    Alcohol/week: 4.0 standard drinks of  alcohol    Types: 4 Cans of beer per week    Comment: moderately   Drug use: Not Currently    Types: Cocaine    Comment: states he has tried it all. Last used cocaine a few months ago.    Sexual activity: Yes  Other Topics Concern   Not on file  Social History Narrative   Not on file   Social Determinants of Health   Financial Resource Strain: Low Risk  (02/03/2022)   Overall Financial Resource Strain (CARDIA)    Difficulty of Paying Living Expenses: Not hard at all  Food Insecurity: No Food Insecurity (02/03/2022)   Hunger Vital Sign    Worried About Running Out of Food in the Last Year: Never true    Ran Out of Food in the Last Year: Never true  Transportation Needs: No Transportation Needs (02/03/2022)   PRAPARE - Hydrologist (Medical): No    Lack of Transportation (Non-Medical): No  Physical Activity: Insufficiently Active (02/03/2022)   Exercise Vital Sign    Days of Exercise per Week: 5 days    Minutes of Exercise per Session: 20 min  Stress: No Stress Concern Present (02/03/2022)   [place]    Feeling of Stress : Only a little  Social Connections: Socially Isolated (02/03/2022)   Social Connection and Isolation Panel [NHANES]    Frequency of Communication with Friends and Family: Never    Frequency of Social Gatherings with Friends and Family: Once a week    Attends Religious Services: Never    Marine scientist or Organizations: No    Attends Archivist Meetings: Never    Marital Status: Married  Human resources officer Violence: Not At Risk (02/03/2022)   Humiliation, Afraid, Rape, and Kick questionnaire    Fear of Current or Ex-Partner: No    Emotionally Abused: No    Physically Abused: No    Sexually Abused: No    FAMILY HISTORY:  Family History  Problem Relation Age of Onset   Depression Mother    Hearing loss Mother    Hyperlipidemia Mother     Hypertension Mother    Stroke Mother    Early death Father    Diabetes Brother    Pancreatic cancer Paternal Uncle    Colon cancer Neg Hx     CURRENT MEDICATIONS:  Current Outpatient Medications  Medication Sig Dispense Refill   acetaminophen (TYLENOL) 650 MG CR tablet Take 1,300 mg by mouth every 8 (eight) hours as needed for pain.     albuterol (VENTOLIN HFA) 108 (90 Base) MCG/ACT inhaler Inhale 2 puffs into the lungs every 6 (six) hours as needed for wheezing or shortness of breath. 18 g 2   amoxicillin-clavulanate (AUGMENTIN) 875-125 MG tablet Take 1 tablet by mouth every 12 (twelve) hours. 20 tablet 0   apixaban (ELIQUIS) 5 MG TABS  tablet Take 1 tablet (5 mg total) by mouth 2 (two) times daily. 60 tablet 5   Ascorbic Acid (VITAMIN C PO) Take 500 mg by mouth daily.     esomeprazole (NEXIUM) 40 MG capsule Take 1 capsule (40 mg total) by mouth daily. 90 capsule 3   ferrous sulfate 325 (65 FE) MG EC tablet TAKE 1 TABLET(325 MG) BY MOUTH TWICE DAILY 60 tablet 3   flecainide (TAMBOCOR) 100 MG tablet TAKE 1 TABLET(100 MG) BY MOUTH EVERY 12 HOURS 60 tablet 11   fluticasone (FLONASE) 50 MCG/ACT nasal spray Place 2 sprays into both nostrils daily. 16 g 6   furosemide (LASIX) 20 MG tablet Take 1 tablet (20 mg total) by mouth daily. 90 tablet 3   guaiFENesin (MUCINEX) 600 MG 12 hr tablet Take 1 tablet (600 mg total) by mouth 2 (two) times daily as needed. 20 tablet 0   Lactobacillus Rhamnosus, GG, (PROBIOTIC COLIC) LIQD Take 1 tablet by mouth 4 (four) times a week.     metFORMIN (GLUCOPHAGE) 1000 MG tablet Take 1 tablet (1,000 mg total) by mouth 2 (two) times daily with a meal. 60 tablet 5   metoprolol succinate (TOPROL-XL) 100 MG 24 hr tablet Take 1 tablet (100 mg total) by mouth daily. 90 tablet 2   Multiple Vitamin (MULTIVITAMIN) tablet Take 1 tablet by mouth daily.     oxyCODONE-acetaminophen (PERCOCET/ROXICET) 5-325 MG tablet Take 1 tablet by mouth every 8 (eight) hours as needed for severe  pain.     potassium chloride SA (KLOR-CON M) 10 MEQ tablet Take 1 tablet (10 mEq total) by mouth daily. 90 tablet 3   pravastatin (PRAVACHOL) 20 MG tablet TAKE 1 TABLET BY MOUTH 3 TIMES EVERY WEEK 15 tablet 10   promethazine-dextromethorphan (PROMETHAZINE-DM) 6.25-15 MG/5ML syrup Take 5 mLs by mouth 4 (four) times daily as needed. 100 mL 0   Pseudoeph-Doxylamine-DM-APAP (NYQUIL PO) Take by mouth.     vitamin B-12 (CYANOCOBALAMIN) 1000 MCG tablet Take 1,000 mcg by mouth daily.     No current facility-administered medications for this visit.   Facility-Administered Medications Ordered in Other Visits  Medication Dose Route Frequency Provider Last Rate Last Admin   iron sucrose (VENOFER) 200 mg in sodium chloride 0.9 % 100 mL IVPB  200 mg Intravenous Once Pennington, Rebekah M, PA-C        ALLERGIES:  Allergies  Allergen Reactions   Gabapentin Shortness Of Breath   Lyrica [Pregabalin] Shortness Of Breath   Crestor [Rosuvastatin] Other (See Comments)    myalgia   Farxiga [Dapagliflozin]     Developed UTI    PHYSICAL EXAM:  Performance status (ECOG): 1 - Symptomatic but completely ambulatory  Vitals:   05/05/22 1133  BP: 117/87  Pulse: 87  Resp: 17  Temp: 98.8 F (37.1 C)  SpO2: 98%   Wt Readings from Last 3 Encounters:  05/05/22 239 lb 3.2 oz (108.5 kg)  03/23/22 238 lb (108 kg)  03/07/22 239 lb 9.6 oz (108.7 kg)   Physical Exam Vitals reviewed.  Constitutional:      Appearance: Normal appearance. He is obese.  Cardiovascular:     Rate and Rhythm: Normal rate and regular rhythm.     Pulses: Normal pulses.     Heart sounds: Normal heart sounds.  Pulmonary:     Effort: Pulmonary effort is normal.     Breath sounds: Normal breath sounds.  Abdominal:     Palpations: Abdomen is soft. There is no hepatomegaly, splenomegaly or mass.  Tenderness: There is no abdominal tenderness.  Musculoskeletal:     Right lower leg: No edema.     Left lower leg: No edema.   Lymphadenopathy:     Cervical: No cervical adenopathy.     Right cervical: No superficial, deep or posterior cervical adenopathy.    Left cervical: No superficial, deep or posterior cervical adenopathy.     Upper Body:     Right upper body: No supraclavicular or axillary adenopathy.     Left upper body: No supraclavicular or axillary adenopathy.     Lower Body: No right inguinal adenopathy. No left inguinal adenopathy.  Neurological:     General: No focal deficit present.     Mental Status: He is alert and oriented to person, place, and time.  Psychiatric:        Mood and Affect: Mood normal.        Behavior: Behavior normal.      LABORATORY DATA:  I have reviewed the labs as listed.     Latest Ref Rng & Units 05/02/2022    2:19 PM 03/08/2022    8:30 AM 11/16/2021    1:53 AM  CBC  WBC 4.0 - 10.5 K/uL 6.3  6.3  6.1   Hemoglobin 13.0 - 17.0 g/dL 14.3  15.8  13.7   Hematocrit 39.0 - 52.0 % 42.2  45.2  40.3   Platelets 150 - 400 K/uL 141  132  118       Latest Ref Rng & Units 03/08/2022    8:30 AM 11/17/2021    4:20 AM 11/16/2021    1:53 AM  CMP  Glucose 70 - 99 mg/dL 145  188  133   BUN 6 - 24 mg/dL _0 Creatinine 0.76 - 1.27 mg/dL 0.93  0.66  0.66   Sodium 134 - 144 mmol/L 138  139  137   Potassium 3.5 - 5.2 mmol/L 4.4  4.2  3.9   Chloride 96 - 106 mmol/L 98  101  106   CO2 20 - 29 mmol/L _1 Calcium 8.7 - 10.2 mg/dL 9.8  9.2  8.9   Total Protein 6.0 - 8.5 g/dL 7.5     Total Bilirubin 0.0 - 1.2 mg/dL 0.4     Alkaline Phos 44 - 121 IU/L 111     AST 0 - 40 IU/L 24     ALT 0 - 44 IU/L 31       DIAGNOSTIC IMAGING:  I have independently reviewed the scans and discussed with the patient. ECHOCARDIOGRAM COMPLETE  Result Date: 04/18/2022    ECHOCARDIOGRAM REPORT   Patient Name:   SHAWAN CORELLA Date of Exam: 04/18/2022 Medical Rec #:  [id number]        Height:       69.0 in Accession #:    [id number]       Weight:       238.0 lb Date of Birth:  [birth year]         BSA:          2.225 m Patient Age:    68 years         BP:           110/82 mmHg Patient Gender: M                HR:           76 bpm. Exam Location:  [facility]  Procedure: 2D Echo, Cardiac Doppler and Color Doppler Indications:    I42.9 Cardiomyopathy                 I48.91 Atrial Fibrillation  History:        Patient has prior history of Echocardiogram examinations, most                 recent 08/25/2021. Arrythmias:Atrial Fibrillation,                 Signs/Symptoms:Murmur; Risk Factors:Diabetes.  Sonographer:    Cresenciano Lick RDCS Referring Phys: [id number] WILL MARTIN [place]  1. Left ventricular ejection fraction, by estimation, is 55%. The left ventricle has normal function. The left ventricle has no regional wall motion abnormalities. Left ventricular diastolic parameters are consistent with Grade I diastolic dysfunction (impaired relaxation).  2. Right ventricular systolic function is normal. The right ventricular size is mildly enlarged.  3. Left atrial size was moderately dilated.  4. Right atrial size was mildly dilated.  5. The mitral valve is normal in structure. Trivial mitral valve regurgitation.  6. The aortic valve is tricuspid. Aortic valve regurgitation is not visualized. No aortic stenosis is present.  7. Aortic dilatation noted. There is borderline dilatation of the aortic root, measuring 37 mm. There is mild dilatation of the ascending aorta, measuring 40 mm.  8. The inferior vena cava is normal in size with greater than 50% respiratory variability, suggesting right atrial pressure of 3 mmHg. Comparison(s): Compared to prior TTE on 08/2021, the LVEF has improved to 55% (previously 45-50%). FINDINGS  Left Ventricle: Left ventricular ejection fraction, by estimation, is 55%. The left ventricle has normal function. The left ventricle has no regional wall motion abnormalities. The left ventricular internal cavity size was normal in size. There is no left ventricular  hypertrophy. Left ventricular diastolic parameters are consistent with Grade I diastolic dysfunction (impaired relaxation). Right Ventricle: The right ventricular size is mildly enlarged. No increase in right ventricular wall thickness. Right ventricular systolic function is normal. Left Atrium: Left atrial size was moderately dilated. Right Atrium: Right atrial size was mildly dilated. Pericardium: There is no evidence of pericardial effusion. Mitral Valve: The mitral valve is normal in structure. Trivial mitral valve regurgitation. Tricuspid Valve: The tricuspid valve is normal in structure. Tricuspid valve regurgitation is trivial. Aortic Valve: The aortic valve is tricuspid. Aortic valve regurgitation is not visualized. No aortic stenosis is present. Pulmonic Valve: The pulmonic valve was normal in structure. Pulmonic valve regurgitation is trivial. Aorta: Aortic dilatation noted. There is borderline dilatation of the aortic root, measuring 37 mm. There is mild dilatation of the ascending aorta, measuring 40 mm. Venous: The inferior vena cava is normal in size with greater than 50% respiratory variability, suggesting right atrial pressure of 3 mmHg. IAS/Shunts: The atrial septum is grossly normal.  LEFT VENTRICLE PLAX 2D LVIDd:         5.00 cm   Diastology LVIDs:         3.60 cm   LV e' medial:    6.15 cm/s LV PW:         1.10 cm   LV E/e' medial:  12.7 LV IVS:        1.10 cm   LV e' lateral:   8.97 cm/s LVOT diam:     2.30 cm   LV E/e' lateral: 8.7 LV SV:         89 LV SV Index:   40 LVOT Area:  4.15 cm  RIGHT VENTRICLE             IVC RV Basal diam:  4.50 cm     IVC diam: 1.30 cm RV S prime:     15.30 cm/s TAPSE (M-mode): 3.1 cm LEFT ATRIUM             Index        RIGHT ATRIUM           Index LA diam:        4.70 cm 2.11 cm/m   RA Area:     16.30 cm LA Vol (A2C):   61.6 ml 27.69 ml/m  RA Volume:   45.60 ml  20.50 ml/m LA Vol (A4C):   65.1 ml 29.26 ml/m LA Biplane Vol: 65.0 ml 29.22 ml/m  AORTIC  VALVE LVOT Vmax:   100.25 cm/s LVOT Vmean:  68.400 cm/s LVOT VTI:    0.215 m  AORTA Ao Root diam: 3.70 cm Ao Asc diam:  4.00 cm MITRAL VALVE                TRICUSPID VALVE MV Area (PHT): 3.95 cm     TR Peak grad:   32.7 mmHg MV Decel Time: 192 msec     TR Vmax:        286.00 cm/s MV E velocity: 78.10 cm/s MV A velocity: 102.50 cm/s  SHUNTS MV E/A ratio:  0.76         Systemic VTI:  0.22 m                             Systemic Diam: 2.30 cm Gwyndolyn Kaufman MD Electronically signed by Gwyndolyn Kaufman MD Signature Date/Time: 04/18/2022/3:22:38 PM    Final    LONG TERM MONITOR (3-14 DAYS)  Result Date: 04/14/2022 Patch Wear Time:  10 days and 5 hours Predominant rhythm was sinus rhythm 11.3% ventricular ectopy Less than 1% supraventricular ectopy Triggered episodes associated with sinus rhythm and sinus rhythm with PVCs Will Camnitz, MD     ASSESSMENT:  1.  Left parotid mass and lymphadenopathy -Patient has had ongoing left-sided neck lymphadenopathy that waxes and wanes, also associated with axillary and inguinal tenderness but without discernible lymphadenopathy -Persistent fatigue since December 2021, no significant weight loss or other B symptoms -Autoimmune screen (RF, ANA) negative; mildly elevated ESR 28, CRP 1.2; HIV nonreactive, CLL FISH negative; hepatitis panel negative, LDH within normal limits at 170 -Most recent CBC (07/22/2020) within normal limits -CT soft tissue neck (08/04/2020): 17 x 15 x 13 mm mass or lymph node in the inferior aspect of the left parotid gland as well as enlarged level 1 lymph node on the left measuring 15 x 14 x 14 mm; no other asymmetric or pathologically enlarged lymph nodes in neck, but several symmetric lymphadenopathy in neck as described in full radiology report -CT chest (08/04/2020) showed no evidence of thoracic adenopathy or primary malignancy within the chest - Biopsy on 08/17/2020-left parotid nodule consistent with Warthin's tumor.  Left cervical lymph node  biopsy showed scant nodal tissue fragments without morphologic/immunophenotypic evidence of lymphoproliferative disorder.   2.  Iron deficiency anemia secondary to occult GI bleed and malabsorption -Patient was hospitalized in November 2021 due to melena and anemia, transfused RBC x2 -Was seen by GI, suspected occult small bowel bleed related to NSAID use, although no definite source of bleeding was found on EGD or colonoscopy -Hemoglobin within normal limits at  14.5 (07/22/2020) -Iron panel (07/23/2019) shows serum iron 42, percent saturation, ferritin 17 -Likely malabsorption of iron in the setting of concurrent PPI use   3.  Social history -Patient reports every day tobacco use (0.5 to 1 pack/day cigarettes), weekly alcohol use (4-5 beers per week); infrequent illicit drug use (cocaine) -Patient lives at home with his wife, is on disability   PLAN:  1.  Left parotid Warthin's tumor: - CT soft tissue neck on 05/25/2021 showed unchanged 16 x 15 x 18 mm nodule in the inferior aspect of the left parotid gland. - He has follow-up with Dr. Benjamine Mola in January.   2.  Iron deficiency anemia secondary to occult GI bleed - Last Feraheme on 11/22/2021.  He felt better for 2 to 3 weeks after last infusion.  He again feels somewhat tired. - Reviewed labs today which showed ferritin 107 and percent saturation 23.  Hemoglobin is 14.3. - Due to his fatigue, I have recommended Feraheme x 1.  Will check back again in 6 months with repeat labs.  3.  Bilateral neck adenopathy: - CT soft tissue neck on 05/25/2021 with unchanged mildly enlarged bilateral level 2 lymph nodes. - Physical examination did not reveal any significant adenopathy today.  No B symptoms.   Orders placed this encounter:  Orders Placed This Encounter  Procedures   CBC with Differential   Lactate dehydrogenase   Iron and TIBC ([facility] ONLY)   Ferritin      Derek Jack, MD [place] [phone]

## 2022-05-11 ENCOUNTER — Inpatient Hospital Stay: Payer: PPO

## 2022-05-11 VITALS — BP 122/83 | HR 67 | Temp 98.8°F | Resp 18

## 2022-05-11 DIAGNOSIS — D500 Iron deficiency anemia secondary to blood loss (chronic): Secondary | ICD-10-CM

## 2022-05-11 DIAGNOSIS — R591 Generalized enlarged lymph nodes: Secondary | ICD-10-CM | POA: Diagnosis not present

## 2022-05-11 MED ORDER — SODIUM CHLORIDE 0.9 % IV SOLN
510.0000 mg | Freq: Once | INTRAVENOUS | Status: AC
  Administered 2022-05-11: 510 mg via INTRAVENOUS
  Filled 2022-05-11: qty 17

## 2022-05-11 MED ORDER — SODIUM CHLORIDE 0.9 % IV SOLN: INTRAVENOUS | Status: DC

## 2022-05-11 NOTE — Progress Notes (Signed)
Patient reports today for Feraheme, patient reports taking pre-medications at home. Patient tolerated iron infusion with no complaints voiced. Peripheral IV site clean and dry with good blood return noted before and after infusion. Band aid applied. VSS with discharge and left in satisfactory condition with no s/s of distress noted.

## 2022-05-11 NOTE — Patient Instructions (Signed)
[facility]  Discharge Instructions: Thank you for choosing [facility] to provide your oncology and hematology care.  If you have a lab appointment with the Cancer Center, please come in thru the Main Entrance and check in at the main information desk.  Wear comfortable clothing and clothing appropriate for easy access to any Portacath or PICC line.   We strive to give you quality time with your provider. You may need to reschedule your appointment if you arrive late (15 or more minutes).  Arriving late affects you and other patients whose appointments are after yours.  Also, if you miss three or more appointments without notifying the office, you may be dismissed from the clinic at the provider's discretion.      For prescription refill requests, have your pharmacy contact our office and allow 72 hours for refills to be completed.    Today you received the following Feraheme, return as scheduled.   To help prevent nausea and vomiting after your treatment, we encourage you to take your nausea medication as directed.  BELOW ARE SYMPTOMS THAT SHOULD BE REPORTED IMMEDIATELY: *FEVER GREATER THAN 100.4 F (38 C) OR HIGHER *CHILLS OR SWEATING *NAUSEA AND VOMITING THAT IS NOT CONTROLLED WITH YOUR NAUSEA MEDICATION *UNUSUAL SHORTNESS OF BREATH *UNUSUAL BRUISING OR BLEEDING *URINARY PROBLEMS (pain or burning when urinating, or frequent urination) *BOWEL PROBLEMS (unusual diarrhea, constipation, pain near the anus) TENDERNESS IN MOUTH AND THROAT WITH OR WITHOUT PRESENCE OF ULCERS (sore throat, sores in mouth, or a toothache) UNUSUAL RASH, SWELLING OR PAIN  UNUSUAL VAGINAL DISCHARGE OR ITCHING   Items with * indicate a potential emergency and should be followed up as soon as possible or go to the Emergency Department if any problems should occur.  Please show the CHEMOTHERAPY ALERT CARD or IMMUNOTHERAPY ALERT CARD at check-in to the Emergency Department and  triage nurse.  Should you have questions after your visit or need to cancel or reschedule your appointment, please contact [facility] [phone]  and follow the prompts.  Office hours are 8:00 a.m. to 4:30 p.m. Monday - Friday. Please note that voicemails left after 4:00 p.m. may not be returned until the following business day.  We are closed weekends and major holidays. You have access to a nurse at all times for urgent questions. Please call the main number to the clinic [phone] and follow the prompts.  For any non-urgent questions, you may also contact your provider using MyChart. We now offer e-Visits for anyone 18 and older to request care online for non-urgent symptoms. For details visit [email or web address].   Also download the MyChart app! Go to the app store, search "MyChart", open the app, select [facility], and log in with your MyChart username and password.   

## 2022-05-11 NOTE — Progress Notes (Signed)
Patient took own premeds from home.

## 2022-05-20 ENCOUNTER — Other Ambulatory Visit: Payer: Self-pay | Admitting: Internal Medicine

## 2022-05-30 DIAGNOSIS — M1711 Unilateral primary osteoarthritis, right knee: Secondary | ICD-10-CM | POA: Diagnosis not present

## 2022-06-23 ENCOUNTER — Encounter (HOSPITAL_COMMUNITY): Payer: Self-pay | Admitting: *Deleted

## 2022-06-23 ENCOUNTER — Other Ambulatory Visit (HOSPITAL_BASED_OUTPATIENT_CLINIC_OR_DEPARTMENT_OTHER): Payer: Self-pay | Admitting: Family

## 2022-06-24 NOTE — Telephone Encounter (Signed)
Patient of Dr. Gasper Sells. Please review for refill. Thank you!

## 2022-06-28 ENCOUNTER — Telehealth: Payer: Self-pay | Admitting: Internal Medicine

## 2022-06-28 ENCOUNTER — Other Ambulatory Visit: Payer: Self-pay

## 2022-06-28 DIAGNOSIS — E1169 Type 2 diabetes mellitus with other specified complication: Secondary | ICD-10-CM

## 2022-06-28 MED ORDER — METFORMIN HCL 1000 MG PO TABS: 1000.0000 mg | ORAL_TABLET | Freq: Two times a day (BID) | ORAL | 5 refills | Status: DC

## 2022-06-28 NOTE — Telephone Encounter (Signed)
Refills sent to pharmacy. 

## 2022-06-28 NOTE — Telephone Encounter (Signed)
Called needs to change pharmacy to [facility].  metFORMIN (GLUCOPHAGE) 1000 MG tablet [id number]   Pharmacy: [facility]

## 2022-07-11 ENCOUNTER — Ambulatory Visit: Payer: PPO | Admitting: Internal Medicine

## 2022-07-13 ENCOUNTER — Ambulatory Visit (INDEPENDENT_AMBULATORY_CARE_PROVIDER_SITE_OTHER): Payer: PPO | Admitting: Internal Medicine

## 2022-07-13 ENCOUNTER — Encounter: Payer: Self-pay | Admitting: Internal Medicine

## 2022-07-13 VITALS — BP 136/78 | HR 72 | Ht 69.0 in | Wt 243.2 lb

## 2022-07-13 DIAGNOSIS — Z720 Tobacco use: Secondary | ICD-10-CM | POA: Diagnosis not present

## 2022-07-13 DIAGNOSIS — I4819 Other persistent atrial fibrillation: Secondary | ICD-10-CM

## 2022-07-13 DIAGNOSIS — Z122 Encounter for screening for malignant neoplasm of respiratory organs: Secondary | ICD-10-CM | POA: Diagnosis not present

## 2022-07-13 DIAGNOSIS — J449 Chronic obstructive pulmonary disease, unspecified: Secondary | ICD-10-CM

## 2022-07-13 DIAGNOSIS — F320 Major depressive disorder, single episode, mild: Secondary | ICD-10-CM | POA: Insufficient documentation

## 2022-07-13 DIAGNOSIS — E1169 Type 2 diabetes mellitus with other specified complication: Secondary | ICD-10-CM | POA: Diagnosis not present

## 2022-07-13 DIAGNOSIS — E782 Mixed hyperlipidemia: Secondary | ICD-10-CM

## 2022-07-13 LAB — POCT GLYCOSYLATED HEMOGLOBIN (HGB A1C): HbA1c, POC (controlled diabetic range): 7.3 % — AB (ref 0.0–7.0)

## 2022-07-13 MED ORDER — TIRZEPATIDE 2.5 MG/0.5ML ~~LOC~~ SOAJ: 2.5000 mg | SUBCUTANEOUS | 3 refills | Status: DC

## 2022-07-13 NOTE — Assessment & Plan Note (Signed)
Mostly has adjustment issue due to multiple medications and new chronic medical conditions Would avoid adding more medicines for it, he overall feels better compared to prior Advised to engage in activities of liking, especially outdoor activities

## 2022-07-13 NOTE — Assessment & Plan Note (Signed)
Rate controlled with metoprolol now On flecainide 100 mg daily In sinus rhythm now On Eliquis for [facility] Follow-up with cardiology S/p cardioversion

## 2022-07-13 NOTE — Assessment & Plan Note (Signed)
Well-controlled Uses albuterol as needed for dyspnea or wheezing, does not need it daily If frequent use needed, will add maintenance inhaler for COPD

## 2022-07-13 NOTE — Assessment & Plan Note (Signed)
On Pravastatin QOD now Did not tolerate Crestor

## 2022-07-13 NOTE — Assessment & Plan Note (Addendum)
Lab Results  Component Value Date   HGBA1C 7.2 (H) 03/08/2022   Uncontrolled Associated with HTN, HLD and obesity On Metformin 1000 mg twice daily Added Mounjaro for additional weight loss benefit Advised to follow diabetic diet F/u CMP and lipid panel On statin and ARB now Diabetic eye exam: Advised to follow up with Ophthalmology for diabetic eye exam

## 2022-07-13 NOTE — Progress Notes (Signed)
Established Patient Office Visit  Subjective:  Patient ID: Stephen Roberts, male    DOB: [birth year]  Age: 57 y.o. MRN: [id number]  CC:  Chief Complaint  Patient presents with   Diabetes    Four month follow up for diabetes and hypertension     HPI Stephen Roberts is a 57 y.o. male with past medical history of atrial fibrillation,left parotid mass, IDA, type II DM, HLD, chronic neck pain and tobacco abuse who presents for f/u of his chronic medical conditions.  Type II DM: He takes metformin 1000 mg twice daily.  His HbA1c was 7.3 now.  Does not check blood glucose regularly.  Denies any polyuria or polyphagia currently. He admits that he needs to improve his diet.  Atrial fibrillation: He is in sinus rhythm now with flecainide.  He takes metoprolol for rate control and Eliquis for [facility].  Followed by cardiology.  He reports intermittent palpitations, for which he takes additional dose of metoprolol, which improves his symptoms.   COPD: He has mild, intermittent dyspnea, for which he uses albuterol inhaler.  Denies any wheezing currently. Denies any fever or chills currently.  He is still smokes about 0.3 pack/day and is trying to quit.  MDD: He reports apathy, fatigue, increased appetite and sleep disturbance - sometimes hypersomnolence.  He reports that he is tired of his medicines, but states that he overall feels better compared to prior.  He denies any SI or HI currently.   He reports chronic neck pain, has history of cervical spinal fusion surgery for cervical  radiculopathy.  He takes Percocet as needed for severe neck pain.    Past Medical History:  Diagnosis Date   A-fib (HCC)    Arthritis    Diabetes (HCC)    GERD (gastroesophageal reflux disease)    Heart murmur    IDA (iron deficiency anemia)    Pneumonia 06/27/2006    Past Surgical History:  Procedure Laterality Date   ANTERIOR FUSION CERVICAL SPINE     CARDIOVERSION N/A 10/05/2021   Procedure: CARDIOVERSION;   Surgeon: Sanda Klein, MD;  Location: [place];  Service: Cardiovascular;  Laterality: N/A;   CARDIOVERSION N/A 11/18/2021   Procedure: CARDIOVERSION;  Surgeon: Berniece Salines, DO;  Location: [place];  Service: Cardiovascular;  Laterality: N/A;   COLONOSCOPY WITH PROPOFOL N/A 03/26/2020   Procedure: COLONOSCOPY WITH PROPOFOL;  Surgeon: Daneil Dolin, MD;  [facility];  Service: Endoscopy;  Laterality: N/A;   disc in neck Left 05/2017   ESOPHAGEAL DILATION N/A 03/25/2020   Procedure: ESOPHAGEAL DILATION;  Surgeon: Rogene Houston, MD;  [facility];  Service: Endoscopy;  Laterality: N/A;   ESOPHAGOGASTRODUODENOSCOPY (EGD) WITH PROPOFOL N/A 03/25/2020   Procedure: ESOPHAGOGASTRODUODENOSCOPY (EGD) WITH PROPOFOL;  Surgeon: Rogene Houston, MD;  [facility];  Service: Endoscopy;  Laterality: N/A;   GIVENS CAPSULE STUDY N/A 04/13/2020   Procedure: GIVENS CAPSULE STUDY;  Surgeon: Daneil Dolin, MD;  [facility];  Service: Endoscopy;  Laterality: N/A;  7:30am   INSERTION OF MESH N/A 01/23/2013   Procedure: INSERTION OF MESH;  Surgeon: Adin Hector, MD;  Location: [facility];  Service: General;  Laterality: N/A;   LEFT HEART CATH AND CORONARY ANGIOGRAPHY N/A 08/27/2021   Procedure: LEFT HEART CATH AND CORONARY ANGIOGRAPHY;  Surgeon: Martinique, Peter M, MD;  Location: [place] [facility];  Service: Cardiovascular;  Laterality: N/A;   POLYPECTOMY  03/26/2020   Procedure: POLYPECTOMY;  Surgeon:  Rourk, Cristopher Estimable, MD;  [facility];  Service: Endoscopy;;   TONSILLECTOMY     as child   VENTRAL HERNIA REPAIR N/A 01/23/2013   Procedure: LAPAROSCOPIC MULTIPLE VENTRAL HERNIAS;  Surgeon: Adin Hector, MD;  Location: [facility];  Service: General;  Laterality: N/A;    Family History  Problem Relation Age of Onset   Depression Mother    Hearing loss Mother    Hyperlipidemia Mother    Hypertension Mother    Stroke Mother    Early death Father     Diabetes Brother    Pancreatic cancer Paternal Uncle    Colon cancer Neg Hx     Social History   Socioeconomic History   Marital status: Married    Spouse name: Not on file   Number of children: Not on file   Years of education: Not on file   Highest education level: Not on file  Occupational History   Not on file  Tobacco Use   Smoking status: Every Day    Packs/day: 0.50    Years: 42.00    Total pack years: 21.00    Types: Cigarettes    Start date: 61   Smokeless tobacco: Never  Vaping Use   Vaping Use: Never used  Substance and Sexual Activity   Alcohol use: Yes    Alcohol/week: 4.0 standard drinks of alcohol    Types: 4 Cans of beer per week    Comment: moderately   Drug use: Not Currently    Types: Cocaine    Comment: states he has tried it all. Last used cocaine a few months ago.    Sexual activity: Yes  Other Topics Concern   Not on file  Social History Narrative   Not on file   Social Determinants of Health   Financial Resource Strain: Low Risk  (02/03/2022)   Overall Financial Resource Strain (CARDIA)    Difficulty of Paying Living Expenses: Not hard at all  Food Insecurity: No Food Insecurity (02/03/2022)   Hunger Vital Sign    Worried About Running Out of Food in the Last Year: Never true    Ran Out of Food in the Last Year: Never true  Transportation Needs: No Transportation Needs (02/03/2022)   PRAPARE - Hydrologist (Medical): No    Lack of Transportation (Non-Medical): No  Physical Activity: Insufficiently Active (02/03/2022)   Exercise Vital Sign    Days of Exercise per Week: 5 days    Minutes of Exercise per Session: 20 min  Stress: No Stress Concern Present (02/03/2022)   [place]    Feeling of Stress : Only a little  Social Connections: Socially Isolated (02/03/2022)   Social Connection and Isolation Panel [NHANES]    Frequency of Communication  with Friends and Family: Never    Frequency of Social Gatherings with Friends and Family: Once a week    Attends Religious Services: Never    Marine scientist or Organizations: No    Attends Archivist Meetings: Never    Marital Status: Married  Human resources officer Violence: Not At Risk (02/03/2022)   Humiliation, Afraid, Rape, and Kick questionnaire    Fear of Current or Ex-Partner: No    Emotionally Abused: No    Physically Abused: No    Sexually Abused: No    Outpatient Medications Prior to Visit  Medication Sig Dispense Refill   acetaminophen (TYLENOL) 650 MG  CR tablet Take 1,300 mg by mouth every 8 (eight) hours as needed for pain.     albuterol (VENTOLIN HFA) 108 (90 Base) MCG/ACT inhaler Inhale 2 puffs into the lungs every 6 (six) hours as needed for wheezing or shortness of breath. 18 g 2   apixaban (ELIQUIS) 5 MG TABS tablet Take 1 tablet (5 mg total) by mouth 2 (two) times daily. 60 tablet 5   Ascorbic Acid (VITAMIN C PO) Take 500 mg by mouth daily.     esomeprazole (NEXIUM) 40 MG capsule Take 1 capsule (40 mg total) by mouth daily. 90 capsule 3   ferrous sulfate 325 (65 FE) MG EC tablet TAKE 1 TABLET(325 MG) BY MOUTH TWICE DAILY 60 tablet 3   flecainide (TAMBOCOR) 100 MG tablet TAKE 1 TABLET(100 MG) BY MOUTH EVERY 12 HOURS 60 tablet 11   fluticasone (FLONASE) 50 MCG/ACT nasal spray Place 2 sprays into both nostrils daily. 16 g 6   furosemide (LASIX) 20 MG tablet Take 1 tablet (20 mg total) by mouth daily. 90 tablet 3   guaiFENesin (MUCINEX) 600 MG 12 hr tablet Take 1 tablet (600 mg total) by mouth 2 (two) times daily as needed. 20 tablet 0   Lactobacillus Rhamnosus, GG, (PROBIOTIC COLIC) LIQD Take 1 tablet by mouth 4 (four) times a week.     metFORMIN (GLUCOPHAGE) 1000 MG tablet Take 1 tablet (1,000 mg total) by mouth 2 (two) times daily with a meal. 60 tablet 5   metoprolol succinate (TOPROL-XL) 100 MG 24 hr tablet Take 1 tablet (100 mg total) by mouth daily. 90  tablet 2   Multiple Vitamin (MULTIVITAMIN) tablet Take 1 tablet by mouth daily.     oxyCODONE-acetaminophen (PERCOCET/ROXICET) 5-325 MG tablet Take 1 tablet by mouth every 8 (eight) hours as needed for severe pain.     potassium chloride (KLOR-CON M) 10 MEQ tablet Take 1 tablet by mouth once daily 90 tablet 3   pravastatin (PRAVACHOL) 20 MG tablet TAKE 1 TABLET BY MOUTH 3 TIMES EVERY WEEK 15 tablet 10   promethazine-dextromethorphan (PROMETHAZINE-DM) 6.25-15 MG/5ML syrup Take 5 mLs by mouth 4 (four) times daily as needed. 100 mL 0   Pseudoeph-Doxylamine-DM-APAP (NYQUIL PO) Take by mouth.     vitamin B-12 (CYANOCOBALAMIN) 1000 MCG tablet Take 1,000 mcg by mouth daily.     amoxicillin-clavulanate (AUGMENTIN) 875-125 MG tablet Take 1 tablet by mouth every 12 (twelve) hours. 20 tablet 0   Facility-Administered Medications Prior to Visit  Medication Dose Route Frequency Provider Last Rate Last Admin   iron sucrose (VENOFER) 200 mg in sodium chloride 0.9 % 100 mL IVPB  200 mg Intravenous Once Pennington, Rebekah M, PA-C        Allergies  Allergen Reactions   Gabapentin Shortness Of Breath   Lyrica [Pregabalin] Shortness Of Breath   Crestor [Rosuvastatin] Other (See Comments)    myalgia   Farxiga [Dapagliflozin]     Developed UTI    ROS Review of Systems  Constitutional:  Negative for chills and fever.  HENT:  Negative for congestion, sinus pressure, sinus pain and sore throat.   Eyes:  Negative for pain and discharge.  Respiratory:  Positive for shortness of breath. Negative for cough.   Cardiovascular:  Positive for palpitations. Negative for chest pain.  Gastrointestinal:  Negative for diarrhea, nausea and vomiting.  Endocrine: Negative for polydipsia and polyuria.  Genitourinary:  Negative for dysuria and hematuria.  Musculoskeletal:  Positive for back pain and neck pain. Negative for neck stiffness.  Skin:  Negative for rash.  Neurological:  Negative for dizziness, weakness and  numbness.  Psychiatric/Behavioral:  Negative for agitation and behavioral problems.       Objective:    Physical Exam Vitals reviewed.  Constitutional:      General: He is not in acute distress.    Appearance: He is not diaphoretic.  HENT:     Head: Normocephalic and atraumatic.     Nose: No congestion.     Mouth/Throat:     Mouth: Mucous membranes are moist.  Eyes:     General: No scleral icterus.    Extraocular Movements: Extraocular movements intact.  Neck:     Vascular: No carotid bruit.  Cardiovascular:     Rate and Rhythm: Normal rate and regular rhythm.     Pulses: Normal pulses.     Heart sounds: Normal heart sounds. No murmur heard. Pulmonary:     Breath sounds: Normal breath sounds. No wheezing or rales.  Musculoskeletal:     Cervical back: Neck supple. No tenderness.     Right lower leg: No edema.     Left lower leg: No edema.  Skin:    General: Skin is warm.     Findings: No rash.  Neurological:     General: No focal deficit present.     Mental Status: He is alert and oriented to person, place, and time.     Sensory: No sensory deficit.     Motor: No weakness.  Psychiatric:        Mood and Affect: Mood normal.        Behavior: Behavior normal.     BP 136/78 (BP Location: Right Arm, Patient Position: Sitting, Cuff Size: Large)   Pulse 72   Ht '5\' 9"'$  (1.753 m)   Wt 243 lb 3.2 oz (110.3 kg)   SpO2 95%   BMI 35.91 kg/m  Wt Readings from Last 3 Encounters:  07/13/22 243 lb 3.2 oz (110.3 kg)  05/05/22 239 lb 3.2 oz (108.5 kg)  03/23/22 238 lb (108 kg)    Lab Results  Component Value Date   TSH 1.910 03/08/2022   Lab Results  Component Value Date   WBC 6.3 05/02/2022   HGB 14.3 05/02/2022   HCT 42.2 05/02/2022   MCV 102.2 (H) 05/02/2022   PLT 141 (L) 05/02/2022   Lab Results  Component Value Date   NA 138 03/08/2022   K 4.4 03/08/2022   CO2 21 03/08/2022   GLUCOSE 145 (H) 03/08/2022   BUN 20 03/08/2022   CREATININE 0.93 03/08/2022    BILITOT 0.4 03/08/2022   ALKPHOS 111 03/08/2022   AST 24 03/08/2022   ALT 31 03/08/2022   PROT 7.5 03/08/2022   ALBUMIN 4.7 03/08/2022   CALCIUM 9.8 03/08/2022   ANIONGAP 15 11/17/2021   EGFR 95 03/08/2022   Lab Results  Component Value Date   CHOL 178 03/08/2022   Lab Results  Component Value Date   HDL 36 (L) 03/08/2022   Lab Results  Component Value Date   LDLCALC 116 (H) 03/08/2022   Lab Results  Component Value Date   TRIG 147 03/08/2022   Lab Results  Component Value Date   CHOLHDL 4.9 03/08/2022   Lab Results  Component Value Date   HGBA1C 7.3 (A) 07/13/2022      Assessment & Plan:   Problem List Items Addressed This Visit       Cardiovascular and Mediastinum   Persistent atrial fibrillation ([place])  Rate controlled with metoprolol now On flecainide 100 mg daily In sinus rhythm now On Eliquis for [facility] Follow-up with cardiology S/p cardioversion        Respiratory   Chronic obstructive pulmonary disease (HCC)    Well-controlled Uses albuterol as needed for dyspnea or wheezing, does not need it daily If frequent use needed, will add maintenance inhaler for COPD        Endocrine   Type 2 diabetes mellitus with other specified complication ([place]) - Primary    Lab Results  Component Value Date   HGBA1C 7.2 (H) 03/08/2022  Uncontrolled Associated with HTN, HLD and obesity On Metformin 1000 mg twice daily Added Mounjaro for additional weight loss benefit Advised to follow diabetic diet F/u CMP and lipid panel On statin and ARB now Diabetic eye exam: Advised to follow up with Ophthalmology for diabetic eye exam      Relevant Medications   tirzepatide (MOUNJARO) 2.5 MG/0.5ML Pen   Other Relevant Orders   POCT glycosylated hemoglobin (Hb A1C) (Completed)     Other   Tobacco abuse    Smokes about 0.3 pack/day  Asked about quitting: confirms that he/she currently smokes cigarettes Advise to quit smoking: Educated about QUITTING to reduce  the risk of cancer, cardio and cerebrovascular disease. Assess willingness: Unwilling to quit at this time, but is working on cutting back. Assist with counseling and pharmacotherapy: Counseled for 5 minutes and literature provided. Arrange for follow up: follow up in 3 months and continue to offer help.      Hyperlipidemia    On Pravastatin QOD now Did not tolerate Crestor      Current mild episode of major depressive disorder without prior episode ([place])    Mostly has adjustment issue due to multiple medications and new chronic medical conditions Would avoid adding more medicines for it, he overall feels better compared to prior Advised to engage in activities of liking, especially outdoor activities      Screening for lung cancer    Has > 20-pack-year smoking history Ordered low-dose CT chest after discussing with the patient.       Relevant Orders   CT CHEST LUNG CANCER SCREENING LOW DOSE WO CONTRAST    Meds ordered this encounter  Medications   tirzepatide (MOUNJARO) 2.5 MG/0.5ML Pen    Sig: Inject 2.5 mg into the skin once a week.    Dispense:  2 mL    Refill:  3    Follow-up: Return in about 4 months (around 11/11/2022) for DM.    Lindell Spar, MD

## 2022-07-13 NOTE — Assessment & Plan Note (Signed)
Smokes about 0.3 pack/day  Asked about quitting: confirms that he/she currently smokes cigarettes Advise to quit smoking: Educated about QUITTING to reduce the risk of cancer, cardio and cerebrovascular disease. Assess willingness: Unwilling to quit at this time, but is working on cutting back. Assist with counseling and pharmacotherapy: Counseled for 5 minutes and literature provided. Arrange for follow up: follow up in 3 months and continue to offer help.

## 2022-07-13 NOTE — Assessment & Plan Note (Signed)
Has > 20-pack-year smoking history Ordered low-dose CT chest after discussing with the patient.

## 2022-07-13 NOTE — Patient Instructions (Signed)
Please start taking Mounjaro as prescribed.  Please continue taking other medications as prescribed.  Please continue to follow low carb diet and perform moderate exercise/walking at least 150 mins/week.

## 2022-08-21 ENCOUNTER — Other Ambulatory Visit: Payer: Self-pay

## 2022-08-21 ENCOUNTER — Ambulatory Visit
Admission: RE | Admit: 2022-08-21 | Discharge: 2022-08-21 | Disposition: A | Discharge status: Home / Self Care | Payer: PPO | Source: Ambulatory Visit | Attending: Emergency Medicine | Admitting: Emergency Medicine

## 2022-08-21 VITALS — BP 130/97 | HR 75 | Temp 98.8°F | Resp 20

## 2022-08-21 DIAGNOSIS — J029 Acute pharyngitis, unspecified: Secondary | ICD-10-CM

## 2022-08-21 DIAGNOSIS — J069 Acute upper respiratory infection, unspecified: Secondary | ICD-10-CM | POA: Diagnosis not present

## 2022-08-21 LAB — POCT RAPID STREP A (OFFICE): Rapid Strep A Screen: NEGATIVE

## 2022-08-21 MED ORDER — AMOXICILLIN 500 MG PO CAPS: 500.0000 mg | ORAL_CAPSULE | Freq: Two times a day (BID) | ORAL | 0 refills | Status: AC

## 2022-08-21 NOTE — ED Triage Notes (Signed)
Pt reports sore throat and right ear pain on Thursday. Pt reports family member had leftover antibiotic and took 3 doses of medication and reports symptoms went away but returned yesterday.

## 2022-08-21 NOTE — ED Provider Notes (Signed)
[facility]    CSN: [id number] Arrival date & time: 08/21/22  1328      History   Chief Complaint Chief Complaint  Patient presents with   Sore Throat    Entered by patient    HPI Stephen Roberts is a 57 y.o. male.   Patient presents for evaluation of nasal congestion, rhinorrhea, worsened nonproductive cough, right-sided ear pain and right-sided pain to the throat beginning 5 days ago.  Attempted use of an old prescription of amoxicillin for 3 days before he ran out, symptoms did resolve but returned 2 days later with worsening pain to the throat.  Has been able to tolerate food and liquids.  No known sick contacts prior.  Additionally has attempted use of Tylenol and Motrin.  Denies fever.  Daily tobacco use.   Past Medical History:  Diagnosis Date   A-fib    Arthritis    Diabetes    GERD (gastroesophageal reflux disease)    Heart murmur    IDA (iron deficiency anemia)    Pneumonia 06/27/2006    Patient Active Problem List   Diagnosis Date Noted   Current mild episode of major depressive disorder without prior episode 07/13/2022   Screening for lung cancer 07/13/2022   Encounter for general adult medical examination with abnormal findings 03/07/2022   Hiatal hernia 01/12/2022   Persistent atrial fibrillation    Hospital discharge follow-up 09/01/2021   Atrial fibrillation with RVR  08/24/2021   Essential hypertension 08/24/2021   Iron deficiency anemia 08/24/2021   S/P cervical spinal fusion 06/30/2021   Chronic obstructive pulmonary disease 06/30/2021   GERD (gastroesophageal reflux disease) 06/30/2021   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   Recurrent sinus infections 05/04/2020   Iron deficiency anemia due to chronic blood loss    Generalized weakness 03/24/2020   Dysphagia    Hyperlipidemia 02/13/2019   Type 2 diabetes mellitus with other specified complication 03/14/2018   Cervical disc disorder with radiculopathy, high cervical region  05/30/2017   Chronic pain syndrome 11/10/2014   Allergic rhinitis 07/30/2014   Allergic dermatitis 07/30/2014   Prostate cancer screening 07/30/2014   Incarcerated epigastric hernia 01/11/2013   Incarcerated umbilical hernia 01/11/2013   Tobacco abuse 01/11/2013    Past Surgical History:  Procedure Laterality Date   ANTERIOR FUSION CERVICAL SPINE     CARDIOVERSION N/A 10/05/2021   Procedure: CARDIOVERSION;  Surgeon: Thurmon Fair, MD;  [facility];  Service: Cardiovascular;  Laterality: N/A;   CARDIOVERSION N/A 11/18/2021   Procedure: CARDIOVERSION;  Surgeon: Thomasene Ripple, DO;  Location: [facility];  Service: Cardiovascular;  Laterality: N/A;   COLONOSCOPY WITH PROPOFOL N/A 03/26/2020   Procedure: COLONOSCOPY WITH PROPOFOL;  Surgeon: Corbin Ade, MD;  [facility];  Service: Endoscopy;  Laterality: N/A;   disc in neck Left 05/2017   ESOPHAGEAL DILATION N/A 03/25/2020   Procedure: ESOPHAGEAL DILATION;  Surgeon: Malissa Hippo, MD;  [facility];  Service: Endoscopy;  Laterality: N/A;   ESOPHAGOGASTRODUODENOSCOPY (EGD) WITH PROPOFOL N/A 03/25/2020   Procedure: ESOPHAGOGASTRODUODENOSCOPY (EGD) WITH PROPOFOL;  Surgeon: Malissa Hippo, MD;  [facility];  Service: Endoscopy;  Laterality: N/A;   GIVENS CAPSULE STUDY N/A 04/13/2020   Procedure: GIVENS CAPSULE STUDY;  Surgeon: Corbin Ade, MD;  [facility];  Service: Endoscopy;  Laterality: N/A;  7:30am   INSERTION OF MESH N/A 01/23/2013   Procedure: INSERTION OF MESH;  Surgeon: Ernestene Mention, MD;  Location:  [facility];  Service: General;  Laterality: N/A;   LEFT HEART CATH AND CORONARY ANGIOGRAPHY N/A 08/27/2021   Procedure: LEFT HEART CATH AND CORONARY ANGIOGRAPHY;  Surgeon: Swazil[place], Peter M, MD;  Location: [facility] INVASIVE CV LAB;  Service: Cardiovascular;  Laterality: N/A;   POLYPECTOMY  03/26/2020   Procedure: POLYPECTOMY;  Surgeon: Corbin Ade, MD;  [facility];   Service: Endoscopy;;   TONSILLECTOMY     as child   VENTRAL HERNIA REPAIR N/A 01/23/2013   Procedure: LAPAROSCOPIC MULTIPLE VENTRAL HERNIAS;  Surgeon: Ernestene Mention, MD;  Location: [facility];  Service: General;  Laterality: N/A;       Home Medications    Prior to Admission medications   Medication Sig Start Date End Date Taking? Authorizing Provider  acetaminophen (TYLENOL) 650 MG CR tablet Take 1,300 mg by mouth every 8 (eight) hours as needed for pain.    [provider]  albuterol (VENTOLIN HFA) 108 (90 Base) MCG/ACT inhaler Inhale 2 puffs into the lungs every 6 (six) hours as needed for wheezing or shortness of breath. 03/07/22   Anabel Halon, MD  apixaban (ELIQUIS) 5 MG TABS tablet Take 1 tablet (5 mg total) by mouth 2 (two) times daily. 04/01/22   Camnitz, Andree Coss, MD  Ascorbic Acid (VITAMIN C PO) Take 500 mg by mouth daily.    [provider]  esomeprazole (NEXIUM) 40 MG capsule Take 1 capsule (40 mg total) by mouth daily. 09/01/21   Anabel Halon, MD  ferrous sulfate 325 (65 FE) MG EC tablet TAKE 1 TABLET(325 MG) BY MOUTH TWICE DAILY 05/20/22   Anabel Halon, MD  flecainide (TAMBOCOR) 100 MG tablet TAKE 1 TABLET(100 MG) BY MOUTH EVERY 12 HOURS 02/16/22   Camnitz, Will Daphine Deutscher, MD  fluticasone (FLONASE) 50 MCG/ACT nasal spray Place 2 sprays into both nostrils daily. 03/07/22   Anabel Halon, MD  furosemide (LASIX) 20 MG tablet Take 1 tablet (20 mg total) by mouth daily. 09/16/21 09/11/22  Alver Sorrow, NP  guaiFENesin (MUCINEX) 600 MG 12 hr tablet Take 1 tablet (600 mg total) by mouth 2 (two) times daily as needed. 03/17/22   Particia Nearing, PA-C  Lactobacillus Rhamnosus, GG, (PROBIOTIC COLIC) LIQD Take 1 tablet by mouth 4 (four) times a week.    [provider]  metFORMIN (GLUCOPHAGE) 1000 MG tablet Take 1 tablet (1,000 mg total) by mouth 2 (two) times daily with a meal. 06/28/22   Allena Katz, Earlie Lou, MD  metoprolol succinate (TOPROL-XL) 100 MG  24 hr tablet Take 1 tablet (100 mg total) by mouth daily. 03/23/22   Camnitz, Andree Coss, MD  Multiple Vitamin (MULTIVITAMIN) tablet Take 1 tablet by mouth daily.    [provider]  oxyCODONE-acetaminophen (PERCOCET/ROXICET) 5-325 MG tablet Take 1 tablet by mouth every 8 (eight) hours as needed for severe pain.    [provider]  potassium chloride (KLOR-CON M) 10 MEQ tablet Take 1 tablet by mouth once daily 06/24/22   Camnitz, Andree Coss, MD  pravastatin (PRAVACHOL) 20 MG tablet TAKE 1 TABLET BY MOUTH 3 TIMES EVERY WEEK 01/04/22   Chandrasekhar, Mahesh A, MD  promethazine-dextromethorphan (PROMETHAZINE-DM) 6.25-15 MG/5ML syrup Take 5 mLs by mouth 4 (four) times daily as needed. 03/17/22   Particia Nearing, PA-C  Pseudoeph-Doxylamine-DM-APAP (NYQUIL PO) Take by mouth.    [provider]  tirzepatide Greggory Keen) 2.5 MG/0.5ML Pen Inject 2.5 mg into the skin once a week. 07/13/22   Anabel Halon,  MD  vitamin B-12 (CYANOCOBALAMIN) 1000 MCG tablet Take 1,000 mcg by mouth daily.    [provider]    Family History Family History  Problem Relation Age of Onset   Depression Mother    Hearing loss Mother    Hyperlipidemia Mother    Hypertension Mother    Stroke Mother    Early death Father    Diabetes Brother    Pancreatic cancer Paternal Uncle    Colon cancer Neg Hx     Social History Social History   Tobacco Use   Smoking status: Every Day    Packs/day: 0.50    Years: 42.00    Additional pack years: 0.00    Total pack years: 21.00    Types: Cigarettes    Start date: 1980   Smokeless tobacco: Never  Vaping Use   Vaping Use: Never used  Substance Use Topics   Alcohol use: Yes    Alcohol/week: 4.0 standard drinks of alcohol    Types: 4 Cans of beer per week    Comment: moderately   Drug use: Not Currently    Types: Cocaine    Comment: states he has tried it all. Last used cocaine a few months ago.      Allergies   Gabapentin, Lyrica  [pregabalin], Crestor [rosuvastatin], and Farxiga [dapagliflozin]   Review of Systems Review of Systems  Constitutional: Negative.   HENT:  Positive for congestion, ear pain, rhinorrhea and sore throat. Negative for dental problem, drooling, ear discharge, facial swelling, hearing loss, mouth sores, nosebleeds, postnasal drip, sinus pressure, sinus pain, sneezing, tinnitus and voice change.   Respiratory:  Positive for cough. Negative for apnea, choking, chest tightness, shortness of breath, wheezing and stridor.   Cardiovascular: Negative.   Gastrointestinal: Negative.   Skin: Negative.   Neurological: Negative.      Physical Exam Triage Vital Signs ED Triage Vitals  Enc Vitals Group     BP 08/21/22 1340 (!) 130/97     Pulse Rate 08/21/22 1340 75     Resp 08/21/22 1340 20     Temp 08/21/22 1340 98.8 F (37.1 C)     Temp Source 08/21/22 1340 Oral     SpO2 08/21/22 1340 94 %     Weight --      Height --      Head Circumference --      Peak Flow --      Pain Score 08/21/22 1338 7     Pain Loc --      Pain Edu? --      Excl. in GC? --    No data found.  Updated Vital Signs BP (!) 130/97 (BP Location: Right Arm)   Pulse 75   Temp 98.8 F (37.1 C) (Oral)   Resp 20   SpO2 94%   Visual Acuity Right Eye Distance:   Left Eye Distance:   Bilateral Distance:    Right Eye Near:   Left Eye Near:    Bilateral Near:     Physical Exam Constitutional:      Appearance: Normal appearance. He is well-developed.  HENT:     Head: Normocephalic.     Right Ear: Tympanic membrane and ear canal normal.     Left Ear: Tympanic membrane and ear canal normal.     Nose: Congestion present. No rhinorrhea.     Mouth/Throat:     Mouth: Mucous membranes are dry.     Pharynx: Posterior oropharyngeal erythema present.  Tonsils: No tonsillar exudate. 0 on the right. 0 on the left.  Cardiovascular:     Rate and Rhythm: Normal rate and regular rhythm.     Heart sounds: Normal heart  sounds.  Musculoskeletal:     Cervical back: Normal range of motion.  Lymphadenopathy:     Cervical: Cervical adenopathy present.  Neurological:     Mental Status: He is alert.      UC Treatments / Results  Labs (all labs ordered are listed, but only abnormal results are displayed) Labs Reviewed  POCT RAPID STREP A (OFFICE)    EKG   Radiology No results found.  Procedures Procedures (including critical care time)  Medications Ordered in UC Medications - No data to display  Initial Impression / Assessment and Plan / UC Course  I have reviewed the triage vital signs and the nursing notes.  Pertinent labs & imaging results that were available during my care of the patient were reviewed by me and considered in my medical decision making (see chart for details).  Acute upper respiratory infection, sore throat  Rapid strep test is negative however due to significant erythema to the oropharynx we will provide bacterial coverage, amoxicillin prescribed, discussed administration, recommended supportive measures, may follow-up with urgent care as needed Final Clinical Impressions(s) / UC Diagnoses   Final diagnoses:  None   Discharge Instructions   None    ED Prescriptions   None    PDMP not reviewed this encounter.   Valinda Hoar, NP 08/21/22 1358

## 2022-08-21 NOTE — Discharge Instructions (Signed)
Strep testing is negative however due to the significant redness to your throat will provide coverage for additional bacteria's which may be causing your symptoms  Take amoxicillin twice a day for the next 10 days, daily will see improvement in about 48 hours and steady progression from there  To be use of salt gargles throat lozenges, warm liquids, teaspoons of honey and over-the-counter clippers septic spray for comfort  May give Tylenol or Motrin every 6 hours as needed for additional comfort  You may follow-up at urgent care as needed

## 2022-09-23 ENCOUNTER — Ambulatory Visit: Payer: PPO | Attending: Cardiology | Admitting: Cardiology

## 2022-09-23 ENCOUNTER — Encounter: Payer: Self-pay | Admitting: Cardiology

## 2022-09-23 VITALS — BP 134/80 | HR 75 | Ht 69.0 in | Wt 234.0 lb

## 2022-09-23 DIAGNOSIS — I4819 Other persistent atrial fibrillation: Secondary | ICD-10-CM | POA: Diagnosis not present

## 2022-09-23 DIAGNOSIS — D6869 Other thrombophilia: Secondary | ICD-10-CM | POA: Diagnosis not present

## 2022-09-23 DIAGNOSIS — I493 Ventricular premature depolarization: Secondary | ICD-10-CM

## 2022-09-23 NOTE — Progress Notes (Signed)
Electrophysiology Office Note   Date:  09/23/2022   ID:  Stephen Roberts, DOB [birth year], MRN [id number]  PCP:  Anabel Halon, MD  Cardiologist:  Izora Ribas Primary Electrophysiologist:  Alexande Sheerin Jorja Loa, MD   Chief Complaint: PVC, AF   History of Present Illness: Stephen Roberts is a 57 y.o. male who is being seen today for the evaluation of PVC at the request of Anabel Halon, MD. Presenting today for electrophysiology evaluation.  He has a history significant for atrial fibrillation and diabetes.  He was also noted to have an elevated PVC burden.  He is now on flecainide.  He has a apnea but was unable to tolerate CPAP.  He is being referred for inspire.  He continues to smoke and continues to binge drink alcohol.  Today, denies symptoms of palpitations, chest pain, shortness of breath, orthopnea, PND, lower extremity edema, claudication, dizziness, presyncope, syncope, bleeding, or neurologic sequela. The patient is tolerating medications without difficulties.  Since being seen he has done well.  He has no chest pain or shortness of breath.  He continues to do all of his daily activities.  He feels better today than he has in the last few months.    Past Medical History:  Diagnosis Date   A-fib (HCC)    Arthritis    Diabetes (HCC)    GERD (gastroesophageal reflux disease)    Heart murmur    IDA (iron deficiency anemia)    Pneumonia 06/27/2006   Past Surgical History:  Procedure Laterality Date   ANTERIOR FUSION CERVICAL SPINE     CARDIOVERSION N/A 10/05/2021   Procedure: CARDIOVERSION;  Surgeon: Thurmon Fair, MD;  [facility]: [facility];  Service: Cardiovascular;  Laterality: N/A;   CARDIOVERSION N/A 11/18/2021   Procedure: CARDIOVERSION;  Surgeon: Thomasene Ripple, DO;  Location: [facility];  Service: Cardiovascular;  Laterality: N/A;   COLONOSCOPY WITH PROPOFOL N/A 03/26/2020   Procedure: COLONOSCOPY WITH PROPOFOL;  Surgeon: Corbin Ade, MD;  [facility];  Service: Endoscopy;  Laterality: N/A;   disc in neck Left 05/2017   ESOPHAGEAL DILATION N/A 03/25/2020   Procedure: ESOPHAGEAL DILATION;  Surgeon: Malissa Hippo, MD;  [facility];  Service: Endoscopy;  Laterality: N/A;   ESOPHAGOGASTRODUODENOSCOPY (EGD) WITH PROPOFOL N/A 03/25/2020   Procedure: ESOPHAGOGASTRODUODENOSCOPY (EGD) WITH PROPOFOL;  Surgeon: Malissa Hippo, MD;  [facility];  Service: Endoscopy;  Laterality: N/A;   GIVENS CAPSULE STUDY N/A 04/13/2020   Procedure: GIVENS CAPSULE STUDY;  Surgeon: Corbin Ade, MD;  [facility];  Service: Endoscopy;  Laterality: N/A;  7:30am   INSERTION OF MESH N/A 01/23/2013   Procedure: INSERTION OF MESH;  Surgeon: Ernestene Mention, MD;  Location: [facility];  Service: General;  Laterality: N/A;   LEFT HEART CATH AND CORONARY ANGIOGRAPHY N/A 08/27/2021   Procedure: LEFT HEART CATH AND CORONARY ANGIOGRAPHY;  Surgeon: Swaziland, Peter M, MD;  Location: [facility] INVASIVE CV LAB;  Service: Cardiovascular;  Laterality: N/A;   POLYPECTOMY  03/26/2020   Procedure: POLYPECTOMY;  Surgeon: Corbin Ade, MD;  [facility];  Service: Endoscopy;;   TONSILLECTOMY     as child   VENTRAL HERNIA REPAIR N/A 01/23/2013   Procedure: LAPAROSCOPIC MULTIPLE VENTRAL HERNIAS;  Surgeon: Ernestene Mention, MD;  Location: [facility];  Service: General;  Laterality: N/A;     Current Outpatient Medications  Medication Sig Dispense Refill   acetaminophen (TYLENOL) 650 MG CR tablet Take  1,300 mg by mouth every 8 (eight) hours as needed for pain.     albuterol (VENTOLIN HFA) 108 (90 Base) MCG/ACT inhaler Inhale 2 puffs into the lungs every 6 (six) hours as needed for wheezing or shortness of breath. 18 g 2   apixaban (ELIQUIS) 5 MG TABS tablet Take 1 tablet (5 mg total) by mouth 2 (two) times daily. 60 tablet 5   esomeprazole (NEXIUM) 40 MG capsule Take 1 capsule (40 mg total) by mouth daily. 90 capsule 3   ferrous sulfate 325 (65  FE) MG EC tablet TAKE 1 TABLET(325 MG) BY MOUTH TWICE DAILY 60 tablet 3   flecainide (TAMBOCOR) 100 MG tablet TAKE 1 TABLET(100 MG) BY MOUTH EVERY 12 HOURS 60 tablet 11   fluticasone (FLONASE) 50 MCG/ACT nasal spray Place 2 sprays into both nostrils daily. 16 g 6   Lactobacillus Rhamnosus, GG, (PROBIOTIC COLIC) LIQD Take 1 tablet by mouth 4 (four) times a week.     metFORMIN (GLUCOPHAGE) 1000 MG tablet Take 1 tablet (1,000 mg total) by mouth 2 (two) times daily with a meal. 60 tablet 5   metoprolol succinate (TOPROL-XL) 100 MG 24 hr tablet Take 1 tablet (100 mg total) by mouth daily. 90 tablet 2   Multiple Vitamin (MULTIVITAMIN) tablet Take 1 tablet by mouth daily.     potassium chloride (KLOR-CON M) 10 MEQ tablet Take 1 tablet by mouth once daily 90 tablet 3   pravastatin (PRAVACHOL) 20 MG tablet TAKE 1 TABLET BY MOUTH 3 TIMES EVERY WEEK 15 tablet 10   tirzepatide (MOUNJARO) 2.5 MG/0.5ML Pen Inject 2.5 mg into the skin once a week. 2 mL 3   vitamin B-12 (CYANOCOBALAMIN) 1000 MCG tablet Take 1,000 mcg by mouth daily.     Ascorbic Acid (VITAMIN C PO) Take 500 mg by mouth daily. (Patient not taking: Reported on 09/23/2022)     furosemide (LASIX) 20 MG tablet Take 1 tablet (20 mg total) by mouth daily. 90 tablet 3   guaiFENesin (MUCINEX) 600 MG 12 hr tablet Take 1 tablet (600 mg total) by mouth 2 (two) times daily as needed. (Patient not taking: Reported on 09/23/2022) 20 tablet 0   oxyCODONE-acetaminophen (PERCOCET/ROXICET) 5-325 MG tablet Take 1 tablet by mouth every 8 (eight) hours as needed for severe pain. (Patient not taking: Reported on 09/23/2022)     promethazine-dextromethorphan (PROMETHAZINE-DM) 6.25-15 MG/5ML syrup Take 5 mLs by mouth 4 (four) times daily as needed. (Patient not taking: Reported on 09/23/2022) 100 mL 0   Pseudoeph-Doxylamine-DM-APAP (NYQUIL PO) Take by mouth. (Patient not taking: Reported on 09/23/2022)     No current facility-administered medications for this visit.    Facility-Administered Medications Ordered in Other Visits  Medication Dose Route Frequency Provider Last Rate Last Admin   iron sucrose (VENOFER) 200 mg in sodium chloride 0.9 % 100 mL IVPB  200 mg Intravenous Once Pennington, Rebekah M, PA-C        Allergies:   Gabapentin, Lyrica [pregabalin], Crestor [rosuvastatin], and Farxiga [dapagliflozin]   Social History:  The patient  reports that he has been smoking cigarettes. He started smoking about 44 years ago. He has a 21.00 pack-year smoking history. He has never used smokeless tobacco. He reports current alcohol use of about 4.0 standard drinks of alcohol per week. He reports that he does not currently use drugs after having used the following drugs: Cocaine.   Family History:  The patient's family history includes Depression in his mother; Diabetes in his brother; Early death in  his father; Hearing loss in his mother; Hyperlipidemia in his mother; Hypertension in his mother; Pancreatic cancer in his paternal uncle; Stroke in his mother.   ROS:  Please see the history of present illness.   Otherwise, review of systems is positive for none.   All other systems are reviewed and negative.   PHYSICAL EXAM: VS:  BP 134/80   Pulse 75   Ht 5\' 9"  (1.753 m)   Wt 234 lb (106.1 kg)   SpO2 96%   BMI 34.56 kg/m  , BMI Body mass index is 34.56 kg/m. GEN: Well nourished, well developed, in no acute distress  HEENT: normal  Neck: no JVD, carotid bruits, or masses Cardiac: RRR; no murmurs, rubs, or gallops,no edema  Respiratory:  clear to auscultation bilaterally, normal work of breathing GI: soft, nontender, nondistended, + BS MS: no deformity or atrophy  Skin: warm and dry Neuro:  Strength and sensation are intact Psych: euthymic mood, full affect  EKG:  EKG is ordered today. Personal review of the ekg ordered shows sinus rhythm, PVCs   Recent Labs: 09/28/2021: BNP 210.8 11/16/2021: Magnesium 1.8 03/08/2022: ALT 31; BUN 20; Creatinine, Ser  0.93; Potassium 4.4; Sodium 138; TSH 1.910 05/02/2022: Hemoglobin 14.3; Platelets 141    Lipid Panel     Component Value Date/Time   CHOL 178 03/08/2022 0830   TRIG 147 03/08/2022 0830   HDL 36 (L) 03/08/2022 0830   CHOLHDL 4.9 03/08/2022 0830   CHOLHDL 4.2 02/13/2019 1258   VLDL 11 08/31/2016 0935   LDLCALC 116 (H) 03/08/2022 0830   LDLCALC 97 02/13/2019 1258     Wt Readings from Last 3 Encounters:  09/23/22 234 lb (106.1 kg)  07/13/22 243 lb 3.2 oz (110.3 kg)  05/05/22 239 lb 3.2 oz (108.5 kg)      Other studies Reviewed: Additional studies/ records that were reviewed today include: echo, recent hospitalization notes, [facility] [place] note    ASSESSMENT AND PLAN:  1.  PVCs: Elevated burden at 18% previously.  Wore cardiac monitor with an 11% burden on flecainide.  He is feeling well and is unaware of PVCs.  Kaesha Kirsch continue with current management.  2.  Persistent atrial fibrillation: CHA2DS2-VASc of 3.  Currently on Eliquis and flecainide.  No further episodes.  Continue with current management.  3.  Secondary hypercoagulable state: Currently on Eliquis for atrial fibrillation  4.  Chronic systolic heart failure: No obvious volume overload.  Repeat echo with normalized ejection fraction.  5.  Obstructive sleep apnea: Stafford [place] arrange for him to sleep sleep medicine due to CPAP noncompliance.   The patient does not have concerns regarding his medicines.  The following changes were made today:  none  Labs/ tests ordered today include: Orders Placed This Encounter  Procedures   EKG 12-Lead     Disposition:   FU 6 month  Signed, Jeff Frieden Jorja Loa, MD  09/23/2022 11:02 AM     [facility] [name] [place] [address] [phone] (office) [phone] (fax)  I have seen and examined this patient with Sherie Don.  Agree with above, note added to reflect my findings.  To clinic today feeling well.  He has a history of PVCs, atrial  fibrillation, and mild systolic heart failure.  He states that since he left the hospital, he has done much better with quite a few less PVCs and has noted no atrial fibrillation.  His respiratory status is improved.  He does continue to complain of trouble  with his CPAP, feeling like he does not sleep well at night.  Aside from that he has no major complaints.  GEN: Well nourished, well developed, in no acute distress  HEENT: normal  Neck: no JVD, carotid bruits, or masses Cardiac: RRR; no murmurs, rubs, or gallops,no edema  Respiratory:  clear to auscultation bilaterally, normal work of breathing GI: soft, nontender, nondistended, + BS MS: no deformity or atrophy  Skin: warm and dry Neuro:  Strength and sensation are intact Psych: euthymic mood, full affect   PVCs: Had an elevated burden at around 18% previously.  His ejection fraction was mildly reduced.  He has been started on flecainide.  He feels much improved on this.  We Alexx Giambra order a 7-day monitor to determine his PVC burden since being on flecainide.  If his PVC burden is low, Jasan Doughtie likely need a repeat echo to ensure his ejection fraction is improved. Persistent atrial fibrillation: CHA2DS2-VASc of 3.  Currently on Eliquis and flecainide as above.  He has had no further episodes.  We Aikam Hellickson continue with current management. Secondary hypercoagulable state: Currently on Eliquis for atrial fibrillation as above Chronic systolic heart failure: No obvious volume overload.  We Laurice Iglesia repeat echo now that his PVCs appear to be suppressed. Obstructive sleep apnea: Has been unable to tolerate CPAP.  His sleeping has been quite poor.  We Collene Massimino send a message to sleep medicine.  Lillyanna Glandon M. Archana Eckman MD 09/23/2022 11:02 AM

## 2022-09-30 ENCOUNTER — Telehealth (HOSPITAL_BASED_OUTPATIENT_CLINIC_OR_DEPARTMENT_OTHER): Payer: Self-pay

## 2022-09-30 MED ORDER — FUROSEMIDE 20 MG PO TABS: 20.0000 mg | ORAL_TABLET | Freq: Every day | ORAL | 3 refills | Status: DC

## 2022-09-30 NOTE — Telephone Encounter (Signed)
Received fax from [facility] in [place] requesting refills for Lasix 20 mg.   Routing to [facility] refill pool.

## 2022-09-30 NOTE — Addendum Note (Signed)
Addended by: Eden Lathe on: 09/30/2022 10:32 AM   Modules accepted: Orders

## 2022-09-30 NOTE — Telephone Encounter (Signed)
RX now sent to pt's preferred pharmacy.

## 2022-10-12 ENCOUNTER — Other Ambulatory Visit: Payer: Self-pay

## 2022-10-12 DIAGNOSIS — Z87891 Personal history of nicotine dependence: Secondary | ICD-10-CM

## 2022-10-12 DIAGNOSIS — F17210 Nicotine dependence, cigarettes, uncomplicated: Secondary | ICD-10-CM

## 2022-10-22 ENCOUNTER — Other Ambulatory Visit: Payer: Self-pay | Admitting: Internal Medicine

## 2022-10-27 ENCOUNTER — Inpatient Hospital Stay: Payer: PPO | Attending: Physician Assistant

## 2022-10-27 DIAGNOSIS — K921 Melena: Secondary | ICD-10-CM | POA: Insufficient documentation

## 2022-10-27 DIAGNOSIS — Z800 Family history of malignant neoplasm of digestive organs: Secondary | ICD-10-CM | POA: Insufficient documentation

## 2022-10-27 DIAGNOSIS — Z9089 Acquired absence of other organs: Secondary | ICD-10-CM | POA: Diagnosis not present

## 2022-10-27 DIAGNOSIS — R0602 Shortness of breath: Secondary | ICD-10-CM | POA: Diagnosis not present

## 2022-10-27 DIAGNOSIS — E669 Obesity, unspecified: Secondary | ICD-10-CM | POA: Insufficient documentation

## 2022-10-27 DIAGNOSIS — Z8349 Family history of other endocrine, nutritional and metabolic diseases: Secondary | ICD-10-CM | POA: Diagnosis not present

## 2022-10-27 DIAGNOSIS — Z7901 Long term (current) use of anticoagulants: Secondary | ICD-10-CM | POA: Diagnosis not present

## 2022-10-27 DIAGNOSIS — Z7289 Other problems related to lifestyle: Secondary | ICD-10-CM | POA: Diagnosis not present

## 2022-10-27 DIAGNOSIS — R059 Cough, unspecified: Secondary | ICD-10-CM | POA: Diagnosis not present

## 2022-10-27 DIAGNOSIS — Z79899 Other long term (current) drug therapy: Secondary | ICD-10-CM | POA: Diagnosis not present

## 2022-10-27 DIAGNOSIS — E119 Type 2 diabetes mellitus without complications: Secondary | ICD-10-CM | POA: Insufficient documentation

## 2022-10-27 DIAGNOSIS — Z818 Family history of other mental and behavioral disorders: Secondary | ICD-10-CM | POA: Diagnosis not present

## 2022-10-27 DIAGNOSIS — R002 Palpitations: Secondary | ICD-10-CM | POA: Diagnosis not present

## 2022-10-27 DIAGNOSIS — M549 Dorsalgia, unspecified: Secondary | ICD-10-CM | POA: Diagnosis not present

## 2022-10-27 DIAGNOSIS — D110 Benign neoplasm of parotid gland: Secondary | ICD-10-CM | POA: Diagnosis not present

## 2022-10-27 DIAGNOSIS — R0789 Other chest pain: Secondary | ICD-10-CM | POA: Diagnosis not present

## 2022-10-27 DIAGNOSIS — R519 Headache, unspecified: Secondary | ICD-10-CM | POA: Insufficient documentation

## 2022-10-27 DIAGNOSIS — R5383 Other fatigue: Secondary | ICD-10-CM | POA: Insufficient documentation

## 2022-10-27 DIAGNOSIS — R1110 Vomiting, unspecified: Secondary | ICD-10-CM | POA: Diagnosis not present

## 2022-10-27 DIAGNOSIS — M542 Cervicalgia: Secondary | ICD-10-CM | POA: Insufficient documentation

## 2022-10-27 DIAGNOSIS — I4891 Unspecified atrial fibrillation: Secondary | ICD-10-CM | POA: Diagnosis not present

## 2022-10-27 DIAGNOSIS — R0609 Other forms of dyspnea: Secondary | ICD-10-CM | POA: Diagnosis not present

## 2022-10-27 DIAGNOSIS — D500 Iron deficiency anemia secondary to blood loss (chronic): Secondary | ICD-10-CM | POA: Insufficient documentation

## 2022-10-27 DIAGNOSIS — Z823 Family history of stroke: Secondary | ICD-10-CM | POA: Insufficient documentation

## 2022-10-27 DIAGNOSIS — R590 Localized enlarged lymph nodes: Secondary | ICD-10-CM | POA: Diagnosis not present

## 2022-10-27 DIAGNOSIS — Z822 Family history of deafness and hearing loss: Secondary | ICD-10-CM | POA: Insufficient documentation

## 2022-10-27 DIAGNOSIS — F17210 Nicotine dependence, cigarettes, uncomplicated: Secondary | ICD-10-CM | POA: Insufficient documentation

## 2022-10-27 LAB — IRON AND TIBC
Iron: 149 ug/dL (ref 45–182)
Saturation Ratios: 39 % (ref 17.9–39.5)
TIBC: 382 ug/dL (ref 250–450)
UIBC: 233 ug/dL

## 2022-10-27 LAB — CBC WITH DIFFERENTIAL/PLATELET
Abs Immature Granulocytes: 0.03 10*3/uL (ref 0.00–0.07)
Basophils Absolute: 0.1 10*3/uL (ref 0.0–0.1)
Basophils Relative: 1 %
Eosinophils Absolute: 0.3 10*3/uL (ref 0.0–0.5)
Eosinophils Relative: 4 %
HCT: 46.4 % (ref 39.0–52.0)
Hemoglobin: 15.3 g/dL (ref 13.0–17.0)
Immature Granulocytes: 0 %
Lymphocytes Relative: 19 %
Lymphs Abs: 1.5 10*3/uL (ref 0.7–4.0)
MCH: 33.6 pg (ref 26.0–34.0)
MCHC: 33 g/dL (ref 30.0–36.0)
MCV: 102 fL — ABNORMAL HIGH (ref 80.0–100.0)
Monocytes Absolute: 0.7 10*3/uL (ref 0.1–1.0)
Monocytes Relative: 9 %
Neutro Abs: 5.2 10*3/uL (ref 1.7–7.7)
Neutrophils Relative %: 67 %
Platelets: 148 10*3/uL — ABNORMAL LOW (ref 150–400)
RBC: 4.55 MIL/uL (ref 4.22–5.81)
RDW: 13.9 % (ref 11.5–15.5)
WBC: 7.8 10*3/uL (ref 4.0–10.5)
nRBC: 0 % (ref 0.0–0.2)

## 2022-10-27 LAB — LACTATE DEHYDROGENASE: LDH: 148 U/L (ref 98–192)

## 2022-10-27 LAB — FERRITIN: Ferritin: 31 ng/mL (ref 24–336)

## 2022-11-02 ENCOUNTER — Ambulatory Visit (INDEPENDENT_AMBULATORY_CARE_PROVIDER_SITE_OTHER): Payer: PPO | Admitting: Acute Care

## 2022-11-02 ENCOUNTER — Encounter: Payer: Self-pay | Admitting: Acute Care

## 2022-11-02 DIAGNOSIS — F17210 Nicotine dependence, cigarettes, uncomplicated: Secondary | ICD-10-CM

## 2022-11-02 NOTE — Progress Notes (Signed)
Virtual Visit via Telephone Note  I connected with Stephen Roberts on 11/02/22 at  9:30 AM EDT by telephone and verified that I am speaking with the correct person using two identifiers.  Location: Patient:  At home Provider:  [address], [place], [place], [address]    I discussed the limitations, risks, security and privacy concerns of performing an evaluation and management service by telephone and the availability of in person appointments. I also discussed with the patient that there may be a patient responsible charge related to this service. The patient expressed understanding and agreed to proceed.   Shared Decision Making Visit Lung Cancer Screening Program [phone])   Eligibility: Age 57 y.o. Pack Years Smoking History Calculation  34 pack year smoking history (# packs/per year x # years smoked) Recent History of coughing up blood  no Unexplained weight loss? no ( >Than 15 pounds within the last 6 months ) Prior History Lung / other cancer no (Diagnosis within the last 5 years already requiring surveillance chest CT Scans). Smoking Status Current Smoker Former Smokers: Years since quit:  NA  Quit Date:  NA  Visit Components: Discussion included one or more decision making aids. yes Discussion included risk/benefits of screening. yes Discussion included potential follow up diagnostic testing for abnormal scans. yes Discussion included meaning and risk of over diagnosis. yes Discussion included meaning and risk of False Positives. yes Discussion included meaning of total radiation exposure. yes  Counseling Included: Importance of adherence to annual lung cancer LDCT screening. yes Impact of comorbidities on ability to participate in the program. yes Ability and willingness to under diagnostic treatment. yes  Smoking Cessation Counseling: Current Smokers:  Discussed importance of smoking cessation. yes Information about tobacco cessation classes and  interventions provided to patient. yes Patient provided with "ticket" for LDCT Scan. yes Symptomatic Patient. no  Counseling NA Diagnosis Code: Tobacco Use Z72.0 Asymptomatic Patient yes  Counseling (Intermediate counseling: > three minutes counseling) U0454 Former Smokers:  Discussed the importance of maintaining cigarette abstinence. yes Diagnosis Code: Personal History of Nicotine Dependence. U98.119 Information about tobacco cessation classes and interventions provided to patient. Yes Patient provided with "ticket" for LDCT Scan. yes Written Order for Lung Cancer Screening with LDCT placed in Epic. Yes (CT Chest Lung Cancer Screening Low Dose W/O CM) [id number] Z12.2-Screening of respiratory organs Z87.891-Personal history of nicotine dependence  I have spent 25 minutes of face to face/ virtual visit   time with  Stephen Roberts discussing the risks and benefits of lung cancer screening. We viewed / discussed a power point together that explained in detail the above noted topics. We paused at intervals to allow for questions to be asked and answered to ensure understanding.We discussed that the single most powerful action that he can take to decrease his risk of developing lung cancer is to quit smoking. We discussed whether or not he is ready to commit to setting a quit date. We discussed options for tools to aid in quitting smoking including nicotine replacement therapy, non-nicotine medications, support groups, Quit Smart classes, and behavior modification. We discussed that often times setting smaller, more achievable goals, such as eliminating 1 cigarette a day for a week and then 2 cigarettes a day for a week can be helpful in slowly decreasing the number of cigarettes smoked. This allows for a sense of accomplishment as well as providing a clinical benefit. I provided  him  with smoking cessation  information  with contact information for community  resources, classes, free nicotine replacement  therapy, and access to mobile apps, text messaging, and on-line smoking cessation help. I have also provided  him  the office contact information in the event he needs to contact me, or the screening staff. We discussed the time and location of the scan, and that either Abigail Miyamoto RN, Karlton Lemon, RN  or I will call / send a letter with the results within 24-72 hours of receiving them. The patient verbalized understanding of all of  the above and had no further questions upon leaving the office. They have my contact information in the event they have any further questions.  I spent 3 minutes counseling on smoking cessation and the health risks of continued tobacco abuse.  I explained to the patient that there has been a high incidence of coronary artery disease noted on these exams. I explained that this is a non-gated exam therefore degree or severity cannot be determined. This patient is on statin therapy. I have asked the patient to follow-up with their PCP regarding any incidental finding of coronary artery disease and management with diet or medication as their PCP  feels is clinically indicated. The patient verbalized understanding of the above and had no further questions upon completion of the visit.   Can go long periods of time without a cigarette. Thinks his smoking drive is more habit related.   Bevelyn Ngo, NP 11/02/2022

## 2022-11-02 NOTE — Patient Instructions (Signed)
Thank you for participating in the [facility]. It was our pleasure to meet you today. We will call you with the results of your scan within the next few days. Your scan will be assigned a Lung RADS category score by the physicians reading the scans.  This Lung RADS score determines follow up scanning.  See below for description of categories, and follow up screening recommendations. We will be in touch to schedule your follow up screening annually or based on recommendations of our providers. We will fax a copy of your scan results to your Primary Care Physician, or the physician who referred you to the program, to ensure they have the results. Please call the office if you have any questions or concerns regarding your scanning experience or results.  Our office number is [phone]. Please speak with Denise Phelps, RN. , or  Denise Buckner RN, They are  our Lung Cancer Screening RN.'s If They are unavailable when you call, Please leave a message on the voice mail. We will return your call at our earliest convenience.This voice mail is monitored several times a day.  Remember, if your scan is normal, we will scan you annually as long as you continue to meet the criteria for the program. (Age 50-80, Current smoker or smoker who has quit within the last 15 years). If you are a smoker, remember, quitting is the single most powerful action that you can take to decrease your risk of lung cancer and other pulmonary, breathing related problems. We know quitting is hard, and we are here to help.  Please let us know if there is anything we can do to help you meet your goal of quitting. If you are a former smoker, congratulations. We are proud of you! Remain smoke free! Remember you can refer friends or family members through the number above.  We will screen them to make sure they meet criteria for the program. Thank you for helping us take better care of you by  participating in Lung Screening.  You can receive free nicotine replacement therapy ( patches, gum or mints) by calling 1-800-QUIT NOW. Please call so we can get you on the path to becoming  a non-smoker. I know it is hard, but you can do this!  Lung RADS Categories:  Lung RADS 1: no nodules or definitely non-concerning nodules.  Recommendation is for a repeat annual scan in 12 months.  Lung RADS 2:  nodules that are non-concerning in appearance and behavior with a very low likelihood of becoming an active cancer. Recommendation is for a repeat annual scan in 12 months.  Lung RADS 3: nodules that are probably non-concerning , includes nodules with a low likelihood of becoming an active cancer.  Recommendation is for a 6-month repeat screening scan. Often noted after an upper respiratory illness. We will be in touch to make sure you have no questions, and to schedule your 6-month scan.  Lung RADS 4 A: nodules with concerning findings, recommendation is most often for a follow up scan in 3 months or additional testing based on our provider's assessment of the scan. We will be in touch to make sure you have no questions and to schedule the recommended 3 month follow up scan.  Lung RADS 4 B:  indicates findings that are concerning. We will be in touch with you to schedule additional diagnostic testing based on our provider's  assessment of the scan.  Other options for assistance in smoking cessation (   As covered by your insurance benefits)  Hypnosis for smoking cessation  [employer] [phone]  Acupuncture for smoking cessation  [facility] [phone]   

## 2022-11-02 NOTE — Progress Notes (Unsigned)
[facility] [address], [place] [address]   CLINIC:  Medical Oncology/Hematology  PCP:  Anabel Halon, MD [address] [place] [address] [phone]   REASON FOR VISIT:  Follow-up for iron deficiency anemia + left parotid Warthin's tumor  CURRENT THERAPY: Intermittent IV iron  INTERVAL HISTORY:   [name] 57 y.o. male returns for routine follow-up of iron deficiency anemia and left parotid Warthin's tumor.  He was last seen by Dr. Ellin Saba on 05/05/2022.  He received Feraheme on 05/11/2022.  At today's visit, he reports feeling fair.  No recent hospitalizations, surgeries, or changes in baseline health status.  He has had intermittent melena and coffee-ground emesis for the past 2 years.  He had not had any episodes for the past 6 months, but reports that about 3 days ago he had coffee-ground stools and dark-colored vomit.  This was preceded by 2 days of heavy NSAID use and burning epigastric pain.  He reports that his stool is starting to clear up again.  He reports severe fatigue, palpitations, headaches, and chronic dyspnea on exertion.  He denies any chest pain, pica, lightheadedness, or syncope.  He continues to follow with ENT for his left parotid Warthin's tumor, which is reportedly stable.  He denies any new lymphadenopathy or B symptoms.  He continues to smoke daily, but has significantly cut back to <0.5 PPD cigarettes.  He has little to no energy and 100% appetite. He endorses that he is maintaining a stable weight.   ASSESSMENT & PLAN:  1.  Iron deficiency anemia secondary to occult GI bleed - Patient was hospitalized in November 2021 due to melena and anemia, transfused RBC x2 - Gastroenterology suspected occult small bowel bleed related to NSAID use, although no definite source of bleeding was found on EGD or colonoscopy - May also have some malabsorption of iron in the setting of concurrent PPI use - Requires intermittent IV  iron, with most recent Feraheme on 05/11/2022 (given per Dr. Ellin Saba due to fatigue, although ferritin was 107 and percent saturation 23%) - Intermittent episodes of melanotic stool and coffee-ground emesis preceded by epigastric pain and heavy NSAID use - Most recent labs (10/27/2022): Hgb 15.3/MCV 102.0, ferritin 31, iron saturation 39%. - PLAN: Recommend IV Feraheme x 2 due to symptomatic iron deficiency. - Patient instructed to COMPLETELY STOP all NSAID use. - Instructed to reach out to GI regarding his melena and coffee-ground emesis.  Educated on red flag symptoms that should prompt immediate ED evaluation. - We will check labs only in 3 months (CBC, ferritin, iron/TIBC) - We will check labs and see him for office visit in 6 months  2.  Left parotid Warthin's tumor - Initially presented in 2022 with left-sided lymphadenopathy - CT soft tissue neck (08/04/2020): 17 x 15 x 13 mm mass or lymph node in the inferior aspect of the left parotid gland as well as enlarged level 1 lymph node on the left measuring 15 x 14 x 14 mm; no other asymmetric or pathologically enlarged lymph nodes in neck, but several symmetric lymphadenopathy in neck as described in full radiology report -CT chest (08/04/2020) showed no evidence of thoracic adenopathy or primary malignancy within the chest - Biopsy on 08/17/2020: Left parotid nodule consistent with Warthin's tumor. - CT soft tissue neck on 05/25/2021 showed unchanged 16 x 15 x 18 mm nodule in the inferior aspect of the left parotid gland. - PLAN: Continue follow-up with Dr. Suszanne Conners (ENT)  3.  Bilateral neck adenopathy - Presented in 2022 with waxing and waning cervical lymphadenopathy - Laboratory workup was negative (March 2022): Autoimmune screen (RF, ANA) negative; mildly elevated ESR 28, CRP 1.2; HIV nonreactive, CLL FISH negative; hepatitis panel negative, LDH within normal limits at 170 - CT soft tissue neck (08/04/2020): 17 x 15 x 13 mm mass or lymph node in  the inferior aspect of the left parotid gland as well as enlarged level 1 lymph node on the left measuring 15 x 14 x 14 mm; no other asymmetric or pathologically enlarged lymph nodes in neck, but several symmetric lymphadenopathy in neck as described in full radiology report - CT chest (08/04/2020) showed no evidence of thoracic adenopathy or primary malignancy within the chest - Biopsy on 08/17/2020: Left cervical lymph node biopsy showed scant nodal tissue fragments without morphologic/immunophenotypic evidence of lymphoproliferative disorder. - CT soft tissue neck on 05/25/2021 with unchanged mildly enlarged bilateral level 2 lymph nodes. - No B symptoms  - He denies any new lymphadenopathy - Physical examination did not reveal any significant adenopathy today. - Most recent labs 10/27/2022): Normal WBC/differential, normal LDH.   4.  Social history - Patient reports every day tobacco use (0.5 to 1 pack/day cigarettes), weekly alcohol use (4-5 beers per week); infrequent illicit drug use (cocaine) - Patient lives at home with his wife, is on disability  PLAN SUMMARY: >> IV Feraheme x 2 >> Labs in 3 months = CBC, ferritin, iron/TIBC (no office visit at this time - will watch for results and call patient if iron infusion is needed) >> Labs in 6 months = CBC/D, ferritin, iron/TIBC, CMP, LDH >> OFFICE visit in 6 months      REVIEW OF SYSTEMS:   Review of Systems  Constitutional:  Positive for fatigue. Negative for appetite change, chills, diaphoresis, fever and unexpected weight change.  HENT:   Negative for lump/mass and nosebleeds.   Eyes:  Negative for eye problems.  Respiratory:  Positive for chest tightness, cough and shortness of breath. Negative for hemoptysis.   Cardiovascular:  Positive for palpitations. Negative for chest pain and leg swelling.  Gastrointestinal:  Positive for blood in stool and nausea. Negative for abdominal pain, constipation, diarrhea and vomiting.  Genitourinary:   Negative for hematuria.   Musculoskeletal:  Positive for back pain and neck pain.  Skin: Negative.   Neurological:  Positive for extremity weakness, headaches and numbness. Negative for dizziness and light-headedness.  Hematological:  Does not bruise/bleed easily.     PHYSICAL EXAM:  ECOG PERFORMANCE STATUS: 1 - Symptomatic but completely ambulatory  There were no vitals filed for this visit. There were no vitals filed for this visit. Physical Exam Constitutional:      Appearance: Normal appearance. He is obese.  Cardiovascular:     Heart sounds: Normal heart sounds.  Pulmonary:     Breath sounds: Wheezing and rhonchi present.  Lymphadenopathy:     Cervical: No cervical adenopathy.     Upper Body:     Right upper body: No supraclavicular, axillary, pectoral or epitrochlear adenopathy.     Left upper body: No supraclavicular, axillary, pectoral or epitrochlear adenopathy.  Neurological:     General: No focal deficit present.     Mental Status: Mental status is at baseline.  Psychiatric:        Behavior: Behavior normal. Behavior is cooperative.     PAST MEDICAL/SURGICAL HISTORY:  Past Medical History:  Diagnosis Date   A-fib [facility])    Arthritis  Diabetes (HCC)    GERD (gastroesophageal reflux disease)    Heart murmur    IDA (iron deficiency anemia)    Pneumonia 06/27/2006   Past Surgical History:  Procedure Laterality Date   ANTERIOR FUSION CERVICAL SPINE     CARDIOVERSION N/A 10/05/2021   Procedure: CARDIOVERSION;  Surgeon: Thurmon Fair, MD;  Location: [facility] END[facility];  Service: Cardiovascular;  Laterality: N/A;   CARDIOVERSION N/A 11/18/2021   Procedure: CARDIOVERSION;  Surgeon: Thomasene Ripple, DO;  Location: [facility] ENDOSCOPY;  Service: Cardiovascular;  Laterality: N/A;   COLONOSCOPY WITH PROPOFOL N/A 03/26/2020   Procedure: COLONOSCOPY WITH PROPOFOL;  Surgeon: Corbin Ade, MD;  Location: [facility];  Service: Endoscopy;  Laterality: N/A;   disc in neck Left  05/2017   ESOPHAGEAL DILATION N/A 03/25/2020   Procedure: ESOPHAGEAL DILATION;  Surgeon: Malissa Hippo, MD;  Location: [facility];  Service: Endoscopy;  Laterality: N/A;   ESOPHAGOGASTRODUODENOSCOPY (EGD) WITH PROPOFOL N/A 03/25/2020   Procedure: ESOPHAGOGASTRODUODENOSCOPY (EGD) WITH PROPOFOL;  Surgeon: Malissa Hippo, MD;  Location: [facility];  Service: Endoscopy;  Laterality: N/A;   GIVENS CAPSULE STUDY N/A 04/13/2020   Procedure: GIVENS CAPSULE STUDY;  Surgeon: Corbin Ade, MD;  Location: [facility];  Service: Endoscopy;  Laterality: N/A;  7:30am   INSERTION OF MESH N/A 01/23/2013   Procedure: INSERTION OF MESH;  Surgeon: Ernestene Mention, MD;  Location: [facility];  Service: General;  Laterality: N/A;   LEFT HEART CATH AND CORONARY ANGIOGRAPHY N/A 08/27/2021   Procedure: LEFT HEART CATH AND CORONARY ANGIOGRAPHY;  Surgeon: Swazil[place], Peter M, MD;  Location: [facility] INVASIVE CV LAB;  Service: Cardiovascular;  Laterality: N/A;   POLYPECTOMY  03/26/2020   Procedure: POLYPECTOMY;  Surgeon: Corbin Ade, MD;  Location: [facility];  Service: Endoscopy;;   TONSILLECTOMY     as child   VENTRAL HERNIA REPAIR N/A 01/23/2013   Procedure: LAPAROSCOPIC MULTIPLE VENTRAL HERNIAS;  Surgeon: Ernestene Mention, MD;  Location: [facility];  Service: General;  Laterality: N/A;    SOCIAL HISTORY:  Social History   Socioeconomic History   Marital status: Married    Spouse name: Not on file   Number of children: Not on file   Years of education: Not on file   Highest education level: Not on file  Occupational History   Not on file  Tobacco Use   Smoking status: Every Day    Packs/day: 0.75    Years: 45.00    Additional pack years: 0.00    Total pack years: 33.75    Types: Cigarettes    Start date: 1980   Smokeless tobacco: Never  Vaping Use   Vaping Use: Never used  Substance and Sexual Activity   Alcohol use: Yes    Alcohol/week: 4.0 standard drinks of alcohol    Types: 4 Cans of beer  per week    Comment: moderately   Drug use: Not Currently    Types: Cocaine    Comment: states he has tried it all. Last used cocaine a few months ago.    Sexual activity: Yes  Other Topics Concern   Not on file  Social History Narrative   Not on file   Social Determinants of Health   Financial Resource Strain: Low Risk  (02/03/2022)   Overall Financial Resource Strain (CARDIA)    Difficulty of Paying Living Expenses: Not hard at all  Food Insecurity: No Food Insecurity (02/03/2022)   Hunger Vital Sign  Worried About Programme researcher, broadcasting/film/video in the Last Year: Never true    Ran Out of Food in the Last Year: Never true  Transportation Needs: No Transportation Needs (02/03/2022)   PRAPARE - Administrator, Civil Service (Medical): No    Lack of Transportation (Non-Medical): No  Physical Activity: Insufficiently Active (02/03/2022)   Exercise Vital Sign    Days of Exercise per Week: 5 days    Minutes of Exercise per Session: 20 min  Stress: No Stress Concern Present (02/03/2022)   [employer] of Occupational Health - Occupational Stress Questionnaire    Feeling of Stress : Only a little  Social Connections: Socially Isolated (02/03/2022)   Social Connection and Isolation Panel [NHANES]    Frequency of Communication with Friends and Family: Never    Frequency of Social Gatherings with Friends and Family: Once a week    Attends Religious Services: Never    Database administrator or Organizations: No    Attends Banker Meetings: Never    Marital Status: Married  Catering manager Violence: Not At Risk (02/03/2022)   Humiliation, Afraid, Rape, and Kick questionnaire    Fear of Current or Ex-Partner: No    Emotionally Abused: No    Physically Abused: No    Sexually Abused: No    FAMILY HISTORY:  Family History  Problem Relation Age of Onset   Depression Mother    Hearing loss Mother    Hyperlipidemia Mother    Hypertension Mother    Stroke Mother     Early death Father    Diabetes Brother    Pancreatic cancer Paternal Uncle    Colon cancer Neg Hx     CURRENT MEDICATIONS:  Outpatient Encounter Medications as of 11/03/2022  Medication Sig   acetaminophen (TYLENOL) 650 MG CR tablet Take 1,300 mg by mouth every 8 (eight) hours as needed for pain.   albuterol (VENTOLIN HFA) 108 (90 Base) MCG/ACT inhaler Inhale 2 puffs into the lungs every 6 (six) hours as needed for wheezing or shortness of breath.   apixaban (ELIQUIS) 5 MG TABS tablet Take 1 tablet (5 mg total) by mouth 2 (two) times daily.   Ascorbic Acid (VITAMIN C PO) Take 500 mg by mouth daily. (Patient not taking: Reported on 09/23/2022)   esomeprazole (NEXIUM) 40 MG capsule Take 1 capsule (40 mg total) by mouth daily.   Ferrous Sulfate (IRON) 325 (65 Fe) MG TABS Take 1 tablet by mouth twice daily   ferrous sulfate 325 (65 FE) MG EC tablet TAKE 1 TABLET(325 MG) BY MOUTH TWICE DAILY   flecainide (TAMBOCOR) 100 MG tablet TAKE 1 TABLET(100 MG) BY MOUTH EVERY 12 HOURS   fluticasone (FLONASE) 50 MCG/ACT nasal spray Place 2 sprays into both nostrils daily.   furosemide (LASIX) 20 MG tablet Take 1 tablet (20 mg total) by mouth daily.   guaiFENesin (MUCINEX) 600 MG 12 hr tablet Take 1 tablet (600 mg total) by mouth 2 (two) times daily as needed. (Patient not taking: Reported on 09/23/2022)   Lactobacillus Rhamnosus, GG, (PROBIOTIC COLIC) LIQD Take 1 tablet by mouth 4 (four) times a week.   metFORMIN (GLUCOPHAGE) 1000 MG tablet Take 1 tablet (1,000 mg total) by mouth 2 (two) times daily with a meal.   metoprolol succinate (TOPROL-XL) 100 MG 24 hr tablet Take 1 tablet (100 mg total) by mouth daily.   Multiple Vitamin (MULTIVITAMIN) tablet Take 1 tablet by mouth daily.   oxyCODONE-acetaminophen (PERCOCET/ROXICET) 5-325 MG tablet  Take 1 tablet by mouth every 8 (eight) hours as needed for severe pain. (Patient not taking: Reported on 09/23/2022)   potassium chloride (KLOR-CON M) 10 MEQ tablet Take 1  tablet by mouth once daily   pravastatin (PRAVACHOL) 20 MG tablet TAKE 1 TABLET BY MOUTH 3 TIMES EVERY WEEK   promethazine-dextromethorphan (PROMETHAZINE-DM) 6.25-15 MG/5ML syrup Take 5 mLs by mouth 4 (four) times daily as needed. (Patient not taking: Reported on 09/23/2022)   Pseudoeph-Doxylamine-DM-APAP (NYQUIL PO) Take by mouth. (Patient not taking: Reported on 09/23/2022)   tirzepatide [facility]) 2.5 MG/0.5ML Pen Inject 2.5 mg into the skin once a week.   vitamin B-12 (CYANOCOBALAMIN) 1000 MCG tablet Take 1,000 mcg by mouth daily.   Facility-Administered Encounter Medications as of 11/03/2022  Medication   iron sucrose (VENOFER) 200 mg in sodium chloride 0.9 % 100 mL IVPB    ALLERGIES:  Allergies  Allergen Reactions   Gabapentin Shortness Of Breath   Lyrica [Pregabalin] Shortness Of Breath   Crestor [Rosuvastatin] Other (See Comments)    myalgia   Farxiga [Dapagliflozin]     Developed UTI    LABORATORY DATA:  I have reviewed the labs as listed.  CBC    Component Value Date/Time   WBC 7.8 10/27/2022 0924   RBC 4.55 10/27/2022 0924   HGB 15.3 10/27/2022 0924   HGB 15.8 03/08/2022 0830   HCT 46.4 10/27/2022 0924   HCT 45.2 03/08/2022 0830   PLT 148 (L) 10/27/2022 0924   PLT 132 (L) 03/08/2022 0830   MCV 102.0 (H) 10/27/2022 0924   MCV 100 (H) 03/08/2022 0830   MCH 33.6 10/27/2022 0924   MCHC 33.0 10/27/2022 0924   RDW 13.9 10/27/2022 0924   RDW 12.6 03/08/2022 0830   LYMPHSABS 1.5 10/27/2022 0924   LYMPHSABS 1.4 03/08/2022 0830   MONOABS 0.7 10/27/2022 0924   EOSABS 0.3 10/27/2022 0924   EOSABS 0.2 03/08/2022 0830   BASOSABS 0.1 10/27/2022 0924   BASOSABS 0.0 03/08/2022 0830      Latest Ref Rng & Units 03/08/2022    8:30 AM 11/17/2021    4:20 AM 11/16/2021    1:53 AM  CMP  Glucose 70 - 99 mg/dL 161  096  045   BUN 6 - 24 mg/dL 20  13  11    Creatinine 0.76 - 1.27 mg/dL 4.09  8.11  9.14   Sodium 134 - 144 mmol/L 138  139  137   Potassium 3.5 - 5.2 mmol/L 4.4  4.2   3.9   Chloride 96 - 106 mmol/L 98  101  106   CO2 20 - 29 mmol/L 21  23  22    Calcium 8.7 - 10.2 mg/dL 9.8  9.2  8.9   Total Protein 6.0 - 8.5 g/dL 7.5     Total Bilirubin 0.0 - 1.2 mg/dL 0.4     Alkaline Phos 44 - 121 IU/L 111     AST 0 - 40 IU/L 24     ALT 0 - 44 IU/L 31       DIAGNOSTIC IMAGING:  I have independently reviewed the relevant imaging and discussed with the patient.   WRAP UP:  All questions were answered. The patient knows to call the clinic with any problems, questions or concerns.  Medical decision making: Moderate  Time spent on visit: I spent 20 minutes counseling the patient face to face. The total time spent in the appointment was 30 minutes and more than 50% was on counseling.  Rushie Goltz  Leisa Lenz  11/03/22 9:17 AM

## 2022-11-03 ENCOUNTER — Inpatient Hospital Stay: Payer: PPO | Admitting: Physician Assistant

## 2022-11-03 VITALS — BP 117/86 | HR 67 | Temp 97.2°F | Resp 18 | Ht 69.0 in | Wt 234.0 lb

## 2022-11-03 DIAGNOSIS — K118 Other diseases of salivary glands: Secondary | ICD-10-CM | POA: Diagnosis not present

## 2022-11-03 DIAGNOSIS — D500 Iron deficiency anemia secondary to blood loss (chronic): Secondary | ICD-10-CM | POA: Diagnosis not present

## 2022-11-03 NOTE — Patient Instructions (Signed)
[facility] at [facility] **VISIT SUMMARY & IMPORTANT INSTRUCTIONS **   You were seen today by Rojelio Brenner PA-C for your iron deficiency anemia.    IRON DEFICIENCY ANEMIA You have on and off blood loss from your stomach and intestines that is most likely caused by NSAID medications (ibuprofen, Aleve, naproxen, etc.). You MUST stop taking all NSAID pain medications.  Please talk to your primary care doctor about other safer pain medications for you. Please contact your GI doctor if you continue to have black bowel movements and coffee-ground vomiting. We will schedule you for IV iron x 2 doses.   LABS: Return in 3 months for labs only.  (You will not have an office visit at that time, but I will keep an eye out for your labs and call you if it looks like you need more IV iron.)  FOLLOW-UP APPOINTMENT: Labs and OFFICE visit in 6 months.  ** Thank you for trusting me with your healthcare!  I strive to provide all of my patients with quality care at each visit.  If you receive a survey for this visit, I would be so grateful to you for taking the time to provide feedback.  Thank you in advance!  ~ Azizi Bally                   Dr. Doreatha Massed   &   Rojelio Brenner, PA-C   - - - - - - - - - - - - - - - - - -    Thank you for choosing [facility] at [facility] to provide your oncology and hematology care.  To afford each patient quality time with our provider, please arrive at least 15 minutes before your scheduled appointment time.   If you have a lab appointment with the Cancer Center please come in thru the Main Entrance and check in at the main information desk.  You need to re-schedule your appointment should you arrive 10 or more minutes late.  We strive to give you quality time with our providers, and arriving late affects you and other patients whose appointments are after yours.  Also, if you no show three or more times for  appointments you may be dismissed from the clinic at the providers discretion.     Again, thank you for choosing [facility].  Our hope is that these requests will decrease the amount of time that you wait before being seen by our physicians.       _____________________________________________________________  Should you have questions after your visit to [facility], please contact our office at [phone] and follow the prompts.  Our office hours are 8:00 a.m. and 4:30 p.m. Monday - Friday.  Please note that voicemails left after 4:00 p.m. may not be returned until the following business day.  We are closed weekends and major holidays.  You do have access to a nurse 24-7, just call the main number to the clinic [phone] and do not press any options, hold on the line and a nurse will answer the phone.    For prescription refill requests, have your pharmacy contact our office and allow 72 hours.

## 2022-11-04 ENCOUNTER — Ambulatory Visit (HOSPITAL_COMMUNITY)
Admission: RE | Admit: 2022-11-04 | Discharge: 2022-11-04 | Disposition: A | Discharge status: Home / Self Care | Payer: PPO | Source: Ambulatory Visit | Attending: Acute Care | Admitting: Acute Care

## 2022-11-04 DIAGNOSIS — Z87891 Personal history of nicotine dependence: Secondary | ICD-10-CM | POA: Insufficient documentation

## 2022-11-04 DIAGNOSIS — F17210 Nicotine dependence, cigarettes, uncomplicated: Secondary | ICD-10-CM | POA: Diagnosis not present

## 2022-11-07 ENCOUNTER — Inpatient Hospital Stay: Payer: PPO

## 2022-11-07 ENCOUNTER — Ambulatory Visit
Admission: RE | Admit: 2022-11-07 | Discharge: 2022-11-07 | Disposition: A | Discharge status: Home / Self Care | Payer: PPO | Source: Ambulatory Visit | Attending: Nurse Practitioner | Admitting: Nurse Practitioner

## 2022-11-07 VITALS — BP 115/65 | HR 80 | Temp 98.2°F | Resp 18

## 2022-11-07 VITALS — BP 113/73 | HR 80 | Temp 98.2°F | Resp 18

## 2022-11-07 DIAGNOSIS — D500 Iron deficiency anemia secondary to blood loss (chronic): Secondary | ICD-10-CM | POA: Diagnosis not present

## 2022-11-07 DIAGNOSIS — W57XXXA Bitten or stung by nonvenomous insect and other nonvenomous arthropods, initial encounter: Secondary | ICD-10-CM | POA: Diagnosis not present

## 2022-11-07 DIAGNOSIS — S30861A Insect bite (nonvenomous) of abdominal wall, initial encounter: Secondary | ICD-10-CM | POA: Diagnosis not present

## 2022-11-07 MED ORDER — SODIUM CHLORIDE 0.9 % IV SOLN
510.0000 mg | Freq: Once | INTRAVENOUS | Status: AC
  Administered 2022-11-07: 510 mg via INTRAVENOUS
  Filled 2022-11-07: qty 510

## 2022-11-07 MED ORDER — TRIAMCINOLONE ACETONIDE 0.025 % EX OINT: 1.0000 | TOPICAL_OINTMENT | Freq: Two times a day (BID) | CUTANEOUS | 0 refills | Status: DC

## 2022-11-07 MED ORDER — SODIUM CHLORIDE 0.9 % IV SOLN: Freq: Once | INTRAVENOUS | Status: AC

## 2022-11-07 MED ORDER — DOXYCYCLINE HYCLATE 100 MG PO CAPS: 200.0000 mg | ORAL_CAPSULE | Freq: Once | ORAL | 0 refills | Status: AC

## 2022-11-07 NOTE — ED Triage Notes (Signed)
Pt reports he had a tick bite him on his left side of abdomen x 2 days. He had some clear drainage coming from the area. He has also been fatigue more than usual

## 2022-11-07 NOTE — Progress Notes (Signed)
Patient presents today for Feraheme iron infusion. MAR reviewed and updated. Patient denies any significant changes since his last infusion. Patient took 650  mg of Tylenol and 10 mg Claritin prior to arrival  at 08:20 am.   Patient declined monitor time post Feraheme. Patient teaching performed. Understanding verbalized.   Feraheme given today per MD orders. Tolerated infusion without adverse affects. Vital signs stable. No complaints at this time. Discharged from clinic ambulatory in stable condition. Alert and oriented x 3. F/U with [facility] as scheduled.

## 2022-11-07 NOTE — ED Provider Notes (Signed)
[facility]    CSN: [id number] Arrival date & time: 11/07/22  1138      History   Chief Complaint Chief Complaint  Patient presents with   Rash    Rash and discharge from site of tick bite - Entered by patient    HPI Stephen Roberts is a 57 y.o. male.   Patient presents today for tick bite to left side of abdomen.  Reports he removed the tick on Saturday and now the area is swollen, raised, and itchy.  He also has another area developing swollen and itchy below the tick bite.  Reports overnight, the area drained clear drainage.  No known fevers, body aches or chills.  Reports he has felt more tired than normal, however he feels tired at baseline chronically secondary to chronic iron deficiency anemia.  No neck stiffness, new body pain, or bull's-eye rash.    Past Medical History:  Diagnosis Date   A-fib (HCC)    Arthritis    Diabetes (HCC)    GERD (gastroesophageal reflux disease)    Heart murmur    IDA (iron deficiency anemia)    Pneumonia 06/27/2006    Patient Active Problem List   Diagnosis Date Noted   Current mild episode of major depressive disorder without prior episode (HCC) 07/13/2022   Screening for lung cancer 07/13/2022   Encounter for general adult medical examination with abnormal findings 03/07/2022   Hiatal hernia 01/12/2022   Persistent atrial fibrillation [facility])    Hospital discharge follow-up 09/01/2021   Atrial fibrillation with RVR  08/24/2021   Essential hypertension 08/24/2021   Iron deficiency anemia 08/24/2021   S/P cervical spinal fusion 06/30/2021   Chronic obstructive pulmonary disease (HCC) 06/30/2021   GERD (gastroesophageal reflux disease) 06/30/2021   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   Recurrent sinus infections 05/04/2020   Iron deficiency anemia due to chronic blood loss    Generalized weakness 03/24/2020   Dysphagia    Hyperlipidemia 02/13/2019   Type 2 diabetes mellitus with other specified complication (HCC)  03/14/2018   Cervical disc disorder with radiculopathy, high cervical region 05/30/2017   Chronic pain syndrome 11/10/2014   Allergic rhinitis 07/30/2014   Allergic dermatitis 07/30/2014   Prostate cancer screening 07/30/2014   Incarcerated epigastric hernia 01/11/2013   Incarcerated umbilical hernia 01/11/2013   Tobacco abuse 01/11/2013    Past Surgical History:  Procedure Laterality Date   ANTERIOR FUSION CERVICAL SPINE     CARDIOVERSION N/A 10/05/2021   Procedure: CARDIOVERSION;  Surgeon: Thurmon Fair, MD;  Location: [facility];  Service: Cardiovascular;  Laterality: N/A;   CARDIOVERSION N/A 11/18/2021   Procedure: CARDIOVERSION;  Surgeon: Thomasene Ripple, DO;  Location: [facility];  Service: Cardiovascular;  Laterality: N/A;   COLONOSCOPY WITH PROPOFOL N/A 03/26/2020   Procedure: COLONOSCOPY WITH PROPOFOL;  Surgeon: Corbin Ade, MD;  Location: [facility];  Service: Endoscopy;  Laterality: N/A;   disc in neck Left 05/2017   ESOPHAGEAL DILATION N/A 03/25/2020   Procedure: ESOPHAGEAL DILATION;  Surgeon: Malissa Hippo, MD;  Location: [facility];  Service: Endoscopy;  Laterality: N/A;   ESOPHAGOGASTRODUODENOSCOPY (EGD) WITH PROPOFOL N/A 03/25/2020   Procedure: ESOPHAGOGASTRODUODENOSCOPY (EGD) WITH PROPOFOL;  Surgeon: Malissa Hippo, MD;  Location: [facility];  Service: Endoscopy;  Laterality: N/A;   GIVENS CAPSULE STUDY N/A 04/13/2020   Procedure: GIVENS CAPSULE STUDY;  Surgeon: Corbin Ade, MD;  Location: [facility];  Service: Endoscopy;  Laterality: N/A;  7:30am  INSERTION OF MESH N/A 01/23/2013   Procedure: INSERTION OF MESH;  Surgeon: Ernestene Mention, MD;  Location: [facility];  Service: General;  Laterality: N/A;   LEFT HEART CATH AND CORONARY ANGIOGRAPHY N/A 08/27/2021   Procedure: LEFT HEART CATH AND CORONARY ANGIOGRAPHY;  Surgeon: [place], Peter M, MD;  Location: [facility];  Service: Cardiovascular;  Laterality: N/A;   POLYPECTOMY  03/26/2020    Procedure: POLYPECTOMY;  Surgeon: Corbin Ade, MD;  Location: [facility];  Service: Endoscopy;;   TONSILLECTOMY     as child   VENTRAL HERNIA REPAIR N/A 01/23/2013   Procedure: LAPAROSCOPIC MULTIPLE VENTRAL HERNIAS;  Surgeon: Ernestene Mention, MD;  Location: [facility];  Service: General;  Laterality: N/A;       Home Medications    Prior to Admission medications   Medication Sig Start Date End Date Taking? Authorizing Provider  doxycycline (VIBRAMYCIN) 100 MG capsule Take 2 capsules (200 mg total) by mouth once for 1 dose. 11/07/22 11/07/22 Yes Cathlean Marseilles A, NP  triamcinolone (KENALOG) 0.025 % ointment Apply 1 Application topically 2 (two) times daily. 11/07/22  Yes Valentino Nose, NP  acetaminophen (TYLENOL) 650 MG CR tablet Take 1,300 mg by mouth every 8 (eight) hours as needed for pain.    [provider]  albuterol (VENTOLIN HFA) 108 (90 Base) MCG/ACT inhaler Inhale 2 puffs into the lungs every 6 (six) hours as needed for wheezing or shortness of breath. 03/07/22   Anabel Halon, MD  apixaban (ELIQUIS) 5 MG TABS tablet Take 1 tablet (5 mg total) by mouth 2 (two) times daily. 04/01/22   Camnitz, Andree Coss, MD  Ascorbic Acid (VITAMIN C PO) Take 500 mg by mouth daily.    [provider]  esomeprazole (NEXIUM) 40 MG capsule Take 1 capsule (40 mg total) by mouth daily. 09/01/21   Anabel Halon, MD  Ferrous Sulfate (IRON) 325 (65 Fe) MG TABS Take 1 tablet by mouth twice daily 10/24/22   Anabel Halon, MD  ferrous sulfate 325 (65 FE) MG EC tablet TAKE 1 TABLET(325 MG) BY MOUTH TWICE DAILY 05/20/22   Anabel Halon, MD  flecainide (TAMBOCOR) 100 MG tablet TAKE 1 TABLET(100 MG) BY MOUTH EVERY 12 HOURS 02/16/22   Camnitz, Will Daphine Deutscher, MD  fluticasone (FLONASE) 50 MCG/ACT nasal spray Place 2 sprays into both nostrils daily. 03/07/22   Anabel Halon, MD  furosemide (LASIX) 20 MG tablet Take 1 tablet (20 mg total) by mouth daily. 09/30/22 09/25/23  Christell Constant, MD  guaiFENesin (MUCINEX) 600 MG 12 hr tablet Take 1 tablet (600 mg total) by mouth 2 (two) times daily as needed. 03/17/22   Particia Nearing, PA-C  Lactobacillus Rhamnosus, GG, (PROBIOTIC COLIC) LIQD Take 1 tablet by mouth 4 (four) times a week.    [provider]  metFORMIN (GLUCOPHAGE) 1000 MG tablet Take 1 tablet (1,000 mg total) by mouth 2 (two) times daily with a meal. 06/28/22   Allena Katz, Earlie Lou, MD  metoprolol succinate (TOPROL-XL) 100 MG 24 hr tablet Take 1 tablet (100 mg total) by mouth daily. 03/23/22   Camnitz, Andree Coss, MD  Multiple Vitamin (MULTIVITAMIN) tablet Take 1 tablet by mouth daily.    [provider]  oxyCODONE-acetaminophen (PERCOCET/ROXICET) 5-325 MG tablet Take 1 tablet by mouth every 8 (eight) hours as needed for severe pain.    [provider]  potassium chloride (KLOR-CON M) 10 MEQ tablet Take 1 tablet by mouth once  daily 06/24/22   Camnitz, Andree Coss, MD  pravastatin (PRAVACHOL) 20 MG tablet TAKE 1 TABLET BY MOUTH 3 TIMES EVERY WEEK 01/04/22   Chandrasekhar, Mahesh A, MD  Pseudoeph-Doxylamine-DM-APAP (NYQUIL PO) Take by mouth.    [provider]  tirzepatide Greggory Keen) 2.5 MG/0.5ML Pen Inject 2.5 mg into the skin once a week. 07/13/22   Anabel Halon, MD  vitamin B-12 (CYANOCOBALAMIN) 1000 MCG tablet Take 1,000 mcg by mouth daily.    [provider]    Family History Family History  Problem Relation Age of Onset   Depression Mother    Hearing loss Mother    Hyperlipidemia Mother    Hypertension Mother    Stroke Mother    Early death Father    Diabetes Brother    Pancreatic cancer Paternal Uncle    Colon cancer Neg Hx     Social History Social History   Tobacco Use   Smoking status: Every Day    Packs/day: 0.75    Years: 45.00    Additional pack years: 0.00    Total pack years: 33.75    Types: Cigarettes    Start date: 1980   Smokeless tobacco: Never  Vaping Use   Vaping Use: Never used   Substance Use Topics   Alcohol use: Yes    Alcohol/week: 4.0 standard drinks of alcohol    Types: 4 Cans of beer per week    Comment: moderately   Drug use: Not Currently    Types: Cocaine    Comment: states he has tried it all. Last used cocaine a few months ago.      Allergies   Gabapentin, Lyrica [pregabalin], Crestor [rosuvastatin], and Farxiga [dapagliflozin]   Review of Systems Review of Systems Per HPI  Physical Exam Triage Vital Signs ED Triage Vitals [11/07/22 1151]  Enc Vitals Group     BP 115/65     Pulse Rate 80     Resp 18     Temp 98.2 F (36.8 C)     Temp Source Oral     SpO2 95 %     Weight      Height      Head Circumference      Peak Flow      Pain Score 0     Pain Loc      Pain Edu?      Excl. in GC?    No data found.  Updated Vital Signs BP 115/65 (BP Location: Left Arm)   Pulse 80   Temp 98.2 F (36.8 C) (Oral)   Resp 18   SpO2 95%   Visual Acuity Right Eye Distance:   Left Eye Distance:   Bilateral Distance:    Right Eye Near:   Left Eye Near:    Bilateral Near:     Physical Exam Vitals and nursing note reviewed.  Constitutional:      General: He is not in acute distress.    Appearance: Normal appearance. He is not toxic-appearing.  HENT:     Head: Normocephalic and atraumatic.     Right Ear: External ear normal.     Left Ear: External ear normal.     Mouth/Throat:     Mouth: Mucous membranes are moist.     Pharynx: Oropharynx is clear.  Pulmonary:     Effort: Pulmonary effort is normal. No respiratory distress.  Musculoskeletal:     Cervical back: Normal range of motion and neck supple.  Lymphadenopathy:  Cervical: No cervical adenopathy.  Skin:    General: Skin is warm and dry.     Capillary Refill: Capillary refill takes less than 2 seconds.     Coloration: Skin is not jaundiced or pale.     Findings: Erythema present. No rash.     Comments: Flesh colored urticarial plaque noted to left abdomen.  No  surrounding erythema, active drainage, or warmth.  No insect appreciated within the rash.  No bull's-eye rash.  Neurological:     Mental Status: He is alert and oriented to person, place, and time.  Psychiatric:        Behavior: Behavior is cooperative.      UC Treatments / Results  Labs (all labs ordered are listed, but only abnormal results are displayed) Labs Reviewed - No data to display  EKG   Radiology No results found.  Procedures Procedures (including critical care time)  Medications Ordered in UC Medications - No data to display  Initial Impression / Assessment and Plan / UC Course  I have reviewed the triage vital signs and the nursing notes.  Pertinent labs & imaging results that were available during my care of the patient were reviewed by me and considered in my medical decision making (see chart for details).   Patient is well-appearing, normotensive, afebrile, not tachycardic, not tachypneic, oxygenating well on room air.    1. Tick bite of abdominal wall, initial encounter Will treat patient with doxycycline 200 mg once to cover for tickborne illness Can start triamcinolone ointment to help with itching Supportive care discussed with patient ER and return precautions also discussed  The patient was given the opportunity to ask questions.  All questions answered to their satisfaction.  The patient is in agreement to this plan.    Final Clinical Impressions(s) / UC Diagnoses   Final diagnoses:  Tick bite of abdominal wall, initial encounter     Discharge Instructions      Please take the doxycycline pills once to cover you for tickborne illness since he recently pulled the tick off.  You can use a little bit of the triamcinolone ointment over the itchy areas to help with itching.  Seek care if you develop fever, neck stiffness, headache, new muscle pain/joint aches, or altered mental status.    ED Prescriptions     Medication Sig Dispense Auth.  Provider   doxycycline (VIBRAMYCIN) 100 MG capsule Take 2 capsules (200 mg total) by mouth once for 1 dose. 2 capsule Cathlean Marseilles A, NP   triamcinolone (KENALOG) 0.025 % ointment Apply 1 Application topically 2 (two) times daily. 15 g Valentino Nose, NP      PDMP not reviewed this encounter.   Valentino Nose, NP 11/07/22 [phone]

## 2022-11-07 NOTE — Discharge Instructions (Signed)
Please take the doxycycline pills once to cover you for tickborne illness since he recently pulled the tick off.  You can use a little bit of the triamcinolone ointment over the itchy areas to help with itching.  Seek care if you develop fever, neck stiffness, headache, new muscle pain/joint aches, or altered mental status.

## 2022-11-07 NOTE — Patient Instructions (Signed)
[facility]  Discharge Instructions: Thank you for choosing [facility] to provide your oncology and hematology care.  If you have a lab appointment with the Cancer Center - please note that after April 8th, 2024, all labs will be drawn in the cancer center.  You do not have to check in or register with the main entrance as you have in the past but will complete your check-in in the cancer center.  Wear comfortable clothing and clothing appropriate for easy access to any Portacath or PICC line.   We strive to give you quality time with your provider. You may need to reschedule your appointment if you arrive late (15 or more minutes).  Arriving late affects you and other patients whose appointments are after yours.  Also, if you miss three or more appointments without notifying the office, you may be dismissed from the clinic at the provider's discretion.      For prescription refill requests, have your pharmacy contact our office and allow 72 hours for refills to be completed.    Today you received the following chemotherapy and/or immunotherapy agents Feraheme. Ferumoxytol Injection What is this medication? FERUMOXYTOL (FER ue MOX i tol) treats low levels of iron in your body (iron deficiency anemia). Iron is a mineral that plays an important role in making red blood cells, which carry oxygen from your lungs to the rest of your body. This medicine may be used for other purposes; ask your health care provider or pharmacist if you have questions. COMMON BRAND NAME(S): Feraheme What should I tell my care team before I take this medication? They need to know if you have any of these conditions: Anemia not caused by low iron levels High levels of iron in the blood Magnetic resonance imaging (MRI) test scheduled An unusual or allergic reaction to iron, other medications, foods, dyes, or preservatives Pregnant or trying to get pregnant Breastfeeding How should I  use this medication? This medication is injected into a vein. It is given by your care team in a hospital or clinic setting. Talk to your care team the use of this medication in children. Special care may be needed. Overdosage: If you think you have taken too much of this medicine contact a poison control center or emergency room at once. NOTE: This medicine is only for you. Do not share this medicine with others. What if I miss a dose? It is important not to miss your dose. Call your care team if you are unable to keep an appointment. What may interact with this medication? Other iron products This list may not describe all possible interactions. Give your health care provider a list of all the medicines, herbs, non-prescription drugs, or dietary supplements you use. Also tell them if you smoke, drink alcohol, or use illegal drugs. Some items may interact with your medicine. What should I watch for while using this medication? Visit your care team regularly. Tell your care team if your symptoms do not start to get better or if they get worse. You may need blood work done while you are taking this medication. You may need to follow a special diet. Talk to your care team. Foods that contain iron include: whole grains/cereals, dried fruits, beans, or peas, leafy green vegetables, and organ meats (liver, kidney). What side effects may I notice from receiving this medication? Side effects that you should report to your care team as soon as possible: Allergic reactions--skin rash, itching, hives, swelling   of the face, lips, tongue, or throat Low blood pressure--dizziness, feeling faint or lightheaded, blurry vision Shortness of breath Side effects that usually do not require medical attention (report to your care team if they continue or are bothersome): Flushing Headache Joint pain Muscle pain Nausea Pain, redness, or irritation at injection site This list may not describe all possible side  effects. Call your doctor for medical advice about side effects. You may report side effects to FDA at 1-800-FDA-1088. Where should I keep my medication? This medication is given in a hospital or clinic and will not be stored at home. NOTE: This sheet is a summary. It may not cover all possible information. If you have questions about this medicine, talk to your doctor, pharmacist, or health care provider.  2024 Elsevier/Gold Standard (2021-11-08 00:00:00)       To help prevent nausea and vomiting after your treatment, we encourage you to take your nausea medication as directed.  BELOW ARE SYMPTOMS THAT SHOULD BE REPORTED IMMEDIATELY: *FEVER GREATER THAN 100.4 F (38 C) OR HIGHER *CHILLS OR SWEATING *NAUSEA AND VOMITING THAT IS NOT CONTROLLED WITH YOUR NAUSEA MEDICATION *UNUSUAL SHORTNESS OF BREATH *UNUSUAL BRUISING OR BLEEDING *URINARY PROBLEMS (pain or burning when urinating, or frequent urination) *BOWEL PROBLEMS (unusual diarrhea, constipation, pain near the anus) TENDERNESS IN MOUTH AND THROAT WITH OR WITHOUT PRESENCE OF ULCERS (sore throat, sores in mouth, or a toothache) UNUSUAL RASH, SWELLING OR PAIN  UNUSUAL VAGINAL DISCHARGE OR ITCHING   Items with * indicate a potential emergency and should be followed up as soon as possible or go to the Emergency Department if any problems should occur.  Please show the CHEMOTHERAPY ALERT CARD or IMMUNOTHERAPY ALERT CARD at check-in to the Emergency Department and triage nurse.  Should you have questions after your visit or need to cancel or reschedule your appointment, please contact [facility] [phone]  and follow the prompts.  Office hours are 8:00 a.m. to 4:30 p.m. Monday - Friday. Please note that voicemails left after 4:00 p.m. may not be returned until the following business day.  We are closed weekends and major holidays. You have access to a nurse at all times for urgent questions. Please call the main number  to the clinic [phone] and follow the prompts.  For any non-urgent questions, you may also contact your provider using MyChart. We now offer e-Visits for anyone 18 and older to request care online for non-urgent symptoms. For details visit [email or web address].   Also download the MyChart app! Go to the app store, search "MyChart", open the app, select [facility], and log in with your MyChart username and password.   

## 2022-11-08 ENCOUNTER — Other Ambulatory Visit: Payer: Self-pay

## 2022-11-08 ENCOUNTER — Telehealth: Payer: Self-pay | Admitting: Acute Care

## 2022-11-08 DIAGNOSIS — Z87891 Personal history of nicotine dependence: Secondary | ICD-10-CM

## 2022-11-08 DIAGNOSIS — R911 Solitary pulmonary nodule: Secondary | ICD-10-CM

## 2022-11-08 NOTE — Telephone Encounter (Signed)
Spoke with patient by phone to review results of LDCT.  Lung nodule in left lung with recommendation to repeat LDCT in 6 months, as precaution. Likely not a cancer but would prefer to review nodule again sooner than a year.  Atherosclerosis and emphysema noted.  Patient is on statin medication.  Patient is aware he has a hiatal hernia and he is trying to lose weight.  Was not aware of fatty liver but states he is disabled past few years and has gained significant weight.  His is trying to eat healthier and find ways he can exercise.  Patient acknowledged understanding of discussion and plan to repeat CT.  Will contact him closer to time for scheduling.  He had no questions and is in agreement with plan.  Order placed for repeat LDCT 6 months and results/plan faxed to PCP

## 2022-11-11 ENCOUNTER — Ambulatory Visit: Payer: PPO | Admitting: Internal Medicine

## 2022-11-14 ENCOUNTER — Inpatient Hospital Stay: Payer: PPO | Attending: Hematology

## 2022-11-15 ENCOUNTER — Ambulatory Visit: Payer: PPO | Admitting: Internal Medicine

## 2022-11-24 ENCOUNTER — Encounter: Payer: Self-pay | Admitting: Internal Medicine

## 2022-11-24 ENCOUNTER — Ambulatory Visit (INDEPENDENT_AMBULATORY_CARE_PROVIDER_SITE_OTHER): Payer: PPO | Admitting: Internal Medicine

## 2022-11-24 VITALS — BP 128/86 | HR 79 | Ht 69.0 in | Wt 239.2 lb

## 2022-11-24 DIAGNOSIS — E1169 Type 2 diabetes mellitus with other specified complication: Secondary | ICD-10-CM

## 2022-11-24 DIAGNOSIS — I4819 Other persistent atrial fibrillation: Secondary | ICD-10-CM

## 2022-11-24 DIAGNOSIS — E782 Mixed hyperlipidemia: Secondary | ICD-10-CM | POA: Diagnosis not present

## 2022-11-24 DIAGNOSIS — F5101 Primary insomnia: Secondary | ICD-10-CM | POA: Diagnosis not present

## 2022-11-24 DIAGNOSIS — J449 Chronic obstructive pulmonary disease, unspecified: Secondary | ICD-10-CM

## 2022-11-24 DIAGNOSIS — Z981 Arthrodesis status: Secondary | ICD-10-CM

## 2022-11-24 DIAGNOSIS — G4733 Obstructive sleep apnea (adult) (pediatric): Secondary | ICD-10-CM | POA: Insufficient documentation

## 2022-11-24 DIAGNOSIS — R911 Solitary pulmonary nodule: Secondary | ICD-10-CM

## 2022-11-24 MED ORDER — TRAZODONE HCL 50 MG PO TABS
25.0000 mg | ORAL_TABLET | Freq: Every evening | ORAL | 3 refills | Status: DC | PRN
Start: 2022-11-24 — End: 2023-10-05

## 2022-11-24 NOTE — Assessment & Plan Note (Addendum)
Sleep hygiene discussed, material provided Started trazodone as needed 

## 2022-11-24 NOTE — Assessment & Plan Note (Signed)
Well-controlled Uses albuterol as needed for dyspnea or wheezing, does not need it daily If frequent use needed, will add maintenance inhaler for COPD 

## 2022-11-24 NOTE — Progress Notes (Signed)
Established Patient Office Visit  Subjective:  Patient ID: Stephen Roberts, male    DOB: Dec 17, 1965  Age: 57 y.o. MRN: [id number]  CC:  Chief Complaint  Patient presents with   Diabetes    Follow up    HPI Stephen Roberts is a 57 y.o. male with past medical history of atrial fibrillation,left parotid mass, IDA, type II DM, HLD, chronic neck pain and tobacco abuse who presents for f/u of his chronic medical conditions.  Type II DM: He takes metformin 1000 mg twice daily.  His HbA1c was 7.3 in 02/24.  Does not check blood glucose regularly.  He was given [facility], but could not continue due to its cost.  Denies any polyuria or polyphagia currently. He admits that he needs to improve his diet.  Atrial fibrillation: He is in sinus rhythm now with flecainide.  He takes metoprolol for rate control and Eliquis for [facility].  Followed by cardiology.  He reports intermittent palpitations, for which he takes additional dose of metoprolol, which improves his symptoms.   COPD: He has mild, intermittent dyspnea, for which he uses albuterol inhaler.  Denies any wheezing currently. Denies any fever or chills currently.  He is still smokes about 0.3 pack/day and is trying to quit.  MDD: He reports fatigue, increased appetite and sleep disturbance -mostly insomnia now.  He has difficulty maintaining sleep. He reports that he is tired of his medicines, but states that he overall feels better compared to prior.  He denies any SI or HI currently.   He reports chronic neck pain, has history of cervical spinal fusion surgery for cervical  radiculopathy.  He takes Percocet as needed for severe neck pain.    Past Medical History:  Diagnosis Date   A-fib (HCC)    Arthritis    Diabetes (HCC)    GERD (gastroesophageal reflux disease)    Heart murmur    IDA (iron deficiency anemia)    Pneumonia 06/27/2006    Past Surgical History:  Procedure Laterality Date   ANTERIOR FUSION CERVICAL SPINE      CARDIOVERSION N/A 10/05/2021   Procedure: CARDIOVERSION;  Surgeon: Thurmon Fair, MD;  Location: [facility];  Service: Cardiovascular;  Laterality: N/A;   CARDIOVERSION N/A 11/18/2021   Procedure: CARDIOVERSION;  Surgeon: Thomasene Ripple, DO;  Location: [facility];  Service: Cardiovascular;  Laterality: N/A;   COLONOSCOPY WITH PROPOFOL N/A 03/26/2020   Procedure: COLONOSCOPY WITH PROPOFOL;  Surgeon: Corbin Ade, MD;  Location: [facility];  Service: Endoscopy;  Laterality: N/A;   disc in neck Left 05/2017   ESOPHAGEAL DILATION N/A 03/25/2020   Procedure: ESOPHAGEAL DILATION;  Surgeon: Malissa Hippo, MD;  Location: [facility];  Service: Endoscopy;  Laterality: N/A;   ESOPHAGOGASTRODUODENOSCOPY (EGD) WITH PROPOFOL N/A 03/25/2020   Procedure: ESOPHAGOGASTRODUODENOSCOPY (EGD) WITH PROPOFOL;  Surgeon: Malissa Hippo, MD;  Location: [facility];  Service: Endoscopy;  Laterality: N/A;   GIVENS CAPSULE STUDY N/A 04/13/2020   Procedure: GIVENS CAPSULE STUDY;  Surgeon: Corbin Ade, MD;  Location: [facility];  Service: Endoscopy;  Laterality: N/A;  7:30am   INSERTION OF MESH N/A 01/23/2013   Procedure: INSERTION OF MESH;  Surgeon: Ernestene Mention, MD;  Location: [facility];  Service: General;  Laterality: N/A;   LEFT HEART CATH AND CORONARY ANGIOGRAPHY N/A 08/27/2021   Procedure: LEFT HEART CATH AND CORONARY ANGIOGRAPHY;  Surgeon: Swazil[place], Peter M, MD;  Location: [facility] INVASIVE CV LAB;  Service: Cardiovascular;  Laterality: N/A;  POLYPECTOMY  03/26/2020   Procedure: POLYPECTOMY;  Surgeon: Corbin Ade, MD;  Location: [facility];  Service: Endoscopy;;   TONSILLECTOMY     as child   VENTRAL HERNIA REPAIR N/A 01/23/2013   Procedure: LAPAROSCOPIC MULTIPLE VENTRAL HERNIAS;  Surgeon: Ernestene Mention, MD;  Location: [facility];  Service: General;  Laterality: N/A;    Family History  Problem Relation Age of Onset   Depression Mother    Hearing loss Mother    Hyperlipidemia Mother     Hypertension Mother    Stroke Mother    Early death Father    Diabetes Brother    Pancreatic cancer Paternal Uncle    Colon cancer Neg Hx     Social History   Socioeconomic History   Marital status: Married    Spouse name: Not on file   Number of children: Not on file   Years of education: Not on file   Highest education level: Not on file  Occupational History   Not on file  Tobacco Use   Smoking status: Every Day    Current packs/day: 0.75    Average packs/day: 0.7 packs/day for 45.0 years (33.8 ttl pk-yrs)    Types: Cigarettes    Start date: 1980   Smokeless tobacco: Never  Vaping Use   Vaping status: Never Used  Substance and Sexual Activity   Alcohol use: Yes    Alcohol/week: 4.0 standard drinks of alcohol    Types: 4 Cans of beer per week    Comment: moderately   Drug use: Not Currently    Types: Cocaine    Comment: states he has tried it all. Last used cocaine a few months ago.    Sexual activity: Yes  Other Topics Concern   Not on file  Social History Narrative   Not on file   Social Determinants of Health   Financial Resource Strain: Low Risk  (02/03/2022)   Overall Financial Resource Strain (CARDIA)    Difficulty of Paying Living Expenses: Not hard at all  Food Insecurity: No Food Insecurity (02/03/2022)   Hunger Vital Sign    Worried About Running Out of Food in the Last Year: Never true    Ran Out of Food in the Last Year: Never true  Transportation Needs: No Transportation Needs (02/03/2022)   PRAPARE - Administrator, Civil Service (Medical): No    Lack of Transportation (Non-Medical): No  Physical Activity: Insufficiently Active (02/03/2022)   Exercise Vital Sign    Days of Exercise per Week: 5 days    Minutes of Exercise per Session: 20 min  Stress: No Stress Concern Present (02/03/2022)   [employer] of Occupational Health - Occupational Stress Questionnaire    Feeling of Stress : Only a little  Social Connections: Socially  Isolated (02/03/2022)   Social Connection and Isolation Panel [NHANES]    Frequency of Communication with Friends and Family: Never    Frequency of Social Gatherings with Friends and Family: Once a week    Attends Religious Services: Never    Database administrator or Organizations: No    Attends Banker Meetings: Never    Marital Status: Married  Catering manager Violence: Not At Risk (02/03/2022)   Humiliation, Afraid, Rape, and Kick questionnaire    Fear of Current or Ex-Partner: No    Emotionally Abused: No    Physically Abused: No    Sexually Abused: No    Outpatient Medications Prior to Visit  Medication Sig Dispense Refill   acetaminophen (TYLENOL) 650 MG CR tablet Take 1,300 mg by mouth every 8 (eight) hours as needed for pain.     albuterol (VENTOLIN HFA) 108 (90 Base) MCG/ACT inhaler Inhale 2 puffs into the lungs every 6 (six) hours as needed for wheezing or shortness of breath. 18 g 2   apixaban (ELIQUIS) 5 MG TABS tablet Take 1 tablet (5 mg total) by mouth 2 (two) times daily. 60 tablet 5   Ascorbic Acid (VITAMIN C PO) Take 500 mg by mouth daily.     esomeprazole (NEXIUM) 40 MG capsule Take 1 capsule (40 mg total) by mouth daily. 90 capsule 3   Ferrous Sulfate (IRON) 325 (65 Fe) MG TABS Take 1 tablet by mouth twice daily 240 tablet 0   ferrous sulfate 325 (65 FE) MG EC tablet TAKE 1 TABLET(325 MG) BY MOUTH TWICE DAILY 60 tablet 3   flecainide (TAMBOCOR) 100 MG tablet TAKE 1 TABLET(100 MG) BY MOUTH EVERY 12 HOURS 60 tablet 11   fluticasone (FLONASE) 50 MCG/ACT nasal spray Place 2 sprays into both nostrils daily. 16 g 6   furosemide (LASIX) 20 MG tablet Take 1 tablet (20 mg total) by mouth daily. 90 tablet 3   guaiFENesin (MUCINEX) 600 MG 12 hr tablet Take 1 tablet (600 mg total) by mouth 2 (two) times daily as needed. 20 tablet 0   Lactobacillus Rhamnosus, GG, (PROBIOTIC COLIC) LIQD Take 1 tablet by mouth 4 (four) times a week.     metFORMIN (GLUCOPHAGE) 1000 MG  tablet Take 1 tablet (1,000 mg total) by mouth 2 (two) times daily with a meal. 60 tablet 5   metoprolol succinate (TOPROL-XL) 100 MG 24 hr tablet Take 1 tablet (100 mg total) by mouth daily. 90 tablet 2   Multiple Vitamin (MULTIVITAMIN) tablet Take 1 tablet by mouth daily.     oxyCODONE-acetaminophen (PERCOCET/ROXICET) 5-325 MG tablet Take 1 tablet by mouth every 8 (eight) hours as needed for severe pain.     potassium chloride (KLOR-CON M) 10 MEQ tablet Take 1 tablet by mouth once daily 90 tablet 3   pravastatin (PRAVACHOL) 20 MG tablet TAKE 1 TABLET BY MOUTH 3 TIMES EVERY WEEK 15 tablet 10   Pseudoeph-Doxylamine-DM-APAP (NYQUIL PO) Take by mouth.     triamcinolone (KENALOG) 0.025 % ointment Apply 1 Application topically 2 (two) times daily. 15 g 0   vitamin B-12 (CYANOCOBALAMIN) 1000 MCG tablet Take 1,000 mcg by mouth daily.     tirzepatide [facility]) 2.5 MG/0.5ML Pen Inject 2.5 mg into the skin once a week. 2 mL 3   Facility-Administered Medications Prior to Visit  Medication Dose Route Frequency Provider Last Rate Last Admin   iron sucrose (VENOFER) 200 mg in sodium chloride 0.9 % 100 mL IVPB  200 mg Intravenous Once Pennington, Rebekah M, PA-C        Allergies  Allergen Reactions   Gabapentin Shortness Of Breath   Lyrica [Pregabalin] Shortness Of Breath   Crestor [Rosuvastatin] Other (See Comments)    myalgia   Farxiga [Dapagliflozin]     Developed UTI    ROS Review of Systems  Constitutional:  Negative for chills and fever.  HENT:  Negative for congestion, sinus pressure, sinus pain and sore throat.   Eyes:  Negative for pain and discharge.  Respiratory:  Positive for shortness of breath. Negative for cough.   Cardiovascular:  Positive for palpitations. Negative for chest pain.  Gastrointestinal:  Negative for diarrhea, nausea and vomiting.  Endocrine:  Negative for polydipsia and polyuria.  Genitourinary:  Negative for dysuria and hematuria.  Musculoskeletal:  Positive for  back pain and neck pain. Negative for neck stiffness.  Skin:  Negative for rash.  Neurological:  Negative for dizziness, weakness and numbness.  Psychiatric/Behavioral:  Positive for sleep disturbance. Negative for agitation and behavioral problems.       Objective:    Physical Exam Vitals reviewed.  Constitutional:      General: He is not in acute distress.    Appearance: He is not diaphoretic.  HENT:     Head: Normocephalic and atraumatic.     Nose: No congestion.     Mouth/Throat:     Mouth: Mucous membranes are moist.  Eyes:     General: No scleral icterus.    Extraocular Movements: Extraocular movements intact.  Neck:     Vascular: No carotid bruit.  Cardiovascular:     Rate and Rhythm: Normal rate and regular rhythm.     Pulses: Normal pulses.     Heart sounds: Normal heart sounds. No murmur heard. Pulmonary:     Breath sounds: Normal breath sounds. No wheezing or rales.  Musculoskeletal:     Cervical back: Neck supple. No tenderness.     Right lower leg: No edema.     Left lower leg: No edema.  Skin:    General: Skin is warm.     Findings: No rash.  Neurological:     General: No focal deficit present.     Mental Status: He is alert and oriented to person, place, and time.     Sensory: No sensory deficit.     Motor: No weakness.  Psychiatric:        Mood and Affect: Mood normal.        Behavior: Behavior normal.     BP 128/86 (BP Location: Right Arm)   Pulse 79   Ht 5\' 9"  (1.753 m)   Wt 239 lb 3.2 oz (108.5 kg)   SpO2 93%   BMI 35.32 kg/m  Wt Readings from Last 3 Encounters:  11/24/22 239 lb 3.2 oz (108.5 kg)  11/03/22 234 lb (106.1 kg)  09/23/22 234 lb (106.1 kg)    Lab Results  Component Value Date   TSH 1.910 03/08/2022   Lab Results  Component Value Date   WBC 7.8 10/27/2022   HGB 15.3 10/27/2022   HCT 46.4 10/27/2022   MCV 102.0 (H) 10/27/2022   PLT 148 (L) 10/27/2022   Lab Results  Component Value Date   NA 138 03/08/2022   K 4.4  03/08/2022   CO2 21 03/08/2022   GLUCOSE 145 (H) 03/08/2022   BUN 20 03/08/2022   CREATININE 0.93 03/08/2022   BILITOT 0.4 03/08/2022   ALKPHOS 111 03/08/2022   AST 24 03/08/2022   ALT 31 03/08/2022   PROT 7.5 03/08/2022   ALBUMIN 4.7 03/08/2022   CALCIUM 9.8 03/08/2022   ANIONGAP 15 11/17/2021   EGFR 95 03/08/2022   Lab Results  Component Value Date   CHOL 178 03/08/2022   Lab Results  Component Value Date   HDL 36 (L) 03/08/2022   Lab Results  Component Value Date   LDLCALC 116 (H) 03/08/2022   Lab Results  Component Value Date   TRIG 147 03/08/2022   Lab Results  Component Value Date   CHOLHDL 4.9 03/08/2022   Lab Results  Component Value Date   HGBA1C 7.3 (A) 07/13/2022      Assessment & Plan:  Problem List Items Addressed This Visit       Cardiovascular and Mediastinum   Persistent atrial fibrillation (HCC)    Rate controlled with metoprolol now On flecainide 100 mg daily In sinus rhythm now On Eliquis for [facility] Follow-up with cardiology S/p cardioversion        Respiratory   Chronic obstructive pulmonary disease (HCC)    Well-controlled Uses albuterol as needed for dyspnea or wheezing, does not need it daily If frequent use needed, will add maintenance inhaler for COPD      OSA (obstructive sleep apnea)    Followed by cardiology Did not not tolerate CPAP device Needs to lose weight and consider inspire device      Lung nodule    Noted on low-dose CT chest Planned for repeat CT chest lung nodule follow-up        Endocrine   Type 2 diabetes mellitus with other specified complication (HCC) - Primary    Lab Results  Component Value Date   HGBA1C 7.3 (A) 07/13/2022   Uncontrolled, but improving Associated with HTN, HLD and obesity On Metformin 1000 mg twice daily Added Mounjaro for additional weight loss benefit, but could not continue due to cost concern Can try for Ozempic patient assistance, form provided Advised to follow  diabetic diet F/u CMP and lipid panel On statin and ARB now Diabetic eye exam: Advised to follow up with Ophthalmology for diabetic eye exam      Relevant Orders   CMP14+EGFR   Hemoglobin A1c   Urine Microalbumin w/creat. ratio     Other   Hyperlipidemia    On Pravastatin QOD now Did not tolerate Crestor      Relevant Orders   Lipid Profile   S/P cervical spinal fusion    Has chronic neck and back pain Used to take NSAIDs, likely related to PUD Takes Percocet as needed, not on daily basis - gets about 60 count in a year      Primary insomnia    Sleep hygiene discussed, material provided Started trazodone as needed      Relevant Medications   traZODone (DESYREL) 50 MG tablet     Meds ordered this encounter  Medications   traZODone (DESYREL) 50 MG tablet    Sig: Take 0.5-1 tablets (25-50 mg total) by mouth at bedtime as needed for sleep.    Dispense:  30 tablet    Refill:  3    Follow-up: Return in about 4 months (around 03/27/2023) for Annual physical.    Anabel Halon, MD

## 2022-11-24 NOTE — Assessment & Plan Note (Signed)
Rate controlled with metoprolol now On flecainide 100 mg daily In sinus rhythm now On Eliquis for [facility] Follow-up with cardiology S/p cardioversion 

## 2022-11-24 NOTE — Assessment & Plan Note (Signed)
Has chronic neck and back pain Used to take NSAIDs, likely related to PUD Takes Percocet as needed, not on daily basis - gets about 60 count in a year 

## 2022-11-24 NOTE — Patient Instructions (Addendum)
Please continue taking Metformin as prescribed.  Please continue to take medications as prescribed.  Please continue to follow low carb diet and perform moderate exercise/walking at least 150 mins/week.  Please get fasting blood tests done tomorrow.  Please consider getting Shingrix and Tdap vaccine at local pharmacy.  Please maintain simple sleep hygiene. - Maintain dark and non-noisy environment in the bedroom. - Please use the bedroom for sleep and sexual activity only. - Do not use electronic devices in the bedroom. - Please take dinner at least 2 hours before bedtime. - Please avoid caffeinated products in the evening, including coffee, soft drinks. - Please try to maintain the regular sleep-wake cycle - Go to bed and wake up at the same time.

## 2022-11-24 NOTE — Assessment & Plan Note (Signed)
Followed by cardiology Did not not tolerate CPAP device Needs to lose weight and consider inspire device

## 2022-11-24 NOTE — Assessment & Plan Note (Addendum)
Lab Results  Component Value Date   HGBA1C 7.3 (A) 07/13/2022   Uncontrolled, but improving Associated with HTN, HLD and obesity On Metformin 1000 mg twice daily Added Mounjaro for additional weight loss benefit, but could not continue due to cost concern Can try for Ozempic patient assistance, form provided Advised to follow diabetic diet F/u CMP and lipid panel On statin and ARB now Diabetic eye exam: Advised to follow up with Ophthalmology for diabetic eye exam

## 2022-11-24 NOTE — Assessment & Plan Note (Signed)
On Pravastatin QOD now Did not tolerate Crestor 

## 2022-11-24 NOTE — Assessment & Plan Note (Signed)
Noted on low-dose CT chest Planned for repeat CT chest lung nodule follow-up

## 2022-12-23 ENCOUNTER — Ambulatory Visit: Payer: PPO | Admitting: Cardiology

## 2022-12-23 ENCOUNTER — Telehealth: Payer: Self-pay | Admitting: Pharmacist

## 2022-12-23 ENCOUNTER — Encounter: Payer: Self-pay | Admitting: Cardiology

## 2022-12-23 VITALS — BP 132/70 | HR 69 | Ht 69.0 in | Wt 238.0 lb

## 2022-12-23 DIAGNOSIS — I10 Essential (primary) hypertension: Secondary | ICD-10-CM | POA: Diagnosis not present

## 2022-12-23 DIAGNOSIS — G4733 Obstructive sleep apnea (adult) (pediatric): Secondary | ICD-10-CM | POA: Diagnosis not present

## 2022-12-23 MED ORDER — APIXABAN 5 MG PO TABS: 5.0000 mg | ORAL_TABLET | Freq: Two times a day (BID) | ORAL | Status: DC

## 2022-12-23 NOTE — Addendum Note (Signed)
Addended by: Luellen Pucker on: 12/23/2022 02:30 PM   Modules accepted: Orders

## 2022-12-23 NOTE — Progress Notes (Signed)
Sleep Medicine CONSULT Note    Date:  12/23/2022   ID:  Stephen Roberts, DOB 1966/01/10, MRN [id number]  PCP:  Anabel Halon, MD  Cardiologist: Christell Constant, MD   Chief Complaint  Patient presents with   New Patient (Initial Visit)    OSA    History of Present Illness:  Stephen Roberts is a 57 y.o. male who is being seen today for the evaluation of obstructive sleep apnea at the request of Riley Lam MD.  This is a 57 year old male with a history of paroxysmal atrial fibrillation and hypertension.  He was seen by Gillian Shields, NP back in May 2023 complaining of snoring as well as excessive daytime sleepiness with witnessed apneic episodes during sleep as well.    He underwent home sleep study which demonstrated moderate obstructive sleep apnea with an AHI of 18.1/h and no central events.  He was started on auto CPAP from 4 to 15 cm H2O.  He tried it for a month but did not tolerate it due to mask intolerance.  He would wake up in the middle of the night and the pressure would be so high that he could not tolerate.  He tried a FFM, a nasal pillow mask and nasal mask. Unfortunately he did not like his CPAP and returned to the DME.  He wanted to get referred for inspire device so he is now referred for sleep medicine consultation to establish sleep care.  Past Medical History:  Diagnosis Date   A-fib (HCC)    Arthritis    Diabetes (HCC)    GERD (gastroesophageal reflux disease)    Heart murmur    IDA (iron deficiency anemia)    OSA (obstructive sleep apnea)    moderate obstructive sleep apnea with an AHI of 18.1/h   Pneumonia 06/27/2006    Past Surgical History:  Procedure Laterality Date   ANTERIOR FUSION CERVICAL SPINE     CARDIOVERSION N/A 10/05/2021   Procedure: CARDIOVERSION;  Surgeon: Thurmon Fair, MD;  Location: [facility];  Service: Cardiovascular;  Laterality: N/A;   CARDIOVERSION N/A 11/18/2021   Procedure: CARDIOVERSION;  Surgeon: Thomasene Ripple, DO;  Location: [facility];  Service: Cardiovascular;  Laterality: N/A;   COLONOSCOPY WITH PROPOFOL N/A 03/26/2020   Procedure: COLONOSCOPY WITH PROPOFOL;  Surgeon: Corbin Ade, MD;  Location: [facility];  Service: Endoscopy;  Laterality: N/A;   disc in neck Left 05/2017   ESOPHAGEAL DILATION N/A 03/25/2020   Procedure: ESOPHAGEAL DILATION;  Surgeon: Malissa Hippo, MD;  Location: [facility];  Service: Endoscopy;  Laterality: N/A;   ESOPHAGOGASTRODUODENOSCOPY (EGD) WITH PROPOFOL N/A 03/25/2020   Procedure: ESOPHAGOGASTRODUODENOSCOPY (EGD) WITH PROPOFOL;  Surgeon: Malissa Hippo, MD;  [facility];  Service: Endoscopy;  Laterality: N/A;   GIVENS CAPSULE STUDY N/A 04/13/2020   Procedure: GIVENS CAPSULE STUDY;  Surgeon: Corbin Ade, MD;  Location: [facility];  Service: Endoscopy;  Laterality: N/A;  7:30am   INSERTION OF MESH N/A 01/23/2013   Procedure: INSERTION OF MESH;  Surgeon: Ernestene Mention, MD;  Location: [facility];  Service: General;  Laterality: N/A;   LEFT HEART CATH AND CORONARY ANGIOGRAPHY N/A 08/27/2021   Procedure: LEFT HEART CATH AND CORONARY ANGIOGRAPHY;  Surgeon: [place], Peter M, MD;  Location: [facility] INVASIVE CV LAB;  Service: Cardiovascular;  Laterality: N/A;   POLYPECTOMY  03/26/2020   Procedure: POLYPECTOMY;  Surgeon: Corbin Ade, MD;  [facility]: [facility];  Service: Endoscopy;;   TONSILLECTOMY     as child   VENTRAL HERNIA REPAIR N/A 01/23/2013   Procedure: LAPAROSCOPIC MULTIPLE VENTRAL HERNIAS;  Surgeon: Ernestene Mention, MD;  Location: [facility];  Service: General;  Laterality: N/A;    Current Medications: Current Meds  Medication Sig   acetaminophen (TYLENOL) 650 MG CR tablet Take 1,300 mg by mouth every 8 (eight) hours as needed for pain.   albuterol (VENTOLIN HFA) 108 (90 Base) MCG/ACT inhaler Inhale 2 puffs into the lungs every 6 (six) hours as needed for wheezing or shortness of breath.   apixaban (ELIQUIS) 5 MG TABS tablet Take  1 tablet (5 mg total) by mouth 2 (two) times daily.   esomeprazole (NEXIUM) 40 MG capsule Take 1 capsule (40 mg total) by mouth daily.   Ferrous Sulfate (IRON) 325 (65 Fe) MG TABS Take 1 tablet by mouth twice daily   ferrous sulfate 325 (65 FE) MG EC tablet TAKE 1 TABLET(325 MG) BY MOUTH TWICE DAILY   flecainide (TAMBOCOR) 100 MG tablet TAKE 1 TABLET(100 MG) BY MOUTH EVERY 12 HOURS   fluticasone (FLONASE) 50 MCG/ACT nasal spray Place 2 sprays into both nostrils daily.   furosemide (LASIX) 20 MG tablet Take 1 tablet (20 mg total) by mouth daily.   Lactobacillus Rhamnosus, GG, (PROBIOTIC COLIC) LIQD Take 1 tablet by mouth 4 (four) times a week.   metFORMIN (GLUCOPHAGE) 1000 MG tablet Take 1 tablet (1,000 mg total) by mouth 2 (two) times daily with a meal.   metoprolol succinate (TOPROL-XL) 100 MG 24 hr tablet Take 1 tablet (100 mg total) by mouth daily.   Multiple Vitamin (MULTIVITAMIN) tablet Take 1 tablet by mouth daily.   potassium chloride (KLOR-CON M) 10 MEQ tablet Take 1 tablet by mouth once daily   pravastatin (PRAVACHOL) 20 MG tablet TAKE 1 TABLET BY MOUTH 3 TIMES EVERY WEEK   traZODone (DESYREL) 50 MG tablet Take 0.5-1 tablets (25-50 mg total) by mouth at bedtime as needed for sleep.   vitamin B-12 (CYANOCOBALAMIN) 1000 MCG tablet Take 1,000 mcg by mouth daily.    Allergies:   Gabapentin, Lyrica [pregabalin], Crestor [rosuvastatin], and [place] [dapagliflozin]   Social History   Socioeconomic History   Marital status: Married    Spouse name: Not on file   Number of children: Not on file   Years of education: Not on file   Highest education level: Not on file  Occupational History   Not on file  Tobacco Use   Smoking status: Every Day    Current packs/day: 0.75    Average packs/day: 0.8 packs/day for 45.1 years (33.8 ttl pk-yrs)    Types: Cigarettes    Start date: 58   Smokeless tobacco: Never  Vaping Use   Vaping status: Never Used  Substance and Sexual Activity    Alcohol use: Yes    Alcohol/week: 4.0 standard drinks of alcohol    Types: 4 Cans of beer per week    Comment: moderately   Drug use: Not Currently    Types: Cocaine    Comment: states he has tried it all. Last used cocaine a few months ago.    Sexual activity: Yes  Other Topics Concern   Not on file  Social History Narrative   Not on file   Social Determinants of Health   Financial Resource Strain: Low Risk  (02/03/2022)   Overall Financial Resource Strain (CARDIA)    Difficulty of Paying Living Expenses: Not hard at all  Food  Insecurity: No Food Insecurity (02/03/2022)   Hunger Vital Sign    Worried About Running Out of Food in the Last Year: Never true    Ran Out of Food in the Last Year: Never true  Transportation Needs: No Transportation Needs (02/03/2022)   PRAPARE - Administrator, Civil Service (Medical): No    Lack of Transportation (Non-Medical): No  Physical Activity: Insufficiently Active (02/03/2022)   Exercise Vital Sign    Days of Exercise per Week: 5 days    Minutes of Exercise per Session: 20 min  Stress: No Stress Concern Present (02/03/2022)   [employer] of Occupational Health - Occupational Stress Questionnaire    Feeling of Stress : Only a little  Social Connections: Socially Isolated (02/03/2022)   Social Connection and Isolation Panel [NHANES]    Frequency of Communication with Friends and Family: Never    Frequency of Social Gatherings with Friends and Family: Once a week    Attends Religious Services: Never    Database administrator or Organizations: No    Attends Engineer, structural: Never    Marital Status: Married     Family History:  The patient's family history includes Depression in his mother; Diabetes in his brother; Early death in his father; Hearing loss in his mother; Hyperlipidemia in his mother; Hypertension in his mother; Pancreatic cancer in his paternal uncle; Stroke in his mother.   ROS:   Please see the  history of present illness.    ROS All other systems reviewed and are negative.      No data to display             PHYSICAL EXAM:   VS:  BP 132/70   Pulse 69   Ht 5\' 9"  (1.753 m)   Wt 238 lb (108 kg)   SpO2 98%   BMI 35.15 kg/m    GEN: Well nourished, well developed, in no acute distress  HEENT: normal  Neck: no JVD, carotid bruits, or masses Cardiac: RRR; no murmurs, rubs, or gallops,no edema.  Intact distal pulses bilaterally.  Respiratory:  clear to auscultation bilaterally, normal work of breathing GI: soft, nontender, nondistended, + BS MS: no deformity or atrophy  Skin: warm and dry, no rash Neuro:  Alert and Oriented x 3, Strength and sensation are intact Psych: euthymic mood, full affect  Wt Readings from Last 3 Encounters:  12/23/22 238 lb (108 kg)  11/24/22 239 lb 3.2 oz (108.5 kg)  11/03/22 234 lb (106.1 kg)      Studies/Labs Reviewed:   Home sleep study  Recent Labs: 03/08/2022: ALT 31; BUN 20; Creatinine, Ser 0.93; Potassium 4.4; Sodium 138; TSH 1.910 10/27/2022: Hemoglobin 15.3; Platelets 148    CHA2DS2-VASc Score = 3   This indicates a 3.2% annual risk of stroke. The patient's score is based upon: CHF History: 1 HTN History: 1 Diabetes History: 1 Stroke History: 0 Vascular Disease History: 0 Age Score: 0 Gender Score: 0      ASSESSMENT:    1. OSA (obstructive sleep apnea)   2. Essential hypertension      PLAN:  In order of problems listed above:  OSA  -patient has symptoms of snoring, excessive daytime sleepiness, witnessed apneic events at night and home sleep study showed mild obstructive sleep apnea with an AHI of 18.1/h -He tried auto CPAP but did not like it and send it back without trying for 6 months>>he only used it 1 month and could not  stand it because he could not find a mask that he could tolerate -He is interested in trying the inspire device -we discussed options for treatment of OSA including an oral device  fitted by sleep dentistry, retry CPAP or find out if insurance would go ahead and cover Inspire without having tried it for 6 months  2.  Hypertension -BP is controlled on exam today -Continue prescription drug management with Toprol-XL 100 mg daily with as needed refills  Time Spent: 20 minutes total time of encounter, including 15 minutes spent in face-to-face patient care on the date of this encounter. This time includes coordination of care and counseling regarding above mentioned problem list. Remainder of non-face-to-face time involved reviewing chart documents/testing relevant to the patient encounter and documentation in the medical record. I have independently reviewed documentation from referring provider  Medication Adjustments/Labs and Tests Ordered: Current medicines are reviewed at length with the patient today.  Concerns regarding medicines are outlined above.  Medication changes, Labs and Tests ordered today are listed in the Patient Instructions below.  There are no Patient Instructions on file for this visit.   Signed, Armanda Magic, MD  12/23/2022 1:50 PM    [facility] [address] [place], [place], [place]  [address] Phone: [phone]; Fax: [phone]

## 2022-12-23 NOTE — Patient Instructions (Addendum)
Medication Instructions:  Your physician recommends that you continue on your current medications as directed. Please refer to the Current Medication list given to you today.  In regards to getting on a different anticoagulant medication:  Eliquis: Can apply for patient assistance through [employer]MS. The website for the application is [email or web address] The website lets them know if they meet the income requirement. Note to Medicare Patients: In addition to the application, they need to submit documentation showing they have spent 3% of their annual household income on out-of-pocket prescription expenses for them and/or other members of their household. Their pharmacy can provide this report.  Xarelto: Similar to Eliquis, but it is taken just once a day. The drug company offers a program called Xarelto with Me Coverage Gap Support. It runs from April 1-December 31. Patients in the coverage gap can get Xarelto for $89 (for 30 days) or $250 (for 90 days). They can call to enroll at: 888-XARELTO 817-812-2518)  Dabigatran: Generic of Pradaxa. GoodRx coupon bypasses insurance and brings cost down to ~$70/month if insurance doesn't cover it at a better rate.  Warfarin: Generic and cheap alternative, however it requires frequent in office lab monitoring. It's less effective than the above options and has more food and drug interactions. There may be a copay associated with lab monitoring appts.   *If you need a refill on your cardiac medications before your next appointment, please call your pharmacy*   Lab Work: None.  If you have labs (blood work) drawn today and your tests are completely normal, you will receive your results only by: MyChart Message (if you have MyChart) OR A paper copy in the mail If you have any lab test that is abnormal or we need to change your treatment, we will call you to review the results.   Testing/Procedures: None.   Follow-Up: At [facility], you and your health  needs are our priority.  As part of our continuing mission to provide you with exceptional heart care, we have created designated Provider Care Teams.  These Care Teams include your primary Cardiologist (physician) and Advanced Practice Providers (APPs -  Physician Assistants and Nurse Practitioners) who all work together to provide you with the care you need, when you need it.  We recommend signing up for the patient portal called "MyChart".  Sign up information is provided on this After Visit Summary.  MyChart is used to connect with patients for Virtual Visits (Telemedicine).  Patients are able to view lab/test results, encounter notes, upcoming appointments, etc.  Non-urgent messages can be sent to your provider as well.   To learn more about what you can do with MyChart, go to [email or web address].    Your next appointment:   3 month(s)  Provider:   Dr. Armanda Magic, MD

## 2022-12-23 NOTE — Telephone Encounter (Signed)
Eliquis BMS pt assistance application has been faxed.

## 2023-01-03 NOTE — Addendum Note (Signed)
Addended by: Bryson Palen E on: 01/03/2023 03:45 PM   Modules accepted: Orders

## 2023-01-03 NOTE — Telephone Encounter (Signed)
**Note De-Identified Neizan Debruhl Obfuscation** Letter received from [facility] Godfrey Tritschler fax stating that they have approved the pt for Eliquis assistance until 05/16/2023. [id number]  The letter states that they have notified the pt of this approval as well.

## 2023-01-03 NOTE — Telephone Encounter (Signed)
Left detailed message for pt notifying him of approval and leaving BMS # to call to schedule med shipment as well.

## 2023-01-04 NOTE — Telephone Encounter (Signed)
Pt called clinic stating BMS told him he was denied assistance. Unclear why they would have told him this when they faxed [place] an approval letter. I don't see the approval scanned in the media tab, but it looks like we got the fax a few days after he was told he was denied. I provided him with the approval # and he will try calling back to BMS. He will let us know if he has any issues with this.

## 2023-01-17 ENCOUNTER — Other Ambulatory Visit: Payer: Self-pay | Admitting: Cardiology

## 2023-01-23 ENCOUNTER — Telehealth: Payer: Self-pay | Admitting: Internal Medicine

## 2023-01-23 ENCOUNTER — Ambulatory Visit (INDEPENDENT_AMBULATORY_CARE_PROVIDER_SITE_OTHER): Payer: PPO

## 2023-01-23 ENCOUNTER — Other Ambulatory Visit: Payer: Self-pay

## 2023-01-23 DIAGNOSIS — J449 Chronic obstructive pulmonary disease, unspecified: Secondary | ICD-10-CM

## 2023-01-23 DIAGNOSIS — K219 Gastro-esophageal reflux disease without esophagitis: Secondary | ICD-10-CM

## 2023-01-23 DIAGNOSIS — E1169 Type 2 diabetes mellitus with other specified complication: Secondary | ICD-10-CM

## 2023-01-23 DIAGNOSIS — E782 Mixed hyperlipidemia: Secondary | ICD-10-CM

## 2023-01-23 MED ORDER — IRON 325 (65 FE) MG PO TABS: 1.0000 | ORAL_TABLET | Freq: Two times a day (BID) | ORAL | 0 refills | Status: DC

## 2023-01-23 MED ORDER — PRAVASTATIN SODIUM 20 MG PO TABS
ORAL_TABLET | ORAL | 10 refills | Status: DC
Start: 2023-01-23 — End: 2024-02-26

## 2023-01-23 MED ORDER — ALBUTEROL SULFATE HFA 108 (90 BASE) MCG/ACT IN AERS: 2.0000 | INHALATION_SPRAY | Freq: Four times a day (QID) | RESPIRATORY_TRACT | 2 refills | Status: DC | PRN

## 2023-01-23 MED ORDER — ESOMEPRAZOLE MAGNESIUM 40 MG PO CPDR
40.0000 mg | DELAYED_RELEASE_CAPSULE | Freq: Every day | ORAL | 3 refills | Status: DC
Start: 2023-01-23 — End: 2024-04-09

## 2023-01-23 MED ORDER — METFORMIN HCL 1000 MG PO TABS: 1000.0000 mg | ORAL_TABLET | Freq: Two times a day (BID) | ORAL | 5 refills | Status: DC

## 2023-01-23 NOTE — Progress Notes (Signed)
Stephen Roberts arrived 01/23/2023 and has given verbal consent to obtain images and complete their overdue diabetic retinal screening.  The images have been sent to an ophthalmologist or optometrist for review and interpretation.  Results will be sent back to Anabel Halon, MD for review.  Patient has been informed they will be contacted when we receive the results via telephone or MyChart

## 2023-01-23 NOTE — Telephone Encounter (Signed)
Refills sent to pharmacy. 

## 2023-01-23 NOTE — Telephone Encounter (Signed)
Prescription Request  01/23/2023  LOV: 11/24/2022  What is the name of the medication or equipment? esomeprazole (NEXIUM) 40 MG capsule [[id number]]  metFORMIN (GLUCOPHAGE) 1000 MG tablet [[id number]]  pravastatin (PRAVACHOL) 20 MG tablet [[id number]]  Ferrous Sulfate (IRON) 325 (65 Fe) MG TABS [[id number]]  albuterol (VENTOLIN HFA) 108 (90 Base) MCG/ACT inhaler [[id number]]    Have you contacted your pharmacy to request a refill? No   Which pharmacy would you like this sent to?  [facility] [address] [place] - [address] [place] [address] Phone: [phone] Fax: [phone]    Patient notified that their request is being sent to the clinical staff for review and that they should receive a response within 2 business days.   Please advise at Mobile [phone] (mobile)

## 2023-01-24 ENCOUNTER — Encounter: Payer: Self-pay | Admitting: Cardiology

## 2023-02-03 ENCOUNTER — Telehealth: Payer: Self-pay

## 2023-02-03 ENCOUNTER — Inpatient Hospital Stay: Payer: PPO | Attending: Hematology

## 2023-02-03 NOTE — Telephone Encounter (Signed)
Faxed most recent sleep study to Dr. Jenne Pane per patient request, notified patient of same via Mychart.

## 2023-02-07 ENCOUNTER — Telehealth: Payer: Self-pay | Admitting: Internal Medicine

## 2023-02-07 NOTE — Telephone Encounter (Signed)
Patient called in requesting results for Drs  Wants a call back has not heard anything since came in for screening

## 2023-02-07 NOTE — Telephone Encounter (Signed)
Spoke to patient

## 2023-02-08 DIAGNOSIS — E1169 Type 2 diabetes mellitus with other specified complication: Secondary | ICD-10-CM | POA: Diagnosis not present

## 2023-02-08 DIAGNOSIS — E782 Mixed hyperlipidemia: Secondary | ICD-10-CM | POA: Diagnosis not present

## 2023-02-09 LAB — LIPID PANEL
Chol/HDL Ratio: 4.8 ratio (ref 0.0–5.0)
Cholesterol, Total: 162 mg/dL (ref 100–199)
HDL: 34 mg/dL — ABNORMAL LOW (ref >39)
LDL Chol Calc (NIH): 88 mg/dL (ref 0–99)
Triglycerides: 239 mg/dL — ABNORMAL HIGH (ref 0–149)
VLDL Cholesterol Cal: 40 mg/dL (ref 5–40)

## 2023-02-09 LAB — CMP14+EGFR
ALT: 18 IU/L (ref 0–44)
AST: 18 IU/L (ref 0–40)
Albumin: 4.2 g/dL (ref 3.8–4.9)
Alkaline Phosphatase: 125 IU/L — ABNORMAL HIGH (ref 44–121)
BUN/Creatinine Ratio: 20 (ref 9–20)
BUN: 15 mg/dL (ref 6–24)
Bilirubin Total: 0.3 mg/dL (ref 0.0–1.2)
CO2: 24 mmol/L (ref 20–29)
Calcium: 9.7 mg/dL (ref 8.7–10.2)
Chloride: 96 mmol/L (ref 96–106)
Creatinine, Ser: 0.76 mg/dL (ref 0.76–1.27)
Globulin, Total: 3.2 g/dL (ref 1.5–4.5)
Glucose: 161 mg/dL — ABNORMAL HIGH (ref 70–99)
Potassium: 4.6 mmol/L (ref 3.5–5.2)
Sodium: 136 mmol/L (ref 134–144)
Total Protein: 7.4 g/dL (ref 6.0–8.5)
eGFR: 104 mL/min/{1.73_m2} (ref >59)

## 2023-02-09 LAB — HEMOGLOBIN A1C
Est. average glucose Bld gHb Est-mCnc: 209 mg/dL
Hgb A1c MFr Bld: 8.9 % — ABNORMAL HIGH (ref 4.8–5.6)

## 2023-02-10 ENCOUNTER — Telehealth: Payer: Self-pay | Admitting: Internal Medicine

## 2023-02-10 NOTE — Telephone Encounter (Signed)
Patient called left voicemail retuning lab results call

## 2023-02-10 NOTE — Telephone Encounter (Signed)
Spoke to patient

## 2023-02-13 ENCOUNTER — Telehealth: Payer: Self-pay | Admitting: Internal Medicine

## 2023-02-13 ENCOUNTER — Ambulatory Visit (INDEPENDENT_AMBULATORY_CARE_PROVIDER_SITE_OTHER): Payer: PPO

## 2023-02-13 VITALS — Ht 69.0 in | Wt 240.0 lb

## 2023-02-13 DIAGNOSIS — E119 Type 2 diabetes mellitus without complications: Secondary | ICD-10-CM | POA: Diagnosis not present

## 2023-02-13 DIAGNOSIS — Z0000 Encounter for general adult medical examination without abnormal findings: Secondary | ICD-10-CM

## 2023-02-13 NOTE — Patient Instructions (Signed)
[name] , Thank you for taking time to come for your Medicare Wellness Visit. I appreciate your ongoing commitment to your health goals. Please review the following plan we discussed and let me know if I can assist you in the future.   Referrals/Orders/Follow-Ups/Clinician Recommendations:  Next Medicare Annual Wellness Visit: May 01, 2024 at 3:50 pm virtual visit  This is a list of the screening recommended for you and due dates:  Health Maintenance  Topic Date Due   Zoster (Shingles) Vaccine (1 of 2) Never done   COVID-19 Vaccine (3 - Moderna risk series) 12/01/2019   DTaP/Tdap/Td vaccine (2 - Td or Tdap) 03/23/2022   Yearly kidney health urinalysis for diabetes  09/02/2022   Eye exam for diabetics  09/08/2022   Flu Shot  12/15/2022   Complete foot exam   03/08/2023   Hemoglobin A1C  08/08/2023   Screening for Lung Cancer  11/04/2023   Yearly kidney function blood test for diabetes  02/08/2024   Medicare Annual Wellness Visit  02/13/2024   Colon Cancer Screening  03/26/2025   Hepatitis C Screening  Completed   HIV Screening  Completed   HPV Vaccine  Aged Out    Advanced directives: (Copy Requested) Please bring a copy of your health care power of attorney and living will to the office to be added to your chart at your convenience.  Next Medicare Annual Wellness Visit scheduled for next year: Yes  Preventive Care 59-61 Years Old, Male Preventive care refers to lifestyle choices and visits with your health care provider that can promote health and wellness. Preventive care visits are also called wellness exams. What can I expect for my preventive care visit? Counseling During your preventive care visit, your health care provider may ask about your: Medical history, including: Past medical problems. Family medical history. Current health, including: Emotional well-being. Home life and relationship well-being. Sexual activity. Lifestyle, including: Alcohol, nicotine  or tobacco, and drug use. Access to firearms. Diet, exercise, and sleep habits. Safety issues such as seatbelt and bike helmet use. Sunscreen use. Work and work Astronomer. Physical exam Your health care provider will check your: Height and weight. These may be used to calculate your BMI (body mass index). BMI is a measurement that tells if you are at a healthy weight. Waist circumference. This measures the distance around your waistline. This measurement also tells if you are at a healthy weight and may help predict your risk of certain diseases, such as type 2 diabetes and high blood pressure. Heart rate and blood pressure. Body temperature. Skin for abnormal spots. What immunizations do I need?  Vaccines are usually given at various ages, according to a schedule. Your health care provider will recommend vaccines for you based on your age, medical history, and lifestyle or other factors, such as travel or where you work. What tests do I need? Screening Your health care provider may recommend screening tests for certain conditions. This may include: Lipid and cholesterol levels. Diabetes screening. This is done by checking your blood sugar (glucose) after you have not eaten for a while (fasting). Hepatitis B test. Hepatitis C test. HIV (human immunodeficiency virus) test. STI (sexually transmitted infection) testing, if you are at risk. Lung cancer screening. Prostate cancer screening. Colorectal cancer screening. Talk with your health care provider about your test results, treatment options, and if necessary, the need for more tests. Follow these instructions at home: Eating and drinking  Eat a diet that includes fresh fruits and vegetables,  whole grains, lean protein, and low-fat dairy products. Take vitamin and mineral supplements as recommended by your health care provider. Do not drink alcohol if your health care provider tells you not to drink. If you drink alcohol: Limit  how much you have to 0-2 drinks a day. Know how much alcohol is in your drink. In the [place] one drink equals one 12 oz bottle of beer (355 mL), one 5 oz glass of wine (148 mL), or one 1 oz glass of hard liquor (44 mL). Lifestyle Brush your teeth every morning and night with fluoride toothpaste. Floss one time each day. Exercise for at least 30 minutes 5 or more days each week. Do not use any products that contain nicotine or tobacco. These products include cigarettes, chewing tobacco, and vaping devices, such as e-cigarettes. If you need help quitting, ask your health care provider. Do not use drugs. If you are sexually active, practice safe sex. Use a condom or other form of protection to prevent STIs. Take aspirin only as told by your health care provider. Make sure that you understand how much to take and what form to take. Work with your health care provider to find out whether it is safe and beneficial for you to take aspirin daily. Find healthy ways to manage stress, such as: Meditation, yoga, or listening to music. Journaling. Talking to a trusted person. Spending time with friends and family. Minimize exposure to UV radiation to reduce your risk of skin cancer. Safety Always wear your seat belt while driving or riding in a vehicle. Do not drive: If you have been drinking alcohol. Do not ride with someone who has been drinking. When you are tired or distracted. While texting. If you have been using any mind-altering substances or drugs. Wear a helmet and other protective equipment during sports activities. If you have firearms in your house, make sure you follow all gun safety procedures. What's next? Go to your health care provider once a year for an annual wellness visit. Ask your health care provider how often you should have your eyes and teeth checked. Stay up to date on all vaccines. This information is not intended to replace advice given to you by your health care  provider. Make sure you discuss any questions you have with your health care provider. Document Revised: 10/28/2020 Document Reviewed: 10/28/2020 Elsevier Patient Education  2024 [employer].

## 2023-02-13 NOTE — Progress Notes (Signed)
Because this visit was a virtual/telehealth visit,  certain criteria was not obtained, such a blood pressure, CBG if applicable, and timed get up and go. Any medications not marked as "taking" were not mentioned during the medication reconciliation part of the visit. Any vitals not documented were not able to be obtained due to this being a telehealth visit or patient was unable to self-report a recent blood pressure reading due to a lack of equipment at home via telehealth. Vitals that have been documented are verbally provided by the patient.   Subjective:   Stephen Roberts is a 57 y.o. male who presents for Medicare Annual/Subsequent preventive examination.  Visit Complete: Virtual  I connected with  Stephen Roberts on 02/13/23 by a audio enabled telemedicine application and verified that I am speaking with the correct person using two identifiers.  Patient Location: Home  Provider Location: Home Office  I discussed the limitations of evaluation and management by telemedicine. The patient expressed understanding and agreed to proceed.  Patient Medicare AWV questionnaire was completed by the patient on n/a; I have confirmed that all information answered by patient is correct and no changes since this date.  Cardiac Risk Factors include: advanced age (>61men, >67 women);diabetes mellitus;dyslipidemia;hypertension;male gender;obesity (BMI >30kg/m2);sedentary lifestyle;smoking/ tobacco exposure     Objective:    Today's Vitals   02/13/23 0808  Weight: 240 lb (108.9 kg)  Height: 5\' 9"  (1.753 m)  PainSc: 4    Body mass index is 35.44 kg/m.     02/13/2023    8:12 AM 11/03/2022    8:28 AM 05/05/2022   11:32 AM 02/03/2022    8:14 AM 11/22/2021    4:14 PM 11/14/2021    9:35 AM 11/09/2021    4:02 PM  Advanced Directives  Does Patient Have a Medical Advance Directive? Yes No Yes No;Yes No No;Yes Yes  Type of Estate agent of [place];Living will  Healthcare Power of  [place];Living will Healthcare Power of [employer] of [place];Living will;Out of facility DNR (pink MOST or yellow form)   Does patient want to make changes to medical advance directive?   No - Patient declined No - Patient declined     Copy of Healthcare Power of Attorney in Chart? No - copy requested  No - copy requested No - copy requested  No - copy requested   Would patient like information on creating a medical advance directive?  No - Patient declined  No - Patient declined No - Patient declined No - Patient declined     Current Medications (verified) Outpatient Encounter Medications as of 02/13/2023  Medication Sig   acetaminophen (TYLENOL) 650 MG CR tablet Take 1,300 mg by mouth every 8 (eight) hours as needed for pain.   albuterol (VENTOLIN HFA) 108 (90 Base) MCG/ACT inhaler Inhale 2 puffs into the lungs every 6 (six) hours as needed for wheezing or shortness of breath.   apixaban (ELIQUIS) 5 MG TABS tablet Take 1 tablet (5 mg total) by mouth 2 (two) times daily.   esomeprazole (NEXIUM) 40 MG capsule Take 1 capsule (40 mg total) by mouth daily.   Ferrous Sulfate (IRON) 325 (65 Fe) MG TABS Take 1 tablet (325 mg total) by mouth 2 (two) times daily.   ferrous sulfate 325 (65 FE) MG EC tablet TAKE 1 TABLET(325 MG) BY MOUTH TWICE DAILY   flecainide (TAMBOCOR) 100 MG tablet TAKE 1 TABLET(100 MG) BY MOUTH EVERY 12 HOURS   fluticasone (FLONASE) 50 MCG/ACT  nasal spray Place 2 sprays into both nostrils daily.   furosemide (LASIX) 20 MG tablet Take 1 tablet (20 mg total) by mouth daily.   Lactobacillus Rhamnosus, GG, (PROBIOTIC COLIC) LIQD Take 1 tablet by mouth 4 (four) times a week.   metFORMIN (GLUCOPHAGE) 1000 MG tablet Take 1 tablet (1,000 mg total) by mouth 2 (two) times daily with a meal.   metoprolol succinate (TOPROL-XL) 100 MG 24 hr tablet TAKE 1  BY MOUTH ONCE DAILY   Multiple Vitamin (MULTIVITAMIN) tablet Take 1 tablet by mouth daily.   potassium chloride (KLOR-CON  M) 10 MEQ tablet Take 1 tablet by mouth once daily   pravastatin (PRAVACHOL) 20 MG tablet TAKE 1 TABLET BY MOUTH 3 TIMES EVERY WEEK   traZODone (DESYREL) 50 MG tablet Take 0.5-1 tablets (25-50 mg total) by mouth at bedtime as needed for sleep.   vitamin B-12 (CYANOCOBALAMIN) 1000 MCG tablet Take 1,000 mcg by mouth daily.   Facility-Administered Encounter Medications as of 02/13/2023  Medication   iron sucrose (VENOFER) 200 mg in sodium chloride 0.9 % 100 mL IVPB    Allergies (verified) Gabapentin, Lyrica [pregabalin], Crestor [rosuvastatin], and Farxiga [dapagliflozin]   History: Past Medical History:  Diagnosis Date   A-fib (HCC)    Arthritis    Diabetes (HCC)    GERD (gastroesophageal reflux disease)    Heart murmur    IDA (iron deficiency anemia)    OSA (obstructive sleep apnea)    moderate obstructive sleep apnea with an AHI of 18.1/h   Pneumonia 06/27/2006   Past Surgical History:  Procedure Laterality Date   ANTERIOR FUSION CERVICAL SPINE     CARDIOVERSION N/A 10/05/2021   Procedure: CARDIOVERSION;  Surgeon: Thurmon Fair, MD;  Location: [facility];  Service: Cardiovascular;  Laterality: N/A;   CARDIOVERSION N/A 11/18/2021   Procedure: CARDIOVERSION;  Surgeon: Thomasene Ripple, DO;  Location: [facility];  Service: Cardiovascular;  Laterality: N/A;   COLONOSCOPY WITH PROPOFOL N/A 03/26/2020   Procedure: COLONOSCOPY WITH PROPOFOL;  Surgeon: Corbin Ade, MD;  Location: [facility];  Service: Endoscopy;  Laterality: N/A;   disc in neck Left 05/2017   ESOPHAGEAL DILATION N/A 03/25/2020   Procedure: ESOPHAGEAL DILATION;  Surgeon: Malissa Hippo, MD;  Location: [facility];  Service: Endoscopy;  Laterality: N/A;   ESOPHAGOGASTRODUODENOSCOPY (EGD) WITH PROPOFOL N/A 03/25/2020   Procedure: ESOPHAGOGASTRODUODENOSCOPY (EGD) WITH PROPOFOL;  Surgeon: Malissa Hippo, MD;  Location: [facility];  Service: Endoscopy;  Laterality: N/A;   GIVENS CAPSULE STUDY N/A 04/13/2020    Procedure: GIVENS CAPSULE STUDY;  Surgeon: Corbin Ade, MD;  Location: [facility];  Service: Endoscopy;  Laterality: N/A;  7:30am   INSERTION OF MESH N/A 01/23/2013   Procedure: INSERTION OF MESH;  Surgeon: Ernestene Mention, MD;  Location: [facility];  Service: General;  Laterality: N/A;   LEFT HEART CATH AND CORONARY ANGIOGRAPHY N/A 08/27/2021   Procedure: LEFT HEART CATH AND CORONARY ANGIOGRAPHY;  Surgeon: [place], Peter M, MD;  [facility];  Service: Cardiovascular;  Laterality: N/A;   POLYPECTOMY  03/26/2020   Procedure: POLYPECTOMY;  Surgeon: Corbin Ade, MD;  Location: [facility];  Service: Endoscopy;;   TONSILLECTOMY     as child   VENTRAL HERNIA REPAIR N/A 01/23/2013   Procedure: LAPAROSCOPIC MULTIPLE VENTRAL HERNIAS;  Surgeon: Ernestene Mention, MD;  Location: [facility];  Service: General;  Laterality: N/A;   Family History  Problem Relation Age of Onset   Depression  Mother    Hearing loss Mother    Hyperlipidemia Mother    Hypertension Mother    Stroke Mother    Early death Father    Diabetes Brother    Pancreatic cancer Paternal Uncle    Colon cancer Neg Hx    Social History   Socioeconomic History   Marital status: Married    Spouse name: Not on file   Number of children: Not on file   Years of education: Not on file   Highest education level: Not on file  Occupational History   Not on file  Tobacco Use   Smoking status: Every Day    Current packs/day: 0.75    Average packs/day: 0.8 packs/day for 45.2 years (33.9 ttl pk-yrs)    Types: Cigarettes    Start date: 54   Smokeless tobacco: Never  Vaping Use   Vaping status: Never Used  Substance and Sexual Activity   Alcohol use: Yes    Alcohol/week: 4.0 standard drinks of alcohol    Types: 4 Cans of beer per week    Comment: moderately   Drug use: Not Currently    Types: Cocaine    Comment: states he has tried it all. Last used cocaine a few months ago.    Sexual activity: Yes  Other  Topics Concern   Not on file  Social History Narrative   Not on file   Social Determinants of Health   Financial Resource Strain: Low Risk  (02/13/2023)   Overall Financial Resource Strain (CARDIA)    Difficulty of Paying Living Expenses: Not hard at all  Food Insecurity: No Food Insecurity (02/13/2023)   Hunger Vital Sign    Worried About Running Out of Food in the Last Year: Never true    Ran Out of Food in the Last Year: Never true  Transportation Needs: No Transportation Needs (02/13/2023)   PRAPARE - Administrator, Civil Service (Medical): No    Lack of Transportation (Non-Medical): No  Physical Activity: Insufficiently Active (02/13/2023)   Exercise Vital Sign    Days of Exercise per Week: 5 days    Minutes of Exercise per Session: 20 min  Stress: No Stress Concern Present (02/13/2023)   [employer] of Occupational Health - Occupational Stress Questionnaire    Feeling of Stress : Only a little  Social Connections: Socially Isolated (02/13/2023)   Social Connection and Isolation Panel [NHANES]    Frequency of Communication with Friends and Family: Never    Frequency of Social Gatherings with Friends and Family: Once a week    Attends Religious Services: Never    Database administrator or Organizations: No    Attends Engineer, structural: Never    Marital Status: Married    Tobacco Counseling Ready to quit: Yes Counseling given: Yes   Clinical Intake:  Pre-visit preparation completed: Yes  Pain : 0-10 Pain Score: 4  Pain Type: Chronic pain Pain Location: Neck Pain Orientation: Left Pain Descriptors / Indicators: Burning, Constant Pain Onset: More than a month ago Pain Frequency: Constant     BMI - recorded: 35.44 Nutritional Status: BMI > 30  Obese Nutritional Risks: None Diabetes: Yes CBG done?: No (telehealth visit.) Did pt. bring in CBG monitor from home?: No  How often do you need to have someone help you when you read  instructions, pamphlets, or other written materials from your doctor or pharmacy?: 1 - Never  Interpreter Needed?: No  Information entered by ::  Abby Laina Guerrieri, CMA   Activities of Daily Living    02/13/2023    8:09 AM  In your present state of health, do you have any difficulty performing the following activities:  Hearing? 1  Comment referral for audiology placed today  Vision? 0  Difficulty concentrating or making decisions? 0  Walking or climbing stairs? 1  Comment sometimes depending on the pain  Dressing or bathing? 0  Doing errands, shopping? 0  Preparing Food and eating ? N  Using the Toilet? N  In the past six months, have you accidently leaked urine? N  Do you have problems with loss of bowel control? N  Managing your Medications? N  Managing your Finances? N  Housekeeping or managing your Housekeeping? N    Patient Care Team: Anabel Halon, MD as PCP - General (Internal Medicine) Christell Constant, MD as PCP - Cardiology (Cardiology) Quintella Reichert, MD as PCP - Sleep Medicine (Cardiology) Regan Lemming, MD as PCP - Electrophysiology (Cardiology) Therese Sarah, RN as Oncology Nurse Navigator (Oncology)  Indicate any recent Medical Services you may have received from other than [facility] providers in the past year (date may be approximate).     Assessment:   This is a routine wellness examination for [place].  Hearing/Vision screen Hearing Screening - Comments:: Referral placed for audiology today  Vision Screening - Comments:: Wears rx glasses - up to date with routine eye exams seeing Dr. Charise Killian 02/13/2023   Goals Addressed             This Visit's Progress    Patient Stated       Lose weight, quit smoking, and lower my A1C       Depression Screen    02/13/2023    8:19 AM 11/24/2022    8:14 AM 07/13/2022    4:11 PM 03/07/2022    8:50 AM 02/03/2022    8:15 AM 02/03/2022    8:14 AM 12/02/2021    1:26 PM  PHQ 2/9 Scores  PHQ - 2 Score  0 0 3 0 0 0 0  PHQ- 9 Score 0 0 12        Fall Risk    02/13/2023    8:12 AM 11/24/2022    8:14 AM 07/13/2022    4:11 PM 03/07/2022    8:50 AM 02/03/2022    8:15 AM  Fall Risk   Falls in the past year? 0 0 0 0 0  Number falls in past yr: 0 0 0 0 0  Injury with Fall? 0 0 0 0 0  Risk for fall due to : No Fall Risks No Fall Risks  No Fall Risks No Fall Risks  Follow up Falls prevention discussed Falls evaluation completed  Falls evaluation completed Falls evaluation completed    MEDICARE RISK AT HOME: Medicare Risk at Home Any stairs in or around the home?: No If so, are there any without handrails?: No Home free of loose throw rugs in walkways, pet beds, electrical cords, etc?: Yes Adequate lighting in your home to reduce risk of falls?: Yes Life alert?: No Use of a cane, walker or w/c?: No Grab bars in the bathroom?: Yes Shower chair or bench in shower?: No Elevated toilet seat or a handicapped toilet?: Yes  TIMED UP AND GO:  Was the test performed?  No    Cognitive Function:    02/03/2022    8:15 AM  MMSE - Mini Mental State Exam  Not completed:  Unable to complete        02/13/2023    8:13 AM 02/03/2022    8:15 AM  6CIT Screen  What Year? 0 points 0 points  What month? 0 points 0 points  What time? 0 points 0 points  Count back from 20 0 points 0 points  Months in reverse 0 points 0 points  Repeat phrase 0 points 0 points  Total Score 0 points 0 points    Immunizations Immunization History  Administered Date(s) Administered   Influenza,inj,Quad PF,6+ Mos 02/13/2019, 04/01/2020, 03/07/2022   Influenza,inj,quad, With Preservative 02/27/2018   Influenza-Unspecified 02/27/2018   Moderna Sars-Covid-2 Vaccination 10/06/2019, 11/03/2019   Pneumococcal Polysaccharide-23 07/30/2014   Tdap 03/23/2012    TDAP status: Due, Education has been provided regarding the importance of this vaccine. Advised may receive this vaccine at local pharmacy or Health Dept. Aware to  provide a copy of the vaccination record if obtained from local pharmacy or Health Dept. Verbalized acceptance and understanding.  Flu Vaccine status: Due, Education has been provided regarding the importance of this vaccine. Advised may receive this vaccine at local pharmacy or Health Dept. Aware to provide a copy of the vaccination record if obtained from local pharmacy or Health Dept. Verbalized acceptance and understanding.  Pneumococcal vaccine status: Not age appropriate for this patient.   Covid-19 vaccine status: Information provided on how to obtain vaccines.   Qualifies for Shingles Vaccine? Yes   Zostavax completed No   Shingrix Completed?: No.    Education has been provided regarding the importance of this vaccine. Patient has been advised to call insurance company to determine out of pocket expense if they have not yet received this vaccine. Advised may also receive vaccine at local pharmacy or Health Dept. Verbalized acceptance and understanding.  Screening Tests Health Maintenance  Topic Date Due   Zoster Vaccines- Shingrix (1 of 2) Never done   COVID-19 Vaccine (3 - Moderna risk series) 12/01/2019   DTaP/Tdap/Td (2 - Td or Tdap) 03/23/2022   Diabetic kidney evaluation - Urine ACR  09/02/2022   OPHTHALMOLOGY EXAM  09/08/2022   INFLUENZA VACCINE  12/15/2022   Medicare Annual Wellness (AWV)  02/04/2023   FOOT EXAM  03/08/2023   HEMOGLOBIN A1C  08/08/2023   Lung Cancer Screening  11/04/2023   Diabetic kidney evaluation - eGFR measurement  02/08/2024   Colonoscopy  03/26/2030   Hepatitis C Screening  Completed   HIV Screening  Completed   HPV VACCINES  Aged Out    Health Maintenance  Health Maintenance Due  Topic Date Due   Zoster Vaccines- Shingrix (1 of 2) Never done   COVID-19 Vaccine (3 - Moderna risk series) 12/01/2019   DTaP/Tdap/Td (2 - Td or Tdap) 03/23/2022   Diabetic kidney evaluation - Urine ACR  09/02/2022   OPHTHALMOLOGY EXAM  09/08/2022   INFLUENZA  VACCINE  12/15/2022   Medicare Annual Wellness (AWV)  02/04/2023    Colorectal cancer screening: Type of screening: Colonoscopy. Completed 03/26/2020. Repeat every 5 years  Lung Cancer Screening: (Low Dose CT Chest recommended if Age 70-80 years, 20 pack-year currently smoking OR have quit w/in 15years.) does qualify.   Lung Cancer Screening Referral: completed on 11/04/2022. Due again in 1 year  Additional Screening:  Hepatitis C Screening: does not qualify; Completed 07/22/2020  Vision Screening: Recommended annual ophthalmology exams for early detection of glaucoma and other disorders of the eye. Is the patient up to date with their annual eye exam?  Yes  Who  is the provider or what is the name of the office in which the patient attends annual eye exams? Has an appt 02/13/2023 If pt is not established with a provider, would they like to be referred to a provider to establish care? No .   Dental Screening: Recommended annual dental exams for proper oral hygiene  Diabetic Foot Exam: Diabetic Foot Exam: Completed 03/07/2022  Community Resource Referral / Chronic Care Management: CRR required this visit?  No   CCM required this visit?  No     Plan:     I have personally reviewed and noted the following in the patient's chart:   Medical and social history Use of alcohol, tobacco or illicit drugs  Current medications and supplements including opioid prescriptions. Patient is not currently taking opioid prescriptions. Functional ability and status Nutritional status Physical activity Advanced directives List of other physicians Hospitalizations, surgeries, and ER visits in previous 12 months Vitals Screenings to include cognitive, depression, and falls Referrals and appointments  In addition, I have reviewed and discussed with patient certain preventive protocols, quality metrics, and best practice recommendations. A written personalized care plan for preventive services as  well as general preventive health recommendations were provided to patient.     Jordan Hawks Jessikah Dicker, CMA   02/13/2023   After Visit Summary: (MyChart) Due to this being a telephonic visit, the after visit summary with patients personalized plan was offered to patient via MyChart   Nurse Notes: Patient requesting a prescription for a home blood pressure monitoring kit

## 2023-02-13 NOTE — Telephone Encounter (Signed)
Med Assistance Program forms  Copied Noted Sleeved (gave to provider in their box)

## 2023-02-14 NOTE — Telephone Encounter (Signed)
Forms filled out MD signed Forms faxed to [employer] patient assistance Copied  Forms faxed to charge que Patient advised Forms up front in completed bin

## 2023-02-21 DIAGNOSIS — G4733 Obstructive sleep apnea (adult) (pediatric): Secondary | ICD-10-CM | POA: Diagnosis not present

## 2023-03-01 DIAGNOSIS — M7712 Lateral epicondylitis, left elbow: Secondary | ICD-10-CM | POA: Diagnosis not present

## 2023-03-05 ENCOUNTER — Other Ambulatory Visit: Payer: Self-pay

## 2023-03-05 ENCOUNTER — Ambulatory Visit
Admission: EM | Admit: 2023-03-05 | Discharge: 2023-03-05 | Disposition: A | Discharge status: Home / Self Care | Payer: PPO | Attending: Family Medicine | Admitting: Family Medicine

## 2023-03-05 ENCOUNTER — Ambulatory Visit: Payer: PPO

## 2023-03-05 ENCOUNTER — Encounter: Payer: Self-pay | Admitting: Emergency Medicine

## 2023-03-05 DIAGNOSIS — R0781 Pleurodynia: Secondary | ICD-10-CM

## 2023-03-05 DIAGNOSIS — W19XXXA Unspecified fall, initial encounter: Secondary | ICD-10-CM | POA: Diagnosis not present

## 2023-03-05 DIAGNOSIS — R0789 Other chest pain: Secondary | ICD-10-CM

## 2023-03-05 MED ORDER — HYDROCODONE-ACETAMINOPHEN 5-325 MG PO TABS
1.0000 | ORAL_TABLET | Freq: Three times a day (TID) | ORAL | 0 refills | Status: DC | PRN
Start: 2023-03-05 — End: 2023-10-05

## 2023-03-05 MED ORDER — TIZANIDINE HCL 4 MG PO CAPS: 4.0000 mg | ORAL_CAPSULE | Freq: Three times a day (TID) | ORAL | 0 refills | Status: DC | PRN

## 2023-03-05 NOTE — Discharge Instructions (Signed)
I will call you if your chest x-ray comes back abnormal

## 2023-03-05 NOTE — ED Triage Notes (Addendum)
Pt reports fell approx 3 feet from ladder today while trying to change floodlight. Pt denies hitting head or loc. Pt reports is on eliquis and reports hx of rib fractures on right side.   Pt reports landed on ladder, pt noted to have abrasion to right side of torso. Pt reports tenderness to right lower rib region. Chest expansion symmetrical. Airway patent.

## 2023-03-09 ENCOUNTER — Other Ambulatory Visit: Payer: Self-pay | Admitting: Cardiology

## 2023-03-09 NOTE — ED Provider Notes (Signed)
[facility]    CSN: [id number] Arrival date & time: 03/05/23  1356      History   Chief Complaint Chief Complaint  Patient presents with   Fall    HPI Stephen Roberts is a 57 y.o. male.   Presenting today with right lateral rib pain after falling about 3 feet down off a ladder and landing on this area. Denies CP, SOB, palpitations, head injury or LOC, injuries elsewhere of concern. So far trying OTC pain relievers with minimal relief. Pt is on eliquis for atrial fibrillation.     Past Medical History:  Diagnosis Date   A-fib (HCC)    Arthritis    Diabetes (HCC)    GERD (gastroesophageal reflux disease)    Heart murmur    IDA (iron deficiency anemia)    OSA (obstructive sleep apnea)    moderate obstructive sleep apnea with an AHI of 18.1/h   Pneumonia 06/27/2006    Patient Active Problem List   Diagnosis Date Noted   OSA (obstructive sleep apnea) 11/24/2022   Primary insomnia 11/24/2022   Lung nodule 11/24/2022   Current mild episode of major depressive disorder without prior episode (HCC) 07/13/2022   Screening for lung cancer 07/13/2022   Encounter for general adult medical examination with abnormal findings 03/07/2022   Hiatal hernia 01/12/2022   Persistent atrial fibrillation [facility])    Hospital discharge follow-up 09/01/2021   Atrial fibrillation with RVR  08/24/2021   Essential hypertension 08/24/2021   Iron deficiency anemia 08/24/2021   S/P cervical spinal fusion 06/30/2021   Chronic obstructive pulmonary disease (HCC) 06/30/2021   GERD (gastroesophageal reflux disease) 06/30/2021   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   Recurrent sinus infections 05/04/2020   Iron deficiency anemia due to chronic blood loss    Generalized weakness 03/24/2020   Dysphagia    Hyperlipidemia 02/13/2019   Type 2 diabetes mellitus with other specified complication (HCC) 03/14/2018   Cervical disc disorder with radiculopathy, high cervical region 05/30/2017    Chronic pain syndrome 11/10/2014   Allergic rhinitis 07/30/2014   Allergic dermatitis 07/30/2014   Prostate cancer screening 07/30/2014   Incarcerated epigastric hernia 01/11/2013   Incarcerated umbilical hernia 01/11/2013   Tobacco abuse 01/11/2013    Past Surgical History:  Procedure Laterality Date   ANTERIOR FUSION CERVICAL SPINE     CARDIOVERSION N/A 10/05/2021   Procedure: CARDIOVERSION;  Surgeon: Thurmon Fair, MD;  Location: [facility];  Service: Cardiovascular;  Laterality: N/A;   CARDIOVERSION N/A 11/18/2021   Procedure: CARDIOVERSION;  Surgeon: Thomasene Ripple, DO;  Location: [facility];  Service: Cardiovascular;  Laterality: N/A;   COLONOSCOPY WITH PROPOFOL N/A 03/26/2020   Procedure: COLONOSCOPY WITH PROPOFOL;  Surgeon: Corbin Ade, MD;  Location: [facility];  Service: Endoscopy;  Laterality: N/A;   disc in neck Left 05/2017   ESOPHAGEAL DILATION N/A 03/25/2020   Procedure: ESOPHAGEAL DILATION;  Surgeon: Malissa Hippo, MD;  Location: [facility];  Service: Endoscopy;  Laterality: N/A;   ESOPHAGOGASTRODUODENOSCOPY (EGD) WITH PROPOFOL N/A 03/25/2020   Procedure: ESOPHAGOGASTRODUODENOSCOPY (EGD) WITH PROPOFOL;  Surgeon: Malissa Hippo, MD;  Location: [facility];  Service: Endoscopy;  Laterality: N/A;   GIVENS CAPSULE STUDY N/A 04/13/2020   Procedure: GIVENS CAPSULE STUDY;  Surgeon: Corbin Ade, MD;  Location: [facility];  Service: Endoscopy;  Laterality: N/A;  7:30am   INSERTION OF MESH N/A 01/23/2013   Procedure: INSERTION OF MESH;  Surgeon: Ernestene Mention, MD;  Location:  [facility];  Service: General;  Laterality: N/A;   LEFT HEART CATH AND CORONARY ANGIOGRAPHY N/A 08/27/2021   Procedure: LEFT HEART CATH AND CORONARY ANGIOGRAPHY;  Surgeon: [place], Peter M, MD;  Location: [facility] INVASIVE CV LAB;  Service: Cardiovascular;  Laterality: N/A;   POLYPECTOMY  03/26/2020   Procedure: POLYPECTOMY;  Surgeon: Corbin Ade, MD;  Location: [facility];  Service:  Endoscopy;;   TONSILLECTOMY     as child   VENTRAL HERNIA REPAIR N/A 01/23/2013   Procedure: LAPAROSCOPIC MULTIPLE VENTRAL HERNIAS;  Surgeon: Ernestene Mention, MD;  Location: [facility];  Service: General;  Laterality: N/A;       Home Medications    Prior to Admission medications   Medication Sig Start Date End Date Taking? Authorizing Provider  HYDROcodone-acetaminophen (NORCO/VICODIN) 5-325 MG tablet Take 1 tablet by mouth 3 (three) times daily as needed for moderate pain (pain score 4-6). 03/05/23  Yes Particia Nearing, PA-C  tiZANidine (ZANAFLEX) 4 MG capsule Take 1 capsule (4 mg total) by mouth 3 (three) times daily as needed for muscle spasms. Do not drink alcohol or drive while taking this medication.  May cause drowsiness. 03/05/23  Yes Particia Nearing, PA-C  acetaminophen (TYLENOL) 650 MG CR tablet Take 1,300 mg by mouth every 8 (eight) hours as needed for pain.    [provider]  albuterol (VENTOLIN HFA) 108 (90 Base) MCG/ACT inhaler Inhale 2 puffs into the lungs every 6 (six) hours as needed for wheezing or shortness of breath. 01/23/23   Anabel Halon, MD  apixaban (ELIQUIS) 5 MG TABS tablet Take 1 tablet (5 mg total) by mouth 2 (two) times daily. 04/01/22   Camnitz, Andree Coss, MD  esomeprazole (NEXIUM) 40 MG capsule Take 1 capsule (40 mg total) by mouth daily. 01/23/23   Anabel Halon, MD  Ferrous Sulfate (IRON) 325 (65 Fe) MG TABS Take 1 tablet (325 mg total) by mouth 2 (two) times daily. 01/23/23   Anabel Halon, MD  ferrous sulfate 325 (65 FE) MG EC tablet TAKE 1 TABLET(325 MG) BY MOUTH TWICE DAILY 05/20/22   Anabel Halon, MD  flecainide (TAMBOCOR) 100 MG tablet TAKE 1 TABLET(100 MG) BY MOUTH EVERY 12 HOURS 02/16/22   Camnitz, Will Daphine Deutscher, MD  fluticasone (FLONASE) 50 MCG/ACT nasal spray Place 2 sprays into both nostrils daily. 03/07/22   Anabel Halon, MD  furosemide (LASIX) 20 MG tablet Take 1 tablet (20 mg total) by mouth daily. 09/30/22 09/25/23   Chandrasekhar, Rondel Jumbo, MD  Lactobacillus Rhamnosus, GG, (PROBIOTIC COLIC) LIQD Take 1 tablet by mouth 4 (four) times a week.    [provider]  metFORMIN (GLUCOPHAGE) 1000 MG tablet Take 1 tablet (1,000 mg total) by mouth 2 (two) times daily with a meal. 01/23/23   Anabel Halon, MD  metoprolol succinate (TOPROL-XL) 100 MG 24 hr tablet TAKE 1  BY MOUTH ONCE DAILY 01/17/23   Camnitz, Andree Coss, MD  Multiple Vitamin (MULTIVITAMIN) tablet Take 1 tablet by mouth daily.    [provider]  potassium chloride (KLOR-CON M) 10 MEQ tablet Take 1 tablet by mouth once daily 06/24/22   Camnitz, Andree Coss, MD  pravastatin (PRAVACHOL) 20 MG tablet TAKE 1 TABLET BY MOUTH 3 TIMES EVERY WEEK 01/23/23   Anabel Halon, MD  traZODone (DESYREL) 50 MG tablet Take 0.5-1 tablets (25-50 mg total) by mouth at bedtime as needed for sleep. 11/24/22   Anabel Halon, MD  vitamin B-12 (CYANOCOBALAMIN)  1000 MCG tablet Take 1,000 mcg by mouth daily.    [provider]    Family History Family History  Problem Relation Age of Onset   Depression Mother    Hearing loss Mother    Hyperlipidemia Mother    Hypertension Mother    Stroke Mother    Early death Father    Diabetes Brother    Pancreatic cancer Paternal Uncle    Colon cancer Neg Hx     Social History Social History   Tobacco Use   Smoking status: Every Day    Current packs/day: 0.75    Average packs/day: 0.8 packs/day for 45.3 years (34.0 ttl pk-yrs)    Types: Cigarettes    Start date: 1980   Smokeless tobacco: Never  Vaping Use   Vaping status: Never Used  Substance Use Topics   Alcohol use: Yes    Alcohol/week: 4.0 standard drinks of alcohol    Types: 4 Cans of beer per week    Comment: moderately   Drug use: Not Currently    Types: Cocaine    Comment: states he has tried it all. Last used cocaine a few years ago..     Allergies   Gabapentin, Lyrica [pregabalin], Crestor [rosuvastatin], and Farxiga  [dapagliflozin]   Review of Systems Review of Systems PER HPI  Physical Exam Triage Vital Signs ED Triage Vitals  Encounter Vitals Group     BP 03/05/23 1447 115/77     Systolic BP Percentile --      Diastolic BP Percentile --      Pulse Rate 03/05/23 1447 70     Resp 03/05/23 1447 20     Temp 03/05/23 1447 98.2 F (36.8 C)     Temp Source 03/05/23 1447 Oral     SpO2 03/05/23 1447 95 %     Weight --      Height --      Head Circumference --      Peak Flow --      Pain Score 03/05/23 1445 5     Pain Loc --      Pain Education --      Exclude from Growth Chart --    No data found.  Updated Vital Signs BP 115/77 (BP Location: Right Arm)   Pulse 70   Temp 98.2 F (36.8 C) (Oral)   Resp 20   SpO2 95%   Visual Acuity Right Eye Distance:   Left Eye Distance:   Bilateral Distance:    Right Eye Near:   Left Eye Near:    Bilateral Near:     Physical Exam Vitals and nursing note reviewed.  Constitutional:      Appearance: Normal appearance.  HENT:     Head: Atraumatic.  Eyes:     Extraocular Movements: Extraocular movements intact.     Conjunctiva/sclera: Conjunctivae normal.  Cardiovascular:     Rate and Rhythm: Normal rate and regular rhythm.  Pulmonary:     Effort: Pulmonary effort is normal.     Breath sounds: Normal breath sounds.  Musculoskeletal:        General: Tenderness and signs of injury present. Normal range of motion.     Cervical back: Normal range of motion and neck supple.     Comments: Ttp right lateral rib region, no obvious bony deformities palpable  Skin:    General: Skin is warm and dry.     Findings: Bruising present.     Comments: Linear abrasion vertically down  right lateral flank  Neurological:     General: No focal deficit present.     Mental Status: He is oriented to person, place, and time.     Cranial Nerves: No cranial nerve deficit.     Motor: No weakness.     Gait: Gait normal.  Psychiatric:        Mood and Affect:  Mood normal.        Thought Content: Thought content normal.        Judgment: Judgment normal.      UC Treatments / Results  Labs (all labs ordered are listed, but only abnormal results are displayed) Labs Reviewed - No data to display  EKG   Radiology No results found.  Procedures Procedures (including critical care time)  Medications Ordered in UC Medications - No data to display  Initial Impression / Assessment and Plan / UC Course  I have reviewed the triage vital signs and the nursing notes.  Pertinent labs & imaging results that were available during my care of the patient were reviewed by me and considered in my medical decision making (see chart for details).     X-ray right rib and chest showing possible fracture of 10th rib, nondisplaced. Vitals and exam reassuring, lungs CTAB, O2 saturation WNL on room air and in no acute distress. Discussed norco, zanaflex and supportive OTC medications and home care. Return for worsening sxs.  Final Clinical Impressions(s) / UC Diagnoses   Final diagnoses:  Rib pain on right side  Fall, initial encounter     Discharge Instructions      I will call you if your chest x-ray comes back abnormal    ED Prescriptions     Medication Sig Dispense Auth. Provider   HYDROcodone-acetaminophen (NORCO/VICODIN) 5-325 MG tablet Take 1 tablet by mouth 3 (three) times daily as needed for moderate pain (pain score 4-6). 15 tablet Particia Nearing, [place]   tiZANidine (ZANAFLEX) 4 MG capsule Take 1 capsule (4 mg total) by mouth 3 (three) times daily as needed for muscle spasms. Do not drink alcohol or drive while taking this medication.  May cause drowsiness. 15 capsule Particia Nearing, [place]      I have reviewed the PDMP during this encounter.   Particia Nearing, [place] 03/09/23 2238

## 2023-03-22 ENCOUNTER — Ambulatory Visit: Payer: PPO | Admitting: Cardiology

## 2023-03-27 ENCOUNTER — Other Ambulatory Visit: Payer: Self-pay | Admitting: Cardiology

## 2023-03-27 DIAGNOSIS — I480 Paroxysmal atrial fibrillation: Secondary | ICD-10-CM

## 2023-03-28 NOTE — Telephone Encounter (Signed)
Prescription refill request for Eliquis received. Indication: AF Last office visit: 09/23/22  Carleene Mains MD Scr: 0.76 on 02/08/23  Epic Age: 57 Weight: 106.1kg  Based on above findings Eliquis 5mg  twice daily is the appropriate dose.  Refill approved.

## 2023-03-30 ENCOUNTER — Encounter: Payer: PPO | Admitting: Internal Medicine

## 2023-04-10 ENCOUNTER — Telehealth: Payer: Self-pay | Admitting: Internal Medicine

## 2023-04-10 NOTE — Telephone Encounter (Signed)
   Pre-operative Risk Assessment    Patient Name: Stephen Roberts  DOB: 1966/04/05 MRN: [id number]      Request for Surgical Clearance    Procedure:   Dise   Date of Surgery:  Clearance 04/17/23                                 Surgeon:  Dr. Jadene Pierini  Surgeon's Group or Practice Name: [facility]    Phone number:  [phone] Fax number:  [phone]   Type of Clearance Requested:   - Medical  - Pharmacy:  Hold Apixaban (Eliquis)     Type of Anesthesia:  General    Additional requests/questions:    Signed, April Henson   04/10/2023, 10:14 AM

## 2023-04-10 NOTE — Telephone Encounter (Signed)
Patient with diagnosis of persistent A fib on Eliquis (apixaban) 5 mg PO BID for anticoagulation.    Procedure: drug-induced sleep endoscopy (DISE) Date of procedure: 04/17/23  CHA2DS2-VASc Score = 4   This indicates a 4.8% annual risk of stroke. The patient's score is based upon: CHF History: 1 HTN History: 1 Diabetes History: 1 Stroke History: 0 Vascular Disease History: 1 Age Score: 0 Gender Score: 0      CrCl 161.2 mL/min (actual BW) Platelet count 148 (June 2024)  Per office protocol, patient can hold Eliquis (apixaban) for 1 days prior to procedure.   Patient will not need bridging with Lovenox (enoxaparin) around procedure.  **This guidance is not considered finalized until pre-operative APP has relayed final recommendations.**  Nils Pyle, PharmD PGY1 Pharmacy Resident

## 2023-04-11 NOTE — Telephone Encounter (Signed)
I s/w the pt and offered an appt today for pre op clearance, though pt states he would not be able to make the appt today. I stated that I did not have any other appt's in time for his surgery. Pt states he is going to cancel the surgery for now and would like to keep his appt 06/01/23 with Dr. Izora Ribas. Pt said he will d/w Dr. Izora Ribas for advice about the surgery that is being planned with Dr. Jenne Pane.   I assured the pt that I will send notes to Dr. Jenne Pane office the pt is going to post pone procedure at this time.pt thanked me for calling.

## 2023-04-11 NOTE — Telephone Encounter (Signed)
   Name: DRAYSEN LOWERY  DOB: 05-09-1966  MRN: [id number]  Primary Cardiologist: Christell Constant, MD  Chart reviewed as part of pre-operative protocol coverage. Because of Donnivan Kekahuna [id number] past medical history and time since last visit, he will require a follow-up in-office visit in order to better assess preoperative cardiovascular risk.  Pre-op covering staff: - Please schedule appointment and call patient to inform them. If patient already had an upcoming appointment within acceptable timeframe, please add "pre-op clearance" to the appointment notes so provider is aware. - Please contact requesting surgeon's office via preferred method (i.e, phone, fax) to inform them of need for appointment prior to surgery.  Per office protocol, patient can hold Eliquis (apixaban) for 1 days prior to procedure.  Please resume when medically safe to do so. Patient will not need bridging with Lovenox (enoxaparin) around procedure.    Sharlene Dory, PA-C  04/11/2023, 12:23 PM

## 2023-04-11 NOTE — Telephone Encounter (Signed)
CORRECTION ON SURGEON'S NAME:   CORRECT NAME IS DR. Karren Burly BATES

## 2023-04-12 ENCOUNTER — Ambulatory Visit (INDEPENDENT_AMBULATORY_CARE_PROVIDER_SITE_OTHER): Payer: PPO | Admitting: Internal Medicine

## 2023-04-12 ENCOUNTER — Encounter: Payer: Self-pay | Admitting: Internal Medicine

## 2023-04-12 VITALS — BP 128/89 | HR 81 | Ht 69.0 in | Wt 228.2 lb

## 2023-04-12 DIAGNOSIS — S2241XA Multiple fractures of ribs, right side, initial encounter for closed fracture: Secondary | ICD-10-CM | POA: Insufficient documentation

## 2023-04-12 DIAGNOSIS — I4819 Other persistent atrial fibrillation: Secondary | ICD-10-CM

## 2023-04-12 DIAGNOSIS — Z125 Encounter for screening for malignant neoplasm of prostate: Secondary | ICD-10-CM

## 2023-04-12 DIAGNOSIS — E1169 Type 2 diabetes mellitus with other specified complication: Secondary | ICD-10-CM | POA: Diagnosis not present

## 2023-04-12 DIAGNOSIS — Z0001 Encounter for general adult medical examination with abnormal findings: Secondary | ICD-10-CM

## 2023-04-12 DIAGNOSIS — I10 Essential (primary) hypertension: Secondary | ICD-10-CM

## 2023-04-12 DIAGNOSIS — S2241XD Multiple fractures of ribs, right side, subsequent encounter for fracture with routine healing: Secondary | ICD-10-CM | POA: Diagnosis not present

## 2023-04-12 DIAGNOSIS — J449 Chronic obstructive pulmonary disease, unspecified: Secondary | ICD-10-CM

## 2023-04-12 DIAGNOSIS — E782 Mixed hyperlipidemia: Secondary | ICD-10-CM | POA: Diagnosis not present

## 2023-04-12 NOTE — Assessment & Plan Note (Addendum)
Lab Results  Component Value Date   HGBA1C 8.9 (H) 02/08/2023   Uncontrolled, but improving Associated with HTN, HLD and obesity On Metformin 1000 mg twice daily Recently started Ozempic - now at 0.5 mg qw dose through patient assistance Advised to follow diabetic diet F/u CMP and lipid panel On statin and ARB now Diabetic eye exam: Advised to follow up with Ophthalmology for diabetic eye exam

## 2023-04-12 NOTE — Patient Instructions (Signed)
Please use spirometry for breathing exercises.  Please continue to take medications as prescribed.  Please continue to follow low carb diet and perform moderate exercise/walking at least 150 mins/week.  Please get blood tests done after 1 month.

## 2023-04-12 NOTE — Assessment & Plan Note (Signed)
BP Readings from Last 1 Encounters:  04/12/23 128/89   Well-controlled with metoprolol now Counseled for compliance with the medications Advised DASH diet and moderate exercise/walking, at least 150 mins/week

## 2023-04-12 NOTE — Progress Notes (Addendum)
 Established Patient Office Visit  Subjective:  Patient ID: Stephen Roberts, male    DOB: [birth year]  Age: 57 y.o. MRN: [id number]  CC:  Chief Complaint  Patient presents with   Annual Exam   Fall    Patient fell two months ago, doesn't not feel his injuries are healing     HPI Stephen Roberts is a 57 y.o. male with past medical history of atrial fibrillation,left parotid mass, IDA, type II DM, HLD, chronic neck pain and tobacco abuse who presents for annual physical.  Type II DM: He takes metformin 1000 mg twice daily.  His HbA1c was 8/9 in 09/24.  He has started taking Ozempic, now at 0.5 mg QW dose.  Does not check blood glucose regularly. Denies any polyuria or polyphagia currently. He admits that he needs to improve his diet.   Atrial fibrillation: He is in sinus rhythm now with flecainide.  He takes metoprolol for rate control and Eliquis for [facility].  Followed by cardiology.  He reports intermittent palpitations, for which he takes additional dose of metoprolol, which improves his symptoms.   COPD: He has mild, intermittent dyspnea, for which he uses albuterol inhaler.  Denies any wheezing currently. Denies any fever or chills currently.  He still smokes about 0.3 pack/day and is trying to quit.  MDD: He reports fatigue, increased appetite and sleep disturbance -mostly insomnia now.  He has difficulty maintaining sleep. He reports that he is tired of his medicines, but states that he overall feels better compared to prior.  He denies any SI or HI currently.   He reports chronic neck pain, has history of cervical spinal fusion surgery for cervical  radiculopathy.  He takes Percocet as needed for severe neck pain.  He had a mechanical fall from 2 step ladder and hit the handle of the ladder on the right side, had x-ray of ribs at urgent care, which showed acute 10th rib fracture, and chronic seventh, eighth and ninth rib fracture.  He still has right sided chest wall pain, upon  coughing.   Past Medical History:  Diagnosis Date   A-fib (HCC)    Arthritis    Diabetes (HCC)    GERD (gastroesophageal reflux disease)    Heart murmur    IDA (iron deficiency anemia)    OSA (obstructive sleep apnea)    moderate obstructive sleep apnea with an AHI of 18.1/h   Pneumonia 06/27/2006    Past Surgical History:  Procedure Laterality Date   ANTERIOR FUSION CERVICAL SPINE     CARDIOVERSION N/A 10/05/2021   Procedure: CARDIOVERSION;  Surgeon: Thurmon Fair, MD;  Location: [facility];  Service: Cardiovascular;  Laterality: N/A;   CARDIOVERSION N/A 11/18/2021   Procedure: CARDIOVERSION;  Surgeon: Thomasene Ripple, DO;  Location: [facility];  Service: Cardiovascular;  Laterality: N/A;   COLONOSCOPY WITH PROPOFOL N/A 03/26/2020   Procedure: COLONOSCOPY WITH PROPOFOL;  Surgeon: Corbin Ade, MD;  Location: [facility];  Service: Endoscopy;  Laterality: N/A;   disc in neck Left 05/2017   ESOPHAGEAL DILATION N/A 03/25/2020   Procedure: ESOPHAGEAL DILATION;  Surgeon: Malissa Hippo, MD;  Location: [facility];  Service: Endoscopy;  Laterality: N/A;   ESOPHAGOGASTRODUODENOSCOPY (EGD) WITH PROPOFOL N/A 03/25/2020   Procedure: ESOPHAGOGASTRODUODENOSCOPY (EGD) WITH PROPOFOL;  Surgeon: Malissa Hippo, MD;  Location: [facility];  Service: Endoscopy;  Laterality: N/A;   GIVENS CAPSULE STUDY N/A 04/13/2020   Procedure: GIVENS CAPSULE STUDY;  Surgeon: Corbin Ade, MD;  Location: [facility];  Service: Endoscopy;  Laterality: N/A;  7:30am   INSERTION OF MESH N/A 01/23/2013   Procedure: INSERTION OF MESH;  Surgeon: Ernestene Mention, MD;  Location: [facility];  Service: General;  Laterality: N/A;   LEFT HEART CATH AND CORONARY ANGIOGRAPHY N/A 08/27/2021   Procedure: LEFT HEART CATH AND CORONARY ANGIOGRAPHY;  Surgeon: [place], Peter M, MD;  Location: [facility] INVASIVE CV LAB;  Service: Cardiovascular;  Laterality: N/A;   POLYPECTOMY  03/26/2020   Procedure: POLYPECTOMY;  Surgeon:  Corbin Ade, MD;  Location: [facility];  Service: Endoscopy;;   TONSILLECTOMY     as child   VENTRAL HERNIA REPAIR N/A 01/23/2013   Procedure: LAPAROSCOPIC MULTIPLE VENTRAL HERNIAS;  Surgeon: Ernestene Mention, MD;  Location: [facility];  Service: General;  Laterality: N/A;    Family History  Problem Relation Age of Onset   Depression Mother    Hearing loss Mother    Hyperlipidemia Mother    Hypertension Mother    Stroke Mother    Early death Father    Diabetes Brother    Pancreatic cancer Paternal Uncle    Colon cancer Neg Hx     Social History   Socioeconomic History   Marital status: Married    Spouse name: Not on file   Number of children: Not on file   Years of education: Not on file   Highest education level: Not on file  Occupational History   Not on file  Tobacco Use   Smoking status: Every Day    Current packs/day: 0.75    Average packs/day: 0.7 packs/day for 45.4 years (34.1 ttl pk-yrs)    Types: Cigarettes    Start date: 22   Smokeless tobacco: Never  Vaping Use   Vaping status: Never Used  Substance and Sexual Activity   Alcohol use: Yes    Alcohol/week: 4.0 standard drinks of alcohol    Types: 4 Cans of beer per week    Comment: moderately   Drug use: Not Currently    Types: Cocaine    Comment: states he has tried it all. Last used cocaine a few years ago.Marland Kitchen   Sexual activity: Yes  Other Topics Concern   Not on file  Social History Narrative   Not on file   Social Determinants of Health   Financial Resource Strain: Low Risk  (02/13/2023)   Overall Financial Resource Strain (CARDIA)    Difficulty of Paying Living Expenses: Not hard at all  Food Insecurity: Low Risk  (02/21/2023)   Received from [facility]   Hunger Vital Sign    Worried About Running Out of Food in the Last Year: Never true    Ran Out of Food in the Last Year: Never true  Transportation Needs: No Transportation Needs (02/21/2023)   Received from [employer]    In the past 12 months, has lack of reliable transportation kept you from medical appointments, meetings, work or from getting things needed for daily living? : No  Physical Activity: Insufficiently Active (02/13/2023)   Exercise Vital Sign    Days of Exercise per Week: 5 days    Minutes of Exercise per Session: 20 min  Stress: No Stress Concern Present (02/13/2023)   [employer] of Occupational Health - Occupational Stress Questionnaire    Feeling of Stress : Only a little  Social Connections: Socially Isolated (02/13/2023)   Social Connection and Isolation Panel [NHANES]    Frequency of  Communication with Friends and Family: Never    Frequency of Social Gatherings with Friends and Family: Once a week    Attends Religious Services: Never    Database administrator or Organizations: No    Attends Banker Meetings: Never    Marital Status: Married  Catering manager Violence: Not At Risk (02/13/2023)   Humiliation, Afraid, Rape, and Kick questionnaire    Fear of Current or Ex-Partner: No    Emotionally Abused: No    Physically Abused: No    Sexually Abused: No    Outpatient Medications Prior to Visit  Medication Sig Dispense Refill   Semaglutide,0.25 or 0.5MG /DOS, (OZEMPIC, 0.25 OR 0.5 MG/DOSE,) 2 MG/1.5ML SOPN Inject 0.5 mg into the skin every 7 (seven) days. Through patient assistance.     acetaminophen (TYLENOL) 650 MG CR tablet Take 1,300 mg by mouth every 8 (eight) hours as needed for pain.     albuterol (VENTOLIN HFA) 108 (90 Base) MCG/ACT inhaler Inhale 2 puffs into the lungs every 6 (six) hours as needed for wheezing or shortness of breath. 18 g 2   ELIQUIS 5 MG TABS tablet Take 1 tablet by mouth twice daily 60 tablet 0   esomeprazole (NEXIUM) 40 MG capsule Take 1 capsule (40 mg total) by mouth daily. 90 capsule 3   Ferrous Sulfate (IRON) 325 (65 Fe) MG TABS Take 1 tablet (325 mg total) by mouth 2 (two) times daily. 240 tablet 0   ferrous sulfate  325 (65 FE) MG EC tablet TAKE 1 TABLET(325 MG) BY MOUTH TWICE DAILY 60 tablet 3   flecainide (TAMBOCOR) 100 MG tablet TAKE 1 TABLET BY MOUTH EVERY 12 HOURS 60 tablet 0   fluticasone (FLONASE) 50 MCG/ACT nasal spray Place 2 sprays into both nostrils daily. 16 g 6   furosemide (LASIX) 20 MG tablet Take 1 tablet (20 mg total) by mouth daily. 90 tablet 3   HYDROcodone-acetaminophen (NORCO/VICODIN) 5-325 MG tablet Take 1 tablet by mouth 3 (three) times daily as needed for moderate pain (pain score 4-6). 15 tablet 0   Lactobacillus Rhamnosus, GG, (PROBIOTIC COLIC) LIQD Take 1 tablet by mouth 4 (four) times a week.     metFORMIN (GLUCOPHAGE) 1000 MG tablet Take 1 tablet (1,000 mg total) by mouth 2 (two) times daily with a meal. 60 tablet 5   metoprolol succinate (TOPROL-XL) 100 MG 24 hr tablet TAKE 1  BY MOUTH ONCE DAILY 90 tablet 1   Multiple Vitamin (MULTIVITAMIN) tablet Take 1 tablet by mouth daily.     potassium chloride (KLOR-CON M) 10 MEQ tablet Take 1 tablet by mouth once daily 90 tablet 3   pravastatin (PRAVACHOL) 20 MG tablet TAKE 1 TABLET BY MOUTH 3 TIMES EVERY WEEK 15 tablet 10   tiZANidine (ZANAFLEX) 4 MG capsule Take 1 capsule (4 mg total) by mouth 3 (three) times daily as needed for muscle spasms. Do not drink alcohol or drive while taking this medication.  May cause drowsiness. 15 capsule 0   traZODone (DESYREL) 50 MG tablet Take 0.5-1 tablets (25-50 mg total) by mouth at bedtime as needed for sleep. 30 tablet 3   vitamin B-12 (CYANOCOBALAMIN) 1000 MCG tablet Take 1,000 mcg by mouth daily.     Facility-Administered Medications Prior to Visit  Medication Dose Route Frequency Provider Last Rate Last Admin   iron sucrose (VENOFER) 200 mg in sodium chloride 0.9 % 100 mL IVPB  200 mg Intravenous Once Pennington, Rebekah M, PA-C  Allergies  Allergen Reactions   Gabapentin Shortness Of Breath   Lyrica [Pregabalin] Shortness Of Breath   Crestor [Rosuvastatin] Other (See Comments)     myalgia   Farxiga [Dapagliflozin]     Developed UTI    ROS Review of Systems  Constitutional:  Negative for chills and fever.  HENT:  Negative for congestion, sinus pressure, sinus pain and sore throat.   Eyes:  Negative for pain and discharge.  Respiratory:  Positive for shortness of breath. Negative for cough.        Right sided chest wall pain  Cardiovascular:  Positive for palpitations. Negative for chest pain.  Gastrointestinal:  Negative for diarrhea, nausea and vomiting.  Endocrine: Negative for polydipsia and polyuria.  Genitourinary:  Negative for dysuria and hematuria.  Musculoskeletal:  Positive for back pain and neck pain. Negative for neck stiffness.  Skin:  Negative for rash.  Neurological:  Negative for dizziness, weakness and numbness.  Psychiatric/Behavioral:  Positive for sleep disturbance. Negative for agitation and behavioral problems.       Objective:    Physical Exam Vitals reviewed.  Constitutional:      General: He is not in acute distress.    Appearance: He is not diaphoretic.  HENT:     Head: Normocephalic and atraumatic.     Nose: No congestion.     Mouth/Throat:     Mouth: Mucous membranes are moist.  Eyes:     General: No scleral icterus.    Extraocular Movements: Extraocular movements intact.  Neck:     Vascular: No carotid bruit.  Cardiovascular:     Rate and Rhythm: Normal rate and regular rhythm.     Heart sounds: Normal heart sounds. No murmur heard. Pulmonary:     Breath sounds: Normal breath sounds. No wheezing or rales.  Chest:     Chest wall: Tenderness (Right-sided, around 9th-10th ribs) present.  Abdominal:     Palpations: Abdomen is soft.     Tenderness: There is no abdominal tenderness.  Musculoskeletal:     Cervical back: Neck supple. No tenderness.     Right lower leg: No edema.     Left lower leg: No edema.  Skin:    General: Skin is warm.     Findings: No rash.  Neurological:     General: No focal deficit present.      Mental Status: He is alert and oriented to person, place, and time.     Sensory: No sensory deficit.     Motor: No weakness.  Psychiatric:        Mood and Affect: Mood normal.        Behavior: Behavior normal.     BP 128/89 (BP Location: Right Arm, Patient Position: Sitting, Cuff Size: Large)   Pulse 81   Ht 5\' 9"  (1.753 m)   Wt 228 lb 3.2 oz (103.5 kg)   SpO2 94%   BMI 33.70 kg/m  Wt Readings from Last 3 Encounters:  04/12/23 228 lb 3.2 oz (103.5 kg)  02/13/23 240 lb (108.9 kg)  12/23/22 238 lb (108 kg)    Lab Results  Component Value Date   TSH 1.910 03/08/2022   Lab Results  Component Value Date   WBC 7.8 10/27/2022   HGB 15.3 10/27/2022   HCT 46.4 10/27/2022   MCV 102.0 (H) 10/27/2022   PLT 148 (L) 10/27/2022   Lab Results  Component Value Date   NA 136 02/08/2023   K 4.6 02/08/2023   CO2 24  02/08/2023   GLUCOSE 161 (H) 02/08/2023   BUN 15 02/08/2023   CREATININE 0.76 02/08/2023   BILITOT 0.3 02/08/2023   ALKPHOS 125 (H) 02/08/2023   AST 18 02/08/2023   ALT 18 02/08/2023   PROT 7.4 02/08/2023   ALBUMIN 4.2 02/08/2023   CALCIUM 9.7 02/08/2023   ANIONGAP 15 11/17/2021   EGFR 104 02/08/2023   Lab Results  Component Value Date   CHOL 162 02/08/2023   Lab Results  Component Value Date   HDL 34 (L) 02/08/2023   Lab Results  Component Value Date   LDLCALC 88 02/08/2023   Lab Results  Component Value Date   TRIG 239 (H) 02/08/2023   Lab Results  Component Value Date   CHOLHDL 4.8 02/08/2023   Lab Results  Component Value Date   HGBA1C 8.9 (H) 02/08/2023      Assessment & Plan:   Problem List Items Addressed This Visit       Cardiovascular and Mediastinum   Essential hypertension    BP Readings from Last 1 Encounters:  04/12/23 128/89   Well-controlled with metoprolol now Counseled for compliance with the medications Advised DASH diet and moderate exercise/walking, at least 150 mins/week      Persistent atrial fibrillation  (HCC)    Rate controlled with metoprolol now On flecainide 100 mg daily In sinus rhythm now On Eliquis for [facility] Follow-up with cardiology S/p cardioversion      Relevant Orders   CBC with Differential/Platelet   CMP14+EGFR     Respiratory   Chronic obstructive pulmonary disease (HCC)    Well-controlled Uses albuterol as needed for dyspnea or wheezing, does not need it daily If frequent use needed, will add maintenance inhaler for COPD        Endocrine   Type 2 diabetes mellitus with other specified complication (HCC)    Lab Results  Component Value Date   HGBA1C 8.9 (H) 02/08/2023   Uncontrolled, but improving Associated with HTN, HLD and obesity On Metformin 1000 mg twice daily Recently started Ozempic - now at 0.5 mg qw dose through patient assistance Advised to follow diabetic diet F/u CMP and lipid panel On statin and ARB now Diabetic eye exam: Advised to follow up with Ophthalmology for diabetic eye exam      Relevant Medications   Semaglutide,0.25 or 0.5MG /DOS, (OZEMPIC, 0.25 OR 0.5 MG/DOSE,) 2 MG/1.5ML SOPN   Other Relevant Orders   CMP14+EGFR   Hemoglobin A1c     Musculoskeletal and Integument   Multiple closed fractures of ribs of right side    Due to of fall from ladder at home Has acute 10th rib fracture on right side noted on x-ray of ribs, also has chronic fractures of seventh, eighth and ninth ribs Conservatively manage for now with pain control, has Norco as needed for severe pain Advised to use spirometry for breathing exercises        Other   Prostate cancer screening    Ordered PSA after discussing its limitations for prostate cancer screening, including false positive results leading additional investigations.      Relevant Orders   PSA   Hyperlipidemia    On Pravastatin QOD now Did not tolerate Crestor      Relevant Orders   Lipid Profile   Encounter for general adult medical examination with abnormal findings - Primary    Physical  exam as documented. Fasting blood tests ordered. Flu and COVID vaccines at pharmacy.       No orders  of the defined types were placed in this encounter.   Follow-up: Return in about 4 months (around 08/10/2023) for DM and HTN.    Anabel Halon, MD

## 2023-04-12 NOTE — Assessment & Plan Note (Signed)
Rate controlled with metoprolol now On flecainide 100 mg daily In sinus rhythm now On Eliquis for [facility] Follow-up with cardiology S/p cardioversion

## 2023-04-12 NOTE — Assessment & Plan Note (Addendum)
Physical exam as documented. Fasting blood tests ordered. Flu and COVID vaccines at pharmacy.

## 2023-04-12 NOTE — Assessment & Plan Note (Signed)
On Pravastatin QOD now Did not tolerate Crestor

## 2023-04-12 NOTE — Assessment & Plan Note (Signed)
Well-controlled Uses albuterol as needed for dyspnea or wheezing, does not need it daily If frequent use needed, will add maintenance inhaler for COPD

## 2023-04-12 NOTE — Assessment & Plan Note (Signed)
Due to of fall from ladder at home Has acute 10th rib fracture on right side noted on x-ray of ribs, also has chronic fractures of seventh, eighth and ninth ribs Conservatively manage for now with pain control, has Norco as needed for severe pain Advised to use spirometry for breathing exercises

## 2023-04-12 NOTE — Assessment & Plan Note (Signed)
Ordered PSA after discussing its limitations for prostate cancer screening, including false positive results leading additional investigations.

## 2023-04-14 ENCOUNTER — Other Ambulatory Visit: Payer: Self-pay | Admitting: Cardiology

## 2023-04-19 ENCOUNTER — Encounter: Payer: Self-pay | Admitting: Internal Medicine

## 2023-04-19 NOTE — Telephone Encounter (Signed)
 Care team updated and letter sent for eye exam notes.

## 2023-04-21 ENCOUNTER — Emergency Department (HOSPITAL_COMMUNITY)
Admission: EM | Admit: 2023-04-21 | Discharge: 2023-04-21 | Disposition: A | Discharge status: Home / Self Care | Payer: PPO | Attending: Emergency Medicine | Admitting: Emergency Medicine

## 2023-04-21 ENCOUNTER — Emergency Department (HOSPITAL_COMMUNITY): Payer: PPO

## 2023-04-21 ENCOUNTER — Telehealth: Payer: Self-pay | Admitting: Cardiology

## 2023-04-21 ENCOUNTER — Other Ambulatory Visit: Payer: Self-pay

## 2023-04-21 ENCOUNTER — Ambulatory Visit
Admission: RE | Admit: 2023-04-21 | Discharge: 2023-04-21 | Disposition: A | Discharge status: Home / Self Care | Payer: PPO | Source: Ambulatory Visit | Attending: Nurse Practitioner | Admitting: Nurse Practitioner

## 2023-04-21 ENCOUNTER — Encounter (HOSPITAL_COMMUNITY): Payer: Self-pay | Admitting: *Deleted

## 2023-04-21 VITALS — BP 133/90 | HR 71 | Temp 97.9°F | Resp 20

## 2023-04-21 DIAGNOSIS — J984 Other disorders of lung: Secondary | ICD-10-CM | POA: Diagnosis not present

## 2023-04-21 DIAGNOSIS — R002 Palpitations: Secondary | ICD-10-CM

## 2023-04-21 DIAGNOSIS — Z20822 Contact with and (suspected) exposure to covid-19: Secondary | ICD-10-CM | POA: Insufficient documentation

## 2023-04-21 DIAGNOSIS — Z7901 Long term (current) use of anticoagulants: Secondary | ICD-10-CM | POA: Insufficient documentation

## 2023-04-21 DIAGNOSIS — R918 Other nonspecific abnormal finding of lung field: Secondary | ICD-10-CM | POA: Diagnosis not present

## 2023-04-21 DIAGNOSIS — I4891 Unspecified atrial fibrillation: Secondary | ICD-10-CM

## 2023-04-21 DIAGNOSIS — R590 Localized enlarged lymph nodes: Secondary | ICD-10-CM | POA: Diagnosis not present

## 2023-04-21 DIAGNOSIS — R0602 Shortness of breath: Secondary | ICD-10-CM | POA: Diagnosis not present

## 2023-04-21 DIAGNOSIS — J168 Pneumonia due to other specified infectious organisms: Secondary | ICD-10-CM | POA: Diagnosis not present

## 2023-04-21 DIAGNOSIS — J189 Pneumonia, unspecified organism: Secondary | ICD-10-CM | POA: Diagnosis not present

## 2023-04-21 DIAGNOSIS — R0789 Other chest pain: Secondary | ICD-10-CM | POA: Diagnosis not present

## 2023-04-21 LAB — BASIC METABOLIC PANEL
Anion gap: 12 (ref 5–15)
BUN: 18 mg/dL (ref 6–20)
CO2: 23 mmol/L (ref 22–32)
Calcium: 9.5 mg/dL (ref 8.9–10.3)
Chloride: 99 mmol/L (ref 98–111)
Creatinine, Ser: 0.76 mg/dL (ref 0.61–1.24)
GFR, Estimated: >60 mL/min (ref >60)
Glucose, Bld: 177 mg/dL — ABNORMAL HIGH (ref 70–99)
Potassium: 4 mmol/L (ref 3.5–5.1)
Sodium: 134 mmol/L — ABNORMAL LOW (ref 135–145)

## 2023-04-21 LAB — RESP PANEL BY RT-PCR (RSV, FLU A&B, COVID)  RVPGX2
Influenza A by PCR: NEGATIVE
Influenza B by PCR: NEGATIVE
Resp Syncytial Virus by PCR: NEGATIVE
SARS Coronavirus 2 by RT PCR: NEGATIVE

## 2023-04-21 LAB — CBC
HCT: 45.4 % (ref 39.0–52.0)
Hemoglobin: 15 g/dL (ref 13.0–17.0)
MCH: 32.9 pg (ref 26.0–34.0)
MCHC: 33 g/dL (ref 30.0–36.0)
MCV: 99.6 fL (ref 80.0–100.0)
Platelets: 148 10*3/uL — ABNORMAL LOW (ref 150–400)
RBC: 4.56 MIL/uL (ref 4.22–5.81)
RDW: 13.2 % (ref 11.5–15.5)
WBC: 10.3 10*3/uL (ref 4.0–10.5)
nRBC: 0 % (ref 0.0–0.2)

## 2023-04-21 LAB — MAGNESIUM: Magnesium: 1.5 mg/dL — ABNORMAL LOW (ref 1.7–2.4)

## 2023-04-21 LAB — TROPONIN I (HIGH SENSITIVITY)
Troponin I (High Sensitivity): 6 ng/L (ref <18)
Troponin I (High Sensitivity): 6 ng/L (ref <18)

## 2023-04-21 MED ORDER — DOXYCYCLINE HYCLATE 100 MG PO CAPS: 100.0000 mg | ORAL_CAPSULE | Freq: Two times a day (BID) | ORAL | 0 refills | Status: DC

## 2023-04-21 MED ORDER — DOXYCYCLINE HYCLATE 100 MG IV SOLR
100.0000 mg | INTRAVENOUS | Status: DC
  Filled 2023-04-21: qty 100

## 2023-04-21 MED ORDER — MAGNESIUM SULFATE 2 GM/50ML IV SOLN
2.0000 g | Freq: Once | INTRAVENOUS | Status: AC
Start: 2023-04-21 — End: 2023-04-21
  Administered 2023-04-21: 2 g via INTRAVENOUS
  Filled 2023-04-21: qty 50

## 2023-04-21 MED ORDER — ETOMIDATE 2 MG/ML IV SOLN
10.0000 mg | Freq: Once | INTRAVENOUS | Status: AC
Start: 2023-04-21 — End: 2023-04-21

## 2023-04-21 MED ORDER — DOXYCYCLINE HYCLATE 100 MG PO TABS
100.0000 mg | ORAL_TABLET | Freq: Once | ORAL | Status: AC
  Administered 2023-04-21: 100 mg via ORAL
  Filled 2023-04-21: qty 1

## 2023-04-21 MED ORDER — AMOXICILLIN-POT CLAVULANATE 875-125 MG PO TABS: 1.0000 | ORAL_TABLET | Freq: Two times a day (BID) | ORAL | 0 refills | Status: DC

## 2023-04-21 MED ORDER — SODIUM CHLORIDE 0.9 % IV SOLN
2.0000 g | Freq: Once | INTRAVENOUS | Status: AC
  Administered 2023-04-21: 2 g via INTRAVENOUS
  Filled 2023-04-21: qty 20

## 2023-04-21 MED ORDER — ETOMIDATE 2 MG/ML IV SOLN
5.0000 mg | Freq: Once | INTRAVENOUS | Status: AC
  Administered 2023-04-21: 5 mg via INTRAVENOUS

## 2023-04-21 MED ORDER — IOHEXOL 300 MG/ML  SOLN
80.0000 mL | Freq: Once | INTRAMUSCULAR | Status: AC | PRN
  Administered 2023-04-21: 80 mL via INTRAVENOUS

## 2023-04-21 MED ORDER — ETOMIDATE 2 MG/ML IV SOLN
INTRAVENOUS | Status: AC
  Administered 2023-04-21: 10 mg via INTRAVENOUS
  Filled 2023-04-21: qty 10

## 2023-04-21 NOTE — ED Provider Notes (Signed)
[place] EMERGENCY DEPARTMENT AT [facility] Provider Note   CSN: [id number] Arrival date & time: 04/21/23  1014     History  Chief Complaint  Patient presents with   Chest Pain    Stephen Roberts is a 57 y.o. male.  57 year old male with a history of atrial fibrillation on flecainide and Eliquis who presents to the emergency department with fatigue, palpitations, fever, cough, and shortness of breath.  Since Sunday patient has been feeling under the weather.  Says that he has had fever and chills.  Tmax 102F.  Also has been having cough and congestion.  Says that he has been feeling intermittent palpitations since earlier in the week.  Typically is in normal sinus rhythm but has been checking his heart monitor which has been telling him he is in A-fib.  Has been on Eliquis for years.  Requesting cardioversion at this time because he feels like the A-fib is limiting his exercise tolerance.  N.p.o. since 6:30 AM this morning.  Smokes cigarettes.       Home Medications Prior to Admission medications   Medication Sig Start Date End Date Taking? Authorizing Provider  acetaminophen (TYLENOL) 650 MG CR tablet Take 1,300 mg by mouth every 8 (eight) hours as needed for pain.   Yes [provider]  albuterol (VENTOLIN HFA) 108 (90 Base) MCG/ACT inhaler Inhale 2 puffs into the lungs every 6 (six) hours as needed for wheezing or shortness of breath. 01/23/23  Yes Anabel Halon, MD  amoxicillin-clavulanate (AUGMENTIN) 875-125 MG tablet Take 1 tablet by mouth every 12 (twelve) hours. 04/21/23  Yes Rondel Baton, MD  doxycycline (VIBRAMYCIN) 100 MG capsule Take 1 capsule (100 mg total) by mouth 2 (two) times daily. 04/21/23  Yes Rondel Baton, MD  ELIQUIS 5 MG TABS tablet Take 1 tablet by mouth twice daily 03/28/23  Yes Chandrasekhar, Mahesh A, MD  esomeprazole (NEXIUM) 40 MG capsule Take 1 capsule (40 mg total) by mouth daily. 01/23/23  Yes Anabel Halon, MD  Ferrous  Sulfate (IRON) 325 (65 Fe) MG TABS Take 1 tablet (325 mg total) by mouth 2 (two) times daily. 01/23/23  Yes Anabel Halon, MD  ferrous sulfate 325 (65 FE) MG EC tablet TAKE 1 TABLET(325 MG) BY MOUTH TWICE DAILY 05/20/22  Yes Anabel Halon, MD  flecainide (TAMBOCOR) 100 MG tablet TAKE 1 TABLET BY MOUTH EVERY 12 HOURS 04/18/23  Yes Camnitz, Will Daphine Deutscher, MD  fluticasone (FLONASE) 50 MCG/ACT nasal spray Place 2 sprays into both nostrils daily. 03/07/22  Yes Anabel Halon, MD  furosemide (LASIX) 20 MG tablet Take 1 tablet (20 mg total) by mouth daily. 09/30/22 09/25/23 Yes Chandrasekhar, Mahesh A, MD  HYDROcodone-acetaminophen (NORCO/VICODIN) 5-325 MG tablet Take 1 tablet by mouth 3 (three) times daily as needed for moderate pain (pain score 4-6). 03/05/23  Yes Particia Nearing, PA-C  Lactobacillus Rhamnosus, GG, (PROBIOTIC COLIC) LIQD Take 1 tablet by mouth 4 (four) times a week.   Yes [provider]  metFORMIN (GLUCOPHAGE) 1000 MG tablet Take 1 tablet (1,000 mg total) by mouth 2 (two) times daily with a meal. 01/23/23  Yes Anabel Halon, MD  metoprolol succinate (TOPROL-XL) 100 MG 24 hr tablet TAKE 1  BY MOUTH ONCE DAILY 01/17/23  Yes Camnitz, Andree Coss, MD  Multiple Vitamin (MULTIVITAMIN) tablet Take 1 tablet by mouth daily.   Yes [provider]  potassium chloride (KLOR-CON M) 10 MEQ tablet Take 1 tablet by mouth  once daily 06/24/22  Yes Camnitz, Will Daphine Deutscher, MD  pravastatin (PRAVACHOL) 20 MG tablet TAKE 1 TABLET BY MOUTH 3 TIMES EVERY WEEK 01/23/23  Yes Anabel Halon, MD  Semaglutide,0.25 or 0.5MG /DOS, (OZEMPIC, 0.25 OR 0.5 MG/DOSE,) 2 MG/1.5ML SOPN Inject 0.5 mg into the skin every 7 (seven) days. Through patient assistance.   Yes [provider]  vitamin B-12 (CYANOCOBALAMIN) 1000 MCG tablet Take 1,000 mcg by mouth daily.   Yes [provider]  tiZANidine (ZANAFLEX) 4 MG capsule Take 1 capsule (4 mg total) by mouth 3 (three) times daily as needed for muscle  spasms. Do not drink alcohol or drive while taking this medication.  May cause drowsiness. Patient not taking: Reported on 04/21/2023 03/05/23   Particia Nearing, PA-C  traZODone (DESYREL) 50 MG tablet Take 0.5-1 tablets (25-50 mg total) by mouth at bedtime as needed for sleep. Patient not taking: Reported on 04/21/2023 11/24/22   Anabel Halon, MD      Allergies    Gabapentin, Lyrica [pregabalin], Crestor [rosuvastatin], and Farxiga [dapagliflozin]    Review of Systems   Review of Systems  Physical Exam Updated Vital Signs BP 122/80   Pulse (!) 59   Temp 98.5 F (36.9 C) (Oral)   Resp 18   Ht 5\' 9"  (1.753 m)   Wt 103.5 kg   SpO2 97%   BMI 33.70 kg/m  Physical Exam Vitals and nursing note reviewed.  Constitutional:      General: He is not in acute distress.    Appearance: He is well-developed.  HENT:     Head: Normocephalic and atraumatic.     Right Ear: External ear normal.     Left Ear: External ear normal.     Nose: Nose normal.  Eyes:     Extraocular Movements: Extraocular movements intact.     Conjunctiva/sclera: Conjunctivae normal.     Pupils: Pupils are equal, round, and reactive to light.  Cardiovascular:     Rate and Rhythm: Normal rate. Rhythm irregular.     Heart sounds: Normal heart sounds.  Pulmonary:     Effort: Pulmonary effort is normal. No respiratory distress.     Breath sounds: Normal breath sounds.  Musculoskeletal:     Cervical back: Normal range of motion and neck supple.     Right lower leg: No edema.     Left lower leg: No edema.  Skin:    General: Skin is warm and dry.  Neurological:     Mental Status: He is alert. Mental status is at baseline.  Psychiatric:        Mood and Affect: Mood normal.        Behavior: Behavior normal.     ED Results / Procedures / Treatments   Labs (all labs ordered are listed, but only abnormal results are displayed) Labs Reviewed  BASIC METABOLIC PANEL - Abnormal; Notable for the following  components:      Result Value   Sodium 134 (*)    Glucose, Bld 177 (*)    All other components within normal limits  CBC - Abnormal; Notable for the following components:   Platelets 148 (*)    All other components within normal limits  MAGNESIUM - Abnormal; Notable for the following components:   Magnesium 1.5 (*)    All other components within normal limits  RESP PANEL BY RT-PCR (RSV, FLU A&B, COVID)  [id number]  TROPONIN I (HIGH SENSITIVITY)  TROPONIN I (HIGH SENSITIVITY)    EKG  None  Radiology CT Chest W Contrast  Result Date: 04/21/2023 CLINICAL DATA:  Fever. Evaluate consolidation versus mass identified on chest radiograph. EXAM: CT CHEST WITH CONTRAST TECHNIQUE: Multidetector CT imaging of the chest was performed during intravenous contrast administration. RADIATION DOSE REDUCTION: This exam was performed according to the departmental dose-optimization program which includes automated exposure control, adjustment of the mA and/or kV according to patient size and/or use of iterative reconstruction technique. CONTRAST:  80mL OMNIPAQUE IOHEXOL 300 MG/ML  SOLN COMPARISON:  11/04/2022 FINDINGS: Cardiovascular: Heart size appears within normal limits. No pericardial effusion. Aortic atherosclerosis and coronary artery calcifications. Mediastinum/Nodes: Thyroid gland, trachea, and esophagus appear normal. No enlarged axillary, supraclavicular, mediastinal lymph nodes. Right hilar lymph node measures 1.4 cm, image 69/2. Previously 1 cm. Lungs/Pleura: No pleural effusion. Within the perihilar right upper lobe, right middle lobe and superior segment of right lower lobe there is ground-glass and airspace consolidation with multiple surrounding part solid nodules in the central right upper, middle and right lower lobes. The left lung appears clear. The central airways are patent. Upper Abdomen: No acute abnormality. Moderate to large hiatal hernia. Nodule within the right adrenal gland measures 1.1 x  1.5 cm, image 140/2. This is unchanged when compared with the previous exam and is compatible with a benign adenoma. No follow-up imaging recommended. Musculoskeletal: No chest wall abnormality. No acute or significant osseous findings. IMPRESSION: 1. There is ground-glass and airspace consolidation with multiple surrounding part solid nodules in the central right upper, middle and right lower lobes. Findings are compatible with multifocal pneumonia. Recommend follow-up imaging with repeat CT of the chest in 1-3 months to ensure complete resolution and to exclude underlying malignancy. 2. Mild right hilar lymphadenopathy, likely reactive. 3. Moderate to large hiatal hernia. 4. Coronary artery calcifications. 5.  Aortic Atherosclerosis (ICD10-I70.0). Electronically Signed   By: Signa Kell M.D.   On: 04/21/2023 15:38   DG Chest 2 View  Result Date: 04/21/2023 CLINICAL DATA:  Chest pressure. EXAM: CHEST - 2 VIEW COMPARISON:  03/05/2023. FINDINGS: There is new opacity in the right hilar region, which may represent consolidation versus lung mass in the upper portion of right lower lobe. Further evaluation with chest CT scan is recommended. Bilateral lung fields are otherwise clear. Bilateral costophrenic angles are clear. Normal cardio-mediastinal silhouette. No acute osseous abnormalities. Cervicothoracic spinal fixation hardware seen. The soft tissues are within normal limits. IMPRESSION: *There is new opacity in the right hilar region, which may represent consolidation versus lung mass in the upper portion of right lower lobe. Further evaluation with chest CT scan is recommended. Electronically Signed   By: Jules Schick M.D.   On: 04/21/2023 11:30    Procedures .Cardioversion  Date/Time: 04/21/2023 9:26 PM  Performed by: Rondel Baton, MD Authorized by: Rondel Baton, MD   Consent:    Consent obtained:  Written   Consent given by:  Patient   Risks discussed:  Cutaneous burn, death,  induced arrhythmia and pain   Alternatives discussed:  Rate-control medication, no treatment and delayed treatment Pre-procedure details:    Cardioversion basis:  Emergent   Rhythm:  Atrial fibrillation   Electrode placement:  Anterior-posterior Patient sedated: Yes. Refer to sedation procedure documentation for details of sedation.  Attempt one:    Cardioversion mode:  Synchronous   Shock (Joules):  120   Shock outcome:  Conversion to normal sinus rhythm Post-procedure details:    Patient status:  Awake   Patient tolerance of procedure:  Tolerated well,  no immediate complications .Sedation  Date/Time: 04/21/2023 1:40 PM  Performed by: Rondel Baton, MD Authorized by: Rondel Baton, MD   Consent:    Consent obtained:  Written   Consent given by:  Patient   Risks discussed:  Allergic reaction, prolonged hypoxia resulting in organ damage, dysrhythmia, prolonged sedation necessitating reversal and inadequate sedation Universal protocol:    Immediately prior to procedure, a time out was called: yes   Indications:    Procedure performed:  Cardioversion   Procedure necessitating sedation performed by:  Physician performing sedation Pre-sedation assessment:    Time since last food or drink:  6   ASA classification: class 2 - patient with mild systemic disease     Mouth opening:  3 or more finger widths   Thyromental distance:  3 finger widths   Mallampati score:  III - soft palate, base of uvula visible   Neck mobility: normal     Pre-sedation assessments completed and reviewed: pre-procedure airway patency not reviewed, pre-procedure cardiovascular function not reviewed, pre-procedure mental status not reviewed and pre-procedure respiratory function not reviewed   A pre-sedation assessment was completed prior to the start of the procedure Immediate pre-procedure details:    Reassessment: Patient reassessed immediately prior to procedure     Verified: bag valve mask  available, emergency equipment available, intubation equipment available, IV patency confirmed and oxygen available   Procedure details (see MAR for exact dosages):    Preoxygenation:  Nasal cannula   Sedation:  Etomidate   Intended level of sedation: deep   Intra-procedure monitoring:  Blood pressure monitoring, continuous capnometry and frequent LOC assessments   Intra-procedure events: none     Total Provider sedation time (minutes):  15 Post-procedure details:   A post-sedation assessment was completed following the completion of the procedure.   Attendance: Constant attendance by certified staff until patient recovered     Recovery: Patient returned to pre-procedure baseline     Post-sedation assessments completed and reviewed: post-procedure airway patency not reviewed, post-procedure cardiovascular function not reviewed, post-procedure mental status not reviewed and post-procedure respiratory function not reviewed     Patient is stable for discharge or admission: yes     Procedure completion:  Tolerated well, no immediate complications     Medications Ordered in ED Medications  cefTRIAXone (ROCEPHIN) 2 g in sodium chloride 0.9 % 100 mL IVPB (0 g Intravenous Stopped 04/21/23 1350)  magnesium sulfate IVPB 2 g 50 mL (2 g Intravenous New Bag/Given 04/21/23 1359)  doxycycline (VIBRA-TABS) tablet 100 mg (100 mg Oral Given 04/21/23 1425)  etomidate (AMIDATE) injection 5 mg (5 mg Intravenous Given 04/21/23 1342)  etomidate (AMIDATE) injection 10 mg (10 mg Intravenous Given 04/21/23 1340)  iohexol (OMNIPAQUE) 300 MG/ML solution 80 mL (80 mLs Intravenous Contrast Given 04/21/23 1413)    ED Course/ Medical Decision Making/ A&P                                 Medical Decision Making Amount and/or Complexity of Data Reviewed Labs: ordered. Radiology: ordered.  Risk Prescription drug management.   Stephen Roberts is a 57 y.o. male with comorbidities that complicate the patient evaluation  including atrial fibrillation on flecainide and Eliquis who presents to the emergency department with fatigue, palpitations, fever, cough, and shortness of breath.   Initial Ddx:  URI, pneumonia, atrial fibrillation, MI, PE  MDM/Course:  Patient presents to the emergency department with  palpitations as well as fever, cough, and shortness of breath.  On arrival appears to be in atrial fibrillation that was rate controlled.  Having occasional PVCs as well.  Afebrile here.  Chest x-ray shows possible pneumonia and they are recommending CT to rule out a mass.  CT confirmed pneumonia and did not show definitive evidence of mass but they are recommending repeat for resolution.  Magnesium was 1.5.  Had serial troponins that were WNL.  Since patient has been anticoagulated for years and is requesting cardioversion felt that it was reasonable.  He had to wait 6 hours after he last ate and then underwent successful synchronized cardioversion in the emergency department.  He was also given ceftriaxone and doxycycline for his pneumonia.  Discharged on Augmentin and Doxy.    This patient presents to the ED for concern of complaints listed in HPI, this involves an extensive number of treatment options, and is a complaint that carries with it a high risk of complications and morbidity. Disposition including potential need for admission considered.   Dispo: DC Home. Return precautions discussed including, but not limited to, those listed in the AVS. Allowed pt time to ask questions which were answered fully prior to dc.  Additional history obtained from spouse Records reviewed Outpatient Clinic Notes The following labs were independently interpreted: Chemistry and show  hypomagnesemia I independently reviewed the following imaging with scope of interpretation limited to determining acute life threatening conditions related to emergency care: Chest x-ray and agree with the radiologist interpretation with the  following exceptions: none I personally reviewed and interpreted cardiac monitoring: normal sinus rhythm  and atrial fibrillation (normal rate) I personally reviewed and interpreted the pt's EKG: see above for interpretation  I have reviewed the patients home medications and made adjustments as needed  Portions of this note were generated with Dragon dictation software. Dictation errors may occur despite best attempts at proofreading.     Final Clinical Impression(s) / ED Diagnoses Final diagnoses:  Pneumonia of right lung due to infectious organism, unspecified part of lung  Atrial fibrillation, unspecified type (HCC)  Palpitations    Rx / DC Orders ED Discharge Orders          Ordered    doxycycline (VIBRAMYCIN) 100 MG capsule  2 times daily        04/21/23 1509    amoxicillin-clavulanate (AUGMENTIN) 875-125 MG tablet  Every 12 hours        04/21/23 1509              Rondel Baton, MD 04/21/23 2130

## 2023-04-21 NOTE — Discharge Instructions (Signed)
You were seen for your pneumonia and atrial fibrillation in the emergency department.  You had a cardioversion.  At home, please continue taking your Eliquis daily.  This is very important to prevent stroke.  Take the Augmentin and doxycycline prescribed you.    Check your MyChart online for the results of any tests that had not resulted by the time you left the emergency department.   Follow-up with your primary doctor in 2-3 days regarding your visit.  Follow-up with your cardiologist to soon as possible as well.  Return immediately to the emergency department if you experience any of the following: difficulty breathing, or any other concerning symptoms.    Thank you for visiting our Emergency Department. It was a pleasure taking care of you today.

## 2023-04-21 NOTE — ED Notes (Signed)
Patient is being discharged from the Urgent Care and sent to the Emergency Department via POV . Per NP, patient is in need of higher level of care due to abnormal EKG, worsening symptoms, shortness of breath. Patient is aware and verbalizes understanding of plan of care.  Vitals:   04/21/23 1007  BP: (!) 133/90  Pulse: 71  Resp: 20  Temp: 97.9 F (36.6 C)  SpO2: 97%    NP encouraged pt to go by EMS, pt/pt spouse declined. Reported would take pt straight to ED from UC by car. Pt alert and oriented, steady gait at time of leaving UC.

## 2023-04-21 NOTE — ED Notes (Signed)
Pt gone to CT 

## 2023-04-21 NOTE — Telephone Encounter (Signed)
Patient c/o Palpitations:  STAT if patient reporting lightheadedness, shortness of breath, or chest pain  How long have you had palpitations/irregular HR/ Afib? Since Monday  Are you having the symptoms now? No   Are you currently experiencing lightheadedness, SOB or CP? Nothing currently   Do you have a history of afib (atrial fibrillation) or irregular heart rhythm? Yes   Have you checked your BP or HR? (document readings if available): ranging form 65-122 since Monday.   Are you experiencing any other symptoms? Pt expressed concern about feeling SOB lately. He also stated he has some "twitching"/ tightness in his chest and nausea.

## 2023-04-21 NOTE — Telephone Encounter (Signed)
Spoke with pt on the phone and discussed the symptoms that he is having currently. Pt stated that starting Sunday evening he was feeling like he was becoming sick and ended up having a fever of 102. Pt stated he was then feeling a chest tightness and fluttering sensation that he hasn't felt since the last time he was in afib. Pt stated he used his [employer] mobile device and it keep ringing out possible afib so that is when he decided to call us. I explained to the pt that it would be best to go to the ED to get evaluated and get an EKG completed. Pt stated due to the cost, he would rather go to an urgent care because that is what he did last time and they are the ones that found the afib originally. Explained to pt that that would be okay to proceed with as long as we get an EKG to verify his heart rhythm soon. Pt stated the more we discussed the situation on the phone he felt it would be best to just go to the ED to skip the urgent care step since the last time he felt this way they sent him to the ED anyway. Explained to pt the different stand-alone ED facilities we have on top of the [facility] ED he talked about going to in order to make sure he knew all of his facility options. Pt verbalized understanding of our conversation and stated he would look to see which location would be the best for him to go to and go to get evaluated. Pt told to call our office with any further questions or concerns.

## 2023-04-21 NOTE — ED Triage Notes (Signed)
Pt reports palpitations since last night. Pt reports contacted cardiologist and referred pt to ED. Pt presents to UC and reports "I'm starting to feel worse." NP at bedside during triage.   Pt left for ED by POV before triage could be completed. VSS and EKG obtained before pt discharged to ED by provider.

## 2023-04-21 NOTE — ED Triage Notes (Signed)
Pt c/o a-fib flare up since Tuesday. Pt Sunday was not feeling well and gradually got worse. Pt did cardia mobile and showed possible a-fib. Pt went UC and was sent to the ED.

## 2023-04-21 NOTE — Discharge Instructions (Signed)
Go to the emergency department for further evaluation

## 2023-04-21 NOTE — ED Provider Triage Note (Signed)
Emergency Medicine Provider Triage Evaluation Note  WESTIN SPIGELMYER , a 57 y.o. male AF on eliquis was evaluated in triage.  Pt complains of palpitations, cough, and shortness of breath.  Review of Systems  Positive: fevers Negative: cough  Physical Exam  BP 119/74 (BP Location: Right Arm)   Pulse 62   Temp 98.5 F (36.9 C) (Oral)   Resp 20   Ht 5\' 9"  (1.753 m)   Wt 103.5 kg   SpO2 98%   BMI 33.70 kg/m  Gen:   Awake, no distress   Resp:  Normal effort  MSK:   Moves extremities without difficulty  Other:    Medical Decision Making  Medically screening exam initiated at 11:03 AM.  Appropriate orders placed.  [name] was informed that the remainder of the evaluation will be completed by another provider, this initial triage assessment does not replace that evaluation, and the importance of remaining in the ED until their evaluation is complete.   Rondel Baton, MD 04/21/23 [phone]

## 2023-04-21 NOTE — ED Provider Notes (Signed)
[facility]    CSN: [id number] Arrival date & time: 04/21/23  0954      History   Chief Complaint Chief Complaint  Patient presents with   Palpitations    HPI Stephen Roberts is a 57 y.o. male.   The history is provided by the patient.   Patient brought in by his spouse for complaints of shortness of breath, palpitations and "fluttering" in his chest that is been present for the past several days.  Patient reports prior history of atrial fibrillation.  States he has not had any trouble with his A-fib for the past several months.  Patient also endorses chest discomfort along with shortness of breath.  He states that he feels that his symptoms have worsened.  Patient denies diaphoresis, nausea, vomiting, pain that radiates into the arm or up the jaw, or difficulty breathing.  Patient reports that he did contact his cardiologist who advised him to follow-up in the emergency department.  Patient currently is taking Eliquis daily for his A-fib.  Patient also reports he has been cardioverted several times.  Past Medical History:  Diagnosis Date   A-fib (HCC)    Arthritis    Diabetes (HCC)    GERD (gastroesophageal reflux disease)    Heart murmur    IDA (iron deficiency anemia)    OSA (obstructive sleep apnea)    moderate obstructive sleep apnea with an AHI of 18.1/h   Pneumonia 06/27/2006    Patient Active Problem List   Diagnosis Date Noted   Multiple closed fractures of ribs of right side 04/12/2023   OSA (obstructive sleep apnea) 11/24/2022   Primary insomnia 11/24/2022   Lung nodule 11/24/2022   Current mild episode of major depressive disorder without prior episode (HCC) 07/13/2022   Screening for lung cancer 07/13/2022   Encounter for general adult medical examination with abnormal findings 03/07/2022   Hiatal hernia 01/12/2022   Persistent atrial fibrillation [facility])    Hospital discharge follow-up 09/01/2021   Atrial fibrillation with RVR  08/24/2021    Essential hypertension 08/24/2021   Iron deficiency anemia 08/24/2021   S/P cervical spinal fusion 06/30/2021   Chronic obstructive pulmonary disease (HCC) 06/30/2021   GERD (gastroesophageal reflux disease) 06/30/2021   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   Recurrent sinus infections 05/04/2020   Iron deficiency anemia due to chronic blood loss    Generalized weakness 03/24/2020   Dysphagia    Hyperlipidemia 02/13/2019   Type 2 diabetes mellitus with other specified complication (HCC) 03/14/2018   Cervical disc disorder with radiculopathy, high cervical region 05/30/2017   Chronic pain syndrome 11/10/2014   Allergic rhinitis 07/30/2014   Allergic dermatitis 07/30/2014   Prostate cancer screening 07/30/2014   Incarcerated epigastric hernia 01/11/2013   Incarcerated umbilical hernia 01/11/2013   Tobacco abuse 01/11/2013    Past Surgical History:  Procedure Laterality Date   ANTERIOR FUSION CERVICAL SPINE     CARDIOVERSION N/A 10/05/2021   Procedure: CARDIOVERSION;  Surgeon: Thurmon Fair, MD;  Location: [facility];  Service: Cardiovascular;  Laterality: N/A;   CARDIOVERSION N/A 11/18/2021   Procedure: CARDIOVERSION;  Surgeon: Thomasene Ripple, DO;  Location: [facility];  Service: Cardiovascular;  Laterality: N/A;   COLONOSCOPY WITH PROPOFOL N/A 03/26/2020   Procedure: COLONOSCOPY WITH PROPOFOL;  Surgeon: Corbin Ade, MD;  Location: [facility];  Service: Endoscopy;  Laterality: N/A;   disc in neck Left 05/2017   ESOPHAGEAL DILATION N/A 03/25/2020   Procedure: ESOPHAGEAL DILATION;  Surgeon: Lionel December  U, MD;  Location: [facility];  Service: Endoscopy;  Laterality: N/A;   ESOPHAGOGASTRODUODENOSCOPY (EGD) WITH PROPOFOL N/A 03/25/2020   Procedure: ESOPHAGOGASTRODUODENOSCOPY (EGD) WITH PROPOFOL;  Surgeon: Malissa Hippo, MD;  Location: [facility];  Service: Endoscopy;  Laterality: N/A;   GIVENS CAPSULE STUDY N/A 04/13/2020   Procedure: GIVENS CAPSULE STUDY;  Surgeon:  Corbin Ade, MD;  Location: [facility];  Service: Endoscopy;  Laterality: N/A;  7:30am   INSERTION OF MESH N/A 01/23/2013   Procedure: INSERTION OF MESH;  Surgeon: Ernestene Mention, MD;  Location: [facility];  Service: General;  Laterality: N/A;   LEFT HEART CATH AND CORONARY ANGIOGRAPHY N/A 08/27/2021   Procedure: LEFT HEART CATH AND CORONARY ANGIOGRAPHY;  Surgeon: [place], Peter M, MD;  Location: [facility] INVASIVE CV LAB;  Service: Cardiovascular;  Laterality: N/A;   POLYPECTOMY  03/26/2020   Procedure: POLYPECTOMY;  Surgeon: Corbin Ade, MD;  Location: [facility];  Service: Endoscopy;;   TONSILLECTOMY     as child   VENTRAL HERNIA REPAIR N/A 01/23/2013   Procedure: LAPAROSCOPIC MULTIPLE VENTRAL HERNIAS;  Surgeon: Ernestene Mention, MD;  Location: [facility];  Service: General;  Laterality: N/A;       Home Medications    Prior to Admission medications   Medication Sig Start Date End Date Taking? Authorizing Provider  acetaminophen (TYLENOL) 650 MG CR tablet Take 1,300 mg by mouth every 8 (eight) hours as needed for pain.    [provider]  albuterol (VENTOLIN HFA) 108 (90 Base) MCG/ACT inhaler Inhale 2 puffs into the lungs every 6 (six) hours as needed for wheezing or shortness of breath. 01/23/23   Anabel Halon, MD  ELIQUIS 5 MG TABS tablet Take 1 tablet by mouth twice daily 03/28/23   Riley Lam A, MD  esomeprazole (NEXIUM) 40 MG capsule Take 1 capsule (40 mg total) by mouth daily. 01/23/23   Anabel Halon, MD  Ferrous Sulfate (IRON) 325 (65 Fe) MG TABS Take 1 tablet (325 mg total) by mouth 2 (two) times daily. 01/23/23   Anabel Halon, MD  ferrous sulfate 325 (65 FE) MG EC tablet TAKE 1 TABLET(325 MG) BY MOUTH TWICE DAILY 05/20/22   Anabel Halon, MD  flecainide (TAMBOCOR) 100 MG tablet TAKE 1 TABLET BY MOUTH EVERY 12 HOURS 04/18/23   Camnitz, Will Daphine Deutscher, MD  fluticasone (FLONASE) 50 MCG/ACT nasal spray Place 2 sprays into both nostrils daily. 03/07/22   Anabel Halon, MD  furosemide (LASIX) 20 MG tablet Take 1 tablet (20 mg total) by mouth daily. 09/30/22 09/25/23  Christell Constant, MD  HYDROcodone-acetaminophen (NORCO/VICODIN) 5-325 MG tablet Take 1 tablet by mouth 3 (three) times daily as needed for moderate pain (pain score 4-6). 03/05/23   Particia Nearing, PA-C  Lactobacillus Rhamnosus, GG, (PROBIOTIC COLIC) LIQD Take 1 tablet by mouth 4 (four) times a week.    [provider]  metFORMIN (GLUCOPHAGE) 1000 MG tablet Take 1 tablet (1,000 mg total) by mouth 2 (two) times daily with a meal. 01/23/23   Anabel Halon, MD  metoprolol succinate (TOPROL-XL) 100 MG 24 hr tablet TAKE 1  BY MOUTH ONCE DAILY 01/17/23   Camnitz, Andree Coss, MD  Multiple Vitamin (MULTIVITAMIN) tablet Take 1 tablet by mouth daily.    [provider]  potassium chloride (KLOR-CON M) 10 MEQ tablet Take 1 tablet by mouth once daily 06/24/22   Camnitz, Andree Coss, MD  pravastatin (PRAVACHOL) 20 MG tablet  TAKE 1 TABLET BY MOUTH 3 TIMES EVERY WEEK 01/23/23   Anabel Halon, MD  Semaglutide,0.25 or 0.5MG /DOS, (OZEMPIC, 0.25 OR 0.5 MG/DOSE,) 2 MG/1.5ML SOPN Inject 0.5 mg into the skin every 7 (seven) days. Through patient assistance.    [provider]  tiZANidine (ZANAFLEX) 4 MG capsule Take 1 capsule (4 mg total) by mouth 3 (three) times daily as needed for muscle spasms. Do not drink alcohol or drive while taking this medication.  May cause drowsiness. 03/05/23   Particia Nearing, PA-C  traZODone (DESYREL) 50 MG tablet Take 0.5-1 tablets (25-50 mg total) by mouth at bedtime as needed for sleep. 11/24/22   Anabel Halon, MD  vitamin B-12 (CYANOCOBALAMIN) 1000 MCG tablet Take 1,000 mcg by mouth daily.    [provider]    Family History Family History  Problem Relation Age of Onset   Depression Mother    Hearing loss Mother    Hyperlipidemia Mother    Hypertension Mother    Stroke Mother    Early death Father    Diabetes Brother     Pancreatic cancer Paternal Uncle    Colon cancer Neg Hx     Social History Social History   Tobacco Use   Smoking status: Every Day    Current packs/day: 0.75    Average packs/day: 0.8 packs/day for 45.4 years (34.1 ttl pk-yrs)    Types: Cigarettes    Start date: 1980   Smokeless tobacco: Never  Vaping Use   Vaping status: Never Used  Substance Use Topics   Alcohol use: Yes    Alcohol/week: 4.0 standard drinks of alcohol    Types: 4 Cans of beer per week    Comment: moderately   Drug use: Not Currently    Types: Cocaine    Comment: states he has tried it all. Last used cocaine a few years ago..     Allergies   Gabapentin, Lyrica [pregabalin], Crestor [rosuvastatin], and Farxiga [dapagliflozin]   Review of Systems Review of Systems Per HPI  Physical Exam Triage Vital Signs ED Triage Vitals [04/21/23 1007]  Encounter Vitals Group     BP (!) 133/90     Systolic BP Percentile      Diastolic BP Percentile      Pulse Rate 71     Resp 20     Temp 97.9 F (36.6 C)     Temp Source Oral     SpO2 97 %     Weight      Height      Head Circumference      Peak Flow      Pain Score      Pain Loc      Pain Education      Exclude from Growth Chart    No data found.  Updated Vital Signs BP (!) 133/90 (BP Location: Right Arm)   Pulse 71   Temp 97.9 F (36.6 C) (Oral)   Resp 20   SpO2 97%   Visual Acuity Right Eye Distance:   Left Eye Distance:   Bilateral Distance:    Right Eye Near:   Left Eye Near:    Bilateral Near:     Physical Exam Vitals and nursing note reviewed.  Constitutional:      General: He is not in acute distress.    Appearance: Normal appearance.  HENT:     Head: Normocephalic.  Eyes:     Extraocular Movements: Extraocular movements intact.  Pupils: Pupils are equal, round, and reactive to light.  Cardiovascular:     Rate and Rhythm: Rhythm irregular.     Pulses: Normal pulses.     Heart sounds: Normal heart sounds.  Pulmonary:      Effort: Pulmonary effort is normal. No respiratory distress.     Breath sounds: Normal breath sounds. No stridor. No wheezing, rhonchi or rales.  Abdominal:     General: Bowel sounds are normal.     Palpations: Abdomen is soft.     Tenderness: There is no abdominal tenderness.  Musculoskeletal:     Cervical back: Normal range of motion.     Right lower leg: No edema.     Left lower leg: No edema.  Lymphadenopathy:     Cervical: No cervical adenopathy.  Skin:    General: Skin is warm and dry.  Neurological:     General: No focal deficit present.     Mental Status: He is alert and oriented to person, place, and time.  Psychiatric:        Mood and Affect: Mood normal.        Behavior: Behavior normal.      UC Treatments / Results  Labs (all labs ordered are listed, but only abnormal results are displayed) Labs Reviewed - No data to display  EKG: Atrial fibrillation with PVCs, no STEMI.  Compared to EKGs performed on 09/23/2022, 03/23/2022, and 12/20/2021.   Radiology DG Chest 2 View  Result Date: 04/21/2023 CLINICAL DATA:  Chest pressure. EXAM: CHEST - 2 VIEW COMPARISON:  03/05/2023. FINDINGS: There is new opacity in the right hilar region, which may represent consolidation versus lung mass in the upper portion of right lower lobe. Further evaluation with chest CT scan is recommended. Bilateral lung fields are otherwise clear. Bilateral costophrenic angles are clear. Normal cardio-mediastinal silhouette. No acute osseous abnormalities. Cervicothoracic spinal fixation hardware seen. The soft tissues are within normal limits. IMPRESSION: *There is new opacity in the right hilar region, which may represent consolidation versus lung mass in the upper portion of right lower lobe. Further evaluation with chest CT scan is recommended. Electronically Signed   By: Jules Schick M.D.   On: 04/21/2023 11:30    Procedures Procedures (including critical care time)  Medications Ordered in  UC Medications - No data to display  Initial Impression / Assessment and Plan / UC Course  I have reviewed the triage vital signs and the nursing notes.  Pertinent labs & imaging results that were available during my care of the patient were reviewed by me and considered in my medical decision making (see chart for details).  EKG performed which shows atrial fibrillation with PVCs.  Patient is currently complaining of worsening chest discomfort and shortness of breath.  Patient is speaking in complete sentences, he does not appear to be in any distress.  Given the patient's current symptoms and EKG findings, recommend patient be seen in the emergency department for further evaluation.  Patient reports that his symptoms feel to be worsening, provider recommended patient be transported to the emergency department via EMS.  Patient and his spouse both declined.  Discussed risk associated with travel via private vehicle given his current complaints.  Patient and wife both verbalized understanding.  Patient was discharged to the emergency department for further evaluation, vital signs are all normal look at stable.  Final Clinical Impressions(s) / UC Diagnoses   Final diagnoses:  None   Discharge Instructions   None  ED Prescriptions   None    PDMP not reviewed this encounter.   Abran Cantor, NP 04/21/23 1143

## 2023-04-21 NOTE — ED Notes (Signed)
Pt back from CT

## 2023-04-24 ENCOUNTER — Encounter: Payer: Self-pay | Admitting: Hematology

## 2023-04-24 ENCOUNTER — Telehealth: Payer: Self-pay

## 2023-04-24 NOTE — Transitions of Care (Post Inpatient/ED Visit) (Signed)
04/24/2023  Name: Stephen Roberts MRN: [id number] DOB: November 30, 1965  Today's TOC FU Call Status: Today's TOC FU Call Status:: Successful TOC FU Call Completed TOC FU Call Complete Date: 04/24/23 Patient's Name and Date of Birth confirmed.  Transition Care Management Follow-up Telephone Call Date of Discharge: 04/21/23 [facility] Type of Discharge: Emergency Department Reason for ED Visit: Respiratory Respiratory Diagnosis: Pnuemonia How have you been since you were released from the hospital?: Better Any questions or concerns?: Yes Patient Questions/Concerns:: has questions about having to have another CT on 05/05/23. just had one with contrast Patient Questions/Concerns Addressed: Notified Provider of Patient Questions/Concerns  Items Reviewed: Did you receive and understand the discharge instructions provided?: Yes Medications obtained,verified, and reconciled?: Yes (Medications Reviewed) Any new allergies since your discharge?: No Dietary orders reviewed?: NA Do you have support at home?: Yes People in Home: spouse  Medications Reviewed Today: Medications Reviewed Today     Reviewed by Edinson Domeier, Jordan Hawks, CMA (Certified Medical Assistant) on 04/24/23 at 1612  Med List Status: <None>   Medication Order Taking? Sig Documenting Provider Last Dose Status Informant  acetaminophen (TYLENOL) 650 MG CR tablet [id number] No Take 1,300 mg by mouth every 8 (eight) hours as needed for pain. [provider] 04/20/2023 Active Self  albuterol (VENTOLIN HFA) 108 (90 Base) MCG/ACT inhaler [id number] No Inhale 2 puffs into the lungs every 6 (six) hours as needed for wheezing or shortness of breath. Anabel Halon, MD 04/21/2023 Active Self  amoxicillin-clavulanate (AUGMENTIN) 875-125 MG tablet [id number]  Take 1 tablet by mouth every 12 (twelve) hours. Rondel Baton, MD  Active   doxycycline (VIBRAMYCIN) 100 MG capsule [id number]  Take 1 capsule (100 mg total)  by mouth 2 (two) times daily. Rondel Baton, MD  Active   ELIQUIS 5 MG TABS tablet [id number] No Take 1 tablet by mouth twice daily Riley Lam A, MD 04/21/2023 0630 Active Self  esomeprazole (NEXIUM) 40 MG capsule [id number] No Take 1 capsule (40 mg total) by mouth daily. Anabel Halon, MD 04/21/2023 Active Self  Ferrous Sulfate (IRON) 325 (65 Fe) MG TABS [id number] No Take 1 tablet (325 mg total) by mouth 2 (two) times daily. Anabel Halon, MD 04/21/2023 Active Self  ferrous sulfate 325 (65 FE) MG EC tablet [id number] No TAKE 1 TABLET(325 MG) BY MOUTH TWICE DAILY Anabel Halon, MD 04/21/2023 Active Self  flecainide (TAMBOCOR) 100 MG tablet [id number] No TAKE 1 TABLET BY MOUTH EVERY 12 HOURS Camnitz, Will Daphine Deutscher, MD 04/21/2023 Active Self  fluticasone (FLONASE) 50 MCG/ACT nasal spray [id number] No Place 2 sprays into both nostrils daily. Anabel Halon, MD 04/21/2023 Active Self  furosemide (LASIX) 20 MG tablet [id number] No Take 1 tablet (20 mg total) by mouth daily. Christell Constant, MD 04/21/2023 Active Self  HYDROcodone-acetaminophen (NORCO/VICODIN) 5-325 MG tablet [id number] No Take 1 tablet by mouth 3 (three) times daily as needed for moderate pain (pain score 4-6). Particia Nearing, [place] Past Month Active Self  Lactobacillus Rhamnosus, GG, (PROBIOTIC COLIC) LIQD [id number] No Take 1 tablet by mouth 4 (four) times a week. [provider] 04/21/2023 Active Self  metFORMIN (GLUCOPHAGE) 1000 MG tablet [id number] No Take 1 tablet (1,000 mg total) by mouth 2 (two) times daily with a meal. Anabel Halon, MD 04/21/2023 Active Self  metoprolol succinate (TOPROL-XL) 100 MG 24 hr tablet [id number] No TAKE 1  BY MOUTH ONCE DAILY Regan Lemming, MD 04/21/2023 Active Self  Multiple Vitamin (MULTIVITAMIN)  tablet [id number] No Take 1 tablet by mouth daily. [provider] 04/21/2023 Active Self  potassium chloride (KLOR-CON M) 10 MEQ tablet [id number] No Take 1 tablet by  mouth once daily Regan Lemming, MD 04/21/2023 Active Self  pravastatin (PRAVACHOL) 20 MG tablet [id number] No TAKE 1 TABLET BY MOUTH 3 TIMES EVERY WEEK Allena Katz, Earlie Lou, MD Past Week Active Self  Semaglutide,0.25 or 0.5MG /DOS, (OZEMPIC, 0.25 OR 0.5 MG/DOSE,) 2 MG/1.5ML SOPN [id number] No Inject 0.5 mg into the skin every 7 (seven) days. Through patient assistance. [provider] Past Week Active Self  tiZANidine (ZANAFLEX) 4 MG capsule [id number] No Take 1 capsule (4 mg total) by mouth 3 (three) times daily as needed for muscle spasms. Do not drink alcohol or drive while taking this medication.  May cause drowsiness.  Patient not taking: Reported on 04/21/2023   Particia Nearing, PA-C Not Taking Active Self  traZODone (DESYREL) 50 MG tablet [id number] No Take 0.5-1 tablets (25-50 mg total) by mouth at bedtime as needed for sleep.  Patient not taking: Reported on 04/21/2023   Anabel Halon, MD Not Taking Active Self  vitamin B-12 (CYANOCOBALAMIN) 1000 MCG tablet [id number] No Take 1,000 mcg by mouth daily. [provider] 04/20/2023 Active Self            Home Care and Equipment/Supplies: Were Home Health Services Ordered?: NA Any new equipment or medical supplies ordered?: NA  Functional Questionnaire: Do you need assistance with bathing/showering or dressing?: No Do you need assistance with meal preparation?: No Do you need assistance with eating?: No Do you have difficulty maintaining continence: No Do you need assistance with getting out of bed/getting out of a chair/moving?: No Do you have difficulty managing or taking your medications?: No  Follow up appointments reviewed: PCP Follow-up appointment confirmed?: Yes Date of PCP follow-up appointment?: 04/27/23 Follow-up Provider: Gilmore Laroche Specialist [facility] Follow-up appointment confirmed?: No Reason Specialist Follow-Up Not Confirmed: Patient has Specialist Provider Number and will Call for  Appointment Do you need transportation to your follow-up appointment?: No Do you understand care options if your condition(s) worsen?: Yes-patient verbalized understanding    Abby Jamarkus Lisbon, CMA  [facility] AWV Team Direct Dial: [phone]

## 2023-04-26 ENCOUNTER — Telehealth: Payer: Self-pay | Admitting: Cardiology

## 2023-04-26 NOTE — Telephone Encounter (Signed)
Patient c/o Palpitations:  STAT if patient reporting lightheadedness, shortness of breath, or chest pain  How long have you had palpitations/irregular HR/ Afib? Are you having the symptoms now? Feels he went back into afib 12/09  Are you currently experiencing lightheadedness, SOB or CP? Yes, chest heaviness/tightness.   Do you have a history of afib (atrial fibrillation) or irregular heart rhythm? Yes  Have you checked your BP or HR? (document readings if available): HR ranging 85-121. HR at time of call was 87 unclassified   Are you experiencing any other symptoms? No    Mayford Knife is stating unclassified since Monday.

## 2023-04-26 NOTE — Telephone Encounter (Signed)
Pt called in to report AF. Reports was in the ED on 04/21/23 had a DCCV converted to NSR and CXR that showed PNA. On Monday Kardia mobile started to show rhythm unclassified then possible AF.  Has an OV with PCP tomorrow morning. Is currently c/o difficulty taking a deep breath.  Wants to know if he should go back to the ED.  Advised pt that dx of PNA can cause difficulty breathing. Also advised that illness can trigger AF. Pt reports missed a dose of Eliquis on 04/18/23.  Has not missed any doses of metoprolol 100 mg succinate.  Highest HR 121 currently 85.  Feels okay as long as he is sitting still.  Advised pt if symptoms persist or worsen to call 911 for evaluation.  If not go to OV scheduled for tomorrow.  Will send message to EP to f/u.

## 2023-04-26 NOTE — Progress Notes (Signed)
Established Patient Office Visit  Subjective:  Patient ID: Stephen Roberts, male    DOB: [birth year]  Age: 57 y.o. MRN: [id number]  CC:  Chief Complaint  Patient presents with   Follow-up    ER f/u, states he will be seeing cardiology on 12/26. States feels a little pressure in his chest at times.     HPI Stephen Roberts is a 57 y.o. male with past medical history of atrial fibrillation on flecainide and Eliquis and tobacco use presents for ED f/u. For the details of today's visit, please refer to the assessment and plan.    Pneumonia of the Right Lung and Atrial Fibrillation:The patient presented to the ED with complaints of palpitations, cough, and shortness of breath. Upon arrival, he was found to be in atrial fibrillation with rate control. Imaging, including chest X-ray and CT scan, revealed possible pneumonia. Cardioversion was successfully conducted in the ED, and the patient was treated with ceftriaxone and doxycycline during his hospital stay. He was discharged with a prescription for Augmentin and doxycycline to continue treating the pneumonia.  Since discharge, the patient reports experiencing chest pressure and tightness after smoking cigarettes. He currently smokes half a pack per day and has noticed that smoking triggers episodes of atrial fibrillation, with the last occurrence this morning. He stopped smoking after noticing the onset of A-fib. During today's office visit, he reports feeling fine overall, with only some mild chest pressure.The patient is due for a repeat CT scan of the chest on May 07, 2022, to monitor his pulmonary nodules.  Past Medical History:  Diagnosis Date   A-fib (HCC)    Arthritis    Diabetes (HCC)    GERD (gastroesophageal reflux disease)    Heart murmur    IDA (iron deficiency anemia)    OSA (obstructive sleep apnea)    moderate obstructive sleep apnea with an AHI of 18.1/h   Pneumonia 06/27/2006    Past Surgical History:  Procedure  Laterality Date   ANTERIOR FUSION CERVICAL SPINE     CARDIOVERSION N/A 10/05/2021   Procedure: CARDIOVERSION;  Surgeon: Thurmon Fair, MD;  Location: [facility];  Service: Cardiovascular;  Laterality: N/A;   CARDIOVERSION N/A 11/18/2021   Procedure: CARDIOVERSION;  Surgeon: Thomasene Ripple, DO;  Location: [facility];  Service: Cardiovascular;  Laterality: N/A;   COLONOSCOPY WITH PROPOFOL N/A 03/26/2020   Procedure: COLONOSCOPY WITH PROPOFOL;  Surgeon: Corbin Ade, MD;  Location: [facility];  Service: Endoscopy;  Laterality: N/A;   disc in neck Left 05/2017   ESOPHAGEAL DILATION N/A 03/25/2020   Procedure: ESOPHAGEAL DILATION;  Surgeon: Malissa Hippo, MD;  Location: [facility];  Service: Endoscopy;  Laterality: N/A;   ESOPHAGOGASTRODUODENOSCOPY (EGD) WITH PROPOFOL N/A 03/25/2020   Procedure: ESOPHAGOGASTRODUODENOSCOPY (EGD) WITH PROPOFOL;  Surgeon: Malissa Hippo, MD;  [facility];  Service: Endoscopy;  Laterality: N/A;   GIVENS CAPSULE STUDY N/A 04/13/2020   Procedure: GIVENS CAPSULE STUDY;  Surgeon: Corbin Ade, MD;  [facility];  Service: Endoscopy;  Laterality: N/A;  7:30am   INSERTION OF MESH N/A 01/23/2013   Procedure: INSERTION OF MESH;  Surgeon: Ernestene Mention, MD;  Location: [facility];  Service: General;  Laterality: N/A;   LEFT HEART CATH AND CORONARY ANGIOGRAPHY N/A 08/27/2021   Procedure: LEFT HEART CATH AND CORONARY ANGIOGRAPHY;  Surgeon: Swaziland, Peter M, MD;  Location: [facility] INVASIVE CV LAB;  Service: Cardiovascular;  Laterality: N/A;   POLYPECTOMY  03/26/2020  Procedure: POLYPECTOMY;  Surgeon: Corbin Ade, MD;  [facility];  Service: Endoscopy;;   TONSILLECTOMY     as child   VENTRAL HERNIA REPAIR N/A 01/23/2013   Procedure: LAPAROSCOPIC MULTIPLE VENTRAL HERNIAS;  Surgeon: Ernestene Mention, MD;  Location: [facility];  Service: General;  Laterality: N/A;    Family History  Problem Relation Age of Onset   Depression Mother     Hearing loss Mother    Hyperlipidemia Mother    Hypertension Mother    Stroke Mother    Early death Father    Diabetes Brother    Pancreatic cancer Paternal Uncle    Colon cancer Neg Hx     Social History   Socioeconomic History   Marital status: Married    Spouse name: Not on file   Number of children: Not on file   Years of education: Not on file   Highest education level: Not on file  Occupational History   Not on file  Tobacco Use   Smoking status: Every Day    Current packs/day: 0.75    Average packs/day: 0.7 packs/day for 45.4 years (34.1 ttl pk-yrs)    Types: Cigarettes    Start date: 10   Smokeless tobacco: Never  Vaping Use   Vaping status: Never Used  Substance and Sexual Activity   Alcohol use: Yes    Alcohol/week: 4.0 standard drinks of alcohol    Types: 4 Cans of beer per week    Comment: moderately   Drug use: Not Currently    Types: Cocaine    Comment: states he has tried it all. Last used cocaine a few years ago.Marland Kitchen   Sexual activity: Yes  Other Topics Concern   Not on file  Social History Narrative   Not on file   Social Drivers of Health   Financial Resource Strain: Low Risk  (02/13/2023)   Overall Financial Resource Strain (CARDIA)    Difficulty of Paying Living Expenses: Not hard at all  Food Insecurity: Low Risk  (02/21/2023)   Received from [facility]   Hunger Vital Sign    Worried About Running Out of Food in the Last Year: Never true    Ran Out of Food in the Last Year: Never true  Transportation Needs: No Transportation Needs (02/21/2023)   Received from [facility]    In the past 12 months, has lack of reliable transportation kept you from medical appointments, meetings, work or from getting things needed for daily living? : No  Physical Activity: Insufficiently Active (02/13/2023)   Exercise Vital Sign    Days of Exercise per Week: 5 days    Minutes of Exercise per Session: 20 min  Stress: No Stress Concern  Present (02/13/2023)   [employer] of Occupational Health - Occupational Stress Questionnaire    Feeling of Stress : Only a little  Social Connections: Socially Isolated (02/13/2023)   Social Connection and Isolation Panel [NHANES]    Frequency of Communication with Friends and Family: Never    Frequency of Social Gatherings with Friends and Family: Once a week    Attends Religious Services: Never    Database administrator or Organizations: No    Attends Banker Meetings: Never    Marital Status: Married  Catering manager Violence: Not At Risk (02/13/2023)   Humiliation, Afraid, Rape, and Kick questionnaire    Fear of Current or Ex-Partner: No    Emotionally Abused: No  Physically Abused: No    Sexually Abused: No    Outpatient Medications Prior to Visit  Medication Sig Dispense Refill   acetaminophen (TYLENOL) 650 MG CR tablet Take 1,300 mg by mouth every 8 (eight) hours as needed for pain.     albuterol (VENTOLIN HFA) 108 (90 Base) MCG/ACT inhaler Inhale 2 puffs into the lungs every 6 (six) hours as needed for wheezing or shortness of breath. 18 g 2   amoxicillin-clavulanate (AUGMENTIN) 875-125 MG tablet Take 1 tablet by mouth every 12 (twelve) hours. 14 tablet 0   doxycycline (VIBRAMYCIN) 100 MG capsule Take 1 capsule (100 mg total) by mouth 2 (two) times daily. 20 capsule 0   ELIQUIS 5 MG TABS tablet Take 1 tablet by mouth twice daily 60 tablet 0   esomeprazole (NEXIUM) 40 MG capsule Take 1 capsule (40 mg total) by mouth daily. 90 capsule 3   Ferrous Sulfate (IRON) 325 (65 Fe) MG TABS Take 1 tablet (325 mg total) by mouth 2 (two) times daily. 240 tablet 0   ferrous sulfate 325 (65 FE) MG EC tablet TAKE 1 TABLET(325 MG) BY MOUTH TWICE DAILY 60 tablet 3   flecainide (TAMBOCOR) 100 MG tablet TAKE 1 TABLET BY MOUTH EVERY 12 HOURS 180 tablet 2   fluticasone (FLONASE) 50 MCG/ACT nasal spray Place 2 sprays into both nostrils daily. 16 g 6   furosemide (LASIX) 20 MG  tablet Take 1 tablet (20 mg total) by mouth daily. 90 tablet 3   HYDROcodone-acetaminophen (NORCO/VICODIN) 5-325 MG tablet Take 1 tablet by mouth 3 (three) times daily as needed for moderate pain (pain score 4-6). 15 tablet 0   Lactobacillus Rhamnosus, GG, (PROBIOTIC COLIC) LIQD Take 1 tablet by mouth 4 (four) times a week.     metFORMIN (GLUCOPHAGE) 1000 MG tablet Take 1 tablet (1,000 mg total) by mouth 2 (two) times daily with a meal. 60 tablet 5   metoprolol succinate (TOPROL-XL) 100 MG 24 hr tablet TAKE 1  BY MOUTH ONCE DAILY 90 tablet 1   Multiple Vitamin (MULTIVITAMIN) tablet Take 1 tablet by mouth daily.     potassium chloride (KLOR-CON M) 10 MEQ tablet Take 1 tablet by mouth once daily 90 tablet 3   pravastatin (PRAVACHOL) 20 MG tablet TAKE 1 TABLET BY MOUTH 3 TIMES EVERY WEEK 15 tablet 10   Semaglutide,0.25 or 0.5MG /DOS, (OZEMPIC, 0.25 OR 0.5 MG/DOSE,) 2 MG/1.5ML SOPN Inject 0.5 mg into the skin every 7 (seven) days. Through patient assistance.     tiZANidine (ZANAFLEX) 4 MG capsule Take 1 capsule (4 mg total) by mouth 3 (three) times daily as needed for muscle spasms. Do not drink alcohol or drive while taking this medication.  May cause drowsiness. 15 capsule 0   traZODone (DESYREL) 50 MG tablet Take 0.5-1 tablets (25-50 mg total) by mouth at bedtime as needed for sleep. 30 tablet 3   vitamin B-12 (CYANOCOBALAMIN) 1000 MCG tablet Take 1,000 mcg by mouth daily.     Facility-Administered Medications Prior to Visit  Medication Dose Route Frequency Provider Last Rate Last Admin   iron sucrose (VENOFER) 200 mg in sodium chloride 0.9 % 100 mL IVPB  200 mg Intravenous Once Pennington, Rebekah M, PA-C        Allergies  Allergen Reactions   Gabapentin Shortness Of Breath   Lyrica [Pregabalin] Shortness Of Breath   Crestor [Rosuvastatin] Other (See Comments)    myalgia   Farxiga [Dapagliflozin]     Developed UTI    ROS Review of  Systems  Constitutional:  Negative for chills and fever.   Respiratory:  Positive for cough. Negative for shortness of breath.   Neurological:  Negative for dizziness and headaches.      Objective:    Physical Exam HENT:     Head: Normocephalic.     Right Ear: External ear normal.     Left Ear: External ear normal.     Nose: No congestion or rhinorrhea.     Mouth/Throat:     Mouth: Mucous membranes are moist.  Cardiovascular:     Rate and Rhythm: Regular rhythm.     Heart sounds: No murmur heard. Pulmonary:     Effort: No respiratory distress.     Breath sounds: Normal breath sounds.  Neurological:     Mental Status: He is alert.     BP 121/76   Pulse 82   Ht 5\' 9"  (1.753 m)   Wt 227 lb 0.6 oz (103 kg)   SpO2 95%   BMI 33.53 kg/m  Wt Readings from Last 3 Encounters:  04/27/23 227 lb 0.6 oz (103 kg)  04/21/23 228 lb 3.2 oz (103.5 kg)  04/12/23 228 lb 3.2 oz (103.5 kg)    Lab Results  Component Value Date   TSH 1.910 03/08/2022   Lab Results  Component Value Date   WBC 7.4 04/27/2023   HGB 14.9 04/27/2023   HCT 45.9 04/27/2023   MCV 100 (H) 04/27/2023   PLT 176 04/27/2023   Lab Results  Component Value Date   NA 140 04/27/2023   K 4.9 04/27/2023   CO2 24 04/27/2023   GLUCOSE 78 04/27/2023   BUN 18 04/27/2023   CREATININE 0.80 04/27/2023   BILITOT 0.3 02/08/2023   ALKPHOS 125 (H) 02/08/2023   AST 18 02/08/2023   ALT 18 02/08/2023   PROT 7.4 02/08/2023   ALBUMIN 4.2 02/08/2023   CALCIUM 9.5 04/27/2023   ANIONGAP 12 04/21/2023   EGFR 102 04/27/2023   Lab Results  Component Value Date   CHOL 162 02/08/2023   Lab Results  Component Value Date   HDL 34 (L) 02/08/2023   Lab Results  Component Value Date   LDLCALC 88 02/08/2023   Lab Results  Component Value Date   TRIG 239 (H) 02/08/2023   Lab Results  Component Value Date   CHOLHDL 4.8 02/08/2023   Lab Results  Component Value Date   HGBA1C 8.9 (H) 02/08/2023      Assessment & Plan:  Pneumonia of right lung due to infectious organism,  unspecified part of lung Assessment & Plan: Labs and imaging studies have been reviewed A repeat chest X-ray will be obtained in 6 weeks to assess for resolution of pneumonia Smoking cessation was highly encouraged and strongly emphasized, given its potential impact on the patient's atrial fibrillation and overall health The patient was also encouraged to follow up with his cardiologist as scheduled. CBC and BMP results are currently pending  Orders: -     DG Chest 2 View; Future -     CBC with Differential/Platelet -     BMP8+EGFR   Note: This chart has been completed using Engineer, civil (consulting) software, and while attempts have been made to ensure accuracy, certain words and phrases may not be transcribed as intended.   Follow-up: No follow-ups on file.   Gilmore Laroche, FNP

## 2023-04-27 ENCOUNTER — Encounter: Payer: Self-pay | Admitting: Family Medicine

## 2023-04-27 ENCOUNTER — Ambulatory Visit (INDEPENDENT_AMBULATORY_CARE_PROVIDER_SITE_OTHER): Payer: PPO | Admitting: Family Medicine

## 2023-04-27 VITALS — BP 121/76 | HR 82 | Ht 69.0 in | Wt 227.0 lb

## 2023-04-27 DIAGNOSIS — J189 Pneumonia, unspecified organism: Secondary | ICD-10-CM

## 2023-04-27 NOTE — Patient Instructions (Addendum)
I appreciate the opportunity to provide care to you today!   Labs: please stop by the lab today to get your blood drawn (CBC, BMP)  Please follow up with your cardiologist as scheduled.  I recommend a repeat imaging study of your chest on May 29, 2023, to assess for pneumonia resolution. Smoking cessation is highly encouraged.   Please continue to a heart-healthy diet and increase your physical activities. Try to exercise for at least five days a week.    It was a pleasure to see you and I look forward to continuing to work together on your health and well-being. Please do not hesitate to call the office if you need care or have questions about your care.  In case of emergency, please visit the Emergency Department for urgent care, or contact our clinic at [phone] to schedule an appointment. We're here to help you!   Have a wonderful day and week. With Gratitude, Gilmore Laroche MSN, FNP-BC

## 2023-04-28 ENCOUNTER — Telehealth: Payer: Self-pay

## 2023-04-28 ENCOUNTER — Other Ambulatory Visit: Payer: Self-pay

## 2023-04-28 DIAGNOSIS — J188 Other pneumonia, unspecified organism: Secondary | ICD-10-CM | POA: Insufficient documentation

## 2023-04-28 DIAGNOSIS — J189 Pneumonia, unspecified organism: Secondary | ICD-10-CM | POA: Insufficient documentation

## 2023-04-28 LAB — CBC WITH DIFFERENTIAL/PLATELET
Basophils Absolute: 0 x10E3/uL (ref 0.0–0.2)
Basos: 1 %
EOS (ABSOLUTE): 0.2 x10E3/uL (ref 0.0–0.4)
Eos: 2 %
Hematocrit: 45.9 % (ref 37.5–51.0)
Hemoglobin: 14.9 g/dL (ref 13.0–17.7)
Immature Grans (Abs): 0 x10E3/uL (ref 0.0–0.1)
Immature Granulocytes: 0 %
Lymphocytes Absolute: 2.3 x10E3/uL (ref 0.7–3.1)
Lymphs: 31 %
MCH: 32.3 pg (ref 26.6–33.0)
MCHC: 32.5 g/dL (ref 31.5–35.7)
MCV: 100 fL — ABNORMAL HIGH (ref 79–97)
Monocytes Absolute: 0.7 x10E3/uL (ref 0.1–0.9)
Monocytes: 10 %
Neutrophils Absolute: 4.2 x10E3/uL (ref 1.4–7.0)
Neutrophils: 56 %
Platelets: 176 x10E3/uL (ref 150–450)
RBC: 4.61 x10E6/uL (ref 4.14–5.80)
RDW: 13 % (ref 11.6–15.4)
WBC: 7.4 x10E3/uL (ref 3.4–10.8)

## 2023-04-28 LAB — BMP8+EGFR
BUN/Creatinine Ratio: 23 — ABNORMAL HIGH (ref 9–20)
BUN: 18 mg/dL (ref 6–24)
CO2: 24 mmol/L (ref 20–29)
Calcium: 9.5 mg/dL (ref 8.7–10.2)
Chloride: 99 mmol/L (ref 96–106)
Creatinine, Ser: 0.8 mg/dL (ref 0.76–1.27)
Glucose: 78 mg/dL (ref 70–99)
Potassium: 4.9 mmol/L (ref 3.5–5.2)
Sodium: 140 mmol/L (ref 134–144)
eGFR: 102 mL/min/1.73 (ref >59)

## 2023-04-28 NOTE — Telephone Encounter (Signed)
Pt advised to keep follow up on 12/26.  He is agreeable to plan.

## 2023-04-28 NOTE — Telephone Encounter (Signed)
Spoke with spouse. Recent admission for afib and pneumonia. States he is scheduled for a 6 month follow up CT 05/08/23. States CT done during admission requesting 1-3 month follow. CT rescheduled for 05/29/2023.

## 2023-04-28 NOTE — Telephone Encounter (Signed)
Pt aware to keep 12/26 appt.  He is agreeable to plan.

## 2023-04-28 NOTE — Assessment & Plan Note (Addendum)
Labs and imaging studies have been reviewed A repeat chest X-ray will be obtained in 6 weeks to assess for resolution of pneumonia Smoking cessation was highly encouraged and strongly emphasized, given its potential impact on the patient's atrial fibrillation and overall health The patient was also encouraged to follow up with his cardiologist as scheduled. CBC and BMP results are currently pending

## 2023-05-05 ENCOUNTER — Inpatient Hospital Stay: Payer: PPO

## 2023-05-08 ENCOUNTER — Ambulatory Visit (HOSPITAL_COMMUNITY): Payer: PPO

## 2023-05-08 ENCOUNTER — Inpatient Hospital Stay: Payer: PPO | Admitting: Oncology

## 2023-05-08 ENCOUNTER — Ambulatory Visit: Payer: PPO | Admitting: Internal Medicine

## 2023-05-10 NOTE — Progress Notes (Unsigned)
Electrophysiology Office Note:   Date:  05/11/2023  ID:  HARGUN GILLIG, DOB 1965/07/10, MRN [id number]  Primary Cardiologist: Christell Constant, MD Primary Heart Failure: None Electrophysiologist: Will Jorja Loa, MD      History of Present Illness:   Stephen Roberts is a 57 y.o. male with h/o AF, PVC's, tobacco abuse, OSA (intolerant to CPAP), ETOH abuse, DM, seen today for post ER visit for AF, PNA.   Patient was recently seen in an UC 04/24/23 for "fluttering" in his chest for several days. His Kardia Mobile showed possible AF at home. EKG showed AF with PVC's. He had associated chest discomfort and shortness of breath. The patient was febrile to 102, had cough, congestion, & fatigue. The patient requested cardioversion.  CXR at that time showed RLL GGO, and airspace consolidation with multiple surrounding part solid nodules in the central RU/RM & RLL concerning for multifocal PNA. He underwent DCCV with 120j x1 with conversion to NSR.   He reports he finished his abx and felt better after completion.  However, went to work outside in the yard and later had chills.  He is exposed to grandchildren.  Has increased cough, congestion and sinus pressure.  Continues to smoke. He missed a dose of Eliquis last night which is very unusual for him.   He denies chest pain, palpitations, dyspnea, PND, orthopnea, nausea, vomiting, dizziness, syncope, edema, weight gain, or early satiety.   Review of systems complete and found to be negative unless listed in HPI.   EP Information / Studies Reviewed:    EKG is ordered today. Personal review as below.  EKG Interpretation Date/Time:  Thursday May 11 2023 10:04:34 EST Ventricular Rate:  81 PR Interval:    QRS Duration:  120 QT Interval:  374 QTC Calculation: 434 R Axis:   -59  Text Interpretation: Atrial flutter with variable A-V block with premature ventricular or aberrantly conducted complexes Left axis deviation Confirmed by  Canary Brim ([id number]) on 05/11/2023 10:30:07 AM   Studies:  LHC 08/2021 > mild non-obs CAD, normal LVEDP Cardiac Monitor 10/2021 > SVT/NSVT, PVC's 16%  Cardiac Monitor 03/2022 > 11% PVC's ECHO 04/2022 > LVEF 55%, G1DD   Arrhythmia / AAD Atrial Fibrillation > initial dx ~08/2021 PVC's  DCCV 04/21/23    Risk Assessment/Calculations:    CHA2DS2-VASc Score = 4   This indicates a 4.8% annual risk of stroke. The patient's score is based upon: CHF History: 1 HTN History: 1 Diabetes History: 1 Stroke History: 0 Vascular Disease History: 1 Age Score: 0 Gender Score: 0        STOP-Bang Score:          Physical Exam:   VS:  BP 102/66   Pulse 62   Ht 5\' 9"  (1.753 m)   Wt 220 lb (99.8 kg)   SpO2 97%   BMI 32.49 kg/m    Wt Readings from Last 3 Encounters:  05/11/23 220 lb (99.8 kg)  04/27/23 227 lb 0.6 oz (103 kg)  04/21/23 228 lb 3.2 oz (103.5 kg)     GEN: Well nourished, well developed in no acute distress NECK: No JVD; No carotid bruits CARDIAC: Irregularly irregular rate and rhythm, no murmurs, rubs, gallops RESPIRATORY:  Clear to auscultation without rales, wheezing or rhonchi  ABDOMEN: Soft, non-tender, non-distended EXTREMITIES:  No edema; No deformity   ASSESSMENT AND PLAN:    Persistent Atrial Fibrillation  Atrial Flutter S/p DCCV 12/6 with restoration of NSR. CHA2DS2-VASc 4.  -continue OAC  for stroke prophylaxis  -continue flecainide 100mg  BID + Toprol 100mg  daily  -hold adjustment of above given controlled ventricular response, largely asymptomatic and recent illness > if remains in AFL, consider DCCV after next visit if no missed OAC -EKG with AFL, PVC's -given recent pulmonary illness, and missed dose of Eliquis last pm, can not offer him a DCCV > doubt it would hold with ongoing pulmonary illness -schedule visit with Dr. Elberta Fortis to discuss AF/AFL ablation   Secondary Hypercoagulable State  -continue eliquis 5mg  BID, dose reviewed and appropriate by  age/wt  Chronic Systolic HF Recovered EF  -euvolemic on exam   PVC's  18% burden on initial cardiac monitor, repeat with 11%  PNA  Tobacco Abuse  Recent RU/RM/RLL PNA with GGO, consolidation and part solid pulmonary nodules  -Rx's augmentin + doxycycline post ER visit 12/6  -smoking hx, will need repeat CXR in 1-2 months to ensure clearance. If not clearing, should be referred to Pulmonary for consideration of biopsy of nodules  -discussed with patient to talk with PCP about getting a repeat CXR in 1-2 weeks given ongoing pulmonary symptoms to ensure clearance of prior infiltrates / no worsening (expect CXR to lag behind what he is doing clinically)  OSA  -working with Dr. Mayford Knife on evaluation for the [facility] device  Follow up with EP APP in 4 weeks   Signed, Canary Brim, MSN, APRN, NP-C, AGACNP-BC [facility] - Electrophysiology  05/11/2023, 11:15 AM

## 2023-05-11 ENCOUNTER — Telehealth: Payer: Self-pay

## 2023-05-11 ENCOUNTER — Encounter: Payer: Self-pay | Admitting: Pulmonary Disease

## 2023-05-11 ENCOUNTER — Ambulatory Visit: Payer: PPO | Attending: Pulmonary Disease | Admitting: Pulmonary Disease

## 2023-05-11 VITALS — BP 102/66 | HR 62 | Ht 69.0 in | Wt 220.0 lb

## 2023-05-11 DIAGNOSIS — G4733 Obstructive sleep apnea (adult) (pediatric): Secondary | ICD-10-CM | POA: Diagnosis not present

## 2023-05-11 DIAGNOSIS — E782 Mixed hyperlipidemia: Secondary | ICD-10-CM | POA: Diagnosis not present

## 2023-05-11 DIAGNOSIS — I493 Ventricular premature depolarization: Secondary | ICD-10-CM | POA: Diagnosis not present

## 2023-05-11 DIAGNOSIS — I4819 Other persistent atrial fibrillation: Secondary | ICD-10-CM | POA: Diagnosis not present

## 2023-05-11 DIAGNOSIS — D6869 Other thrombophilia: Secondary | ICD-10-CM | POA: Diagnosis not present

## 2023-05-11 DIAGNOSIS — E1169 Type 2 diabetes mellitus with other specified complication: Secondary | ICD-10-CM | POA: Diagnosis not present

## 2023-05-11 DIAGNOSIS — I480 Paroxysmal atrial fibrillation: Secondary | ICD-10-CM

## 2023-05-11 DIAGNOSIS — Z720 Tobacco use: Secondary | ICD-10-CM

## 2023-05-11 DIAGNOSIS — Z125 Encounter for screening for malignant neoplasm of prostate: Secondary | ICD-10-CM | POA: Diagnosis not present

## 2023-05-11 NOTE — Patient Instructions (Addendum)
Medication Instructions:  Your physician recommends that you continue on your current medications as directed. Please refer to the Current Medication list given to you today.  *If you need a refill on your cardiac medications before your next appointment, please call your pharmacy*   Lab Work: None.  If you have labs (blood work) drawn today and your tests are completely normal, you will receive your results only by: MyChart Message (if you have MyChart) OR A paper copy in the mail If you have any lab test that is abnormal or we need to change your treatment, we will call you to review the results.   Testing/Procedures: None.   Follow-Up: At [facility], you and your health needs are our priority.  As part of our continuing mission to provide you with exceptional heart care, we have created designated Provider Care Teams.  These Care Teams include your primary Cardiologist (physician) and Advanced Practice Providers (APPs -  Physician Assistants and Nurse Practitioners) who all work together to provide you with the care you need, when you need it.  We recommend signing up for the patient portal called "MyChart".  Sign up information is provided on this After Visit Summary.  MyChart is used to connect with patients for Virtual Visits (Telemedicine).  Patients are able to view lab/test results, encounter notes, upcoming appointments, etc.  Non-urgent messages can be sent to your provider as well.   To learn more about what you can do with MyChart, go to [email or web address].    Your next appointment:   1 Month  Provider:   Earnest Rosier NP  Then, please make an appointment to see Dr. Elberta Fortis in 3 months to discuss afib/aflutter ablation.   Other Instructions: Please go to our [facility] Cardiology office at [facility], [address] [place], [place], [place] [address] early tomorrow to pick up eliquis samples. Phone # is [phone].

## 2023-05-11 NOTE — Telephone Encounter (Signed)
Patient requesting 1 week of samples of 5 mg eliquis. None available at [facility] or [facility] locations, call to [place] site which did have this dose available. Advised patient to present to [facility] clinic site tomorrow when staff is onsite and request samples, staff message sent to [facility] triage. Address and phone # of [place] office provided to patient who verbalizes understanding of plan.

## 2023-05-12 LAB — CBC WITH DIFFERENTIAL/PLATELET
Basophils Absolute: 0 10*3/uL (ref 0.0–0.2)
Basos: 0 %
EOS (ABSOLUTE): 0.2 10*3/uL (ref 0.0–0.4)
Eos: 4 %
Hematocrit: 47.8 % (ref 37.5–51.0)
Hemoglobin: 15.8 g/dL (ref 13.0–17.7)
Immature Grans (Abs): 0 10*3/uL (ref 0.0–0.1)
Immature Granulocytes: 0 %
Lymphocytes Absolute: 1.6 10*3/uL (ref 0.7–3.1)
Lymphs: 28 %
MCH: 32.8 pg (ref 26.6–33.0)
MCHC: 33.1 g/dL (ref 31.5–35.7)
MCV: 99 fL — ABNORMAL HIGH (ref 79–97)
Monocytes Absolute: 0.7 10*3/uL (ref 0.1–0.9)
Monocytes: 12 %
Neutrophils Absolute: 3.2 10*3/uL (ref 1.4–7.0)
Neutrophils: 56 %
Platelets: 139 10*3/uL — ABNORMAL LOW (ref 150–450)
RBC: 4.81 x10E6/uL (ref 4.14–5.80)
RDW: 13.1 % (ref 11.6–15.4)
WBC: 5.8 10*3/uL (ref 3.4–10.8)

## 2023-05-12 LAB — LIPID PANEL
Chol/HDL Ratio: 4.3 {ratio} (ref 0.0–5.0)
Cholesterol, Total: 159 mg/dL (ref 100–199)
HDL: 37 mg/dL — ABNORMAL LOW (ref >39)
LDL Chol Calc (NIH): 96 mg/dL (ref 0–99)
Triglycerides: 146 mg/dL (ref 0–149)
VLDL Cholesterol Cal: 26 mg/dL (ref 5–40)

## 2023-05-12 LAB — CMP14+EGFR
ALT: 23 IU/L (ref 0–44)
AST: 23 IU/L (ref 0–40)
Albumin: 4.4 g/dL (ref 3.8–4.9)
Alkaline Phosphatase: 114 IU/L (ref 44–121)
BUN/Creatinine Ratio: 18 (ref 9–20)
BUN: 15 mg/dL (ref 6–24)
Bilirubin Total: <0.2 mg/dL (ref 0.0–1.2)
CO2: 26 mmol/L (ref 20–29)
Calcium: 9.4 mg/dL (ref 8.7–10.2)
Chloride: 101 mmol/L (ref 96–106)
Creatinine, Ser: 0.84 mg/dL (ref 0.76–1.27)
Globulin, Total: 2.9 g/dL (ref 1.5–4.5)
Glucose: 125 mg/dL — ABNORMAL HIGH (ref 70–99)
Potassium: 4.9 mmol/L (ref 3.5–5.2)
Sodium: 140 mmol/L (ref 134–144)
Total Protein: 7.3 g/dL (ref 6.0–8.5)
eGFR: 100 mL/min/1.73

## 2023-05-12 LAB — HEMOGLOBIN A1C
Est. average glucose Bld gHb Est-mCnc: 157 mg/dL
Hgb A1c MFr Bld: 7.1 % — ABNORMAL HIGH (ref 4.8–5.6)

## 2023-05-12 LAB — PSA: Prostate Specific Ag, Serum: 0.7 ng/mL (ref 0.0–4.0)

## 2023-05-12 MED ORDER — APIXABAN 5 MG PO TABS: 5.0000 mg | ORAL_TABLET | Freq: Two times a day (BID) | ORAL | 0 refills | Status: DC

## 2023-05-12 NOTE — Addendum Note (Signed)
Addended by: Kerney Elbe on: 05/12/2023 08:26 AM   Modules accepted: Orders

## 2023-05-17 ENCOUNTER — Other Ambulatory Visit: Payer: Self-pay | Admitting: Internal Medicine

## 2023-05-17 DIAGNOSIS — I480 Paroxysmal atrial fibrillation: Secondary | ICD-10-CM

## 2023-05-18 NOTE — Telephone Encounter (Signed)
 Prescription refill request for Eliquis received. Indication:afib Last office visit: 05/11/23 Scr: 0.84 epic 05/11/23 Age: 58 Weight: 99kg

## 2023-05-23 ENCOUNTER — Encounter: Payer: Self-pay | Admitting: Internal Medicine

## 2023-05-23 ENCOUNTER — Ambulatory Visit (INDEPENDENT_AMBULATORY_CARE_PROVIDER_SITE_OTHER): Payer: PPO | Admitting: Internal Medicine

## 2023-05-23 VITALS — BP 134/88 | HR 86 | Ht 69.0 in | Wt 226.2 lb

## 2023-05-23 DIAGNOSIS — E1169 Type 2 diabetes mellitus with other specified complication: Secondary | ICD-10-CM | POA: Diagnosis not present

## 2023-05-23 DIAGNOSIS — J189 Pneumonia, unspecified organism: Secondary | ICD-10-CM

## 2023-05-23 DIAGNOSIS — Z7984 Long term (current) use of oral hypoglycemic drugs: Secondary | ICD-10-CM

## 2023-05-23 DIAGNOSIS — I480 Paroxysmal atrial fibrillation: Secondary | ICD-10-CM | POA: Diagnosis not present

## 2023-05-23 MED ORDER — BENZONATATE 100 MG PO CAPS: 100.0000 mg | ORAL_CAPSULE | Freq: Two times a day (BID) | ORAL | 0 refills | Status: DC | PRN

## 2023-05-23 NOTE — Assessment & Plan Note (Signed)
 Lab Results  Component Value Date   HGBA1C 7.1 (H) 05/11/2023   Uncontrolled, but improving Associated with HTN, HLD and obesity On Metformin  1000 mg twice daily Recently started Ozempic - now at 0.5 mg qw dose through patient assistance, increased to 1 mg qw now Advised to follow diabetic diet F/u CMP and lipid panel On statin and ARB now Diabetic eye exam: Advised to follow up with Ophthalmology for diabetic eye exam

## 2023-05-23 NOTE — Assessment & Plan Note (Addendum)
 Rate controlled with metoprolol now On flecainide 100 mg daily In sinus rhythm now On Eliquis for [facility] Follow-up with cardiology S/p cardioversion Planned to see EP cardiology for consideration of cardiac ablation

## 2023-05-23 NOTE — Patient Instructions (Signed)
 Please take Mucinex as needed for cough. Okay to take Tessalon for dry cough.  Please continue to take other medications as prescribed.  Please continue to follow low carb diet and perform moderate exercise/walking at least 150 mins/week.

## 2023-05-23 NOTE — Assessment & Plan Note (Signed)
 Recent ER visit for multifocal pneumonia - last CT chest reviewed Symptoms improved now, has persistent dry cough Tessalon as needed for cough Check repeat CT chest in the next week

## 2023-05-23 NOTE — Progress Notes (Signed)
 Established Patient Office Visit  Subjective:  Patient ID: Stephen Roberts, male    DOB: 09/04/1965  Age: 58 y.o. MRN: [id number]  CC:  Chief Complaint  Patient presents with   Follow-up    Pneumonia follow up feeling better    HPI Stephen Roberts is a 58 y.o. male with past medical history of atrial fibrillation,left parotid mass, IDA, type II DM, HLD, chronic neck pain and tobacco abuse who presents for f/u of recent ER visit for CAP.  He went to ER on 04/21/23 for cough, fever and dyspnea.  He also had fatigue and palpitations.  He was found to have multifocal pneumonia on CT chest.  He was given Augmentin  and doxycycline , which improved his symptoms except dry cough.  He is taking home remedy with honey with some relief. Denies any fever or chills currently.  He also had atrial fibrillation with AVR, likely due to underlying respiratory infection.  He has followed up with cardiology, and has been scheduled to see Dr. Inocencio for consideration of cardiac ablation.  He is currently taking flecainide , metoprolol  and Eliquis .  Past Medical History:  Diagnosis Date   A-fib (HCC)    Arthritis    Diabetes (HCC)    GERD (gastroesophageal reflux disease)    Heart murmur    IDA (iron  deficiency anemia)    OSA (obstructive sleep apnea)    moderate obstructive sleep apnea with an AHI of 18.1/h   Pneumonia 06/27/2006    Past Surgical History:  Procedure Laterality Date   ANTERIOR FUSION CERVICAL SPINE     CARDIOVERSION N/A 10/05/2021   Procedure: CARDIOVERSION;  Surgeon: Francyne Headland, MD;  Location: [facility];  Service: Cardiovascular;  Laterality: N/A;   CARDIOVERSION N/A 11/18/2021   Procedure: CARDIOVERSION;  Surgeon: Sheena Pugh, DO;  Location: [facility];  Service: Cardiovascular;  Laterality: N/A;   COLONOSCOPY WITH PROPOFOL  N/A 03/26/2020   Procedure: COLONOSCOPY WITH PROPOFOL ;  Surgeon: Shaaron Lamar HERO, MD;  Location: [facility];  Service: Endoscopy;  Laterality:  N/A;   disc in neck Left 05/2017   ESOPHAGEAL DILATION N/A 03/25/2020   Procedure: ESOPHAGEAL DILATION;  Surgeon: Golda Claudis PENNER, MD;  Location: [facility];  Service: Endoscopy;  Laterality: N/A;   ESOPHAGOGASTRODUODENOSCOPY (EGD) WITH PROPOFOL  N/A 03/25/2020   Procedure: ESOPHAGOGASTRODUODENOSCOPY (EGD) WITH PROPOFOL ;  Surgeon: Golda Claudis PENNER, MD;  Location: [facility];  Service: Endoscopy;  Laterality: N/A;   GIVENS CAPSULE STUDY N/A 04/13/2020   Procedure: GIVENS CAPSULE STUDY;  Surgeon: Shaaron Lamar HERO, MD;  Location: [facility];  Service: Endoscopy;  Laterality: N/A;  7:30am   INSERTION OF MESH N/A 01/23/2013   Procedure: INSERTION OF MESH;  Surgeon: Elon HERO Pacini, MD;  Location: [facility];  Service: General;  Laterality: N/A;   LEFT HEART CATH AND CORONARY ANGIOGRAPHY N/A 08/27/2021   Procedure: LEFT HEART CATH AND CORONARY ANGIOGRAPHY;  Surgeon: Jordan, Peter M, MD;  Location: [facility] CV [facility];  Service: Cardiovascular;  Laterality: N/A;   POLYPECTOMY  03/26/2020   Procedure: POLYPECTOMY;  Surgeon: Shaaron Lamar HERO, MD;  Location: [facility];  Service: Endoscopy;;   TONSILLECTOMY     as child   VENTRAL HERNIA REPAIR N/A 01/23/2013   Procedure: LAPAROSCOPIC MULTIPLE VENTRAL HERNIAS;  Surgeon: Elon HERO Pacini, MD;  Location: [facility];  Service: General;  Laterality: N/A;    Family History  Problem Relation Age of Onset   Depression Mother    Hearing loss Mother  Hyperlipidemia Mother    Hypertension Mother    Stroke Mother    Early death Father    Diabetes Brother    Pancreatic cancer Paternal Uncle    Colon cancer Neg Hx     Social History   Socioeconomic History   Marital status: Married    Spouse name: Not on file   Number of children: Not on file   Years of education: Not on file   Highest education level: Not on file  Occupational History   Not on file  Tobacco Use   Smoking status: Every Day    Current packs/day: 0.75    Average packs/day: 0.8  packs/day for 45.5 years (34.1 ttl pk-yrs)    Types: Cigarettes    Start date: 56   Smokeless tobacco: Never  Vaping Use   Vaping status: Never Used  Substance and Sexual Activity   Alcohol use: Yes    Alcohol/week: 4.0 standard drinks of alcohol    Types: 4 Cans of beer per week    Comment: moderately   Drug use: Not Currently    Types: Cocaine    Comment: states he has tried it all. Last used cocaine a few years ago.SABRA   Sexual activity: Yes  Other Topics Concern   Not on file  Social History Narrative   Not on file   Social Drivers of Health   Financial Resource Strain: Low Risk  (02/13/2023)   Overall Financial Resource Strain (CARDIA)    Difficulty of Paying Living Expenses: Not hard at all  Food Insecurity: Low Risk  (02/21/2023)   Received from [facility]   Hunger Vital Sign    Worried About Running Out of Food in the Last Year: Never true    Ran Out of Food in the Last Year: Never true  Transportation Needs: No Transportation Needs (02/21/2023)   Received from [facility]    In the past 12 months, has lack of reliable transportation kept you from medical appointments, meetings, work or from getting things needed for daily living? : No  Physical Activity: Insufficiently Active (02/13/2023)   Exercise Vital Sign    Days of Exercise per Week: 5 days    Minutes of Exercise per Session: 20 min  Stress: No Stress Concern Present (02/13/2023)   [employer] of Occupational Health - Occupational Stress Questionnaire    Feeling of Stress : Only a little  Social Connections: Socially Isolated (02/13/2023)   Social Connection and Isolation Panel [NHANES]    Frequency of Communication with Friends and Family: Never    Frequency of Social Gatherings with Friends and Family: Once a week    Attends Religious Services: Never    Database Administrator or Organizations: No    Attends Banker Meetings: Never    Marital Status: Married  Careers Information Officer Violence: Not At Risk (02/13/2023)   Humiliation, Afraid, Rape, and Kick questionnaire    Fear of Current or Ex-Partner: No    Emotionally Abused: No    Physically Abused: No    Sexually Abused: No    Outpatient Medications Prior to Visit  Medication Sig Dispense Refill   acetaminophen  (TYLENOL ) 650 MG CR tablet Take 1,300 mg by mouth every 8 (eight) hours as needed for pain.     albuterol  (VENTOLIN  HFA) 108 (90 Base) MCG/ACT inhaler Inhale 2 puffs into the lungs every 6 (six) hours as needed for wheezing or shortness of breath. 18 g 2  ELIQUIS  5 MG TABS tablet Take 1 tablet by mouth twice daily 180 tablet 1   esomeprazole  (NEXIUM ) 40 MG capsule Take 1 capsule (40 mg total) by mouth daily. 90 capsule 3   Ferrous Sulfate  (IRON ) 325 (65 Fe) MG TABS Take 1 tablet (325 mg total) by mouth 2 (two) times daily. 240 tablet 0   ferrous sulfate  325 (65 FE) MG EC tablet TAKE 1 TABLET(325 MG) BY MOUTH TWICE DAILY 60 tablet 3   flecainide  (TAMBOCOR ) 100 MG tablet TAKE 1 TABLET BY MOUTH EVERY 12 HOURS 180 tablet 2   fluticasone  (FLONASE ) 50 MCG/ACT nasal spray Place 2 sprays into both nostrils daily. 16 g 6   furosemide  (LASIX ) 20 MG tablet Take 1 tablet (20 mg total) by mouth daily. 90 tablet 3   HYDROcodone -acetaminophen  (NORCO/VICODIN) 5-325 MG tablet Take 1 tablet by mouth 3 (three) times daily as needed for moderate pain (pain score 4-6). 15 tablet 0   Lactobacillus Rhamnosus, GG, (PROBIOTIC COLIC) LIQD Take 1 tablet by mouth 4 (four) times a week.     metFORMIN  (GLUCOPHAGE ) 1000 MG tablet Take 1 tablet (1,000 mg total) by mouth 2 (two) times daily with a meal. 60 tablet 5   metoprolol  succinate (TOPROL -XL) 100 MG 24 hr tablet TAKE 1  BY MOUTH ONCE DAILY 90 tablet 1   Multiple Vitamin (MULTIVITAMIN) tablet Take 1 tablet by mouth daily.     potassium chloride  (KLOR-CON  M) 10 MEQ tablet Take 1 tablet by mouth once daily 90 tablet 3   pravastatin  (PRAVACHOL ) 20 MG tablet TAKE 1 TABLET BY MOUTH 3  TIMES EVERY WEEK 15 tablet 10   Semaglutide,0.25 or 0.5MG /DOS, (OZEMPIC, 0.25 OR 0.5 MG/DOSE,) 2 MG/1.5ML SOPN Inject 0.5 mg into the skin every 7 (seven) days. Through patient assistance.     tiZANidine  (ZANAFLEX ) 4 MG capsule Take 1 capsule (4 mg total) by mouth 3 (three) times daily as needed for muscle spasms. Do not drink alcohol or drive while taking this medication.  May cause drowsiness. 15 capsule 0   traZODone  (DESYREL ) 50 MG tablet Take 0.5-1 tablets (25-50 mg total) by mouth at bedtime as needed for sleep. 30 tablet 3   vitamin B-12 (CYANOCOBALAMIN ) 1000 MCG tablet Take 1,000 mcg by mouth daily.     amoxicillin -clavulanate (AUGMENTIN ) 875-125 MG tablet Take 1 tablet by mouth every 12 (twelve) hours. 14 tablet 0   doxycycline  (VIBRAMYCIN ) 100 MG capsule Take 1 capsule (100 mg total) by mouth 2 (two) times daily. 20 capsule 0   Facility-Administered Medications Prior to Visit  Medication Dose Route Frequency Provider Last Rate Last Admin   iron  sucrose (VENOFER ) 200 mg in sodium chloride  0.9 % 100 mL IVPB  200 mg Intravenous Once Pennington, Rebekah M, PA-C        Allergies  Allergen Reactions   Gabapentin Shortness Of Breath   Lyrica [Pregabalin] Shortness Of Breath   Crestor  [Rosuvastatin ] Other (See Comments)    myalgia   Farxiga  [Dapagliflozin ]     Developed UTI    ROS Review of Systems  Constitutional:  Negative for chills and fever.  HENT:  Negative for congestion, sinus pressure, sinus pain and sore throat.   Eyes:  Negative for pain and discharge.  Respiratory:  Positive for shortness of breath. Negative for cough.   Cardiovascular:  Positive for palpitations (Intermittent). Negative for chest pain.  Gastrointestinal:  Negative for diarrhea, nausea and vomiting.  Endocrine: Negative for polydipsia and polyuria.  Genitourinary:  Negative for dysuria and hematuria.  Musculoskeletal:  Positive for back pain and neck pain. Negative for neck stiffness.  Skin:  Negative  for rash.  Neurological:  Negative for dizziness, weakness and numbness.  Psychiatric/Behavioral:  Positive for sleep disturbance. Negative for agitation and behavioral problems.       Objective:    Physical Exam Vitals reviewed.  Constitutional:      General: He is not in acute distress.    Appearance: He is not diaphoretic.  HENT:     Head: Normocephalic and atraumatic.     Nose: No congestion.     Mouth/Throat:     Mouth: Mucous membranes are moist.  Eyes:     General: No scleral icterus.    Extraocular Movements: Extraocular movements intact.  Neck:     Vascular: No carotid bruit.  Cardiovascular:     Rate and Rhythm: Normal rate and regular rhythm.     Heart sounds: Normal heart sounds. No murmur heard. Pulmonary:     Breath sounds: Normal breath sounds. No wheezing or rales.  Abdominal:     Palpations: Abdomen is soft.     Tenderness: There is no abdominal tenderness.  Musculoskeletal:     Cervical back: Neck supple. No tenderness.     Right lower leg: No edema.     Left lower leg: No edema.  Skin:    General: Skin is warm.     Findings: No rash.  Neurological:     General: No focal deficit present.     Mental Status: He is alert and oriented to person, place, and time.     Sensory: No sensory deficit.     Motor: No weakness.  Psychiatric:        Mood and Affect: Mood normal.        Behavior: Behavior normal.     BP 134/88 (BP Location: Left Arm)   Pulse 86   Ht 5' 9 (1.753 m)   Wt 226 lb 3.2 oz (102.6 kg)   SpO2 96%   BMI 33.40 kg/m  Wt Readings from Last 3 Encounters:  05/23/23 226 lb 3.2 oz (102.6 kg)  05/11/23 220 lb (99.8 kg)  04/27/23 227 lb 0.6 oz (103 kg)    Lab Results  Component Value Date   TSH 1.910 03/08/2022   Lab Results  Component Value Date   WBC 5.8 05/11/2023   HGB 15.8 05/11/2023   HCT 47.8 05/11/2023   MCV 99 (H) 05/11/2023   PLT 139 (L) 05/11/2023   Lab Results  Component Value Date   NA 140 05/11/2023   K 4.9  05/11/2023   CO2 26 05/11/2023   GLUCOSE 125 (H) 05/11/2023   BUN 15 05/11/2023   CREATININE 0.84 05/11/2023   BILITOT <0.2 05/11/2023   ALKPHOS 114 05/11/2023   AST 23 05/11/2023   ALT 23 05/11/2023   PROT 7.3 05/11/2023   ALBUMIN 4.4 05/11/2023   CALCIUM  9.4 05/11/2023   ANIONGAP 12 04/21/2023   EGFR 100 05/11/2023   Lab Results  Component Value Date   CHOL 159 05/11/2023   Lab Results  Component Value Date   HDL 37 (L) 05/11/2023   Lab Results  Component Value Date   LDLCALC 96 05/11/2023   Lab Results  Component Value Date   TRIG 146 05/11/2023   Lab Results  Component Value Date   CHOLHDL 4.3 05/11/2023   Lab Results  Component Value Date   HGBA1C 7.1 (H) 05/11/2023      Assessment & Plan:  Problem List Items Addressed This Visit       Cardiovascular and Mediastinum   Paroxysmal atrial fibrillation (HCC)   Rate controlled with metoprolol  now On flecainide  100 mg daily In sinus rhythm now On Eliquis  for [facility] Follow-up with cardiology S/p cardioversion Planned to see EP cardiology for consideration of cardiac ablation        Respiratory   Multifocal pneumonia - Primary   Recent ER visit for multifocal pneumonia - last CT chest reviewed Symptoms improved now, has persistent dry cough Tessalon  as needed for cough Check repeat CT chest in the next week      Relevant Medications   benzonatate  (TESSALON ) 100 MG capsule     Endocrine   Type 2 diabetes mellitus with other specified complication (HCC)   Lab Results  Component Value Date   HGBA1C 7.1 (H) 05/11/2023   Uncontrolled, but improving Associated with HTN, HLD and obesity On Metformin  1000 mg twice daily Recently started Ozempic - now at 0.5 mg qw dose through patient assistance, increased to 1 mg qw now Advised to follow diabetic diet F/u CMP and lipid panel On statin and ARB now Diabetic eye exam: Advised to follow up with Ophthalmology for diabetic eye exam       Meds ordered  this encounter  Medications   benzonatate  (TESSALON ) 100 MG capsule    Sig: Take 1 capsule (100 mg total) by mouth 2 (two) times daily as needed for cough.    Dispense:  20 capsule    Refill:  0    Follow-up: Return if symptoms worsen or fail to improve.    Suzzane MARLA Blanch, MD

## 2023-05-30 ENCOUNTER — Ambulatory Visit (HOSPITAL_COMMUNITY)
Admission: RE | Admit: 2023-05-30 | Discharge: 2023-05-30 | Disposition: A | Discharge status: Home / Self Care | Payer: PPO | Source: Ambulatory Visit | Attending: Acute Care | Admitting: Acute Care

## 2023-05-30 DIAGNOSIS — R911 Solitary pulmonary nodule: Secondary | ICD-10-CM | POA: Diagnosis not present

## 2023-05-30 DIAGNOSIS — F17210 Nicotine dependence, cigarettes, uncomplicated: Secondary | ICD-10-CM | POA: Diagnosis not present

## 2023-05-30 DIAGNOSIS — Z87891 Personal history of nicotine dependence: Secondary | ICD-10-CM | POA: Diagnosis not present

## 2023-06-01 ENCOUNTER — Ambulatory Visit: Payer: PPO | Attending: Internal Medicine | Admitting: Internal Medicine

## 2023-06-01 VITALS — BP 140/100 | HR 54 | Ht 69.0 in | Wt 225.2 lb

## 2023-06-01 DIAGNOSIS — Z720 Tobacco use: Secondary | ICD-10-CM | POA: Diagnosis not present

## 2023-06-01 DIAGNOSIS — I77810 Thoracic aortic ectasia: Secondary | ICD-10-CM | POA: Diagnosis not present

## 2023-06-01 DIAGNOSIS — I10 Essential (primary) hypertension: Secondary | ICD-10-CM | POA: Diagnosis not present

## 2023-06-01 DIAGNOSIS — G4733 Obstructive sleep apnea (adult) (pediatric): Secondary | ICD-10-CM

## 2023-06-01 DIAGNOSIS — I480 Paroxysmal atrial fibrillation: Secondary | ICD-10-CM

## 2023-06-01 MED ORDER — LOSARTAN POTASSIUM 25 MG PO TABS: 25.0000 mg | ORAL_TABLET | Freq: Every day | ORAL | 3 refills | Status: DC

## 2023-06-01 NOTE — Patient Instructions (Signed)
Medication Instructions:  Your physician has recommended you make the following change in your medication:  START: losartan 25 mg by mouth once daily  *If you need a refill on your cardiac medications before your next appointment, please call your pharmacy*   Lab Work: IN 1-2 WEEKS: BMP  If you have labs (blood work) drawn today and your tests are completely normal, you will receive your results only by: MyChart Message (if you have MyChart) OR A paper copy in the mail If you have any lab test that is abnormal or we need to change your treatment, we will call you to review the results.   Testing/Procedures: [facility]- - Your physician has requested that you have an echocardiogram. Echocardiography is a painless test that uses sound waves to create images of your heart. It provides your doctor with information about the size and shape of your heart and how well your heart's chambers and valves are working. This procedure takes approximately one hour. There are no restrictions for this procedure. Please do NOT wear cologne, perfume, aftershave, or lotions (deodorant is allowed). Please arrive 15 minutes prior to your appointment time.  Please note: We ask at that you not bring children with you during ultrasound (echo/ vascular) testing. Due to room size and safety concerns, children are not allowed in the ultrasound rooms during exams. Our front office staff cannot provide observation of children in our lobby area while testing is being conducted. An adult accompanying a patient to their appointment will only be allowed in the ultrasound room at the discretion of the ultrasound technician under special circumstances. We apologize for any inconvenience.    Follow-Up: At [facility], you and your health needs are our priority.  As part of our continuing mission to provide you with exceptional heart care, we have created designated Provider Care Teams.  These Care Teams include your  primary Cardiologist (physician) and Advanced Practice Providers (APPs -  Physician Assistants and Nurse Practitioners) who all work together to provide you with the care you need, when you need it.   Your next appointment:   6 month(s)  Provider:   You will see one of the following Advanced Practice Providers on your designated Care Team:   [place], PA-C  Jacolyn Reedy, [place]

## 2023-06-01 NOTE — Progress Notes (Signed)
Cardiology Office Note:  .    Date:  06/01/2023  ID:  [name], DOB 06/11/1965, MRN [id number] PCP: Anabel Halon, MD  [facility] Providers Cardiologist:  Christell Constant, MD Electrophysiologist:  Will Jorja Loa, MD  Sleep Medicine:  Armanda Magic, MD     CC: Follow up ALF  History of Present Illness: .    Stephen Roberts is a 58 y.o. male  HTN, DM, Tobacco abuse, and IDA who is being seen 08/25/2021 for the evaluation of new PAF RVR CHADVASC of 2.   Found to have AF and systolic dysfunction. 2024: LVEF recovered.  Saw EP.  Had PVCs.  With AFL despite 12/16 DCCV.  Pending WC discussion of ablation.  Patient notes that he is doing ok.   Since last visit notes that he feels a little bit congested.  He notes that when he gets PNA his AF or AFL gets work. EKG showed AFL with rate control.   No chest pain or pressure.  Sometimes gets right sided chest pain. No SOB/DOE and no PND/Orthopnea.  No weight gain or leg swelling.   He can usually tell when he is in AF or ALF.  Feels off. Sometimes cannot tell.  Notes that when he smokes a cigarette he will go into atrial fibrillation.   Ambulatory BP variable- had some variation.    Relevant histories: .  Social from [place] ROS: As per HPI.   Studies Reviewed: .   Cardiac Studies & Procedures   CARDIAC CATHETERIZATION  CARDIAC CATHETERIZATION 08/27/2021  Narrative   LV end diastolic pressure is normal.  Mild nonobstructive CAD Normal LVEDP  Plan: medical therapy.  Findings Coronary Findings Diagnostic  Dominance: Right  Left Main Vessel was injected. Vessel is normal in caliber. Vessel is angiographically normal.  Left Anterior Descending Vessel was injected. Vessel is normal in caliber. There is mild diffuse disease throughout the vessel.  Left Circumflex Vessel was injected. Vessel is normal in caliber. The vessel exhibits minimal luminal irregularities.  Right Coronary Artery Vessel  was injected. Vessel is normal in caliber. There is mild diffuse disease throughout the vessel.  Intervention  No interventions have been documented.    ECHOCARDIOGRAM  ECHOCARDIOGRAM COMPLETE 04/18/2022  Narrative ECHOCARDIOGRAM REPORT    Patient Name:   Stephen Roberts Date of Exam: 04/18/2022 Medical Rec #:  [id number]        Height:       69.0 in Accession #:    [id number]       Weight:       238.0 lb Date of Birth:  [birth year]        BSA:          2.225 m Patient Age:    56 years         BP:           110/82 mmHg Patient Gender: M                HR:           76 bpm. Exam Location:  [address]  Procedure: 2D Echo, Cardiac Doppler and Color Doppler  Indications:    I42.9 Cardiomyopathy I48.91 Atrial Fibrillation  History:        Patient has prior history of Echocardiogram examinations, most recent 08/25/2021. Arrythmias:Atrial Fibrillation, Signs/Symptoms:Murmur; Risk Factors:Diabetes.  Sonographer:    Daphine Deutscher RDCS Referring Phys: [id number] WILL MARTIN CAMNITZ  IMPRESSIONS   1. Left ventricular ejection fraction, by  estimation, is 55%. The left ventricle has normal function. The left ventricle has no regional wall motion abnormalities. Left ventricular diastolic parameters are consistent with Grade I diastolic dysfunction (impaired relaxation). 2. Right ventricular systolic function is normal. The right ventricular size is mildly enlarged. 3. Left atrial size was moderately dilated. 4. Right atrial size was mildly dilated. 5. The mitral valve is normal in structure. Trivial mitral valve regurgitation. 6. The aortic valve is tricuspid. Aortic valve regurgitation is not visualized. No aortic stenosis is present. 7. Aortic dilatation noted. There is borderline dilatation of the aortic root, measuring 37 mm. There is mild dilatation of the ascending aorta, measuring 40 mm. 8. The inferior vena cava is normal in size with greater than 50% respiratory  variability, suggesting right atrial pressure of 3 mmHg.  Comparison(s): Compared to prior TTE on 08/2021, the LVEF has improved to 55% (previously 45-50%).  FINDINGS Left Ventricle: Left ventricular ejection fraction, by estimation, is 55%. The left ventricle has normal function. The left ventricle has no regional wall motion abnormalities. The left ventricular internal cavity size was normal in size. There is no left ventricular hypertrophy. Left ventricular diastolic parameters are consistent with Grade I diastolic dysfunction (impaired relaxation).  Right Ventricle: The right ventricular size is mildly enlarged. No increase in right ventricular wall thickness. Right ventricular systolic function is normal.  Left Atrium: Left atrial size was moderately dilated.  Right Atrium: Right atrial size was mildly dilated.  Pericardium: There is no evidence of pericardial effusion.  Mitral Valve: The mitral valve is normal in structure. Trivial mitral valve regurgitation.  Tricuspid Valve: The tricuspid valve is normal in structure. Tricuspid valve regurgitation is trivial.  Aortic Valve: The aortic valve is tricuspid. Aortic valve regurgitation is not visualized. No aortic stenosis is present.  Pulmonic Valve: The pulmonic valve was normal in structure. Pulmonic valve regurgitation is trivial.  Aorta: Aortic dilatation noted. There is borderline dilatation of the aortic root, measuring 37 mm. There is mild dilatation of the ascending aorta, measuring 40 mm.  Venous: The inferior vena cava is normal in size with greater than 50% respiratory variability, suggesting right atrial pressure of 3 mmHg.  IAS/Shunts: The atrial septum is grossly normal.   LEFT VENTRICLE PLAX 2D LVIDd:         5.00 cm   Diastology LVIDs:         3.60 cm   LV e' medial:    6.15 cm/s LV PW:         1.10 cm   LV E/e' medial:  12.7 LV IVS:        1.10 cm   LV e' lateral:   8.97 cm/s LVOT diam:     2.30 cm   LV E/e'  lateral: 8.7 LV SV:         89 LV SV Index:   40 LVOT Area:     4.15 cm   RIGHT VENTRICLE             IVC RV Basal diam:  4.50 cm     IVC diam: 1.30 cm RV S prime:     15.30 cm/s TAPSE (M-mode): 3.1 cm  LEFT ATRIUM             Index        RIGHT ATRIUM           Index LA diam:        4.70 cm 2.11 cm/m   RA Area:  16.30 cm LA Vol (A2C):   61.6 ml 27.69 ml/m  RA Volume:   45.60 ml  20.50 ml/m LA Vol (A4C):   65.1 ml 29.26 ml/m LA Biplane Vol: 65.0 ml 29.22 ml/m AORTIC VALVE LVOT Vmax:   100.25 cm/s LVOT Vmean:  68.400 cm/s LVOT VTI:    0.215 m  AORTA Ao Root diam: 3.70 cm Ao Asc diam:  4.00 cm  MITRAL VALVE                TRICUSPID VALVE MV Area (PHT): 3.95 cm     TR Peak grad:   32.7 mmHg MV Decel Time: 192 msec     TR Vmax:        286.00 cm/s MV E velocity: 78.10 cm/s MV A velocity: 102.50 cm/s  SHUNTS MV E/A ratio:  0.76         Systemic VTI:  0.22 m Systemic Diam: 2.30 cm  Laurance Flatten MD Electronically signed by Laurance Flatten MD Signature Date/Time: 04/18/2022/3:22:38 PM    Final   MONITORS  LONG TERM MONITOR (3-14 DAYS) 04/13/2022  Narrative Patch Wear Time:  10 days and 5 hours  Predominant rhythm was sinus rhythm 11.3% ventricular ectopy Less than 1% supraventricular ectopy Triggered episodes associated with sinus rhythm and sinus rhythm with PVCs  Will Camnitz, MD           Physical Exam:    VS:  BP (!) 140/100 (BP Location: Left Arm)   Pulse (!) 54   Ht 5\' 9"  (1.753 m)   Wt 225 lb 3.2 oz (102.2 kg)   SpO2 95%   BMI 33.26 kg/m    Wt Readings from Last 3 Encounters:  06/01/23 225 lb 3.2 oz (102.2 kg)  05/23/23 226 lb 3.2 oz (102.6 kg)  05/11/23 220 lb (99.8 kg)    Gen: No distress, obesity   Neck: No JVD Cardiac: No Rubs or Gallops, no Murmur, IRIR bradycardia, +2 radial pulses Respiratory: Clear to auscultation bilaterally, normal effort, normal  respiratory rate GI: Soft, nontender, non-distended  MS: No  edema;   moves all extremities Integument: Skin feels warm Neuro:  At time of evaluation, alert and oriented to person/place/time/situation  Psych: Normal affect, patient feels ok   ASSESSMENT AND PLAN: .    Persistent Atrial Fibrillation  Atrial Flutter PVC- Frequent S/p DCCV 12/6 with restoration of NSR. CHA2DS2-VASc 4; now back in AF - continue DOAC -continue flecainide 100mg  BID + Toprol 100mg  daily  - has had intermittent AFL, will not pursue DCCV but would benefit from AF/AFL ablation (has WC f/u - discussed exercise, tobacco, alcohol, and OSA - discussed obesity, he is on Ozempic   Secondary Hypercoagulable State  -continue eliquis 5mg  BID   HF- Recovered EF Mild Aortic dilation- 40 mm in 2023 HTN - continue current meds - repeat echocardiogram- aortic dilation - losartan 25 mg and BMP in 1-2 week in [place]    Tobacco Abuse  - discussed smoking cessation and the relationship to AFL and AF   OSA  -working with Dr. Mayford Knife on evaluation for the [facility] device  Hoping he will get ablation. July f/u with [place] pracitioner  Riley Lam, MD FASE [facility] Cardiologist [facility]  [address] [place], [address] [place], [place] [address] [phone]  8:16 AM

## 2023-06-07 ENCOUNTER — Ambulatory Visit: Payer: PPO | Admitting: Pulmonary Disease

## 2023-06-08 ENCOUNTER — Telehealth: Payer: Self-pay | Admitting: Internal Medicine

## 2023-06-08 ENCOUNTER — Other Ambulatory Visit: Payer: Self-pay

## 2023-06-08 DIAGNOSIS — F17210 Nicotine dependence, cigarettes, uncomplicated: Secondary | ICD-10-CM

## 2023-06-08 DIAGNOSIS — Z87891 Personal history of nicotine dependence: Secondary | ICD-10-CM

## 2023-06-08 DIAGNOSIS — Z122 Encounter for screening for malignant neoplasm of respiratory organs: Secondary | ICD-10-CM

## 2023-06-08 NOTE — Telephone Encounter (Signed)
Pt meds received  Pt will come for pu tomorrow 1/24

## 2023-06-19 NOTE — Progress Notes (Signed)
 Electrophysiology Office Note:   Date:  06/20/2023  ID:  Stephen Roberts, DOB [birth year], MRN [id number]  Primary Cardiologist: Stanly DELENA Leavens, MD Primary Heart Failure: None Electrophysiologist: Will Gladis Norton, MD      History of Present Illness:   Stephen Roberts is a 58 y.o. male with h/o AF, PVC's, tobacco abuse, OSA (intolerant to CPAP), ETOH abuse, DM & PNA in 04/2023 seen today for routine electrophysiology followup.   Seen in ER in 04/2023 with PNA, AF > DCCV 120j X1 with restoration of NSR. Followed up in clinic 05/11/23 and had finished abx and was recovering. EKG at that time showed AFL with PVC's.   Since last being seen in our clinic the patient reports he has recovered very well from his PNA.  He is breathing better.  Continues to feel a strange sensation in the right side of his chest (points to right of mid-sternal).  He reports his CXR was improved.   He denies chest pain, palpitations, dyspnea, PND, orthopnea, nausea, vomiting, dizziness, syncope, edema, weight gain, or early satiety.   Review of systems complete and found to be negative unless listed in HPI.   EP Information / Studies Reviewed:    EKG is ordered today. Personal review as below.  EKG Interpretation Date/Time:  Tuesday June 20 2023 08:58:01 EST Ventricular Rate:  78 PR Interval:    QRS Duration:  116 QT Interval:  414 QTC Calculation: 471 R Axis:   -70  Text Interpretation: Coarse Atrial fibrillation vs AFL with premature ventricular or aberrantly conducted complexes Left axis deviation Confirmed by Aniceto Jarvis ([id number]) on 06/20/2023 9:01:20 AM   Studies:  LHC 08/2021 > mild non-obs CAD, normal LVEDP Cardiac Monitor 10/2021 > SVT/NSVT, PVC's 16%  Cardiac Monitor 03/2022 > 11% PVC's ECHO 04/2022 > LVEF 55%, G1DD     Arrhythmia / AAD Atrial Fibrillation > initial dx ~08/2021 PVC's  DCCV 04/21/23   Risk Assessment/Calculations:    CHA2DS2-VASc Score = 4   This indicates a  4.8% annual risk of stroke. The patient's score is based upon: CHF History: 1 HTN History: 1 Diabetes History: 1 Stroke History: 0 Vascular Disease History: 1 Age Score: 0 Gender Score: 0        STOP-Bang Score:          Physical Exam:   VS:  BP 130/80 (BP Location: Left Arm, Patient Position: Sitting, Cuff Size: Large)   Pulse 84   Ht 5' 9 (1.753 m)   Wt 217 lb (98.4 kg)   SpO2 97%   BMI 32.05 kg/m    Wt Readings from Last 3 Encounters:  06/20/23 217 lb (98.4 kg)  06/01/23 225 lb 3.2 oz (102.2 kg)  05/23/23 226 lb 3.2 oz (102.6 kg)     GEN: Well nourished, well developed in no acute distress NECK: No JVD; No carotid bruits CARDIAC: Irregularly irregular rate and rhythm, no murmurs, rubs, gallops RESPIRATORY:  Clear to auscultation without rales, wheezing or rhonchi  ABDOMEN: Soft, non-tender, non-distended EXTREMITIES:  No edema; No deformity   ASSESSMENT AND PLAN:    Persistent Atrial Fibrillation  Atrial Flutter S/p DCCV 12/6 with restoration of NSR. CHA2DS2-VASc 4.  -continue flecainide  100mg  BID + Toprol  100 mg daily  -suspect recent illness flared AF, but may need to consider flecainide  failure -OAC for stroke prophylaxis, no missed doses in last month -has an appt in March with Dr. Norton to discuss AF / AFL ablation  -continue Toprol  100mg  daily  -  remains in coarse AF vs AFL on EKG, plan for DCCV as he has recovered from recent illness -pre-procedure labs  Secondary Hypercoagulable State  -continue Eliquis , dose reviewed and appropriate by age/wt  PVC's  18% burden on monitor, repeat with 11% -Toprol  as above  OSA  -working with Dr. Shlomo on [place] device    Follow up with Dr. Inocencio  in March as planned    Informed Consent   Shared Decision Making/Informed Consent The risks (stroke, cardiac arrhythmias rarely resulting in the need for a temporary or permanent pacemaker, skin irritation or burns and complications associated with conscious  sedation including aspiration, arrhythmia, respiratory failure and death), benefits (restoration of normal sinus rhythm) and alternatives of a direct current cardioversion were explained in detail to Stephen Roberts and he agrees to proceed.       Signed, Daphne Barrack, NP-C, AGACNP-BC [facility] - Electrophysiology  06/20/2023, 9:14 AM

## 2023-06-19 NOTE — H&P (View-Only) (Signed)
 Electrophysiology Office Note:   Date:  06/20/2023  ID:  Stephen Roberts, DOB [birth year], MRN [id number]  Primary Cardiologist: Christell Constant, MD Primary Heart Failure: None Electrophysiologist: Will Jorja Loa, MD      History of Present Illness:   Stephen Roberts is a 58 y.o. male with h/o AF, PVC's, tobacco abuse, OSA (intolerant to CPAP), ETOH abuse, DM & PNA in 04/2023 seen today for routine electrophysiology followup.   Seen in ER in 04/2023 with PNA, AF > DCCV 120j X1 with restoration of NSR. Followed up in clinic 05/11/23 and had finished abx and was recovering. EKG at that time showed AFL with PVC's.   Since last being seen in our clinic the patient reports he has recovered very well from his PNA.  He is breathing better.  Continues to feel a strange sensation in the right side of his chest (points to right of mid-sternal).  He reports his CXR was improved.   He denies chest pain, palpitations, dyspnea, PND, orthopnea, nausea, vomiting, dizziness, syncope, edema, weight gain, or early satiety.   Review of systems complete and found to be negative unless listed in HPI.   EP Information / Studies Reviewed:    EKG is ordered today. Personal review as below.  EKG Interpretation Date/Time:  Tuesday June 20 2023 08:58:01 EST Ventricular Rate:  78 PR Interval:    QRS Duration:  116 QT Interval:  414 QTC Calculation: 471 R Axis:   -70  Text Interpretation: Coarse Atrial fibrillation vs AFL with premature ventricular or aberrantly conducted complexes Left axis deviation Confirmed by Canary Brim ([id number]) on 06/20/2023 9:01:20 AM   Studies:  LHC 08/2021 > mild non-obs CAD, normal LVEDP Cardiac Monitor 10/2021 > SVT/NSVT, PVC's 16%  Cardiac Monitor 03/2022 > 11% PVC's ECHO 04/2022 > LVEF 55%, G1DD     Arrhythmia / AAD Atrial Fibrillation > initial dx ~08/2021 PVC's  DCCV 04/21/23   Risk Assessment/Calculations:    CHA2DS2-VASc Score = 4   This indicates a  4.8% annual risk of stroke. The patient's score is based upon: CHF History: 1 HTN History: 1 Diabetes History: 1 Stroke History: 0 Vascular Disease History: 1 Age Score: 0 Gender Score: 0        STOP-Bang Score:          Physical Exam:   VS:  BP 130/80 (BP Location: Left Arm, Patient Position: Sitting, Cuff Size: Large)   Pulse 84   Ht 5\' 9"  (1.753 m)   Wt 217 lb (98.4 kg)   SpO2 97%   BMI 32.05 kg/m    Wt Readings from Last 3 Encounters:  06/20/23 217 lb (98.4 kg)  06/01/23 225 lb 3.2 oz (102.2 kg)  05/23/23 226 lb 3.2 oz (102.6 kg)     GEN: Well nourished, well developed in no acute distress NECK: No JVD; No carotid bruits CARDIAC: Irregularly irregular rate and rhythm, no murmurs, rubs, gallops RESPIRATORY:  Clear to auscultation without rales, wheezing or rhonchi  ABDOMEN: Soft, non-tender, non-distended EXTREMITIES:  No edema; No deformity   ASSESSMENT AND PLAN:    Persistent Atrial Fibrillation  Atrial Flutter S/p DCCV 12/6 with restoration of NSR. CHA2DS2-VASc 4.  -continue flecainide 100mg  BID + Toprol 100 mg daily  -suspect recent illness flared AF, but may need to consider flecainide failure -OAC for stroke prophylaxis, no missed doses in last month -has an appt in March with Dr. Elberta Fortis to discuss AF / AFL ablation  -continue Toprol 100mg  daily  -  remains in coarse AF vs AFL on EKG, plan for DCCV as he has recovered from recent illness -pre-procedure labs  Secondary Hypercoagulable State  -continue Eliquis, dose reviewed and appropriate by age/wt  PVC's  18% burden on monitor, repeat with 11% -Toprol as above  OSA  -working with Dr. Mayford Knife on [place] device    Follow up with Dr. Elberta Fortis  in March as planned    Informed Consent   Shared Decision Making/Informed Consent The risks (stroke, cardiac arrhythmias rarely resulting in the need for a temporary or permanent pacemaker, skin irritation or burns and complications associated with conscious  sedation including aspiration, arrhythmia, respiratory failure and death), benefits (restoration of normal sinus rhythm) and alternatives of a direct current cardioversion were explained in detail to Mr. Pierpoint and he agrees to proceed.       Signed, Canary Brim, NP-C, AGACNP-BC [facility] - Electrophysiology  06/20/2023, 9:14 AM

## 2023-06-20 ENCOUNTER — Ambulatory Visit: Payer: PPO | Attending: Pulmonary Disease | Admitting: Pulmonary Disease

## 2023-06-20 ENCOUNTER — Encounter: Payer: Self-pay | Admitting: Pulmonary Disease

## 2023-06-20 ENCOUNTER — Encounter: Payer: Self-pay | Admitting: *Deleted

## 2023-06-20 VITALS — BP 130/80 | HR 84 | Ht 69.0 in | Wt 217.0 lb

## 2023-06-20 DIAGNOSIS — D6869 Other thrombophilia: Secondary | ICD-10-CM

## 2023-06-20 DIAGNOSIS — I493 Ventricular premature depolarization: Secondary | ICD-10-CM

## 2023-06-20 DIAGNOSIS — I4819 Other persistent atrial fibrillation: Secondary | ICD-10-CM | POA: Diagnosis not present

## 2023-06-20 NOTE — Patient Instructions (Signed)
Medication Instructions:  Your physician recommends that you continue on your current medications as directed. Please refer to the Current Medication list given to you today.  *If you need a refill on your cardiac medications before your next appointment, please call your pharmacy*  Lab Work: BMET, CBC-TODAY If you have labs (blood work) drawn today and your tests are completely normal, you will receive your results only by: MyChart Message (if you have MyChart) OR A paper copy in the mail If you have any lab test that is abnormal or we need to change your treatment, we will call you to review the results.   Testing/Procedures: Your physician has recommended that you have a Cardioversion (DCCV). Electrical Cardioversion uses a jolt of electricity to your heart either through paddles or wired patches attached to your chest. This is a controlled, usually prescheduled, procedure. Defibrillation is done under light anesthesia in the hospital, and you usually go home the day of the procedure. This is done to get your heart back into a normal rhythm. You are not awake for the procedure. Please see the instruction sheet given to you today.   Follow-Up: At [facility], you and your health needs are our priority.  As part of our continuing mission to provide you with exceptional heart care, we have created designated Provider Care Teams.  These Care Teams include your primary Cardiologist (physician) and Advanced Practice Providers (APPs -  Physician Assistants and Nurse Practitioners) who all work together to provide you with the care you need, when you need it.  Your next appointment:   As previously scheduled

## 2023-06-22 ENCOUNTER — Ambulatory Visit (HOSPITAL_COMMUNITY)
Admission: RE | Admit: 2023-06-22 | Discharge: 2023-06-22 | Disposition: A | Discharge status: Home / Self Care | Payer: PPO | Source: Ambulatory Visit | Attending: Internal Medicine | Admitting: Internal Medicine

## 2023-06-22 DIAGNOSIS — I77810 Thoracic aortic ectasia: Secondary | ICD-10-CM | POA: Insufficient documentation

## 2023-06-22 DIAGNOSIS — Z720 Tobacco use: Secondary | ICD-10-CM | POA: Diagnosis not present

## 2023-06-22 DIAGNOSIS — G4733 Obstructive sleep apnea (adult) (pediatric): Secondary | ICD-10-CM | POA: Diagnosis not present

## 2023-06-22 DIAGNOSIS — I480 Paroxysmal atrial fibrillation: Secondary | ICD-10-CM | POA: Insufficient documentation

## 2023-06-22 DIAGNOSIS — I10 Essential (primary) hypertension: Secondary | ICD-10-CM | POA: Insufficient documentation

## 2023-06-22 LAB — ECHOCARDIOGRAM COMPLETE
Area-P 1/2: 4.15 cm2
S' Lateral: 3.2 cm

## 2023-06-22 NOTE — Progress Notes (Signed)
*  PRELIMINARY RESULTS* Echocardiogram 2D Echocardiogram has been performed.  Bernis Brisker 06/22/2023, 9:26 AM

## 2023-06-26 ENCOUNTER — Telehealth: Payer: Self-pay | Admitting: Internal Medicine

## 2023-06-26 NOTE — Telephone Encounter (Signed)
 Patient states he and his wife are sick and not able to have cardioversion performed this week.  DCCV rescheduled for 07/05/2023 at 9:00 AM with arrival time of 8:00 AM.  Patient will have pre-procedure labs drawn this week.

## 2023-06-26 NOTE — Telephone Encounter (Signed)
  Pt said him and his wife is both sick and would like to reschedule his procedure DCCV

## 2023-06-28 DIAGNOSIS — I493 Ventricular premature depolarization: Secondary | ICD-10-CM | POA: Diagnosis not present

## 2023-06-28 DIAGNOSIS — D6869 Other thrombophilia: Secondary | ICD-10-CM | POA: Diagnosis not present

## 2023-06-28 DIAGNOSIS — I4819 Other persistent atrial fibrillation: Secondary | ICD-10-CM | POA: Diagnosis not present

## 2023-06-29 LAB — BASIC METABOLIC PANEL
BUN/Creatinine Ratio: 27 — ABNORMAL HIGH (ref 9–20)
BUN: 22 mg/dL (ref 6–24)
CO2: 23 mmol/L (ref 20–29)
Calcium: 10.2 mg/dL (ref 8.7–10.2)
Chloride: 100 mmol/L (ref 96–106)
Creatinine, Ser: 0.82 mg/dL (ref 0.76–1.27)
Glucose: 75 mg/dL (ref 70–99)
Potassium: 4.6 mmol/L (ref 3.5–5.2)
Sodium: 140 mmol/L (ref 134–144)
eGFR: 101 mL/min/{1.73_m2} (ref >59)

## 2023-06-29 LAB — CBC
Hematocrit: 48.2 % (ref 37.5–51.0)
Hemoglobin: 16.2 g/dL (ref 13.0–17.7)
MCH: 33.9 pg — ABNORMAL HIGH (ref 26.6–33.0)
MCHC: 33.6 g/dL (ref 31.5–35.7)
MCV: 101 fL — ABNORMAL HIGH (ref 79–97)
Platelets: 164 10*3/uL (ref 150–450)
RBC: 4.78 x10E6/uL (ref 4.14–5.80)
RDW: 13.1 % (ref 11.6–15.4)
WBC: 7.6 10*3/uL (ref 3.4–10.8)

## 2023-07-07 NOTE — Progress Notes (Signed)
 Spoke to patient and instructed them to come at 08:45  and to be NPO after 0000. Medications reviewed.   Confirmed that patient will have a ride home and someone to stay with them for 24 hours after the procedure.

## 2023-07-09 NOTE — Anesthesia Preprocedure Evaluation (Signed)
 Anesthesia Evaluation  Patient identified by MRN, date of birth, ID band Patient awake    Reviewed: Allergy & Precautions, NPO status , Patient's Chart, lab work & pertinent test results  History of Anesthesia Complications Negative for: history of anesthetic complications  Airway Mallampati: III  TM Distance: >3 FB Neck ROM: Full    Dental  (+) Edentulous Upper, Dental Advisory Given, Missing   Pulmonary sleep apnea , pneumonia, resolved, COPD, Current Smoker and Patient abstained from smoking.   Pulmonary exam normal breath sounds clear to auscultation       Cardiovascular hypertension, Pt. on medications and Pt. on home beta blockers + dysrhythmias Atrial Fibrillation + Valvular Problems/Murmurs  Rhythm:Irregular Rate:Normal  Echo 06/2023  1. Left ventricular ejection fraction, by estimation, is 55 to 60%. The left ventricle has normal function. The left ventricle has no regional wall motion abnormalities. There is mild concentric left ventricular hypertrophy. Left ventricular diastolic parameters are indeterminate.   2. Right ventricular systolic function is normal. The right ventricular size is normal. There is normal pulmonary artery systolic pressure. The estimated right ventricular systolic pressure is 26.8 mmHg.   3. Left atrial size was moderately dilated.   4. Right atrial size was mildly dilated.   5. The mitral valve is grossly normal. Trivial mitral valve regurgitation.   6. The aortic valve is tricuspid. Aortic valve regurgitation is not visualized. No aortic stenosis is present.   7. The inferior vena cava is normal in size with greater than 50% respiratory variability, suggesting right atrial pressure of 3 mmHg.   Comparison(s): Prior images reviewed side by side. LVEF normal range at  55-60%. Normal aortic root (37 mm).     Cath 08/2021  LV end diastolic pressure is normal.   1. Mild nonobstructive CAD 2. Normal  LVEDP   Plan: medical therapy.    Neuro/Psych  PSYCHIATRIC DISORDERS  Depression     Neuromuscular disease    GI/Hepatic Neg liver ROS, hiatal hernia,GERD  Medicated,,  Endo/Other  diabetes    Renal/GU negative Renal ROS     Musculoskeletal  (+) Arthritis ,    Abdominal  (+) + obese  Peds  Hematology  (+) Blood dyscrasia, anemia eliquis  Lab Results      Component                Value               Date                      WBC                      6.1                 11/16/2021                HGB                      13.7                11/16/2021                HCT                      40.3                11/16/2021  MCV                      92.4                11/16/2021                PLT                      118 (L)             11/16/2021              Anesthesia Other Findings   Reproductive/Obstetrics                             Anesthesia Physical Anesthesia Plan  ASA: 3  Anesthesia Plan: General   Post-op Pain Management: Minimal or no pain anticipated   Induction: Intravenous  PONV Risk Score and Plan: 1 and Treatment may vary due to age or medical condition, Propofol infusion and TIVA  Airway Management Planned: Natural Airway  Additional Equipment: None  Intra-op Plan:   Post-operative Plan:   Informed Consent: I have reviewed the patients History and Physical, chart, labs and discussed the procedure including the risks, benefits and alternatives for the proposed anesthesia with the patient or authorized representative who has indicated his/her understanding and acceptance.     Dental advisory given  Plan Discussed with: CRNA  Anesthesia Plan Comments:         Anesthesia Quick Evaluation

## 2023-07-10 ENCOUNTER — Ambulatory Visit (HOSPITAL_COMMUNITY): Payer: Self-pay | Admitting: Anesthesiology

## 2023-07-10 ENCOUNTER — Encounter (HOSPITAL_COMMUNITY): Payer: Self-pay | Admitting: Cardiovascular Disease

## 2023-07-10 ENCOUNTER — Other Ambulatory Visit: Payer: Self-pay

## 2023-07-10 ENCOUNTER — Encounter (HOSPITAL_COMMUNITY)
Admission: RE | Disposition: A | Discharge status: Home / Self Care | Payer: Self-pay | Source: Home / Self Care | Attending: Cardiovascular Disease

## 2023-07-10 ENCOUNTER — Ambulatory Visit (HOSPITAL_BASED_OUTPATIENT_CLINIC_OR_DEPARTMENT_OTHER): Payer: Self-pay | Admitting: Anesthesiology

## 2023-07-10 ENCOUNTER — Ambulatory Visit (HOSPITAL_COMMUNITY)
Admission: RE | Admit: 2023-07-10 | Discharge: 2023-07-10 | Disposition: A | Discharge status: Home / Self Care | Payer: PPO | Attending: Cardiovascular Disease | Admitting: Cardiovascular Disease

## 2023-07-10 DIAGNOSIS — Z87891 Personal history of nicotine dependence: Secondary | ICD-10-CM | POA: Diagnosis not present

## 2023-07-10 DIAGNOSIS — E119 Type 2 diabetes mellitus without complications: Secondary | ICD-10-CM | POA: Insufficient documentation

## 2023-07-10 DIAGNOSIS — Z79899 Other long term (current) drug therapy: Secondary | ICD-10-CM | POA: Diagnosis not present

## 2023-07-10 DIAGNOSIS — J449 Chronic obstructive pulmonary disease, unspecified: Secondary | ICD-10-CM | POA: Insufficient documentation

## 2023-07-10 DIAGNOSIS — K219 Gastro-esophageal reflux disease without esophagitis: Secondary | ICD-10-CM | POA: Diagnosis not present

## 2023-07-10 DIAGNOSIS — I10 Essential (primary) hypertension: Secondary | ICD-10-CM | POA: Diagnosis not present

## 2023-07-10 DIAGNOSIS — G4733 Obstructive sleep apnea (adult) (pediatric): Secondary | ICD-10-CM | POA: Diagnosis not present

## 2023-07-10 DIAGNOSIS — I4892 Unspecified atrial flutter: Secondary | ICD-10-CM | POA: Diagnosis not present

## 2023-07-10 DIAGNOSIS — I4819 Other persistent atrial fibrillation: Secondary | ICD-10-CM | POA: Diagnosis not present

## 2023-07-10 DIAGNOSIS — I4891 Unspecified atrial fibrillation: Secondary | ICD-10-CM

## 2023-07-10 DIAGNOSIS — Z7901 Long term (current) use of anticoagulants: Secondary | ICD-10-CM | POA: Insufficient documentation

## 2023-07-10 DIAGNOSIS — D6869 Other thrombophilia: Secondary | ICD-10-CM | POA: Insufficient documentation

## 2023-07-10 DIAGNOSIS — I493 Ventricular premature depolarization: Secondary | ICD-10-CM | POA: Diagnosis not present

## 2023-07-10 DIAGNOSIS — F17210 Nicotine dependence, cigarettes, uncomplicated: Secondary | ICD-10-CM | POA: Diagnosis not present

## 2023-07-10 DIAGNOSIS — I480 Paroxysmal atrial fibrillation: Secondary | ICD-10-CM

## 2023-07-10 HISTORY — PX: CARDIOVERSION: EP1203

## 2023-07-10 LAB — GLUCOSE, CAPILLARY: Glucose-Capillary: 106 mg/dL — ABNORMAL HIGH (ref 70–99)

## 2023-07-10 SURGERY — CARDIOVERSION (CATH LAB)
Anesthesia: General

## 2023-07-10 MED ORDER — LIDOCAINE 2% (20 MG/ML) 5 ML SYRINGE
INTRAMUSCULAR | Status: DC | PRN
  Administered 2023-07-10: 50 mg via INTRAVENOUS

## 2023-07-10 MED ORDER — PROPOFOL 10 MG/ML IV BOLUS
INTRAVENOUS | Status: DC | PRN
  Administered 2023-07-10: 100 mg via INTRAVENOUS

## 2023-07-10 MED ORDER — SODIUM CHLORIDE 0.9% FLUSH: 3.0000 mL | INTRAVENOUS | Status: DC | PRN

## 2023-07-10 MED ORDER — SODIUM CHLORIDE 0.9% FLUSH: 3.0000 mL | Freq: Two times a day (BID) | INTRAVENOUS | Status: DC

## 2023-07-10 SURGICAL SUPPLY — 1 items: PAD DEFIB RADIO PHYSIO CONN (PAD) ×2 IMPLANT

## 2023-07-10 NOTE — Interval H&P Note (Signed)
 History and Physical Interval Note:  07/10/2023 9:18 AM  Stephen Roberts  has presented today for surgery, with the diagnosis of ATRIAL FIB AND FLUTTER.  The various methods of treatment have been discussed with the patient and family. After consideration of risks, benefits and other options for treatment, the patient has consented to  Procedure(s): CARDIOVERSION (N/A) as a surgical intervention.  The patient's history has been reviewed, patient examined, no change in status, stable for surgery.  I have reviewed the patient's chart and labs.  Questions were answered to the patient's satisfaction.     Charlton Haws

## 2023-07-10 NOTE — Transfer of Care (Signed)
 Immediate Anesthesia Transfer of Care Note  Patient: Stephen Roberts  Procedure(s) Performed: CARDIOVERSION  Patient Location: PACU and Short Stay  Anesthesia Type:General  Level of Consciousness: awake and alert   Airway & Oxygen Therapy: Patient Spontanous Breathing  Post-op Assessment: Report given to RN  Post vital signs: Reviewed and stable see chart  Last Vitals:  Vitals Value Taken Time  BP    Temp    Pulse    Resp    SpO2      Last Pain:  Vitals:   07/10/23 0911  TempSrc:   PainSc: 0-No pain         Complications: No notable events documented.

## 2023-07-10 NOTE — CV Procedure (Signed)
 DCC: Anesthesia: Propofol On Rx DOAC no missed doses  DCC x 1 200 J biphasic Converted from flutter/fib rate 98 to  NSR rate 68 bpm  No immediate neurologic sequelae  Charlton Haws MD [facility]

## 2023-07-10 NOTE — Anesthesia Postprocedure Evaluation (Signed)
 Anesthesia Post Note  Patient: Stephen Roberts  Procedure(s) Performed: CARDIOVERSION     Patient location during evaluation: Cath Lab Anesthesia Type: General Level of consciousness: patient cooperative and awake and alert Pain management: pain level controlled Vital Signs Assessment: post-procedure vital signs reviewed and stable Respiratory status: spontaneous breathing Cardiovascular status: stable Anesthetic complications: no   No notable events documented.  Last Vitals:  Vitals:   07/10/23 0950 07/10/23 0955  BP: 120/74 123/89  Pulse: 75 68  Resp: (!) 23 14  Temp:    SpO2: 94% 95%    Last Pain:  Vitals:   07/10/23 0955  TempSrc:   PainSc: 0-No pain                 Lewie Loron

## 2023-07-10 NOTE — Anesthesia Procedure Notes (Signed)
 Procedure Name: MAC Date/Time: 07/10/2023 9:34 AM  Performed by: Bartholomew Crews, CRNAPre-anesthesia Checklist: Patient identified, Timeout performed, Emergency Drugs available, Suction available and Patient being monitored Patient Re-evaluated:Patient Re-evaluated prior to induction Preoxygenation: Pre-oxygenation with 100% oxygen Induction Type: IV induction Placement Confirmation: positive ETCO2 Dental Injury: Teeth and Oropharynx as per pre-operative assessment

## 2023-07-13 ENCOUNTER — Other Ambulatory Visit (HOSPITAL_BASED_OUTPATIENT_CLINIC_OR_DEPARTMENT_OTHER): Payer: Self-pay | Admitting: Cardiology

## 2023-07-26 ENCOUNTER — Other Ambulatory Visit: Payer: Self-pay | Admitting: Internal Medicine

## 2023-08-08 NOTE — Progress Notes (Unsigned)
 Electrophysiology Office Note:   Date:  08/09/2023  ID:  KHAMRON GELLERT, DOB 08/07/1965, MRN [id number]  Primary Cardiologist: Christell Constant, MD Primary Heart Failure: None Electrophysiologist: Sherree Shankman Jorja Loa, MD      History of Present Illness:   Stephen Roberts is a 58 y.o. male with h/o atrial fibrillation, diabetes, PVCs seen today for routine electrophysiology followup.   Since last being seen in our clinic the patient reports doing overall well.  He has no acute complaints currently.  Unfortunately he did go back into atrial flutter.  He had cardioversion 07/10/2023.  Since his cardioversion, he has had a quite a bit more energy with less fatigue and shortness of breath.  He is feeling quite poorly when he was in atrial flutter.  he denies chest pain, palpitations, dyspnea, PND, orthopnea, nausea, vomiting, dizziness, syncope, edema, weight gain, or early satiety.   Review of systems complete and found to be negative unless listed in HPI.   EP Information / Studies Reviewed:    EKG is ordered today. Personal review as below.  EKG Interpretation Date/Time:  Wednesday August 09 2023 08:15:49 EDT Ventricular Rate:  67 PR Interval:  216 QRS Duration:  116 QT Interval:  394 QTC Calculation: 416 R Axis:   -56  Text Interpretation: Sinus rhythm with 1st degree A-V block with frequent Premature ventricular complexes Left axis deviation Septal infarct , age undetermined When compared with ECG of 10-Jul-2023 09:46, Premature ventricular complexes are now Present Confirmed by Alaija Ruble ([id number]) on 08/09/2023 8:20:11 AM     Risk Assessment/Calculations:    CHA2DS2-VASc Score = 4   This indicates a 4.8% annual risk of stroke. The patient's score is based upon: CHF History: 1 HTN History: 1 Diabetes History: 1 Stroke History: 0 Vascular Disease History: 1 Age Score: 0 Gender Score: 0       STOP-Bang Score:          Physical Exam:   VS:  BP (!) 148/96 (BP  Location: Left Arm, Patient Position: Sitting, Cuff Size: Large)   Pulse 67   Ht 5\' 9"  (1.753 m)   Wt 215 lb (97.5 kg)   SpO2 98%   BMI 31.75 kg/m    Wt Readings from Last 3 Encounters:  08/09/23 215 lb (97.5 kg)  07/10/23 217 lb (98.4 kg)  06/20/23 217 lb (98.4 kg)     GEN: Well nourished, well developed in no acute distress NECK: No JVD; No carotid bruits CARDIAC: Regular rate and rhythm, no murmurs, rubs, gallops RESPIRATORY:  Clear to auscultation without rales, wheezing or rhonchi  ABDOMEN: Soft, non-tender, non-distended EXTREMITIES:  No edema; No deformity   ASSESSMENT AND PLAN:    1.  Persistent atrial fibrillation/atrial flutter: Currently on flecainide 100 mg twice daily.  Had a recent cardioversion.  Discussed continued flecainide, alternative medication management, ablation.  His flecainide is not controlling completely his arrhythmia.  Due to that, we Aicia Babinski plan for ablation.  Risks and benefits have been discussed.  He understands the risks and is agreed to the procedure.  Risk, benefits, and alternatives to EP study and radiofrequency/pulse field ablation for afib were also discussed in detail today. These risks include but are not limited to stroke, bleeding, vascular damage, tamponade, perforation, damage to the esophagus, lungs, and other structures, pulmonary vein stenosis, worsening renal function, and death. The patient understands these risk and wishes to proceed.  We Regino Fournet therefore proceed with catheter ablation at the next available time.  Carto,  ICE, anesthesia are requested for the procedure.  Gearl Baratta also obtain CT PV protocol prior to the procedure to exclude LAA thrombus and further evaluate atrial anatomy.  2.  Secondary hypercoagulable state: Currently on Eliquis 5 mg twice daily for atrial fibrillation  3.  PVCs: 11% burden on cardiac monitor.  Patient is unaware of PVCs.  Winston Sobczyk continue monitoring.  4.  Chronic systolic heart failure: Repeat echo with  normalized ejection fraction.  No obvious volume overload.  5.  Obstructive sleep apnea: CPAP compliance encouraged  6.  High risk medication monitoring: Currently on flecainide.  QRS remains narrow.  Follow up with Afib Clinic as usual post procedure  Signed, Gaila Engebretsen Jorja Loa, MD

## 2023-08-09 ENCOUNTER — Encounter: Payer: Self-pay | Admitting: Cardiology

## 2023-08-09 ENCOUNTER — Ambulatory Visit: Payer: PPO | Attending: Cardiology | Admitting: Cardiology

## 2023-08-09 ENCOUNTER — Other Ambulatory Visit: Payer: Self-pay | Admitting: *Deleted

## 2023-08-09 VITALS — BP 148/96 | HR 67 | Ht 69.0 in | Wt 215.0 lb

## 2023-08-09 DIAGNOSIS — Z01812 Encounter for preprocedural laboratory examination: Secondary | ICD-10-CM | POA: Diagnosis not present

## 2023-08-09 DIAGNOSIS — I4819 Other persistent atrial fibrillation: Secondary | ICD-10-CM

## 2023-08-09 DIAGNOSIS — I493 Ventricular premature depolarization: Secondary | ICD-10-CM

## 2023-08-09 DIAGNOSIS — D6869 Other thrombophilia: Secondary | ICD-10-CM

## 2023-08-09 DIAGNOSIS — Z79899 Other long term (current) drug therapy: Secondary | ICD-10-CM

## 2023-08-09 NOTE — Patient Instructions (Signed)
 Medication Instructions:  Your physician recommends that you continue on your current medications as directed. Please refer to the Current Medication list given to you today.  *If you need a refill on your cardiac medications before your next appointment, please call your pharmacy*   Lab Work: Pre procedure labs -- we will call you to schedule:  BMP & CBC  If you have a lab test that is abnormal and we need to change your treatment, we will call you to review the results -- otherwise no news is good news.    Testing/Procedures: Your physician has requested that you have cardiac CT 1 month PRIOR to your ablation. Cardiac computed tomography (CT) is a painless test that uses an x-ray machine to take clear, detailed pictures of your heart. We will contact you if the result is abnormal. We will call you to schedule.  Your physician has recommended that you have an ablation. Catheter ablation is a medical procedure used to treat some cardiac arrhythmias (irregular heartbeats). During catheter ablation, a long, thin, flexible tube is put into a blood vessel in your groin (upper thigh), or neck. This tube is called an ablation catheter. It is then guided to your heart through the blood vessel. Radio frequency waves destroy small areas of heart tissue where abnormal heartbeats may cause an arrhythmia to start.   Your ablation is scheduled for 10/12/2023. Please arrive at [facility] at 10:00 am.  We will call you for further instructions.   Follow-Up: At [facility], you and your health needs are our priority.  As part of our continuing mission to provide you with exceptional heart care, we have created designated Provider Care Teams.  These Care Teams include your primary Cardiologist (physician) and Advanced Practice Providers (APPs -  Physician Assistants and Nurse Practitioners) who all work together to provide you with the care you need, when you need it.  Your next appointment:   1  month(s) after your ablation  The format for your next appointment:   In Person  Provider:   AFib clinic   Thank you for choosing [facility]!!   Dory Horn, RN [phone]    Other Instructions   Cardiac Ablation Cardiac ablation is a procedure to destroy (ablate) some heart tissue that is sending bad signals. These bad signals cause problems in heart rhythm. The heart has many areas that make these signals. If there are problems in these areas, they can make the heart beat in a way that is not normal. Destroying some tissues can help make the heart rhythm normal. Tell your doctor about: Any allergies you have. All medicines you are taking. These include vitamins, herbs, eye drops, creams, and over-the-counter medicines. Any problems you or family members have had with medicines that make you fall asleep (anesthetics). Any blood disorders you have. Any surgeries you have had. Any medical conditions you have, such as kidney failure. Whether you are pregnant or may be pregnant. What are the risks? This is a safe procedure. But problems may occur, including: Infection. Bruising and bleeding. Bleeding into the chest. Stroke or blood clots. Damage to nearby areas of your body. Allergies to medicines or dyes. The need for a pacemaker if the normal system is damaged. Failure of the procedure to treat the problem. What happens before the procedure? Medicines Ask your doctor about: Changing or stopping your normal medicines. This is important. Taking aspirin and ibuprofen. Do not take these medicines unless your doctor tells you to  take them. Taking other medicines, vitamins, herbs, and supplements. General instructions Follow instructions from your doctor about what you cannot eat or drink. Plan to have someone take you home from the hospital or clinic. If you will be going home right after the procedure, plan to have someone with you for 24 hours. Ask your doctor  what steps will be taken to prevent infection. What happens during the procedure?  An IV tube will be put into one of your veins. You will be given a medicine to help you relax. The skin on your neck or groin will be numbed. A cut (incision) will be made in your neck or groin. A needle will be put through your cut and into a large vein. A tube (catheter) will be put into the needle. The tube will be moved to your heart. Dye may be put through the tube. This helps your doctor see your heart. Small devices (electrodes) on the tube will send out signals. A type of energy will be used to destroy some heart tissue. The tube will be taken out. Pressure will be held on your cut. This helps stop bleeding. A bandage will be put over your cut. The exact procedure may vary among doctors and hospitals. What happens after the procedure? You will be watched until you leave the hospital or clinic. This includes checking your heart rate, breathing rate, oxygen, and blood pressure. Your cut will be watched for bleeding. You will need to lie still for a few hours. Do not drive for 24 hours or as long as your doctor tells you. Summary Cardiac ablation is a procedure to destroy some heart tissue. This is done to treat heart rhythm problems. Tell your doctor about any medical conditions you may have. Tell him or her about all medicines you are taking to treat them. This is a safe procedure. But problems may occur. These include infection, bruising, bleeding, and damage to nearby areas of your body. Follow what your doctor tells you about food and drink. You may also be told to change or stop some of your medicines. After the procedure, do not drive for 24 hours or as long as your doctor tells you. This information is not intended to replace advice given to you by your health care provider. Make sure you discuss any questions you have with your health care provider. Document Revised: 07/23/2021 Document  Reviewed: 04/04/2019 Elsevier Patient Education  2023 [employer]   Cardiac Ablation, Care After  This sheet gives you information about how to care for yourself after your procedure. Your health care provider may also give you more specific instructions. If you have problems or questions, contact your health care provider. What can I expect after the procedure? After the procedure, it is common to have: Bruising around your puncture site. Tenderness around your puncture site. Skipped heartbeats. If you had an atrial fibrillation ablation, you may have atrial fibrillation during the first several months after your procedure.  Tiredness (fatigue).  Follow these instructions at home: Puncture site care  Follow instructions from your health care provider about how to take care of your puncture site. Make sure you: If present, leave stitches (sutures), skin glue, or adhesive strips in place. These skin closures may need to stay in place for up to 2 weeks. If adhesive strip edges start to loosen and curl up, you may trim the loose edges. Do not remove adhesive strips completely unless your health care provider tells you to do that.  If a large square bandage is present, this may be removed 24 hours after surgery.  Check your puncture site every day for signs of infection. Check for: Redness, swelling, or pain. Fluid or blood. If your puncture site starts to bleed, lie down on your back, apply firm pressure to the area, and contact your health care provider. Warmth. Pus or a bad smell. A pea or small marble sized lump at the site is normal and can take up to three months to resolve.  Driving Do not drive for at least 4 days after your procedure or however long your health care provider recommends. (Do not resume driving if you have previously been instructed not to drive for other health reasons.) Do not drive or use heavy machinery while taking prescription pain medicine. Activity Avoid  activities that take a lot of effort for at least 7 days after your procedure. Do not lift anything that is heavier than 5 lb (4.5 kg) for one week.  No sexual activity for 1 week.  Return to your normal activities as told by your health care provider. Ask your health care provider what activities are safe for you. General instructions Take over-the-counter and prescription medicines only as told by your health care provider. Do not use any products that contain nicotine or tobacco, such as cigarettes and e-cigarettes. If you need help quitting, ask your health care provider. You may shower after 24 hours, but Do not take baths, swim, or use a hot tub for 1 week.  Do not drink alcohol for 24 hours after your procedure. Keep all follow-up visits as told by your health care provider. This is important. Contact a health care provider if: You have redness, mild swelling, or pain around your puncture site. You have fluid or blood coming from your puncture site that stops after applying firm pressure to the area. Your puncture site feels warm to the touch. You have pus or a bad smell coming from your puncture site. You have a fever. You have chest pain or discomfort that spreads to your neck, jaw, or arm. You have chest pain that is worse with lying on your back or taking a deep breath. You are sweating a lot. You feel nauseous. You have a fast or irregular heartbeat. You have shortness of breath. You are dizzy or light-headed and feel the need to lie down. You have pain or numbness in the arm or leg closest to your puncture site. Get help right away if: Your puncture site suddenly swells. Your puncture site is bleeding and the bleeding does not stop after applying firm pressure to the area. These symptoms may represent a serious problem that is an emergency. Do not wait to see if the symptoms will go away. Get medical help right away. Call your local emergency services (911 in the [place] Do not  drive yourself to the hospital. Summary After the procedure, it is normal to have bruising and tenderness at the puncture site in your groin, neck, or forearm. Check your puncture site every day for signs of infection. Get help right away if your puncture site is bleeding and the bleeding does not stop after applying firm pressure to the area. This is a medical emergency. This information is not intended to replace advice given to you by your health care provider. Make sure you discuss any questions you have with your health care provider.

## 2023-08-10 ENCOUNTER — Ambulatory Visit: Payer: PPO | Admitting: Internal Medicine

## 2023-08-17 ENCOUNTER — Other Ambulatory Visit: Payer: Self-pay | Admitting: Cardiology

## 2023-08-22 ENCOUNTER — Other Ambulatory Visit: Payer: Self-pay | Admitting: Internal Medicine

## 2023-08-22 DIAGNOSIS — E1169 Type 2 diabetes mellitus with other specified complication: Secondary | ICD-10-CM

## 2023-09-15 ENCOUNTER — Telehealth (HOSPITAL_COMMUNITY): Payer: Self-pay

## 2023-09-15 NOTE — Telephone Encounter (Signed)
 Per Dr. Lawana Pray, patient should hold flecainide  and metoprolol  for 3 days prior to ablation.   Attempted to reach patient to discuss upcoming procedure, no answer. Left VM for patient to return call.

## 2023-09-20 DIAGNOSIS — Z01812 Encounter for preprocedural laboratory examination: Secondary | ICD-10-CM | POA: Diagnosis not present

## 2023-09-20 DIAGNOSIS — I4819 Other persistent atrial fibrillation: Secondary | ICD-10-CM | POA: Diagnosis not present

## 2023-09-21 LAB — BASIC METABOLIC PANEL WITH GFR
BUN/Creatinine Ratio: 29 — ABNORMAL HIGH (ref 9–20)
BUN: 22 mg/dL (ref 6–24)
CO2: 23 mmol/L (ref 20–29)
Calcium: 10.4 mg/dL — ABNORMAL HIGH (ref 8.7–10.2)
Chloride: 97 mmol/L (ref 96–106)
Creatinine, Ser: 0.77 mg/dL (ref 0.76–1.27)
Glucose: 108 mg/dL — ABNORMAL HIGH (ref 70–99)
Potassium: 5.1 mmol/L (ref 3.5–5.2)
Sodium: 139 mmol/L (ref 134–144)
eGFR: 103 mL/min/{1.73_m2} (ref >59)

## 2023-09-21 LAB — CBC
Hematocrit: 45.6 % (ref 37.5–51.0)
Hemoglobin: 15.5 g/dL (ref 13.0–17.7)
MCH: 33.4 pg — ABNORMAL HIGH (ref 26.6–33.0)
MCHC: 34 g/dL (ref 31.5–35.7)
MCV: 98 fL — ABNORMAL HIGH (ref 79–97)
Platelets: 157 10*3/uL (ref 150–450)
RBC: 4.64 x10E6/uL (ref 4.14–5.80)
RDW: 12.3 % (ref 11.6–15.4)
WBC: 6.7 10*3/uL (ref 3.4–10.8)

## 2023-09-22 ENCOUNTER — Ambulatory Visit (HOSPITAL_COMMUNITY)
Admission: RE | Admit: 2023-09-22 | Discharge: 2023-09-22 | Disposition: A | Discharge status: Home / Self Care | Source: Ambulatory Visit | Attending: Cardiology

## 2023-09-22 DIAGNOSIS — I517 Cardiomegaly: Secondary | ICD-10-CM | POA: Diagnosis not present

## 2023-09-22 DIAGNOSIS — I4819 Other persistent atrial fibrillation: Secondary | ICD-10-CM | POA: Insufficient documentation

## 2023-09-22 MED ORDER — METOPROLOL TARTRATE 5 MG/5ML IV SOLN
10.0000 mg | Freq: Once | INTRAVENOUS | Status: DC | PRN
Start: 2023-09-22 — End: 2023-09-23

## 2023-09-22 MED ORDER — DILTIAZEM HCL 25 MG/5ML IV SOLN: 10.0000 mg | INTRAVENOUS | Status: DC | PRN

## 2023-09-22 MED ORDER — IOHEXOL 350 MG/ML SOLN
100.0000 mL | Freq: Once | INTRAVENOUS | Status: AC | PRN
  Administered 2023-09-22: 100 mL via INTRAVENOUS

## 2023-09-26 ENCOUNTER — Telehealth (INDEPENDENT_AMBULATORY_CARE_PROVIDER_SITE_OTHER): Admitting: Internal Medicine

## 2023-09-26 ENCOUNTER — Encounter: Payer: Self-pay | Admitting: Internal Medicine

## 2023-09-26 DIAGNOSIS — J309 Allergic rhinitis, unspecified: Secondary | ICD-10-CM

## 2023-09-26 DIAGNOSIS — I480 Paroxysmal atrial fibrillation: Secondary | ICD-10-CM | POA: Diagnosis not present

## 2023-09-26 DIAGNOSIS — E1169 Type 2 diabetes mellitus with other specified complication: Secondary | ICD-10-CM

## 2023-09-26 DIAGNOSIS — J449 Chronic obstructive pulmonary disease, unspecified: Secondary | ICD-10-CM | POA: Diagnosis not present

## 2023-09-26 DIAGNOSIS — Z7984 Long term (current) use of oral hypoglycemic drugs: Secondary | ICD-10-CM | POA: Diagnosis not present

## 2023-09-26 NOTE — Patient Instructions (Signed)
 Please take Zyrtec for allergies.  Please use sinus inhaler for nasal congestion.  Please continue to take medications as prescribed.  Please continue to follow low carb diet and perform moderate exercise/walking at least 150 mins/week.

## 2023-09-26 NOTE — Assessment & Plan Note (Addendum)
 His nasal congestion and dull headache is likely due to allergic rhinitis/sinusitis Flonase  for nasal congestion Advised to take Zyrtec for allergies Advised to use sinus inhaler for nasal congestion Wear mask when outdoors

## 2023-09-26 NOTE — Assessment & Plan Note (Signed)
 Lab Results  Component Value Date   HGBA1C 7.1 (H) 05/11/2023   Uncontrolled, but improving Associated with HTN, HLD and obesity On Metformin  1000 mg twice daily On Ozempic - now at 1 mg qw dose through patient assistance Advised to follow diabetic diet F/u CMP and lipid panel On statin and ARB now Diabetic eye exam: Advised to follow up with Ophthalmology for diabetic eye exam

## 2023-09-26 NOTE — Progress Notes (Signed)
 Virtual Visit via Video Note   Because of DEAGON UYEHARA co-morbid illnesses, he is at least at moderate risk for complications without adequate follow up.  This format is felt to be most appropriate for this patient at this time.  All issues noted in this document were discussed and addressed.  A limited physical exam was performed with this format.      Evaluation Performed:  Follow-up visit  Date:  09/26/2023   ID:  Stephen Roberts, DOB [birth year], MRN [id number]  Patient Location: Home Provider Location: Office/Clinic  Participants: Patient Location of Patient: Home Location of Provider: Telehealth Consent was obtain for visit to be over via telehealth. I verified that I am speaking with the correct person using two identifiers.  PCP:  Meldon Sport, MD   Chief Complaint: F/u of chronic medical conditions  History of Present Illness:    Stephen Roberts is a 58 y.o. male with PMH of atrial fibrillation,left parotid mass, IDA, type II DM, HLD, chronic neck pain and tobacco abuse who has a video visit for f/u of his chronic medical conditions.  Type II DM: He takes metformin  1000 mg twice daily. He has been taking Ozempic, now at 1 mg QW dose. His HbA1c was 7.1 in 11/24.  Does not check blood glucose regularly. Denies any polyuria or polyphagia currently. He admits that he needs to improve his diet.   Atrial fibrillation: He is in sinus rhythm now with flecainide .  He takes metoprolol  for rate control and Eliquis  for [facility].  Followed by cardiology.  He reports intermittent palpitations, for which he takes additional dose of metoprolol , which improves his symptoms. He had cardioversion in 02/25 and is planned to get cardiac ablation in 2 weeks.  COPD: He has mild, intermittent dyspnea, for which he uses albuterol  inhaler.  Denies any wheezing currently. Denies any fever or chills currently.  He still smokes about 0.3 pack/day and is trying to quit.  Allergic rhinitis: He  reports nasal congestion, sinus pressure related headache and postnasal drip for the last few months.  Denies any fever or chills.  He has tried using Flonase  and nasal saline with mild relief.   MDD: He reports fatigue, increased appetite and sleep disturbance -mostly insomnia now.  He has difficulty maintaining sleep. He reports that he is tired of his medicines, but states that he overall feels better compared to prior.  He denies any SI or HI currently.  The patient does not have symptoms concerning for COVID-19 infection (fever, chills, cough, or new shortness of breath).   Past Medical, Surgical, Social History, Allergies, and Medications have been Reviewed.  Past Medical History:  Diagnosis Date   A-fib (HCC)    Arthritis    Diabetes (HCC)    GERD (gastroesophageal reflux disease)    Heart murmur    IDA (iron  deficiency anemia)    OSA (obstructive sleep apnea)    moderate obstructive sleep apnea with an AHI of 18.1/h   Pneumonia 06/27/2006   Past Surgical History:  Procedure Laterality Date   ANTERIOR FUSION CERVICAL SPINE     CARDIOVERSION N/A 10/05/2021   Procedure: CARDIOVERSION;  Surgeon: Luana Rumple, MD;  Location: [facility] END[facility];  Service: Cardiovascular;  Laterality: N/A;   CARDIOVERSION N/A 11/18/2021   Procedure: CARDIOVERSION;  Surgeon: Jerryl Morin, DO;  Location: [facility] ENDOSCOPY;  Service: Cardiovascular;  Laterality: N/A;   CARDIOVERSION N/A 07/10/2023   Procedure: CARDIOVERSION;  Surgeon: Loyde Rule, MD;  Location:  [facility] INVASIVE CV LAB;  Service: Cardiovascular;  Laterality: N/A;   COLONOSCOPY WITH PROPOFOL  N/A 03/26/2020   Procedure: COLONOSCOPY WITH PROPOFOL ;  Surgeon: Suzette Espy, MD;  Location: [facility] ENDO SU[facility];  Service: Endoscopy;  Laterality: N/A;   disc in neck Left 05/2017   ESOPHAGEAL DILATION N/A 03/25/2020   Procedure: ESOPHAGEAL DILATION;  Surgeon: Ruby Corporal, MD;  Location: [facility];  Service: Endoscopy;  Laterality: N/A;    ESOPHAGOGASTRODUODENOSCOPY (EGD) WITH PROPOFOL  N/A 03/25/2020   Procedure: ESOPHAGOGASTRODUODENOSCOPY (EGD) WITH PROPOFOL ;  Surgeon: Ruby Corporal, MD;  Location: [facility];  Service: Endoscopy;  Laterality: N/A;   GIVENS CAPSULE STUDY N/A 04/13/2020   Procedure: GIVENS CAPSULE STUDY;  Surgeon: Suzette Espy, MD;  Location: [facility];  Service: Endoscopy;  Laterality: N/A;  7:30am   INSERTION OF MESH N/A 01/23/2013   Procedure: INSERTION OF MESH;  Surgeon: Levert Ready, MD;  Location: [facility] ORS;  Service: General;  Laterality: N/A;   LEFT HEART CATH AND CORONARY ANGIOGRAPHY N/A 08/27/2021   Procedure: LEFT HEART CATH AND CORONARY ANGIOGRAPHY;  Surgeon: Swaz[place], Peter M, MD;  Location: [facility] INVASIVE CV LAB;  Service: Cardiovascular;  Laterality: N/A;   POLYPECTOMY  03/26/2020   Procedure: POLYPECTOMY;  Surgeon: Suzette Espy, MD;  Location: [facility];  Service: Endoscopy;;   TONSILLECTOMY     as child   VENTRAL HERNIA REPAIR N/A 01/23/2013   Procedure: LAPAROSCOPIC MULTIPLE VENTRAL HERNIAS;  Surgeon: Levert Ready, MD;  Location: [facility] ORS;  Service: General;  Laterality: N/A;     No outpatient medications have been marked as taking for the 09/26/23 encounter (Video Visit) with Meldon Sport, MD.     Allergies:   Gabapentin, Lyrica [pregabalin], Crestor  [rosuvastatin ], and Farxiga  [dapagliflozin ]   ROS:   Please see the history of present illness. All other systems reviewed and are negative.   Labs/Other Tests and Data Reviewed:    Recent Labs: 04/21/2023: Magnesium  1.5 05/11/2023: ALT 23 09/20/2023: BUN 22; Creatinine, Ser 0.77; Hemoglobin 15.5; Platelets 157; Potassium 5.1; Sodium 139   Recent Lipid Panel Lab Results  Component Value Date/Time   CHOL 159 05/11/2023 08:12 AM   TRIG 146 05/11/2023 08:12 AM   HDL 37 (L) 05/11/2023 08:12 AM   CHOLHDL 4.3 05/11/2023 08:12 AM   CHOLHDL 4.2 02/13/2019 12:58 PM   LDLCALC 96 05/11/2023 08:12 AM   LDLCALC 97 02/13/2019  12:58 PM    Wt Readings from Last 3 Encounters:  08/09/23 215 lb (97.5 kg)  07/10/23 217 lb (98.4 kg)  06/20/23 217 lb (98.4 kg)     Objective:    Vital Signs:  There were no vitals taken for this visit.   VITAL SIGNS:  reviewed GEN:  no acute distress EYES:  sclerae anicteric, EOMI - Extraocular Movements Intact RESPIRATORY:  normal respiratory effort, symmetric expansion NEURO:  alert and oriented x 3, no obvious focal deficit PSYCH:  normal affect  ASSESSMENT & PLAN:    Paroxysmal atrial fibrillation (HCC) Rate controlled with metoprolol  now On flecainide  100 mg daily In sinus rhythm now On Eliquis  for [facility] Follow-up with cardiology S/p cardioversion Followed by EP cardiology for cardiac ablation  Allergic rhinitis His nasal congestion and dull headache is likely due to allergic rhinitis/sinusitis Flonase  for nasal congestion Advised to take Zyrtec for allergies Advised to use sinus inhaler for nasal congestion Wear mask when outdoors  Type 2 diabetes mellitus with other specified complication [facility]) Lab Results  Component Value  Date   HGBA1C 7.1 (H) 05/11/2023   Uncontrolled, but improving Associated with HTN, HLD and obesity On Metformin  1000 mg twice daily On Ozempic - now at 1 mg qw dose through patient assistance Advised to follow diabetic diet F/u CMP and lipid panel On statin and ARB now Diabetic eye exam: Advised to follow up with Ophthalmology for diabetic eye exam   I discussed the assessment and treatment plan with the patient. The patient was provided an opportunity to ask questions, and all were answered. The patient agreed with the plan and demonstrated an understanding of the instructions.   The patient was advised to call back or seek an in-person evaluation if the symptoms worsen or if the condition fails to improve as anticipated.  The above assessment and management plan was discussed with the patient. The patient verbalized understanding of  and has agreed to the management plan.   Medication Adjustments/Labs and Tests Ordered: Current medicines are reviewed at length with the patient today.  Concerns regarding medicines are outlined above.   Tests Ordered: No orders of the defined types were placed in this encounter.   Medication Changes: No orders of the defined types were placed in this encounter.    Note: This dictation was prepared with Dragon dictation along with smaller phrase technology. Similar sounding words can be transcribed inadequately or may not be corrected upon review. Any transcriptional errors that result from this process are unintentional.      Disposition:  Follow up  Signed, Meldon Sport, MD  09/26/2023 8:41 AM     [facility]

## 2023-09-26 NOTE — Assessment & Plan Note (Addendum)
 Rate controlled with metoprolol  now On flecainide  100 mg daily In sinus rhythm now On Eliquis  for [facility] Follow-up with cardiology S/p cardioversion Followed by EP cardiology for cardiac ablation

## 2023-09-26 NOTE — Assessment & Plan Note (Signed)
 Well-controlled Uses albuterol as needed for dyspnea or wheezing, does not need it daily If frequent use needed, will add maintenance inhaler for COPD

## 2023-09-28 ENCOUNTER — Ambulatory Visit: Payer: Self-pay

## 2023-10-01 NOTE — Pre-Procedure Instructions (Signed)
 Spoke with patient regarding procedure on 5/29 with anesthesia.  He is aware to hold Ozempic for 7 days prior to procedure.

## 2023-10-05 ENCOUNTER — Telehealth (HOSPITAL_COMMUNITY): Payer: Self-pay

## 2023-10-05 ENCOUNTER — Encounter: Payer: Self-pay | Admitting: Internal Medicine

## 2023-10-05 ENCOUNTER — Other Ambulatory Visit: Payer: Self-pay

## 2023-10-05 DIAGNOSIS — E1169 Type 2 diabetes mellitus with other specified complication: Secondary | ICD-10-CM | POA: Diagnosis not present

## 2023-10-05 NOTE — Telephone Encounter (Signed)
 Spoke with patient's wife Stephen Roberts to discuss upcoming procedure/DPR on file.   CT: completed.  Labs: completed.   Any recent signs of acute illness or been started on antibiotics? No Any new medications started? Any medications to hold?  hold Semaglutide for 7 days- last dose on 5/18 and hold flecainide  and metoprolol  for 3 days- last dose 5/25.  Any missed doses of blood thinner? No  Advised patient to continue taking ANTICOAGULANT: Eliquis  (Apixaban ) twice daily without missing any doses.  Medication instructions:  On the morning of your procedure DO NOT take any medication., including Eliquis  or the procedure may be rescheduled. Nothing to eat or drink after midnight prior to your procedure.  Confirmed patient is scheduled for Atrial Fibrillation Ablation, Atrial Flutter Ablation on Thursday, May 29 with Dr. Agatha Horsfall. Instructed patient to arrive at the Main Entrance A at [facility]: [address] [place], [place] [address] and check in at Admitting at 10:00 AM.  Advised of plan to go home the same day and will only stay overnight if medically necessary. You MUST have a responsible adult to drive you home and MUST be with you the first 24 hours after you arrive home or your procedure could be cancelled.  Melissa verbalized understanding to all instructions provided and agreed to proceed with procedure.

## 2023-10-06 ENCOUNTER — Ambulatory Visit: Payer: Self-pay | Admitting: Internal Medicine

## 2023-10-06 LAB — BAYER DCA HB A1C WAIVED: HB A1C (BAYER DCA - WAIVED): 6 % — ABNORMAL HIGH (ref 4.8–5.6)

## 2023-10-07 ENCOUNTER — Other Ambulatory Visit (HOSPITAL_BASED_OUTPATIENT_CLINIC_OR_DEPARTMENT_OTHER): Payer: Self-pay | Admitting: Internal Medicine

## 2023-10-10 ENCOUNTER — Other Ambulatory Visit (HOSPITAL_BASED_OUTPATIENT_CLINIC_OR_DEPARTMENT_OTHER): Payer: Self-pay | Admitting: Internal Medicine

## 2023-10-11 NOTE — Pre-Procedure Instructions (Signed)
 Instructed patient on the following items: Arrival time 0930 Nothing to eat or drink after midnight No meds AM of procedure Responsible person to drive you home and stay with you for 24 hrs  Have you missed any doses of anti-coagulant Elqiuis- takes twice a day, know he hasn't missed any doses in last 3 weeks.  Definitely hasn't missed any doses since his CT scan.

## 2023-10-12 ENCOUNTER — Ambulatory Visit (HOSPITAL_COMMUNITY)
Admission: RE | Admit: 2023-10-12 | Discharge: 2023-10-12 | Disposition: A | Discharge status: Home / Self Care | Attending: Cardiology | Admitting: Cardiology

## 2023-10-12 ENCOUNTER — Encounter: Payer: Self-pay | Admitting: Emergency Medicine

## 2023-10-12 ENCOUNTER — Other Ambulatory Visit: Payer: Self-pay

## 2023-10-12 ENCOUNTER — Ambulatory Visit (HOSPITAL_COMMUNITY)
Admission: RE | Disposition: A | Discharge status: Home / Self Care | Payer: Self-pay | Source: Home / Self Care | Attending: Cardiology

## 2023-10-12 ENCOUNTER — Ambulatory Visit (HOSPITAL_COMMUNITY): Admitting: Registered Nurse

## 2023-10-12 ENCOUNTER — Encounter (HOSPITAL_COMMUNITY): Payer: Self-pay | Admitting: Cardiology

## 2023-10-12 DIAGNOSIS — I10 Essential (primary) hypertension: Secondary | ICD-10-CM | POA: Diagnosis not present

## 2023-10-12 DIAGNOSIS — I110 Hypertensive heart disease with heart failure: Secondary | ICD-10-CM | POA: Diagnosis not present

## 2023-10-12 DIAGNOSIS — E119 Type 2 diabetes mellitus without complications: Secondary | ICD-10-CM | POA: Diagnosis not present

## 2023-10-12 DIAGNOSIS — G4730 Sleep apnea, unspecified: Secondary | ICD-10-CM | POA: Diagnosis not present

## 2023-10-12 DIAGNOSIS — I4819 Other persistent atrial fibrillation: Secondary | ICD-10-CM

## 2023-10-12 DIAGNOSIS — J449 Chronic obstructive pulmonary disease, unspecified: Secondary | ICD-10-CM | POA: Insufficient documentation

## 2023-10-12 DIAGNOSIS — I4892 Unspecified atrial flutter: Secondary | ICD-10-CM

## 2023-10-12 DIAGNOSIS — E785 Hyperlipidemia, unspecified: Secondary | ICD-10-CM | POA: Diagnosis not present

## 2023-10-12 DIAGNOSIS — I483 Typical atrial flutter: Secondary | ICD-10-CM | POA: Diagnosis not present

## 2023-10-12 DIAGNOSIS — I509 Heart failure, unspecified: Secondary | ICD-10-CM | POA: Insufficient documentation

## 2023-10-12 DIAGNOSIS — F17210 Nicotine dependence, cigarettes, uncomplicated: Secondary | ICD-10-CM

## 2023-10-12 DIAGNOSIS — I4891 Unspecified atrial fibrillation: Secondary | ICD-10-CM

## 2023-10-12 DIAGNOSIS — K219 Gastro-esophageal reflux disease without esophagitis: Secondary | ICD-10-CM | POA: Diagnosis not present

## 2023-10-12 HISTORY — PX: A-FLUTTER ABLATION: EP1230

## 2023-10-12 HISTORY — PX: ATRIAL FIBRILLATION ABLATION: EP1191

## 2023-10-12 LAB — POCT ACTIVATED CLOTTING TIME: Activated Clotting Time: 314 s

## 2023-10-12 LAB — GLUCOSE, CAPILLARY: Glucose-Capillary: 94 mg/dL (ref 70–99)

## 2023-10-12 SURGERY — ATRIAL FIBRILLATION ABLATION
Anesthesia: General

## 2023-10-12 MED ORDER — OXYCODONE HCL 5 MG PO TABS: 5.0000 mg | ORAL_TABLET | Freq: Once | ORAL | Status: DC | PRN

## 2023-10-12 MED ORDER — ATROPINE SULFATE 1 MG/ML IV SOLN
INTRAVENOUS | Status: DC | PRN
  Administered 2023-10-12: 1 mg via INTRAVENOUS

## 2023-10-12 MED ORDER — SUGAMMADEX SODIUM 200 MG/2ML IV SOLN
INTRAVENOUS | Status: DC | PRN
  Administered 2023-10-12: 400 mg via INTRAVENOUS

## 2023-10-12 MED ORDER — HEPARIN SODIUM (PORCINE) 1000 UNIT/ML IJ SOLN
INTRAMUSCULAR | Status: DC | PRN
Start: 2023-10-12 — End: 2023-10-12
  Administered 2023-10-12: 3000 [IU] via INTRAVENOUS
  Administered 2023-10-12: 15000 [IU] via INTRAVENOUS

## 2023-10-12 MED ORDER — ONDANSETRON HCL 4 MG/2ML IJ SOLN: 4.0000 mg | Freq: Four times a day (QID) | INTRAMUSCULAR | Status: DC | PRN

## 2023-10-12 MED ORDER — ACETAMINOPHEN 10 MG/ML IV SOLN: 1000.0000 mg | Freq: Once | INTRAVENOUS | Status: DC | PRN

## 2023-10-12 MED ORDER — FENTANYL CITRATE (PF) 100 MCG/2ML IJ SOLN
25.0000 ug | INTRAMUSCULAR | Status: DC | PRN
  Administered 2023-10-12: 50 ug via INTRAVENOUS

## 2023-10-12 MED ORDER — SODIUM CHLORIDE 0.9 % IV SOLN
250.0000 mL | INTRAVENOUS | Status: DC | PRN
Start: 2023-10-12 — End: 2023-10-12

## 2023-10-12 MED ORDER — PROPOFOL 10 MG/ML IV BOLUS
INTRAVENOUS | Status: DC | PRN
  Administered 2023-10-12: 150 mg via INTRAVENOUS

## 2023-10-12 MED ORDER — ONDANSETRON HCL 4 MG/2ML IJ SOLN
INTRAMUSCULAR | Status: DC | PRN
  Administered 2023-10-12: 4 mg via INTRAVENOUS

## 2023-10-12 MED ORDER — PROTAMINE SULFATE 10 MG/ML IV SOLN
INTRAVENOUS | Status: DC | PRN
Start: 2023-10-12 — End: 2023-10-12
  Administered 2023-10-12: 40 mg via INTRAVENOUS

## 2023-10-12 MED ORDER — PHENYLEPHRINE HCL-NACL 20-0.9 MG/250ML-% IV SOLN
INTRAVENOUS | Status: DC | PRN
  Administered 2023-10-12: 40 ug/min via INTRAVENOUS

## 2023-10-12 MED ORDER — SODIUM CHLORIDE 0.9 % IV SOLN
INTRAVENOUS | Status: DC
Start: 2023-10-12 — End: 2023-10-12

## 2023-10-12 MED ORDER — LIDOCAINE 2% (20 MG/ML) 5 ML SYRINGE
INTRAMUSCULAR | Status: DC | PRN
  Administered 2023-10-12: 80 mg via INTRAVENOUS

## 2023-10-12 MED ORDER — FENTANYL CITRATE (PF) 100 MCG/2ML IJ SOLN
INTRAMUSCULAR | Status: AC
  Filled 2023-10-12: qty 2

## 2023-10-12 MED ORDER — SODIUM CHLORIDE 0.9% FLUSH: 3.0000 mL | INTRAVENOUS | Status: DC | PRN

## 2023-10-12 MED ORDER — DROPERIDOL 2.5 MG/ML IJ SOLN: 0.6250 mg | Freq: Once | INTRAMUSCULAR | Status: DC | PRN

## 2023-10-12 MED ORDER — HEPARIN (PORCINE) IN NACL 1000-0.9 UT/500ML-% IV SOLN
INTRAVENOUS | Status: DC | PRN
  Administered 2023-10-12 (×3): 500 mL

## 2023-10-12 MED ORDER — ACETAMINOPHEN 325 MG PO TABS: 650.0000 mg | ORAL_TABLET | ORAL | Status: DC | PRN

## 2023-10-12 MED ORDER — ROCURONIUM BROMIDE 10 MG/ML (PF) SYRINGE
PREFILLED_SYRINGE | INTRAVENOUS | Status: DC | PRN
  Administered 2023-10-12: 10 mg via INTRAVENOUS
  Administered 2023-10-12: 70 mg via INTRAVENOUS
  Administered 2023-10-12: 10 mg via INTRAVENOUS

## 2023-10-12 MED ORDER — OXYCODONE HCL 5 MG/5ML PO SOLN: 5.0000 mg | Freq: Once | ORAL | Status: DC | PRN

## 2023-10-12 MED ORDER — FENTANYL CITRATE (PF) 250 MCG/5ML IJ SOLN
INTRAMUSCULAR | Status: DC | PRN
  Administered 2023-10-12 (×2): 50 ug via INTRAVENOUS

## 2023-10-12 SURGICAL SUPPLY — 21 items
BAG SNAP BAND KOVER 36X36 (MISCELLANEOUS) IMPLANT
BLANKET WARM UNDERBOD FULL ACC (MISCELLANEOUS) ×2 IMPLANT
CABLE PFA RX CATH CONN (CABLE) IMPLANT
CATH EZ STEER NAV 8MM D-F CUR (ABLATOR) IMPLANT
CATH FARAWAVE ABLATION 31 (CATHETERS) IMPLANT
CATH GE 8FR SOUNDSTAR (CATHETERS) IMPLANT
CATH OCTARAY 2.0 F 3-3-3-3-3 (CATHETERS) IMPLANT
CATH WEBSTER BI DIR CS D-F CRV (CATHETERS) IMPLANT
CLOSURE MYNX CONTROL 6F/7F (Vascular Products) IMPLANT
CLOSURE PERCLOSE PROSTYLE (VASCULAR PRODUCTS) IMPLANT
COVER SWIFTLINK CONNECTOR (BAG) ×2 IMPLANT
DILATOR VESSEL 38 20CM 16FR (INTRODUCER) IMPLANT
GUIDEWIRE INQWIRE 1.5J.035X260 (WIRE) IMPLANT
KIT VERSACROSS CNCT FARADRIVE (KITS) IMPLANT
PACK EP LF (CUSTOM PROCEDURE TRAY) ×2 IMPLANT
PAD DEFIB RADIO PHYSIO CONN (PAD) ×2 IMPLANT
PATCH CARTO3 (PAD) IMPLANT
SHEATH AVANTI 11CM 9FR (SHEATH) IMPLANT
SHEATH FARADRIVE STEERABLE (SHEATH) IMPLANT
SHEATH PINNACLE 8F 10CM (SHEATH) IMPLANT
SHEATH PROBE COVER 6X72 (BAG) IMPLANT

## 2023-10-12 NOTE — H&P (Signed)
  Electrophysiology Office Note:   Date:  10/12/2023  ID:  Stephen Roberts, DOB [birth year], MRN [id number]  Primary Cardiologist: Jann Melody, MD Primary Heart Failure: None Electrophysiologist: Stephen Applin Cortland Ding, MD      History of Present Illness:   Stephen Roberts is a 58 y.o. male with h/o atrial fibrillation, diabetes, PVCs seen today for routine electrophysiology followup.   Today, denies symptoms of palpitations, chest pain, shortness of breath, orthopnea, PND, lower extremity edema, claudication, dizziness, presyncope, syncope, bleeding, or neurologic sequela. The patient is tolerating medications without difficulties. Plan ablation today.   EP Information / Studies Reviewed:    EKG is ordered today. Personal review as below.        Risk Assessment/Calculations:    CHA2DS2-VASc Score = 4   This indicates a 4.8% annual risk of stroke. The patient's score is based upon: CHF History: 1 HTN History: 1 Diabetes History: 1 Stroke History: 0 Vascular Disease History: 1 Age Score: 0 Gender Score: 0       STOP-Bang Score:          Physical Exam:   VS:  BP (!) 136/92   Pulse 70   Temp 98.2 F (36.8 C)   Resp 17   Ht 5\' 9"  (1.753 m)   Wt 97.5 kg   BMI 31.75 kg/m    Wt Readings from Last 3 Encounters:  10/12/23 97.5 kg  08/09/23 97.5 kg  07/10/23 98.4 kg    GEN: No acute distress.   Neck: No JVD Cardiac: RRR, no murmurs, rubs, or gallops.  Respiratory: normal BS bilaterally. GI: Soft, nontender, non-distended  MS: No edema; No deformity. Neuro:  Nonfocal  Skin: warm and dry, Psych: Normal affect    ASSESSMENT AND PLAN:    1.  Persistent atrial fibrillation/atrial flutter: Stephen Roberts has presented today for surgery, with the diagnosis of AF.  The various methods of treatment have been discussed with the patient and family. After consideration of risks, benefits and other options for treatment, the patient has consented to   Procedure(s): Catheter ablation as a surgical intervention .  Risks include but not limited to complete heart block, stroke, esophageal damage, nerve damage, bleeding, vascular damage, tamponade, perforation, MI, and death. The patient's history has been reviewed, patient examined, no change in status, stable for surgery.  I have reviewed the patient's chart and labs.  Questions were answered to the patient's satisfaction.    Stephen Aguinaga Lawana Pray, MD 10/12/2023 10:55 AM

## 2023-10-12 NOTE — Anesthesia Postprocedure Evaluation (Signed)
 Anesthesia Post Note  Patient: Stephen Roberts  Procedure(s) Performed: ATRIAL FIBRILLATION ABLATION A-FLUTTER ABLATION     Patient location during evaluation: PACU Anesthesia Type: General Level of consciousness: awake and alert Pain management: pain level controlled Vital Signs Assessment: post-procedure vital signs reviewed and stable Respiratory status: spontaneous breathing, nonlabored ventilation, respiratory function stable and patient connected to nasal cannula oxygen  Cardiovascular status: blood pressure returned to baseline and stable Postop Assessment: no apparent nausea or vomiting Anesthetic complications: no   There were no known notable events for this encounter.  Last Vitals:  Vitals:   10/12/23 1340 10/12/23 1400  BP: 127/87 134/89  Pulse: 77 79  Resp: 12 14  Temp:    SpO2: 96% 96%    Last Pain:  Vitals:   10/12/23 1427  TempSrc:   PainSc: 5    Pain Goal: Patients Stated Pain Goal: 4 (10/12/23 0955)                 Lethaniel Rave

## 2023-10-12 NOTE — Progress Notes (Signed)
Patient and wife was given discharge instructions both verbalized understanding.

## 2023-10-12 NOTE — Transfer of Care (Signed)
 Immediate Anesthesia Transfer of Care Note  Patient: Stephen Roberts  Procedure(s) Performed: ATRIAL FIBRILLATION ABLATION A-FLUTTER ABLATION  Patient Location: PACU  Anesthesia Type:General  Level of Consciousness: awake, alert , and oriented  Airway & Oxygen  Therapy: Patient Spontanous Breathing  Post-op Assessment: Report given to RN and Post -op Vital signs reviewed and stable  Post vital signs: Reviewed and stable  Last Vitals:  Vitals Value Taken Time  BP 141/91 1422  Temp    Pulse 93   Resp 14   SpO2 97%     Last Pain:  Vitals:   10/12/23 0955  PainSc: 0-No pain      Patients Stated Pain Goal: 4 (10/12/23 0955)  Complications: There were no known notable events for this encounter.

## 2023-10-12 NOTE — Anesthesia Procedure Notes (Addendum)
 Procedure Name: Intubation Date/Time: 10/12/2023 11:43 AM  Performed by: Jamas Maywood, CRNAPre-anesthesia Checklist: Patient identified, Emergency Drugs available, Suction available and Patient being monitored Patient Re-evaluated:Patient Re-evaluated prior to induction Oxygen  Delivery Method: Circle system utilized Preoxygenation: Pre-oxygenation with 100% oxygen  Induction Type: IV induction Ventilation: Mask ventilation without difficulty and Oral airway inserted - appropriate to patient size Laryngoscope Size: Mac and 4 Grade View: Grade III Tube type: Oral Tube size: 7.5 mm Number of attempts: 1 Airway Equipment and Method: Stylet and Oral airway Placement Confirmation: ETT inserted through vocal cords under direct vision, positive ETCO2 and breath sounds checked- equal and bilateral Secured at: 23 cm Tube secured with: Tape Dental Injury: Teeth and Oropharynx as per pre-operative assessment

## 2023-10-12 NOTE — Anesthesia Preprocedure Evaluation (Addendum)
 Anesthesia Evaluation  Patient identified by MRN, date of birth, ID band Patient awake    Reviewed: Allergy & Precautions, NPO status , Patient's Chart, lab work & pertinent test results  History of Anesthesia Complications Negative for: history of anesthetic complications  Airway Mallampati: III  TM Distance: >3 FB Neck ROM: Full    Dental  (+) Edentulous Upper, Dental Advisory Given, Missing   Pulmonary sleep apnea , pneumonia, resolved, COPD, Current Smoker and Patient abstained from smoking.   Pulmonary exam normal breath sounds clear to auscultation       Cardiovascular hypertension, Pt. on medications and Pt. on home beta blockers + dysrhythmias Atrial Fibrillation + Valvular Problems/Murmurs  Rhythm:Irregular Rate:Normal  Echo 06/2023  1. Left ventricular ejection fraction, by estimation, is 55 to 60%. The left ventricle has normal function. The left ventricle has no regional wall motion abnormalities. There is mild concentric left ventricular hypertrophy. Left ventricular diastolic parameters are indeterminate.   2. Right ventricular systolic function is normal. The right ventricular size is normal. There is normal pulmonary artery systolic pressure. The estimated right ventricular systolic pressure is 26.8 mmHg.   3. Left atrial size was moderately dilated.   4. Right atrial size was mildly dilated.   5. The mitral valve is grossly normal. Trivial mitral valve regurgitation.   6. The aortic valve is tricuspid. Aortic valve regurgitation is not visualized. No aortic stenosis is present.   7. The inferior vena cava is normal in size with greater than 50% respiratory variability, suggesting right atrial pressure of 3 mmHg.   Comparison(s): Prior images reviewed side by side. LVEF normal range at  55-60%. Normal aortic root (37 mm).     Cath 08/2021  LV end diastolic pressure is normal.   1. Mild nonobstructive CAD 2. Normal  LVEDP   Plan: medical therapy.    Neuro/Psych  PSYCHIATRIC DISORDERS  Depression     Neuromuscular disease    GI/Hepatic Neg liver ROS, hiatal hernia,GERD  Medicated,,  Endo/Other  diabetes  GLP1 stopped 2 weeks ago  Renal/GU negative Renal ROS     Musculoskeletal  (+) Arthritis ,    Abdominal  (+) + obese  Peds  Hematology  (+) Blood dyscrasia, anemia   Anesthesia Other Findings   Reproductive/Obstetrics                              Anesthesia Physical Anesthesia Plan  ASA: 3  Anesthesia Plan: General   Post-op Pain Management: Minimal or no pain anticipated   Induction: Intravenous  PONV Risk Score and Plan: 1 and Ondansetron , Dexamethasone  and Treatment may vary due to age or medical condition  Airway Management Planned: Oral ETT  Additional Equipment: None  Intra-op Plan:   Post-operative Plan: Extubation in OR  Informed Consent: I have reviewed the patients History and Physical, chart, labs and discussed the procedure including the risks, benefits and alternatives for the proposed anesthesia with the patient or authorized representative who has indicated his/her understanding and acceptance.     Dental advisory given  Plan Discussed with: CRNA  Anesthesia Plan Comments:         Anesthesia Quick Evaluation

## 2023-10-12 NOTE — Discharge Instructions (Signed)

## 2023-10-13 ENCOUNTER — Telehealth: Payer: Self-pay | Admitting: Cardiology

## 2023-10-13 LAB — GLUCOSE, CAPILLARY: Glucose-Capillary: 92 mg/dL (ref 70–99)

## 2023-10-13 MED FILL — Fentanyl Citrate Preservative Free (PF) Inj 100 MCG/2ML: INTRAMUSCULAR | Qty: 2 | Status: AC

## 2023-10-13 NOTE — Telephone Encounter (Signed)
 Patient would like to discuss post afib ablation instructions regarding changing out gauze.

## 2023-10-13 NOTE — Telephone Encounter (Signed)
 Spoke with patient to complete post procedure follow up call.  Patient reports no complications with groin sites.   Instructions reviewed with patient:  Remove large bandage at puncture site after 24 hours. It is normal to have bruising, tenderness, mild swelling, and a pea or marble sized lump/knot at the groin site which can take up to three months to resolve.  Get help right away if you notice sudden swelling at the puncture site.  Check your puncture site every day for signs of infection: fever, redness, swelling, pus drainage, warmth, foul odor or excessive pain. If this occurs, please call the office at [phone], to speak with the nurse. Get help right away if your puncture site is bleeding and the bleeding does not stop after applying firm pressure to the area.  You may continue to have skipped beats/ atrial fibrillation during the first several months after your procedure.  It is very important not to miss any doses of your blood thinner Eliquis . Patient restarted taking this medication on 10/12/23.   You will follow up with the Afib clinic on 11/09/23 and the APP on 01/16/24.   Patient verbalized understanding to all instructions provided.

## 2023-10-25 ENCOUNTER — Ambulatory Visit

## 2023-11-01 ENCOUNTER — Ambulatory Visit

## 2023-11-02 ENCOUNTER — Ambulatory Visit (INDEPENDENT_AMBULATORY_CARE_PROVIDER_SITE_OTHER)

## 2023-11-02 DIAGNOSIS — E119 Type 2 diabetes mellitus without complications: Secondary | ICD-10-CM

## 2023-11-02 DIAGNOSIS — E1169 Type 2 diabetes mellitus with other specified complication: Secondary | ICD-10-CM | POA: Diagnosis not present

## 2023-11-02 LAB — HM DIABETES EYE EXAM

## 2023-11-02 NOTE — Progress Notes (Signed)
 arrived 11/02/2023 and has given verbal consent to obtain images and complete their overdue diabetic retinal screening.  The images have been sent to an ophthalmologist or optometrist for review and interpretation.  Results will be sent back to Dr. Lydia Sams for review.  Patient has been informed they will be contacted when we receive the results via telephone or MyChart.

## 2023-11-08 ENCOUNTER — Other Ambulatory Visit: Payer: Self-pay | Admitting: Internal Medicine

## 2023-11-08 DIAGNOSIS — J449 Chronic obstructive pulmonary disease, unspecified: Secondary | ICD-10-CM

## 2023-11-08 DIAGNOSIS — I480 Paroxysmal atrial fibrillation: Secondary | ICD-10-CM

## 2023-11-08 NOTE — Telephone Encounter (Signed)
 Eliquis  5mg  refill request received. Patient is 58 years old, weight-97.5kg, Crea-0.77 on 09/20/23, Diagnosis-Afib, and last seen by Dr. Inocencio on 08/09/23. Dose is appropriate based on dosing criteria. Will send in refill to requested pharmacy.

## 2023-11-09 ENCOUNTER — Ambulatory Visit (HOSPITAL_COMMUNITY): Admitting: Physician Assistant

## 2023-11-09 ENCOUNTER — Ambulatory Visit (HOSPITAL_COMMUNITY)
Admission: RE | Admit: 2023-11-09 | Discharge: 2023-11-09 | Disposition: A | Discharge status: Home / Self Care | Source: Ambulatory Visit | Attending: Physician Assistant | Admitting: Physician Assistant

## 2023-11-09 VITALS — BP 88/70 | HR 76 | Ht 69.0 in | Wt 203.2 lb

## 2023-11-09 DIAGNOSIS — I4891 Unspecified atrial fibrillation: Secondary | ICD-10-CM | POA: Diagnosis not present

## 2023-11-09 DIAGNOSIS — D6869 Other thrombophilia: Secondary | ICD-10-CM | POA: Diagnosis not present

## 2023-11-09 DIAGNOSIS — Z5181 Encounter for therapeutic drug level monitoring: Secondary | ICD-10-CM

## 2023-11-09 DIAGNOSIS — I4819 Other persistent atrial fibrillation: Secondary | ICD-10-CM

## 2023-11-09 DIAGNOSIS — Z79899 Other long term (current) drug therapy: Secondary | ICD-10-CM | POA: Diagnosis not present

## 2023-11-09 DIAGNOSIS — I10 Essential (primary) hypertension: Secondary | ICD-10-CM | POA: Diagnosis not present

## 2023-11-09 NOTE — Progress Notes (Signed)
 Primary Care Physician: Tobie Suzzane POUR, MD Referring Physician:Dr. Bluford Sedler is a 58 y.o. male with a h/o Stephen Roberts is a 58 y.o. male with medical history significant for atrial fibrillation on Eliquis , type 2 diabetes, iron  deficiency anemia, hypertension, OSA, tobacco abuse, GERD, dyslipidemia, and obesity.    He went to urgent Care in [place] on 7/01 with urinary frequency and hesitancy as well as pain.  He was diagnosed with UTI, given Rocephin  IM x1 and prescribed a 10-day course of Bactrim  p.o. However, he never got the prescription filled.   On 07/02, he called the on-call PA/NP reporting a heart rate of 170.  He was severely symptomatic with this.  He was instructed to come to the ER.   Urine culture performed which showed greater than 100,000 colonies of E. coli, sensitive to all tested medications.  He got IV Rocephin  1 g on 07/02-07/04, a total of 4 doses.  He will be on Bactrim  for 7 days at discharge.    Urine culture performed which showed greater than 100,000 colonies of E. coli, sensitive to all tested medications.  He got IV Rocephin  1 g on 07/02-07/04, a total of 4 doses.  He will be on Bactrim  for 7 days at discharge.  On 11/18/2021, he had cardioversion with 200 J of biphasic synchronized energy.  He converted to normal sinus rhythm.  There were no complications.   Post cardioversion, he is doing well.  He is to continue the flecainide  at 100 mg twice daily and Toprol -XL at 100 mg daily.  He is to continue the Eliquis  5 mg twice daily and it was emphasized that he should not miss doses.   He is doing well post cardioversion, and no further inpatient work-up is indicated.  He is considered stable for discharge, to follow-up as an outpatient.  He is now in the afib clinic for f/u. EKG shows SR with first degree AVB and PVC's. He had PVC's in the hospital. Dr. Inocencio saw on consult while in the hospital and recommended  the flecainide .he had a  successful cardioversion in the hospital.  He feels improved but still tired. He is suppose to see Dr. Shlomo later in the summer to discuss other means to treat sleep apnea. He has a Optician, dispensing at home to track rhythm. He is compliant with eliquis  5 mg bid. He is still on antibiotics for UTI, has f/u with urology later this week.  Follow up 11/09/23. Patient is s/p afib and flutter ablation with Dr Inocencio on 10/12/23. He reports that he feels much better post ablation. He has an occasional flutter which resolves quickly. He did have some groin tenderness which is improving. No bleeding issues on anticoagulation. He states he has been working outside in the heat the past few days. No dizziness or presyncope.   Today, he  denies symptoms of chest pain, shortness of breath, orthopnea, PND, lower extremity edema, dizziness, presyncope, syncope, bleeding, or neurologic sequela. The patient is tolerating medications without difficulties and is otherwise without complaint today.    Past Medical History:  Diagnosis Date   A-fib (HCC)    Arthritis    Diabetes (HCC)    GERD (gastroesophageal reflux disease)    Heart murmur    IDA (iron  deficiency anemia)    OSA (obstructive sleep apnea)    moderate obstructive sleep apnea with an AHI of 18.1/h   Pneumonia 06/27/2006    Current Outpatient Medications  Medication Sig  Dispense Refill   acetaminophen  (TYLENOL ) 650 MG CR tablet Take 1,300 mg by mouth daily. (Patient taking differently: Take 1,300 mg by mouth as needed.)     albuterol  (VENTOLIN  HFA) 108 (90 Base) MCG/ACT inhaler INHALE 2 PUFFS BY MOUTH EVERY 6 HOURS AS NEEDED FOR WHEEZING OR SHORTNESS OF BREATH 18 g 0   apixaban  (ELIQUIS ) 5 MG TABS tablet Take 1 tablet by mouth twice daily 180 tablet 1   cetirizine (ZYRTEC) 10 MG tablet Take 10 mg by mouth at bedtime. (Patient taking differently: Take 10 mg by mouth as needed.)     esomeprazole  (NEXIUM ) 40 MG capsule Take 1 capsule (40 mg total) by mouth  daily. 90 capsule 3   flecainide  (TAMBOCOR ) 100 MG tablet TAKE 1 TABLET BY MOUTH EVERY 12 HOURS 180 tablet 2   fluticasone  (FLONASE ) 50 MCG/ACT nasal spray Place 2 sprays into both nostrils daily. (Patient taking differently: Place 2 sprays into both nostrils as needed.) 16 g 6   furosemide  (LASIX ) 20 MG tablet Take 1 tablet by mouth once daily 90 tablet 3   ibuprofen  (ADVIL ) 200 MG tablet Take 600 mg by mouth every 6 (six) hours as needed for mild pain (pain score 1-3) or moderate pain (pain score 4-6). (Patient taking differently: Take 600 mg by mouth as needed for mild pain (pain score 1-3) or moderate pain (pain score 4-6).)     losartan  (COZAAR ) 25 MG tablet Take 1 tablet (25 mg total) by mouth daily. 90 tablet 3   metFORMIN  (GLUCOPHAGE ) 1000 MG tablet TAKE 1 TABLET BY MOUTH TWICE DAILY WITH A MEAL 180 tablet 0   metoprolol  succinate (TOPROL -XL) 100 MG 24 hr tablet Take 1 tablet by mouth once daily 90 tablet 3   Multiple Vitamin (MULTIVITAMIN) tablet Take 1 tablet by mouth daily.     potassium chloride  (KLOR-CON  M) 10 MEQ tablet Take 1 tablet by mouth once daily 90 tablet 3   pravastatin  (PRAVACHOL ) 20 MG tablet TAKE 1 TABLET BY MOUTH 3 TIMES EVERY WEEK 15 tablet 10   Probiotic Product (PROBIOTIC DAILY PO) Take 4 oz by mouth daily. Protein drink     Semaglutide, 1 MG/DOSE, (OZEMPIC, 1 MG/DOSE,) 2 MG/1.5ML SOPN Inject 1 mg into the skin every 7 (seven) days. Sunday or Monday     vitamin B-12 (CYANOCOBALAMIN ) 1000 MCG tablet Take 1,000 mcg by mouth daily.     No current facility-administered medications for this encounter.   Facility-Administered Medications Ordered in Other Encounters  Medication Dose Route Frequency Provider Last Rate Last Admin   iron  sucrose (VENOFER ) 200 mg in sodium chloride  0.9 % 100 mL IVPB  200 mg Intravenous Once Pennington, Rebekah M, PA-C        ROS- All systems are reviewed and negative except as per the HPI above  Physical Exam: Vitals:   11/09/23 1410   BP: (!) 88/70  Pulse: 76  Weight: 92.2 kg  Height: 5' 9 (1.753 m)    Wt Readings from Last 3 Encounters:  11/09/23 92.2 kg  10/12/23 97.5 kg  08/09/23 97.5 kg    GEN: Well nourished, well developed in no acute distress CARDIAC: Regular rate and rhythm with occasional ectopy, no murmurs, rubs, gallops RESPIRATORY:  Clear to auscultation without rales, wheezing or rhonchi  ABDOMEN: Soft, non-tender, non-distended EXTREMITIES:  No edema; No deformity    EKG today demonstrates SR, PVC, LAFB Vent. rate 76 BPM PR interval 198 ms QRS duration 122 ms QT/QTcB 404/454 ms   Echo 06/22/23  1. Left ventricular ejection fraction, by estimation, is 55 to 60%. The  left ventricle has normal function. The left ventricle has no regional  wall motion abnormalities. There is mild concentric left ventricular  hypertrophy. Left ventricular diastolic parameters are indeterminate.   2. Right ventricular systolic function is normal. The right ventricular  size is normal. There is normal pulmonary artery systolic pressure. The  estimated right ventricular systolic pressure is 26.8 mmHg.   3. Left atrial size was moderately dilated.   4. Right atrial size was mildly dilated.   5. The mitral valve is grossly normal. Trivial mitral valve  regurgitation.   6. The aortic valve is tricuspid. Aortic valve regurgitation is not  visualized. No aortic stenosis is present.   7. The inferior vena cava is normal in size with greater than 50%  respiratory variability, suggesting right atrial pressure of 3 mmHg.   Comparison(s): Prior images reviewed side by side. LVEF normal range at  55-60%. Normal aortic root (37 mm).    CHA2DS2-VASc Score = 4  The patient's score is based upon: CHF History: 1 HTN History: 1 Diabetes History: 1 Stroke History: 0 Vascular Disease History: 1 Age Score: 0 Gender Score: 0       ASSESSMENT AND PLAN: Persistent Atrial Fibrillation/atrial flutter The patient's  CHA2DS2-VASc score is 4, indicating a 4.8% annual risk of stroke.   S/p afib and flutter ablation 10/12/23 Patient appears to be maintaining SR Continue flecainide  100 mg BID for now. Will plan to discontinue at 3 months post ablation. If AAD is needed in the future, would consider alternate medication given elevated CAC. Continue Eliquis  5 mg BID with no missed doses for 3 months post ablation. Continue Toprol  100 mg daily  Secondary Hypercoagulable State (ICD10:  D68.69) The patient is at significant risk for stroke/thromboembolism based upon his CHA2DS2-VASc Score of 4.  Continue Apixaban  (Eliquis ). No bleeding issues.   High Risk Medication Monitoring (ICD 10: Z79.899) Intervals on ECG acceptable for flecainide  monitoring.     HTN Low today, typically runs high. He admits he has been working outside in the heat and may not have been hydrating appropriately. He denies dizziness or presyncope. He will continue to monitor with his home BP machine. Will have a low threshold to discontinue losartan .  OSA  Patient has lost 40 lbs since starting Ozempic. He was undergoing workup for Inspire. He states his snoring and nocturnal waking has improved. Could consider repeat sleep study given significant weight loss.   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    Stephen Kicks PA-C [facility] [address] [place], [place] [address] [phone]

## 2023-11-10 ENCOUNTER — Telehealth: Payer: Self-pay | Admitting: Internal Medicine

## 2023-11-10 NOTE — Telephone Encounter (Addendum)
 Called patient lvm for pick up  Ozempic Location : clinical lead office fridge

## 2023-11-28 NOTE — Progress Notes (Unsigned)
 Cardiology Office Note:  .   Date:  11/29/2023  ID:  Stephen Roberts, DOB 1965-10-15, MRN [id number] PCP: Tobie Suzzane POUR, MD  [facility] Providers Cardiologist:  Stanly DELENA Leavens, MD Cardiology APP:  Sheron Lorette GRADE, PA-C  Electrophysiologist:  Will Gladis Norton, MD  Sleep Medicine:  Wilbert Bihari, MD {  History of Present Illness: .   Stephen Roberts is a 58 y.o. male  with PMHx of atrial fibrillation/flutter (s/p successful DCCV on 11/18/2021,  ablation 10/12/2023, on Eliquis  and Flecainide ), HFimpEF (2018 60-65%, 03/2020 55-60%, 08/2021 45-50%, 04/2022 55%, 06/2023 55-60%), PVC (heart monitor 03/2022: 11% PVC), type 2 diabetes, iron  deficiency anemia, hypertension, OSA, tobacco abuse, GERD, dyslipidemia, and obesity who reports to [facility] office for follow up.   Last seen by A-fib clinic 11/09/2023 for follow-up.  At that time doing well from cardiac standpoint and denied any cardiac complaints.  BP was borderline low, however patient noted working outside in a heat and not hydrated appropriately.  Denied any dizziness or presyncope.  Continued on current medication regimen.  Today, reports mild palpitations every 2 weeks when hot relieved with rest and lasting for minutes.  Overall, notes significant improvement in palpitations since ablation.  Reports he has lost 44 pounds since being on Ozempic.  Also notes, that he no longer snores. Denies any chest pain, SOB, edema, or active bleeding.  He does not monitor BP at home, however notes BP has been running on the low side since ablation. Reports compliance with medications. He does not exercise, however he is able to complete yard work and walk around grocery store without any exertional concerns.  Smokes 1/2 PPD. Denies binging ETOH/drug use. Denies any hospitalizations or visits to the emergency department.   ROS: 10 point review of system has been reviewed and considered negative except ones been listed in the HPI.    Studies Reviewed: .   Cardiac CT 13-Oct-2023 IMPRESSION: No significant extracardiac findings within the visualized chest. IMPRESSION: 1. There is normal pulmonary vein drainage into the left atrium. No pulmonary vein stenosis. 2. Normal left atrial appendage, no left atrial appendage thrombus. No intracardiac mass or thrombus. 3. The esophagus runs in the left atrial midline and is not in the proximity to any of the pulmonary veins. 4. Large esophageal hiatal hernia. 5. Ascending aorta measures 39 mm at sinus of Valsalva and mid ascending aorta, borderline dilation. 6. Coronary artery calcium  score is 577, which is 93rd percentile for age and sex matched peers.  ECHO 06/22/2023 IMPRESSIONS   1. Left ventricular ejection fraction, by estimation, is 55 to 60%. The  left ventricle has normal function. The left ventricle has no regional  wall motion abnormalities. There is mild concentric left ventricular  hypertrophy. Left ventricular diastolic  parameters are indeterminate.   2. Right ventricular systolic function is normal. The right ventricular  size is normal. There is normal pulmonary artery systolic pressure. The  estimated right ventricular systolic pressure is 26.8 mmHg.   3. Left atrial size was moderately dilated.   4. Right atrial size was mildly dilated.   5. The mitral valve is grossly normal. Trivial mitral valve  regurgitation.   6. The aortic valve is tricuspid. Aortic valve regurgitation is not  visualized. No aortic stenosis is present.   7. The inferior vena cava is normal in size with greater than 50%  respiratory variability, suggesting right atrial pressure of 3 mmHg.   Comparison(s): Prior images reviewed side by side. LVEF  normal range at  55-60%. Normal aortic root (37 mm).   Heart Monitor 03/2022 Patch Wear Time:  10 days and 5 hours    Predominant rhythm was sinus rhythm 11.3% ventricular ectopy Less than 1% supraventricular ectopy Triggered  episodes associated with sinus rhythm and sinus rhythm with PVCs   Will Camnitz, MD  Cath 08/2021    LV end diastolic pressure is normal.   Mild nonobstructive CAD Normal LVEDP   Plan: medical therapy.  Risk Assessment/Calculations:    CHA2DS2-VASc Score = 4   This indicates a 4.8% annual risk of stroke. The patient's score is based upon: CHF History: 1 HTN History: 1 Diabetes History: 1 Stroke History: 0 Vascular Disease History: 1 Age Score: 0 Gender Score: 0  Physical Exam:   VS:  BP 92/70 (BP Location: Left Arm, Patient Position: Sitting, Cuff Size: Normal)   Pulse 78   Ht 5' 9 (1.753 m)   Wt 199 lb (90.3 kg)   SpO2 96%   BMI 29.39 kg/m    Wt Readings from Last 3 Encounters:  11/29/23 199 lb (90.3 kg)  11/09/23 203 lb 3.2 oz (92.2 kg)  10/12/23 215 lb (97.5 kg)    GEN: Well nourished, well developed in no acute distress while sitting in chair.  NECK: No JVD; No carotid bruits CARDIAC: RRR, no murmurs, rubs, gallops RESPIRATORY:  Clear to auscultation without rales, wheezing or rhonchi  ABDOMEN: Soft, non-tender, non-distended EXTREMITIES:  No edema; No deformity   ASSESSMENT AND PLAN: .   Persistent atrial fibrillation (HCC) Atrial flutter, unspecified type (HCC) Hypercoagulable state due to persistent atrial fibrillation (HCC) PVC Heart monitor 03/2022: 11% PVC s/p successful DCCV on 11/18/2021,  ablation 10/12/2023 CHA2DS2-VASc Score = 4  Reports mild palpitations every 2 weeks when hot relieved with rest and lasting for minutes.  Overall, notes significant improvement in palpitations since ablation. Regular pulse, rate and rhythm on exam.  Denies any active bleeding.  Continue on Eliquis  5 mg twice daily, which is the appropriate dose given age, weight and renal function. (58 y/o, Cr 0.77, wt 199)  Continue on Toprol  100 mg daily. Continue on flecainide  100 mg twice daily.  Per EP note, plans to discontinue at 3 months post ablation, which is approximately  01/12/2024.   Essential hypertension BP this OV on the soft side today: 94/70 with repeat BP 92/70.  Denies any dizziness, lightheadedness or presyncope.  He does not monitor BP at home, however notes that BP has been on the low side since ablation.  Discussed the weight loss from Ozempic can contribute to lowering BP.  Decrease losartan  from 25 mg to 12.5 mg daily.  BP log x 2 weeks If BP remains on the soft side with decrease losartan , then discussed decreasing Toprol  to 50 mg.  Will try to avoid discontinuing losartan  with history of CAD and HF.  Heart failure with improved ejection fraction (HFimpEF) (HCC) ECHO 2018 60-65%, 03/2020 55-60%, 08/2021 45-50%, 04/2022 55%, 06/2023 55-60% Denies any SOB or edema.  Appears euvolemic on exam. Continue Toprol  as above and decrease losartan  as above.  CAD  HLD, LDL goal  <75 Cath 08/2021: mild nonobstructive CAD Cardiac CT 09/2023: Coronary artery calcium  score is 577, which is 93rd percentile Denies any exertional CP or SOB.  04/2023 LDL 96, LFT WNL Not on Crestor  due to myalgia.  Continue pravastatin  20 mg 3 times per week.   Aortic root dilation (HCC) Cardiac CT 09/2023: Ascending aorta measures 39 mm at sinus  of Valsalva and mid ascending aorta, borderline dilation.  Continue to monitor  OSA (obstructive sleep apnea) Patient has lost 44 lbs since starting Ozempic.  He is undergoing workup for inspire but never started.   Reports since losing weight he no longer snores.  He is not interested in repeating sleep study at this time.  Tobacco abuse Currently smoke 1/2 PPD  Discussed cessation but patient is not interested.      Dispo: Follow up in 6 months with Scottie. Prefers [facility] office.   Signed, Lorette CINDERELLA Kapur, PA-C

## 2023-11-29 ENCOUNTER — Ambulatory Visit: Attending: Student | Admitting: Physician Assistant

## 2023-11-29 ENCOUNTER — Encounter: Payer: Self-pay | Admitting: Physician Assistant

## 2023-11-29 VITALS — BP 92/70 | HR 78 | Ht 69.0 in | Wt 199.0 lb

## 2023-11-29 DIAGNOSIS — Z720 Tobacco use: Secondary | ICD-10-CM

## 2023-11-29 DIAGNOSIS — I10 Essential (primary) hypertension: Secondary | ICD-10-CM | POA: Diagnosis not present

## 2023-11-29 DIAGNOSIS — I4892 Unspecified atrial flutter: Secondary | ICD-10-CM

## 2023-11-29 DIAGNOSIS — I493 Ventricular premature depolarization: Secondary | ICD-10-CM | POA: Diagnosis not present

## 2023-11-29 DIAGNOSIS — D6869 Other thrombophilia: Secondary | ICD-10-CM | POA: Diagnosis not present

## 2023-11-29 DIAGNOSIS — I77810 Thoracic aortic ectasia: Secondary | ICD-10-CM | POA: Diagnosis not present

## 2023-11-29 DIAGNOSIS — G4733 Obstructive sleep apnea (adult) (pediatric): Secondary | ICD-10-CM

## 2023-11-29 DIAGNOSIS — E782 Mixed hyperlipidemia: Secondary | ICD-10-CM

## 2023-11-29 DIAGNOSIS — I2510 Atherosclerotic heart disease of native coronary artery without angina pectoris: Secondary | ICD-10-CM

## 2023-11-29 DIAGNOSIS — I5032 Chronic diastolic (congestive) heart failure: Secondary | ICD-10-CM

## 2023-11-29 DIAGNOSIS — I4819 Other persistent atrial fibrillation: Secondary | ICD-10-CM | POA: Diagnosis not present

## 2023-11-29 MED ORDER — LOSARTAN POTASSIUM 25 MG PO TABS: 12.5000 mg | ORAL_TABLET | Freq: Every day | ORAL | 3 refills | Status: AC

## 2023-11-29 NOTE — Patient Instructions (Signed)
 Medication Instructions:   Decrease Losartan  to 12.5 mg Daily   Monitor Blood Pressure and Record for 2 Weeks   *If you need a refill on your cardiac medications before your next appointment, please call your pharmacy*  Lab Work: NONE   If you have labs (blood work) drawn today and your tests are completely normal, you will receive your results only by: MyChart Message (if you have MyChart) OR A paper copy in the mail If you have any lab test that is abnormal or we need to change your treatment, we will call you to review the results.  Testing/Procedures: NONE   Follow-Up: At [facility], you and your health needs are our priority.  As part of our continuing mission to provide you with exceptional heart care, our providers are all part of one team.  This team includes your primary Cardiologist (physician) and Advanced Practice Providers or APPs (Physician Assistants and Nurse Practitioners) who all work together to provide you with the care you need, when you need it.  Your next appointment:   6 month(s)  Provider:   Lorette Kapur, PA-C     We recommend signing up for the patient portal called MyChart.  Sign up information is provided on this After Visit Summary.  MyChart is used to connect with patients for Virtual Visits (Telemedicine).  Patients are able to view lab/test results, encounter notes, upcoming appointments, etc.  Non-urgent messages can be sent to your provider as well.   To learn more about what you can do with MyChart, go to [email or web address].   Other Instructions Thank you for choosing [facility]!

## 2023-12-05 NOTE — Telephone Encounter (Signed)
 Patient picked up medicine

## 2023-12-22 ENCOUNTER — Other Ambulatory Visit: Payer: Self-pay | Admitting: Internal Medicine

## 2023-12-22 DIAGNOSIS — E1169 Type 2 diabetes mellitus with other specified complication: Secondary | ICD-10-CM

## 2023-12-25 ENCOUNTER — Other Ambulatory Visit: Payer: Self-pay | Admitting: Internal Medicine

## 2023-12-25 DIAGNOSIS — E1169 Type 2 diabetes mellitus with other specified complication: Secondary | ICD-10-CM

## 2023-12-27 ENCOUNTER — Other Ambulatory Visit: Payer: Self-pay

## 2023-12-27 ENCOUNTER — Telehealth: Payer: Self-pay

## 2023-12-27 NOTE — Progress Notes (Unsigned)
   12/27/2023  Patient ID: Stephen Roberts, male   DOB: 1965-11-06, 58 y.o.   MRN: [id number]  This patient is appearing on a report for being at risk of failing the adherence measure for identified medications this calendar year.   Medication Adherence Summary (STAR/HEDIS Monitoring): Adherence Category: cholesterol (statin) and diabetes    Drug Name: Pravastatin 20 mg Sold Date:11/25/2023 Days' Supply: 35   Drug Name: Metformin 1000 mg Last Fill or Sold Date:*** Days' Supply: ***     Notes: ? Adherence data pulled from pharmacy claims portal Dr. Annemarie for pravastatin. ? Patient education provided on importance of adherence for chronic condition management and STAR quality performance. ? Reviewed barriers to adherence: {Reviewedbarriers:32863}. ? Plan: {adherenceinterventions:31257}  Dorcas Solian, PharmD Clinical Pharmacist Cell: [phone]   ** [facility] >> then patiennt

## 2023-12-27 NOTE — Progress Notes (Unsigned)
   12/27/2023  Patient ID: Stephen Roberts, male   DOB: April 11, 1966, 58 y.o.   MRN: [id number]  This patient is appearing on a report for being at risk of failing the adherence measure for identified medications this calendar year.   Medication Adherence Summary (STAR/HEDIS Monitoring): Adherence Category: cholesterol (statin) and diabetes    Drug Name: Pravastatin  20 mg Sold Date:11/25/2023 Days' Supply: 35   Drug Name: Metformin  1000 mg Last Fill or Sold Date:*** Days' Supply: ***     Notes: ? Adherence data pulled from pharmacy claims portal Dr. Annemarie for pravastatin . ? Patient education provided on importance of adherence for chronic condition management and STAR quality performance. ? Reviewed barriers to adherence: {Reviewedbarriers:32863}. ? Plan: {adherenceinterventions:31257}  Dorcas Solian, PharmD Clinical Pharmacist Cell: [phone]   ** [facility] >> then patiennt

## 2024-01-16 ENCOUNTER — Encounter (HOSPITAL_COMMUNITY): Payer: Self-pay | Admitting: Physician Assistant

## 2024-01-16 ENCOUNTER — Telehealth: Payer: Self-pay | Admitting: Internal Medicine

## 2024-01-16 ENCOUNTER — Ambulatory Visit (HOSPITAL_COMMUNITY)
Admission: RE | Admit: 2024-01-16 | Discharge: 2024-01-16 | Disposition: A | Discharge status: Home / Self Care | Source: Ambulatory Visit | Attending: Physician Assistant | Admitting: Physician Assistant

## 2024-01-16 VITALS — BP 116/80 | HR 80 | Ht 69.0 in | Wt 200.2 lb

## 2024-01-16 DIAGNOSIS — I4819 Other persistent atrial fibrillation: Secondary | ICD-10-CM

## 2024-01-16 DIAGNOSIS — Z09 Encounter for follow-up examination after completed treatment for conditions other than malignant neoplasm: Secondary | ICD-10-CM

## 2024-01-16 DIAGNOSIS — D6869 Other thrombophilia: Secondary | ICD-10-CM | POA: Diagnosis not present

## 2024-01-16 DIAGNOSIS — I480 Paroxysmal atrial fibrillation: Secondary | ICD-10-CM | POA: Diagnosis not present

## 2024-01-16 DIAGNOSIS — Z5181 Encounter for therapeutic drug level monitoring: Secondary | ICD-10-CM

## 2024-01-16 NOTE — Telephone Encounter (Signed)
 Patient came by the office needs to know why ozempic was a different mg that was given to him. Not sure what he should do with it. Patient left our office said he had an heart care appointment and will come back by our office if not today another day.

## 2024-01-16 NOTE — Patient Instructions (Addendum)
 Stop Flecainide     Follow up with Dr Inocencio in 6 months

## 2024-01-16 NOTE — Telephone Encounter (Signed)
 Spoke to pt states he came in this morning to pick up the 1mg  ozempic from our fridge, will bring back the 0.5mg  since he doesn't have any use for it. States he will come back later this afternoon. Just an FYI. Ozempic medication is on Patels side in the fridge.

## 2024-01-16 NOTE — Progress Notes (Signed)
 Primary Care Physician: Tobie Suzzane POUR, MD Referring Physician:Dr. Santo Primary EP: Dr Inocencio Sonny JONETTA Stephen Roberts is a 58 y.o. male with a h/o SANJAY BROADFOOT is a 58 y.o. male with medical history significant for atrial fibrillation on Eliquis , type 2 diabetes, iron  deficiency anemia, hypertension, OSA, tobacco abuse, GERD, dyslipidemia, and obesity.    He went to urgent Care in [place] on 7/01 with urinary frequency and hesitancy as well as pain.  He was diagnosed with UTI, given Rocephin  IM x1 and prescribed a 10-day course of Bactrim  p.o. However, he never got the prescription filled.   On 07/02, he called the on-call PA/NP reporting a heart rate of 170.  He was severely symptomatic with this.  He was instructed to come to the ER.   Urine culture performed which showed greater than 100,000 colonies of E. coli, sensitive to all tested medications.  He got IV Rocephin  1 g on 07/02-07/04, a total of 4 doses.  He will be on Bactrim  for 7 days at discharge.    Urine culture performed which showed greater than 100,000 colonies of E. coli, sensitive to all tested medications.  He got IV Rocephin  1 g on 07/02-07/04, a total of 4 doses.  He will be on Bactrim  for 7 days at discharge.  On 11/18/2021, he had cardioversion with 200 J of biphasic synchronized energy.  He converted to normal sinus rhythm.  There were no complications.   Post cardioversion, he is doing well.  He is to continue the flecainide  at 100 mg twice daily and Toprol -XL at 100 mg daily.  He is to continue the Eliquis  5 mg twice daily and it was emphasized that he should not miss doses.   He is doing well post cardioversion, and no further inpatient work-up is indicated.  He is considered stable for discharge, to follow-up as an outpatient.  He is now in the afib clinic for f/u. EKG shows SR with first degree AVB and PVC's. He had PVC's in the hospital. Dr. Inocencio saw on consult while in the hospital and recommended   the flecainide .he had a successful cardioversion in the hospital.  He feels improved but still tired. He is suppose to see Dr. Shlomo later in the summer to discuss other means to treat sleep apnea. He has a Optician, dispensing at home to track rhythm. He is compliant with eliquis  5 mg bid. He is still on antibiotics for UTI, has f/u with urology later this week.  Follow up 11/09/23. Patient is s/p afib and flutter ablation with Dr Inocencio on 10/12/23. He reports that he feels much better post ablation. He has an occasional flutter which resolves quickly. He did have some groin tenderness which is improving. No bleeding issues on anticoagulation. He states he has been working outside in the heat the past few days. No dizziness or presyncope.   Follow up 01/16/24. Patient returns for follow up for atrial fibrillation. He remains in SR today and feels well. No interim symptoms of afib. No bleeding issues on anticoagulation.   Today, he  denies symptoms of palpitations, chest pain, shortness of breath, orthopnea, PND, lower extremity edema, dizziness, presyncope, syncope, bleeding, or neurologic sequela. The patient is tolerating medications without difficulties and is otherwise without complaint today.    Past Medical History:  Diagnosis Date   A-fib (HCC)    Arthritis    Diabetes (HCC)    GERD (gastroesophageal reflux disease)    Heart murmur  IDA (iron  deficiency anemia)    OSA (obstructive sleep apnea)    moderate obstructive sleep apnea with an AHI of 18.1/h   Pneumonia 06/27/2006    Current Outpatient Medications  Medication Sig Dispense Refill   acetaminophen  (TYLENOL ) 650 MG CR tablet Take 1,300 mg by mouth daily. (Patient taking differently: Take 1,300 mg by mouth as needed.)     albuterol  (VENTOLIN  HFA) 108 (90 Base) MCG/ACT inhaler INHALE 2 PUFFS BY MOUTH EVERY 6 HOURS AS NEEDED FOR WHEEZING OR SHORTNESS OF BREATH 18 g 0   apixaban  (ELIQUIS ) 5 MG TABS tablet Take 1 tablet by mouth twice daily  180 tablet 1   cetirizine (ZYRTEC) 10 MG tablet Take 10 mg by mouth at bedtime. (Patient taking differently: Take 10 mg by mouth as needed.)     esomeprazole  (NEXIUM ) 40 MG capsule Take 1 capsule (40 mg total) by mouth daily. 90 capsule 3   flecainide  (TAMBOCOR ) 100 MG tablet TAKE 1 TABLET BY MOUTH EVERY 12 HOURS 180 tablet 2   fluticasone  (FLONASE ) 50 MCG/ACT nasal spray Place 2 sprays into both nostrils daily. (Patient taking differently: Place 2 sprays into both nostrils as needed.) 16 g 6   furosemide  (LASIX ) 20 MG tablet Take 1 tablet by mouth once daily 90 tablet 3   ibuprofen  (ADVIL ) 200 MG tablet Take 600 mg by mouth every 6 (six) hours as needed for mild pain (pain score 1-3) or moderate pain (pain score 4-6). (Patient taking differently: Take 600 mg by mouth as needed for mild pain (pain score 1-3) or moderate pain (pain score 4-6).)     losartan  (COZAAR ) 25 MG tablet Take 0.5 tablets (12.5 mg total) by mouth daily. 45 tablet 3   metFORMIN  (GLUCOPHAGE ) 1000 MG tablet TAKE 1 TABLET BY MOUTH TWICE DAILY WITH A MEAL 180 tablet 0   metoprolol  succinate (TOPROL -XL) 100 MG 24 hr tablet Take 1 tablet by mouth once daily 90 tablet 3   Multiple Vitamin (MULTIVITAMIN) tablet Take 1 tablet by mouth daily.     potassium chloride  (KLOR-CON  M) 10 MEQ tablet Take 1 tablet by mouth once daily 90 tablet 3   pravastatin  (PRAVACHOL ) 20 MG tablet TAKE 1 TABLET BY MOUTH 3 TIMES EVERY WEEK 15 tablet 10   Probiotic Product (PROBIOTIC DAILY PO) Take 4 oz by mouth daily. Protein drink     Semaglutide, 1 MG/DOSE, (OZEMPIC, 1 MG/DOSE,) 2 MG/1.5ML SOPN Inject 1 mg into the skin every 7 (seven) days. Sunday or Monday     vitamin B-12 (CYANOCOBALAMIN ) 1000 MCG tablet Take 1,000 mcg by mouth daily.     No current facility-administered medications for this encounter.   Facility-Administered Medications Ordered in Other Encounters  Medication Dose Route Frequency Provider Last Rate Last Admin   iron  sucrose (VENOFER )  200 mg in sodium chloride  0.9 % 100 mL IVPB  200 mg Intravenous Once Pennington, Rebekah M, PA-C        ROS- All systems are reviewed and negative except as per the HPI above  Physical Exam: Vitals:   01/16/24 1416  BP: 116/80  Pulse: 80  Weight: 90.8 kg  Height: 5' 9 (1.753 m)     Wt Readings from Last 3 Encounters:  01/16/24 90.8 kg  11/29/23 90.3 kg  11/09/23 92.2 kg    GEN: Well nourished, well developed in no acute distress CARDIAC: Regular rate and rhythm with occasional ectopy, no murmurs, rubs, gallops RESPIRATORY:  Clear to auscultation without rales, wheezing or rhonchi  ABDOMEN: Soft,  non-tender, non-distended EXTREMITIES:  No edema; No deformity    EKG today demonstrates SR, PVCs Vent. rate 80 BPM PR interval 194 ms QRS duration 122 ms QT/QTcB 392/452 ms   Echo 06/22/23  1. Left ventricular ejection fraction, by estimation, is 55 to 60%. The  left ventricle has normal function. The left ventricle has no regional  wall motion abnormalities. There is mild concentric left ventricular  hypertrophy. Left ventricular diastolic parameters are indeterminate.   2. Right ventricular systolic function is normal. The right ventricular  size is normal. There is normal pulmonary artery systolic pressure. The  estimated right ventricular systolic pressure is 26.8 mmHg.   3. Left atrial size was moderately dilated.   4. Right atrial size was mildly dilated.   5. The mitral valve is grossly normal. Trivial mitral valve  regurgitation.   6. The aortic valve is tricuspid. Aortic valve regurgitation is not  visualized. No aortic stenosis is present.   7. The inferior vena cava is normal in size with greater than 50%  respiratory variability, suggesting right atrial pressure of 3 mmHg.   Comparison(s): Prior images reviewed side by side. LVEF normal range at  55-60%. Normal aortic root (37 mm).    CHA2DS2-VASc Score = 4  The patient's score is based upon: CHF History:  1 HTN History: 1 Diabetes History: 1 Stroke History: 0 Vascular Disease History: 1 Age Score: 0 Gender Score: 0       ASSESSMENT AND PLAN: Persistent Atrial Fibrillation/atrial flutter (ICD10:  I48.19) The patient's CHA2DS2-VASc score is 4, indicating a 4.8% annual risk of stroke.   S/p afib and flutter ablation 10/12/23 Patient appears to be maintaining SR Will discontinue flecainide . Continue Eliquis  5 mg BID Continue Toprol  100 mg daily  Secondary Hypercoagulable State (ICD10:  D68.69) The patient is at significant risk for stroke/thromboembolism based upon his CHA2DS2-VASc Score of 4.  Continue Apixaban  (Eliquis ). No bleeding issues.   HTN Stable on current regimen  OSA  Previously undergoing workup for Inspire, has had significant weight loss with Ozempic.  HFrecEF EF 55-60% GDMT per primary cardiology team Fluid status appears stable today  CAD CAC score 577 No anginal symptoms Followed by Dr Santo   Follow up with Dr Inocencio in 6 months.    Daril Kicks PA-C [facility] [address] [place], [place] [address] [phone]

## 2024-01-29 ENCOUNTER — Ambulatory Visit: Admitting: Internal Medicine

## 2024-01-29 ENCOUNTER — Encounter: Payer: Self-pay | Admitting: Internal Medicine

## 2024-01-29 VITALS — BP 128/82 | HR 79 | Ht 69.0 in | Wt 195.4 lb

## 2024-01-29 DIAGNOSIS — I480 Paroxysmal atrial fibrillation: Secondary | ICD-10-CM

## 2024-01-29 DIAGNOSIS — E1169 Type 2 diabetes mellitus with other specified complication: Secondary | ICD-10-CM

## 2024-01-29 DIAGNOSIS — J449 Chronic obstructive pulmonary disease, unspecified: Secondary | ICD-10-CM

## 2024-01-29 DIAGNOSIS — Z720 Tobacco use: Secondary | ICD-10-CM

## 2024-01-29 DIAGNOSIS — Z125 Encounter for screening for malignant neoplasm of prostate: Secondary | ICD-10-CM

## 2024-01-29 DIAGNOSIS — Z23 Encounter for immunization: Secondary | ICD-10-CM

## 2024-01-29 DIAGNOSIS — J309 Allergic rhinitis, unspecified: Secondary | ICD-10-CM

## 2024-01-29 DIAGNOSIS — I10 Essential (primary) hypertension: Secondary | ICD-10-CM | POA: Diagnosis not present

## 2024-01-29 DIAGNOSIS — Z7984 Long term (current) use of oral hypoglycemic drugs: Secondary | ICD-10-CM

## 2024-01-29 DIAGNOSIS — E559 Vitamin D deficiency, unspecified: Secondary | ICD-10-CM | POA: Diagnosis not present

## 2024-01-29 DIAGNOSIS — E782 Mixed hyperlipidemia: Secondary | ICD-10-CM | POA: Diagnosis not present

## 2024-01-29 MED ORDER — ALBUTEROL SULFATE HFA 108 (90 BASE) MCG/ACT IN AERS: 2.0000 | INHALATION_SPRAY | Freq: Four times a day (QID) | RESPIRATORY_TRACT | 2 refills | Status: AC | PRN

## 2024-01-29 MED ORDER — FLUTICASONE PROPIONATE 50 MCG/ACT NA SUSP: 2.0000 | Freq: Every day | NASAL | 6 refills | Status: AC

## 2024-01-29 NOTE — Assessment & Plan Note (Signed)
Smokes about 0.5 pack/day  Asked about quitting: confirms that he/she currently smokes cigarettes Advise to quit smoking: Educated about QUITTING to reduce the risk of cancer, cardio and cerebrovascular disease. Assess willingness: Unwilling to quit at this time, but is working on cutting back. Assist with counseling and pharmacotherapy: Counseled for 5 minutes and literature provided. Arrange for follow up: follow up in 3 months and continue to offer help. 

## 2024-01-29 NOTE — Patient Instructions (Addendum)
Please continue to take medications as prescribed.  Please continue to follow low carb diet and perform moderate exercise/walking as tolerated.  Please get fasting blood tests done before the next visit.

## 2024-01-29 NOTE — Assessment & Plan Note (Signed)
 On Pravastatin QOD now Did not tolerate Crestor

## 2024-01-29 NOTE — Progress Notes (Signed)
 Established Patient Office Visit  Subjective:  Patient ID: Stephen Roberts, male    DOB: [birth year]  Age: 58 y.o. MRN: [id number]  CC:  Chief Complaint  Patient presents with   Diabetes    4 month f/u.   seasonal allergies    Having seasonal allergies.     HPI Stephen Roberts is a 58 y.o. male with past medical history of atrial fibrillation,left parotid mass, IDA, type II DM, HLD, chronic neck pain and tobacco abuse who presents for f/u of his chronic medical conditions.  Type II DM: He takes metformin  1000 mg twice daily. He has been taking Ozempic, now at 1 mg QW dose. He has lost about 45 lbs since starting Ozempic. His HbA1c was 6.0 in 05/25.  Does not check blood glucose regularly. Denies any polyuria or polyphagia currently. He admits that he needs to improve his diet.  Atrial fibrillation: He is in sinus rhythm now, s/p cardiac ablation in 05/25.  He takes metoprolol  for rate control and Eliquis  for [facility].  Followed by cardiology.  He reports intermittent palpitations, for which he takes additional dose of metoprolol , which improves his symptoms.   COPD: He has mild, intermittent dyspnea, for which he uses albuterol  inhaler.  Denies any wheezing currently. Denies any fever or chills currently.  He still smokes about 0.5 pack/day and is trying to quit.   Allergic rhinitis: He reports nasal congestion, sinus pressure related headache and postnasal drip for the last few months.  Denies any fever or chills.  He has tried using Flonase  and nasal saline with mild relief.      Past Medical History:  Diagnosis Date   A-fib (HCC)    Arthritis    Diabetes (HCC)    GERD (gastroesophageal reflux disease)    Heart murmur    IDA (iron  deficiency anemia)    OSA (obstructive sleep apnea)    moderate obstructive sleep apnea with an AHI of 18.1/h   Pneumonia 06/27/2006    Past Surgical History:  Procedure Laterality Date   A-FLUTTER ABLATION N/A 10/12/2023   Procedure: A-FLUTTER  ABLATION;  Surgeon: Inocencio Soyla Lunger, MD;  Location: [facility] IN[facility]AS[facility] CV LAB;  Service: Cardiovascular;  Laterality: N/A;   ANTERIOR FUSION CERVICAL SPINE     ATRIAL FIBRILLATION ABLATION N/A 10/12/2023   Procedure: ATRIAL FIBRILLATION ABLATION;  Surgeon: Inocencio Soyla Lunger, MD;  Location: [facility];  Service: Cardiovascular;  Laterality: N/A;   CARDIOVERSION N/A 10/05/2021   Procedure: CARDIOVERSION;  Surgeon: Francyne Headland, MD;  Location: [facility] ENDOS[facility];  Service: Cardiovascular;  Laterality: N/A;   CARDIOVERSION N/A 11/18/2021   Procedure: CARDIOVERSION;  Surgeon: Sheena Pugh, DO;  Location: [facility] ENDOSCOPY;  Service: Cardiovascular;  Laterality: N/A;   CARDIOVERSION N/A 07/10/2023   Procedure: CARDIOVERSION;  Surgeon: Delford Maude BROCKS, MD;  Location: [facility];  Service: Cardiovascular;  Laterality: N/A;   COLONOSCOPY WITH PROPOFOL  N/A 03/26/2020   Procedure: COLONOSCOPY WITH PROPOFOL ;  Surgeon: Shaaron Lamar HERO, MD;  Location: [facility];  Service: Endoscopy;  Laterality: N/A;   disc in neck Left 05/2017   ESOPHAGEAL DILATION N/A 03/25/2020   Procedure: ESOPHAGEAL DILATION;  Surgeon: Golda Claudis PENNER, MD;  Location: [facility];  Service: Endoscopy;  Laterality: N/A;   ESOPHAGOGASTRODUODENOSCOPY (EGD) WITH PROPOFOL  N/A 03/25/2020   Procedure: ESOPHAGOGASTRODUODENOSCOPY (EGD) WITH PROPOFOL ;  Surgeon: Golda Claudis PENNER, MD;  Location: [facility];  Service: Endoscopy;  Laterality: N/A;   GIVENS CAPSULE STUDY N/A 04/13/2020  Procedure: GIVENS CAPSULE STUDY;  Surgeon: Shaaron Lamar HERO, MD;  Location: [facility];  Service: Endoscopy;  Laterality: N/A;  7:30am   INSERTION OF MESH N/A 01/23/2013   Procedure: INSERTION OF MESH;  Surgeon: Elon HERO Pacini, MD;  Location: [facility] ORS;  Service: General;  Laterality: N/A;   LEFT HEART CATH AND CORONARY ANGIOGRAPHY N/A 08/27/2021   Procedure: LEFT HEART CATH AND CORONARY ANGIOGRAPHY;  Surgeon: [place], Peter M, MD;  [facility] CV  LAB;  Service: Cardiovascular;  Laterality: N/A;   POLYPECTOMY  03/26/2020   Procedure: POLYPECTOMY;  Surgeon: Shaaron Lamar HERO, MD;  Location: [facility];  Service: Endoscopy;;   TONSILLECTOMY     as child   VENTRAL HERNIA REPAIR N/A 01/23/2013   Procedure: LAPAROSCOPIC MULTIPLE VENTRAL HERNIAS;  Surgeon: Elon HERO Pacini, MD;  Location: [facility] ORS;  Service: General;  Laterality: N/A;    Family History  Problem Relation Age of Onset   Depression Mother    Hearing loss Mother    Hyperlipidemia Mother    Hypertension Mother    Stroke Mother    Early death Father    Diabetes Brother    Pancreatic cancer Paternal Uncle    Colon cancer Neg Hx     Social History   Socioeconomic History   Marital status: Married    Spouse name: Not on file   Number of children: Not on file   Years of education: Not on file   Highest education level: Not on file  Occupational History   Not on file  Tobacco Use   Smoking status: Every Day    Current packs/day: 0.75    Average packs/day: 0.8 packs/day for 46.2 years (34.7 ttl pk-yrs)    Types: Cigarettes    Start date: 29   Smokeless tobacco: Never   Tobacco comments:    1/2ppd  Vaping Use   Vaping status: Never Used  Substance and Sexual Activity   Alcohol use: Yes    Alcohol/week: 4.0 standard drinks of alcohol    Types: 4 Cans of beer per week    Comment: moderately   Drug use: Not Currently    Types: Cocaine    Comment: states he has tried it all. Last used cocaine a few years ago.SABRA   Sexual activity: Yes  Other Topics Concern   Not on file  Social History Narrative   Not on file   Social Drivers of Health   Financial Resource Strain: Low Risk  (02/13/2023)   Overall Financial Resource Strain (CARDIA)    Difficulty of Paying Living Expenses: Not hard at all  Food Insecurity: Low Risk  (02/21/2023)   Received from [facility]   Hunger Vital Sign    Within the past 12 months, you worried that your food would run out before  you got money to buy more: Never true    Within the past 12 months, the food you bought just didn't last and you didn't have money to get more. : Never true  Transportation Needs: No Transportation Needs (02/21/2023)   Received from [facility]    In the past 12 months, has lack of reliable transportation kept you from medical appointments, meetings, work or from getting things needed for daily living? : No  Physical Activity: Insufficiently Active (02/13/2023)   Exercise Vital Sign    Days of Exercise per Week: 5 days    Minutes of Exercise per Session: 20 min  Stress: No Stress Concern  Present (02/13/2023)   [employer] of Occupational Health - Occupational Stress Questionnaire    Feeling of Stress : Only a little  Social Connections: Socially Isolated (02/13/2023)   Social Connection and Isolation Panel    Frequency of Communication with Friends and Family: Never    Frequency of Social Gatherings with Friends and Family: Once a week    Attends Religious Services: Never    Database administrator or Organizations: No    Attends Banker Meetings: Never    Marital Status: Married  Catering manager Violence: Not At Risk (02/13/2023)   Humiliation, Afraid, Rape, and Kick questionnaire    Fear of Current or Ex-Partner: No    Emotionally Abused: No    Physically Abused: No    Sexually Abused: No    Outpatient Medications Prior to Visit  Medication Sig Dispense Refill   acetaminophen  (TYLENOL ) 650 MG CR tablet Take 1,300 mg by mouth daily. (Patient taking differently: Take 1,300 mg by mouth as needed.)     apixaban  (ELIQUIS ) 5 MG TABS tablet Take 1 tablet by mouth twice daily 180 tablet 1   cetirizine (ZYRTEC) 10 MG tablet Take 10 mg by mouth at bedtime. (Patient taking differently: Take 10 mg by mouth as needed.)     esomeprazole  (NEXIUM ) 40 MG capsule Take 1 capsule (40 mg total) by mouth daily. 90 capsule 3   furosemide  (LASIX ) 20 MG tablet Take 1  tablet by mouth once daily 90 tablet 3   ibuprofen  (ADVIL ) 200 MG tablet Take 600 mg by mouth every 6 (six) hours as needed for mild pain (pain score 1-3) or moderate pain (pain score 4-6). (Patient taking differently: Take 600 mg by mouth as needed for mild pain (pain score 1-3) or moderate pain (pain score 4-6).)     losartan  (COZAAR ) 25 MG tablet Take 0.5 tablets (12.5 mg total) by mouth daily. 45 tablet 3   metFORMIN  (GLUCOPHAGE ) 1000 MG tablet TAKE 1 TABLET BY MOUTH TWICE DAILY WITH A MEAL 180 tablet 0   metoprolol  succinate (TOPROL -XL) 100 MG 24 hr tablet Take 1 tablet by mouth once daily 90 tablet 3   Multiple Vitamin (MULTIVITAMIN) tablet Take 1 tablet by mouth daily.     potassium chloride  (KLOR-CON  M) 10 MEQ tablet Take 1 tablet by mouth once daily 90 tablet 3   pravastatin  (PRAVACHOL ) 20 MG tablet TAKE 1 TABLET BY MOUTH 3 TIMES EVERY WEEK 15 tablet 10   Probiotic Product (PROBIOTIC DAILY PO) Take 4 oz by mouth daily. Protein drink     Semaglutide, 1 MG/DOSE, (OZEMPIC, 1 MG/DOSE,) 2 MG/1.5ML SOPN Inject 1 mg into the skin every 7 (seven) days. Sunday or Monday     vitamin B-12 (CYANOCOBALAMIN ) 1000 MCG tablet Take 1,000 mcg by mouth daily.     albuterol  (VENTOLIN  HFA) 108 (90 Base) MCG/ACT inhaler INHALE 2 PUFFS BY MOUTH EVERY 6 HOURS AS NEEDED FOR WHEEZING OR SHORTNESS OF BREATH 18 g 0   fluticasone  (FLONASE ) 50 MCG/ACT nasal spray Place 2 sprays into both nostrils daily. (Patient taking differently: Place 2 sprays into both nostrils as needed.) 16 g 6   Facility-Administered Medications Prior to Visit  Medication Dose Route Frequency Provider Last Rate Last Admin   iron  sucrose (VENOFER ) 200 mg in sodium chloride  0.9 % 100 mL IVPB  200 mg Intravenous Once Pennington, Rebekah M, PA-C        Allergies  Allergen Reactions   Gabapentin Shortness Of Breath   Lyrica [Pregabalin]  Shortness Of Breath   Crestor  [Rosuvastatin ] Other (See Comments)    myalgia   Farxiga  [Dapagliflozin ]      Developed UTI    ROS Review of Systems  Constitutional:  Negative for chills and fever.  HENT:  Positive for congestion. Negative for sore throat.   Eyes:  Negative for pain and discharge.  Respiratory:  Positive for shortness of breath. Negative for cough.   Cardiovascular:  Positive for palpitations (Intermittent). Negative for chest pain.  Gastrointestinal:  Negative for diarrhea, nausea and vomiting.  Endocrine: Negative for polydipsia and polyuria.  Genitourinary:  Negative for dysuria and hematuria.  Musculoskeletal:  Positive for back pain and neck pain. Negative for neck stiffness.  Skin:  Negative for rash.  Neurological:  Negative for dizziness, weakness and numbness.  Psychiatric/Behavioral:  Positive for sleep disturbance. Negative for agitation and behavioral problems.       Objective:    Physical Exam Vitals reviewed.  Constitutional:      General: He is not in acute distress.    Appearance: He is not diaphoretic.  HENT:     Head: Normocephalic and atraumatic.     Nose: Congestion present.     Mouth/Throat:     Mouth: Mucous membranes are moist.  Eyes:     General: No scleral icterus.    Extraocular Movements: Extraocular movements intact.  Cardiovascular:     Rate and Rhythm: Normal rate and regular rhythm.     Heart sounds: Normal heart sounds. No murmur heard. Pulmonary:     Breath sounds: Normal breath sounds. No wheezing or rales.  Musculoskeletal:     Cervical back: Neck supple. No tenderness.     Right lower leg: No edema.     Left lower leg: No edema.  Skin:    General: Skin is warm.     Findings: No rash.  Neurological:     General: No focal deficit present.     Mental Status: He is alert and oriented to person, place, and time.     Sensory: No sensory deficit.     Motor: No weakness.  Psychiatric:        Mood and Affect: Mood normal.        Behavior: Behavior normal.     BP 128/82 (BP Location: Left Arm)   Pulse 79   Ht 5' 9 (1.753  m)   Wt 195 lb 6.4 oz (88.6 kg)   SpO2 100%   BMI 28.86 kg/m  Wt Readings from Last 3 Encounters:  01/29/24 195 lb 6.4 oz (88.6 kg)  01/16/24 200 lb 3.2 oz (90.8 kg)  11/29/23 199 lb (90.3 kg)    Lab Results  Component Value Date   TSH 1.910 03/08/2022   Lab Results  Component Value Date   WBC 6.7 09/20/2023   HGB 15.5 09/20/2023   HCT 45.6 09/20/2023   MCV 98 (H) 09/20/2023   PLT 157 09/20/2023   Lab Results  Component Value Date   NA 139 09/20/2023   K 5.1 09/20/2023   CO2 23 09/20/2023   GLUCOSE 108 (H) 09/20/2023   BUN 22 09/20/2023   CREATININE 0.77 09/20/2023   BILITOT <0.2 05/11/2023   ALKPHOS 114 05/11/2023   AST 23 05/11/2023   ALT 23 05/11/2023   PROT 7.3 05/11/2023   ALBUMIN 4.4 05/11/2023   CALCIUM  10.4 (H) 09/20/2023   ANIONGAP 12 04/21/2023   EGFR 103 09/20/2023   Lab Results  Component Value Date   CHOL 159 05/11/2023  Lab Results  Component Value Date   HDL 37 (L) 05/11/2023   Lab Results  Component Value Date   LDLCALC 96 05/11/2023   Lab Results  Component Value Date   TRIG 146 05/11/2023   Lab Results  Component Value Date   CHOLHDL 4.3 05/11/2023   Lab Results  Component Value Date   HGBA1C 6.0 (H) 10/05/2023      Assessment & Plan:   Problem List Items Addressed This Visit       Cardiovascular and Mediastinum   Essential hypertension   BP Readings from Last 1 Encounters:  01/29/24 128/82   Well-controlled with Losartan  12.5 mg QD and Metoprolol  100 mg QD now Counseled for compliance with the medications Advised DASH diet and moderate exercise/walking, at least 150 mins/week      Relevant Orders   CBC with Differential/Platelet   CMP14+EGFR   TSH   Paroxysmal atrial fibrillation (HCC)   Rate controlled with metoprolol  now In sinus rhythm now On Eliquis  for [facility] S/p cardioversion (02/25) and cardiac ablation (05/25) Followed by EP cardiology      Relevant Orders   CBC with Differential/Platelet    CMP14+EGFR   TSH     Respiratory   Allergic rhinitis   His nasal congestion and dull headache is likely due to allergic rhinitis/sinusitis Flonase  for nasal congestion Advised to take Zyrtec for allergies Advised to use sinus inhaler for nasal congestion Wear mask when outdoors      Relevant Medications   fluticasone  (FLONASE ) 50 MCG/ACT nasal spray   Chronic obstructive pulmonary disease (HCC)   Well-controlled Uses albuterol  as needed for dyspnea or wheezing, does not need it daily If frequent use needed, will add maintenance inhaler for COPD      Relevant Medications   albuterol  (VENTOLIN  HFA) 108 (90 Base) MCG/ACT inhaler   fluticasone  (FLONASE ) 50 MCG/ACT nasal spray     Endocrine   Type 2 diabetes mellitus with other specified complication (HCC) - Primary   Lab Results  Component Value Date   HGBA1C 6.0 (H) 10/05/2023   Well-controlled Associated with HTN, HLD and obesity On Metformin  1000 mg twice daily On Ozempic - now at 1 mg qw dose through patient assistance Advised to follow diabetic diet F/u CMP and lipid panel On statin and ARB now Diabetic eye exam: Advised to follow up with Ophthalmology for diabetic eye exam      Relevant Orders   Microalbumin / creatinine urine ratio   CMP14+EGFR   Hemoglobin A1c   Bayer DCA Hb A1c Waived     Other   Prostate cancer screening   Ordered PSA after discussing its limitations for prostate cancer screening, including false positive results leading to additional investigations.      Relevant Orders   PSA   Hyperlipidemia   On Pravastatin  QOD now Did not tolerate Crestor       Relevant Orders   Lipid Profile   Other Visit Diagnoses       Vitamin D  deficiency       Relevant Orders   Vitamin D  (25 hydroxy)     Encounter for immunization       Relevant Orders   Flu vaccine trivalent PF, 6mos and older(Flulaval,Afluria,Fluarix,Fluzone) (Completed)       Meds ordered this encounter  Medications    albuterol  (VENTOLIN  HFA) 108 (90 Base) MCG/ACT inhaler    Sig: Inhale 2 puffs into the lungs every 6 (six) hours as needed for wheezing or shortness of breath.  Dispense:  18 g    Refill:  2   fluticasone  (FLONASE ) 50 MCG/ACT nasal spray    Sig: Place 2 sprays into both nostrils daily.    Dispense:  16 g    Refill:  6    Follow-up: Return in about 4 months (around 05/30/2024) for Annual physical.    Suzzane MARLA Blanch, MD

## 2024-01-29 NOTE — Assessment & Plan Note (Addendum)
 Rate controlled with metoprolol  now In sinus rhythm now On Eliquis  for [facility] S/p cardioversion (02/25) and cardiac ablation (05/25) Followed by EP cardiology

## 2024-01-29 NOTE — Assessment & Plan Note (Signed)
 His nasal congestion and dull headache is likely due to allergic rhinitis/sinusitis Flonase  for nasal congestion Advised to take Zyrtec for allergies Advised to use sinus inhaler for nasal congestion Wear mask when outdoors

## 2024-01-29 NOTE — Assessment & Plan Note (Signed)
 BP Readings from Last 1 Encounters:  01/29/24 128/82   Well-controlled with Losartan  12.5 mg QD and Metoprolol  100 mg QD now Counseled for compliance with the medications Advised DASH diet and moderate exercise/walking, at least 150 mins/week

## 2024-01-29 NOTE — Assessment & Plan Note (Signed)
 Lab Results  Component Value Date   HGBA1C 6.0 (H) 10/05/2023   Well-controlled Associated with HTN, HLD and obesity On Metformin  1000 mg twice daily On Ozempic - now at 1 mg qw dose through patient assistance Advised to follow diabetic diet F/u CMP and lipid panel On statin and ARB now Diabetic eye exam: Advised to follow up with Ophthalmology for diabetic eye exam

## 2024-01-29 NOTE — Assessment & Plan Note (Signed)
 Well-controlled Uses albuterol as needed for dyspnea or wheezing, does not need it daily If frequent use needed, will add maintenance inhaler for COPD

## 2024-01-29 NOTE — Assessment & Plan Note (Addendum)
 Ordered PSA after discussing its limitations for prostate cancer screening, including false positive results leading to additional investigations.

## 2024-01-31 ENCOUNTER — Ambulatory Visit: Payer: Self-pay | Admitting: Internal Medicine

## 2024-01-31 LAB — BAYER DCA HB A1C WAIVED: HB A1C (BAYER DCA - WAIVED): 5.5 % (ref 4.8–5.6)

## 2024-02-14 ENCOUNTER — Other Ambulatory Visit (HOSPITAL_BASED_OUTPATIENT_CLINIC_OR_DEPARTMENT_OTHER): Payer: Self-pay

## 2024-02-14 ENCOUNTER — Ambulatory Visit: Payer: PPO

## 2024-02-14 VITALS — Ht 69.0 in | Wt 193.0 lb

## 2024-02-14 DIAGNOSIS — Z0000 Encounter for general adult medical examination without abnormal findings: Secondary | ICD-10-CM | POA: Diagnosis not present

## 2024-02-14 NOTE — Patient Instructions (Signed)
 [name],  Thank you for taking the time for your Medicare Wellness Visit. I appreciate your continued commitment to your health goals. Please review the care plan we discussed, and feel free to reach out if I can assist you further.  Medicare recommends these wellness visits once per year to help you and your care team stay ahead of potential health issues. These visits are designed to focus on prevention, allowing your provider to concentrate on managing your acute and chronic conditions during your regular appointments.  Please note that Annual Wellness Visits do not include a physical exam. Some assessments may be limited, especially if the visit was conducted virtually. If needed, we may recommend a separate in-person follow-up with your provider.  Wishing you excellent health and many blessings in the year to come!  -Nhung Danko, CMA  Ongoing Care Seeing your primary care provider every 3 to 6 months helps us  monitor your health and provide consistent, personalized care.   Recommended Screenings:  Health Maintenance  Topic Date Due   Pneumococcal Vaccine for age over 80 (2 of 2 - PCV) 07/30/2015   COVID-19 Vaccine (3 - Moderna risk series) 12/01/2019   Yearly kidney health urinalysis for diabetes  09/02/2022   Medicare Annual Wellness Visit  02/13/2024   Complete foot exam   04/11/2024   Screening for Lung Cancer  05/29/2024   Hemoglobin A1C  07/28/2024   Yearly kidney function blood test for diabetes  09/19/2024   Eye exam for diabetics  11/01/2024   Colon Cancer Screening  03/26/2025   DTaP/Tdap/Td vaccine (2 - Td or Tdap) 01/19/2033   Flu Shot  Completed   Hepatitis C Screening  Completed   HIV Screening  Completed   Zoster (Shingles) Vaccine  Completed   Hepatitis B Vaccine  Aged Out   HPV Vaccine  Aged Out   Meningitis B Vaccine  Aged Out       02/14/2024    4:42 PM  Advanced Directives  Does Patient Have a Medical Advance Directive? No  Would patient like information  on creating a medical advance directive? No - Patient declined   Advance Care Planning is important because it: Ensures you receive medical care that aligns with your values, goals, and preferences. Provides guidance to your family and loved ones, reducing the emotional burden of decision-making during critical moments.  Vision: Annual vision screenings are recommended for early detection of glaucoma, cataracts, and diabetic retinopathy. These exams can also reveal signs of chronic conditions such as diabetes and high blood pressure.  Dental: Annual dental screenings help detect early signs of oral cancer, gum disease, and other conditions linked to overall health, including heart disease and diabetes.  Please see the attached documents for additional preventive care recommendations.

## 2024-02-14 NOTE — Progress Notes (Signed)
 Subjective:   Stephen Roberts is a 58 y.o. who presents for a Medicare Wellness preventive visit.  As a reminder, Annual Wellness Visits don't include a physical exam, and some assessments may be limited, especially if this visit is performed virtually. We may recommend an in-person follow-up visit with your provider if needed.  Visit Complete: Virtual I connected with  Stephen Roberts on 02/14/24 by a video and audio enabled telemedicine application and verified that I am speaking with the correct person using two identifiers.  Patient Location: Home  Provider Location: Home Office  I discussed the limitations of evaluation and management by telemedicine. The patient expressed understanding and agreed to proceed.  Vital Signs: Because this visit was a virtual/telehealth visit, some criteria may be missing or patient reported. Any vitals not documented were not able to be obtained and vitals that have been documented are patient reported.  Persons Participating in Visit: Patient.  AWV Questionnaire: No: Patient Medicare AWV questionnaire was not completed prior to this visit.  Cardiac Risk Factors include: advanced age (>76men, >38 women);diabetes mellitus;dyslipidemia;hypertension;male gender;smoking/ tobacco exposure;Other (see comment), Risk factor comments: AFib, OSA     Objective:    Today's Vitals   02/14/24 1611 02/14/24 1641  Weight: 193 lb (87.5 kg)   Height: 5' 9 (1.753 m)   PainSc:  0-No pain   Body mass index is 28.5 kg/m.     02/14/2024    4:42 PM 10/12/2023    9:55 AM 07/10/2023    9:12 AM 04/21/2023   11:23 AM 02/13/2023    8:12 AM 11/03/2022    8:28 AM 05/05/2022   11:32 AM  Advanced Directives  Does Patient Have a Medical Advance Directive? No Yes Yes No Yes No Yes  Type of Special educational needs teacher of [place];Living will Healthcare Power of [employer] of [place];Living will  Healthcare Power of [place];Living will  Does  patient want to make changes to medical advance directive?       No - Patient declined  Copy of Healthcare Power of Attorney in Chart?     No - copy requested  No - copy requested  Would patient like information on creating a medical advance directive? No - Patient declined   No - Patient declined  No - Patient declined     Current Medications (verified) Outpatient Encounter Medications as of 02/14/2024  Medication Sig   acetaminophen  (TYLENOL ) 650 MG CR tablet Take 1,300 mg by mouth daily. (Patient taking differently: Take 1,300 mg by mouth as needed.)   albuterol  (VENTOLIN  HFA) 108 (90 Base) MCG/ACT inhaler Inhale 2 puffs into the lungs every 6 (six) hours as needed for wheezing or shortness of breath.   apixaban  (ELIQUIS ) 5 MG TABS tablet Take 1 tablet by mouth twice daily   cetirizine (ZYRTEC) 10 MG tablet Take 10 mg by mouth at bedtime.   esomeprazole  (NEXIUM ) 40 MG capsule Take 1 capsule (40 mg total) by mouth daily.   fluticasone  (FLONASE ) 50 MCG/ACT nasal spray Place 2 sprays into both nostrils daily.   furosemide  (LASIX ) 20 MG tablet Take 1 tablet by mouth once daily   ibuprofen  (ADVIL ) 200 MG tablet Take 600 mg by mouth every 6 (six) hours as needed for mild pain (pain score 1-3) or moderate pain (pain score 4-6). (Patient taking differently: Take 600 mg by mouth as needed for mild pain (pain score 1-3) or moderate pain (pain score 4-6).)   losartan  (COZAAR ) 25 MG tablet  Take 0.5 tablets (12.5 mg total) by mouth daily.   metFORMIN  (GLUCOPHAGE ) 1000 MG tablet Take 500 mg by mouth 2 (two) times daily with a meal.   metoprolol  succinate (TOPROL -XL) 100 MG 24 hr tablet Take 1 tablet by mouth once daily   Multiple Vitamin (MULTIVITAMIN) tablet Take 1 tablet by mouth daily.   potassium chloride  (KLOR-CON  M) 10 MEQ tablet Take 1 tablet by mouth once daily   pravastatin  (PRAVACHOL ) 20 MG tablet TAKE 1 TABLET BY MOUTH 3 TIMES EVERY WEEK   Probiotic Product (PROBIOTIC DAILY PO) Take 4 oz by  mouth daily. Protein drink   Semaglutide, 1 MG/DOSE, (OZEMPIC, 1 MG/DOSE,) 2 MG/1.5ML SOPN Inject 1 mg into the skin every 7 (seven) days. Sunday or Monday   vitamin B-12 (CYANOCOBALAMIN ) 1000 MCG tablet Take 1,000 mcg by mouth daily.   [DISCONTINUED] metFORMIN  (GLUCOPHAGE ) 1000 MG tablet TAKE 1 TABLET BY MOUTH TWICE DAILY WITH A MEAL   Facility-Administered Encounter Medications as of 02/14/2024  Medication   iron  sucrose (VENOFER ) 200 mg in sodium chloride  0.9 % 100 mL IVPB    Allergies (verified) Gabapentin, Lyrica [pregabalin], Crestor  [rosuvastatin ], and Farxiga  [dapagliflozin ]   History: Past Medical History:  Diagnosis Date   A-fib (HCC)    Arthritis    Diabetes (HCC)    GERD (gastroesophageal reflux disease)    Heart murmur    IDA (iron  deficiency anemia)    OSA (obstructive sleep apnea)    moderate obstructive sleep apnea with an AHI of 18.1/h   Pneumonia 06/27/2006   Past Surgical History:  Procedure Laterality Date   A-FLUTTER ABLATION N/A 10/12/2023   Procedure: A-FLUTTER ABLATION;  Surgeon: Inocencio Soyla Lunger, MD;  Location: [facility];  Service: Cardiovascular;  Laterality: N/A;   ANTERIOR FUSION CERVICAL SPINE     ATRIAL FIBRILLATION ABLATION N/A 10/12/2023   Procedure: ATRIAL FIBRILLATION ABLATION;  Surgeon: Inocencio Soyla Lunger, MD;  Location: [facility];  Service: Cardiovascular;  Laterality: N/A;   CARDIOVERSION N/A 10/05/2021   Procedure: CARDIOVERSION;  Surgeon: Francyne Headland, MD;  Location: [facility];  Service: Cardiovascular;  Laterality: N/A;   CARDIOVERSION N/A 11/18/2021   Procedure: CARDIOVERSION;  Surgeon: Sheena Pugh, DO;  Location: [facility];  Service: Cardiovascular;  Laterality: N/A;   CARDIOVERSION N/A 07/10/2023   Procedure: CARDIOVERSION;  Surgeon: Delford Maude BROCKS, MD;  [facility]: [facility];  Service: Cardiovascular;  Laterality: N/A;   COLONOSCOPY WITH PROPOFOL  N/A 03/26/2020   Procedure: COLONOSCOPY WITH  PROPOFOL ;  Surgeon: Shaaron Lamar HERO, MD;  Location: [facility];  Service: Endoscopy;  Laterality: N/A;   disc in neck Left 05/2017   ESOPHAGEAL DILATION N/A 03/25/2020   Procedure: ESOPHAGEAL DILATION;  Surgeon: Golda Claudis PENNER, MD;  [facility]: [facility];  Service: Endoscopy;  Laterality: N/A;   ESOPHAGOGASTRODUODENOSCOPY (EGD) WITH PROPOFOL  N/A 03/25/2020   Procedure: ESOPHAGOGASTRODUODENOSCOPY (EGD) WITH PROPOFOL ;  Surgeon: Golda Claudis PENNER, MD;  [facility]: [facility];  Service: Endoscopy;  Laterality: N/A;   GIVENS CAPSULE STUDY N/A 04/13/2020   Procedure: GIVENS CAPSULE STUDY;  Surgeon: Shaaron Lamar HERO, MD;  [facility]: [facility];  Service: Endoscopy;  Laterality: N/A;  7:30am   INSERTION OF MESH N/A 01/23/2013   Procedure: INSERTION OF MESH;  Surgeon: Elon HERO Pacini, MD;  Location: [facility];  Service: General;  Laterality: N/A;   LEFT HEART CATH AND CORONARY ANGIOGRAPHY N/A 08/27/2021   Procedure: LEFT HEART CATH AND CORONARY ANGIOGRAPHY;  Surgeon: [place], Peter M, MD;  [facility];  Service: Cardiovascular;  Laterality: N/A;   POLYPECTOMY  03/26/2020   Procedure: POLYPECTOMY;  Surgeon: Shaaron Lamar HERO, MD;  [facility];  Service: Endoscopy;;   TONSILLECTOMY     as child   VENTRAL HERNIA REPAIR N/A 01/23/2013   Procedure: LAPAROSCOPIC MULTIPLE VENTRAL HERNIAS;  Surgeon: Elon HERO Pacini, MD;  Location: [facility];  Service: General;  Laterality: N/A;   Family History  Problem Relation Age of Onset   Depression Mother    Hearing loss Mother    Hyperlipidemia Mother    Hypertension Mother    Stroke Mother    Early death Father    Diabetes Brother    Pancreatic cancer Paternal Uncle    Colon cancer Neg Hx    Social History   Socioeconomic History   Marital status: Married    Spouse name: Not on file   Number of children: Not on file   Years of education: Not on file   Highest education level: Not on file  Occupational History   Not on file   Tobacco Use   Smoking status: Every Day    Current packs/day: 0.75    Average packs/day: 0.8 packs/day for 46.2 years (34.7 ttl pk-yrs)    Types: Cigarettes    Start date: 43   Smokeless tobacco: Never   Tobacco comments:    1/2ppd  Vaping Use   Vaping status: Never Used  Substance and Sexual Activity   Alcohol use: Yes    Alcohol/week: 4.0 standard drinks of alcohol    Types: 4 Cans of beer per week    Comment: moderately   Drug use: Not Currently    Types: Cocaine    Comment: states he has tried it all. Last used cocaine a few years ago.SABRA   Sexual activity: Yes  Other Topics Concern   Not on file  Social History Narrative   Not on file   Social Drivers of Health   Financial Resource Strain: Low Risk  (02/14/2024)   Overall Financial Resource Strain (CARDIA)    Difficulty of Paying Living Expenses: Not hard at all  Food Insecurity: No Food Insecurity (02/14/2024)   Hunger Vital Sign    Worried About Running Out of Food in the Last Year: Never true    Ran Out of Food in the Last Year: Never true  Transportation Needs: No Transportation Needs (02/14/2024)   PRAPARE - Administrator, Civil Service (Medical): No    Lack of Transportation (Non-Medical): No  Physical Activity: Sufficiently Active (02/14/2024)   Exercise Vital Sign    Days of Exercise per Week: 7 days    Minutes of Exercise per Session: 30 min  Stress: No Stress Concern Present (02/14/2024)   [employer] of Occupational Health - Occupational Stress Questionnaire    Feeling of Stress: Not at all  Social Connections: Patient Declined (02/14/2024)   Social Connection and Isolation Panel    Frequency of Communication with Friends and Family: Patient declined    Frequency of Social Gatherings with Friends and Family: Patient declined    Attends Religious Services: Patient declined    Database administrator or Organizations: Patient declined    Attends Engineer, structural: Patient  declined    Marital Status: Patient declined    Tobacco Counseling Ready to quit: Yes Counseling given: Yes Tobacco comments: 1/2ppd    Clinical Intake:  Pre-visit preparation completed: Yes  Pain : No/denies pain Pain  Score: 0-No pain     BMI - recorded: 28.5 Nutritional Status: BMI 25 -29 Overweight Nutritional Risks: None Diabetes: Yes CBG done?: No (telehealth visit.) Did pt. bring in CBG monitor from home?: No  Lab Results  Component Value Date   HGBA1C 5.5 01/29/2024   HGBA1C 6.0 (H) 10/05/2023   HGBA1C 7.1 (H) 05/11/2023     How often do you need to have someone help you when you read instructions, pamphlets, or other written materials from your doctor or pharmacy?: 1 - Never  Interpreter Needed?: No  Information entered by :: Joniyah Mallinger W CMA (AAMA)   Activities of Daily Living     02/14/2024    4:43 PM 07/10/2023    9:10 AM  In your present state of health, do you have any difficulty performing the following activities:  Hearing? 1 0  Comment has had testing in the past   Vision? 0 0  Difficulty concentrating or making decisions? 0 0  Walking or climbing stairs? 0   Dressing or bathing? 0   Doing errands, shopping? 0   Preparing Food and eating ? N   Using the Toilet? N   In the past six months, have you accidently leaked urine? N   Do you have problems with loss of bowel control? N   Managing your Medications? N   Managing your Finances? N   Housekeeping or managing your Housekeeping? N     Patient Care Team: Tobie Suzzane POUR, MD as PCP - General (Internal Medicine) Santo Stanly LABOR, MD as PCP - Cardiology (Cardiology) Shlomo Wilbert SAUNDERS, MD as PCP - Sleep Medicine (Cardiology) Inocencio Soyla Lunger, MD as PCP - Electrophysiology (Cardiology) Celestia Joesph SQUIBB, RN as Oncology Nurse Navigator (Oncology) Darroll Anes, DO (Optometry) Sheron Lorette CINDERELLA DEVONNA as Physician Assistant (Cardiology)  I have updated your Care Teams any recent Medical  Services you may have received from other providers in the past year.     Assessment:   This is a routine wellness examination for Stephen Roberts.  Hearing/Vision screen Hearing Screening - Comments:: Patient has difficulty hearing. Has had testing in the past. Defers additional testing for now  Vision Screening - Comments:: Patient wears reading glasses only. Up to date with yearly exams.  Anes Darroll    Goals Addressed               This Visit's Progress     I want to remain active and healthy (pt-stated)   On track       Depression Screen     02/14/2024    4:45 PM 01/29/2024    9:06 AM 05/23/2023    9:00 AM 04/27/2023   10:42 AM 04/12/2023    3:17 PM 02/13/2023    8:19 AM 11/24/2022    8:14 AM  PHQ 2/9 Scores  PHQ - 2 Score 0 0 0 0 0 0 0  PHQ- 9 Score 0 0  0 0 0 0     Fall Risk     02/14/2024    4:44 PM 01/29/2024    9:05 AM 05/23/2023    9:00 AM 04/27/2023   10:55 AM 04/27/2023   10:42 AM  Fall Risk   Falls in the past year? 0 0 0 0 0  Number falls in past yr: 0 0 0 0 0  Injury with Fall? 0 0 0 0 0  Risk for fall due to : No Fall Risks No Fall Risks No Fall Risks No Fall Risks No  Fall Risks  Follow up Falls evaluation completed;Education provided;Falls prevention discussed Falls evaluation completed Falls evaluation completed Falls evaluation completed Falls evaluation completed    MEDICARE RISK AT HOME:  Medicare Risk at Home Any stairs in or around the home?: Yes If so, are there any without handrails?: No Home free of loose throw rugs in walkways, pet beds, electrical cords, etc?: Yes Adequate lighting in your home to reduce risk of falls?: Yes Life alert?: No Use of a cane, walker or w/c?: No Grab bars in the bathroom?: No Shower chair or bench in shower?: No Elevated toilet seat or a handicapped toilet?: No  TIMED UP AND GO:  Was the test performed?  No  Cognitive Function: 6CIT completed    02/03/2022    8:15 AM  MMSE - Mini Mental State Exam  Not  completed: Unable to complete        02/14/2024    4:45 PM 02/13/2023    8:13 AM 02/03/2022    8:15 AM  6CIT Screen  What Year? 0 points 0 points 0 points  What month? 0 points 0 points 0 points  What time? 0 points 0 points 0 points  Count back from 20 0 points 0 points 0 points  Months in reverse 0 points 0 points 0 points  Repeat phrase 0 points 0 points 0 points  Total Score 0 points 0 points 0 points    Immunizations Immunization History  Administered Date(s) Administered   Influenza, Seasonal, Injecte, Preservative Fre 01/29/2024   Influenza,inj,Quad PF,6+ Mos 02/13/2019, 04/01/2020, 03/07/2022   Influenza,inj,quad, With Preservative 02/27/2018   Influenza-Unspecified 02/27/2018   Moderna Sars-Covid-2 Vaccination 10/06/2019, 11/03/2019   Pneumococcal Polysaccharide-23 07/30/2014   Tdap 01/20/2023   Zoster Recombinant(Shingrix) 01/20/2023, 03/21/2023    Screening Tests Health Maintenance  Topic Date Due   Pneumococcal Vaccine: 50+ Years (2 of 2 - PCV) 07/30/2015   COVID-19 Vaccine (3 - Moderna risk series) 12/01/2019   Diabetic kidney evaluation - Urine ACR  09/02/2022   Medicare Annual Wellness (AWV)  02/13/2024   FOOT EXAM  04/11/2024   Lung Cancer Screening  05/29/2024   HEMOGLOBIN A1C  07/28/2024   Diabetic kidney evaluation - eGFR measurement  09/19/2024   OPHTHALMOLOGY EXAM  11/01/2024   Colonoscopy  03/26/2025   DTaP/Tdap/Td (2 - Td or Tdap) 01/19/2033   Influenza Vaccine  Completed   Hepatitis C Screening  Completed   HIV Screening  Completed   Zoster Vaccines- Shingrix  Completed   Hepatitis B Vaccines 19-59 Average Risk  Aged Out   HPV VACCINES  Aged Out   Meningococcal B Vaccine  Aged Out    Health Maintenance Health Maintenance Due  Topic Date Due   Pneumococcal Vaccine: 50+ Years (2 of 2 - PCV) 07/30/2015   COVID-19 Vaccine (3 - Moderna risk series) 12/01/2019   Diabetic kidney evaluation - Urine ACR  09/02/2022   Medicare Annual Wellness  (AWV)  02/13/2024   Health Maintenance Items Addressed: Provider made aware of diabetic kidney function screening that is due.   Additional Screening:  Vision Screening: Recommended annual ophthalmology exams for early detection of glaucoma and other disorders of the eye. Would you like a referral to an eye doctor? No    Dental Screening: Recommended annual dental exams for proper oral hygiene  Community Resource Referral / Chronic Care Management: CRR required this visit?  No   CCM required this visit?  No   Plan:    I have personally reviewed and  noted the following in the patient's chart:   Medical and social history Use of alcohol, tobacco or illicit drugs  Current medications and supplements including opioid prescriptions. Patient is not currently taking opioid prescriptions. Functional ability and status Nutritional status Physical activity Advanced directives List of other physicians Hospitalizations, surgeries, and ER visits in previous 12 months Vitals Screenings to include cognitive, depression, and falls Referrals and appointments  In addition, I have reviewed and discussed with patient certain preventive protocols, quality metrics, and best practice recommendations. A written personalized care plan for preventive services as well as general preventive health recommendations were provided to patient.   Jamian Andujo, CMA   02/14/2024   After Visit Summary: (MyChart) Due to this being a telephonic visit, the after visit summary with patients personalized plan was offered to patient via MyChart   Notes: Nothing significant to report at this time.

## 2024-02-25 ENCOUNTER — Other Ambulatory Visit: Payer: Self-pay | Admitting: Internal Medicine

## 2024-02-25 DIAGNOSIS — E782 Mixed hyperlipidemia: Secondary | ICD-10-CM

## 2024-04-09 ENCOUNTER — Other Ambulatory Visit: Payer: Self-pay | Admitting: Internal Medicine

## 2024-04-09 DIAGNOSIS — K219 Gastro-esophageal reflux disease without esophagitis: Secondary | ICD-10-CM

## 2024-04-24 ENCOUNTER — Emergency Department (HOSPITAL_COMMUNITY)

## 2024-04-24 ENCOUNTER — Ambulatory Visit: Payer: Self-pay

## 2024-04-24 ENCOUNTER — Emergency Department (HOSPITAL_COMMUNITY)
Admission: EM | Admit: 2024-04-24 | Discharge: 2024-04-24 | Disposition: A | Discharge status: Home / Self Care | Source: Ambulatory Visit | Attending: Emergency Medicine | Admitting: Emergency Medicine

## 2024-04-24 ENCOUNTER — Telehealth: Payer: Self-pay

## 2024-04-24 ENCOUNTER — Encounter (HOSPITAL_COMMUNITY): Payer: Self-pay

## 2024-04-24 ENCOUNTER — Other Ambulatory Visit: Payer: Self-pay

## 2024-04-24 DIAGNOSIS — K2900 Acute gastritis without bleeding: Secondary | ICD-10-CM | POA: Insufficient documentation

## 2024-04-24 DIAGNOSIS — R197 Diarrhea, unspecified: Secondary | ICD-10-CM | POA: Insufficient documentation

## 2024-04-24 DIAGNOSIS — Z7984 Long term (current) use of oral hypoglycemic drugs: Secondary | ICD-10-CM | POA: Insufficient documentation

## 2024-04-24 DIAGNOSIS — Z7901 Long term (current) use of anticoagulants: Secondary | ICD-10-CM | POA: Insufficient documentation

## 2024-04-24 DIAGNOSIS — Z794 Long term (current) use of insulin: Secondary | ICD-10-CM | POA: Diagnosis not present

## 2024-04-24 DIAGNOSIS — E119 Type 2 diabetes mellitus without complications: Secondary | ICD-10-CM | POA: Insufficient documentation

## 2024-04-24 DIAGNOSIS — R109 Unspecified abdominal pain: Secondary | ICD-10-CM | POA: Diagnosis present

## 2024-04-24 DIAGNOSIS — K219 Gastro-esophageal reflux disease without esophagitis: Secondary | ICD-10-CM | POA: Diagnosis not present

## 2024-04-24 LAB — CBC WITH DIFFERENTIAL/PLATELET
Abs Immature Granulocytes: 0.01 K/uL (ref 0.00–0.07)
Basophils Absolute: 0 K/uL (ref 0.0–0.1)
Basophils Relative: 0 %
Eosinophils Absolute: 0.2 K/uL (ref 0.0–0.5)
Eosinophils Relative: 3 %
HCT: 46.9 % (ref 39.0–52.0)
Hemoglobin: 15.5 g/dL (ref 13.0–17.0)
Immature Granulocytes: 0 %
Lymphocytes Relative: 12 %
Lymphs Abs: 1 K/uL (ref 0.7–4.0)
MCH: 33.1 pg (ref 26.0–34.0)
MCHC: 33 g/dL (ref 30.0–36.0)
MCV: 100.2 fL — ABNORMAL HIGH (ref 80.0–100.0)
Monocytes Absolute: 0.7 K/uL (ref 0.1–1.0)
Monocytes Relative: 9 %
Neutro Abs: 6.1 K/uL (ref 1.7–7.7)
Neutrophils Relative %: 76 %
Platelets: 152 K/uL (ref 150–400)
RBC: 4.68 MIL/uL (ref 4.22–5.81)
RDW: 13.1 % (ref 11.5–15.5)
WBC: 8 K/uL (ref 4.0–10.5)
nRBC: 0 % (ref 0.0–0.2)

## 2024-04-24 LAB — URINALYSIS, ROUTINE W REFLEX MICROSCOPIC
Bilirubin Urine: NEGATIVE
Glucose, UA: NEGATIVE mg/dL
Hgb urine dipstick: NEGATIVE
Ketones, ur: NEGATIVE mg/dL
Leukocytes,Ua: NEGATIVE
Nitrite: NEGATIVE
Protein, ur: NEGATIVE mg/dL
Specific Gravity, Urine: 1.02 (ref 1.005–1.030)
pH: 5 (ref 5.0–8.0)

## 2024-04-24 LAB — COMPREHENSIVE METABOLIC PANEL WITH GFR
ALT: 19 U/L (ref 0–44)
AST: 23 U/L (ref 15–41)
Albumin: 4.3 g/dL (ref 3.5–5.0)
Alkaline Phosphatase: 102 U/L (ref 38–126)
Anion gap: 13 (ref 5–15)
BUN: 23 mg/dL — ABNORMAL HIGH (ref 6–20)
CO2: 22 mmol/L (ref 22–32)
Calcium: 9.6 mg/dL (ref 8.9–10.3)
Chloride: 102 mmol/L (ref 98–111)
Creatinine, Ser: 0.82 mg/dL (ref 0.61–1.24)
GFR, Estimated: >60 mL/min (ref >60)
Glucose, Bld: 109 mg/dL — ABNORMAL HIGH (ref 70–99)
Potassium: 4.2 mmol/L (ref 3.5–5.1)
Sodium: 136 mmol/L (ref 135–145)
Total Bilirubin: 0.4 mg/dL (ref 0.0–1.2)
Total Protein: 7.7 g/dL (ref 6.5–8.1)

## 2024-04-24 LAB — TROPONIN T, HIGH SENSITIVITY
Troponin T High Sensitivity: <15 ng/L (ref 0–19)
Troponin T High Sensitivity: <15 ng/L (ref 0–19)

## 2024-04-24 MED ORDER — SODIUM CHLORIDE 0.9 % IV SOLN: Freq: Once | INTRAVENOUS | Status: AC

## 2024-04-24 MED ORDER — ONDANSETRON HCL 4 MG PO TABS: 4.0000 mg | ORAL_TABLET | Freq: Four times a day (QID) | ORAL | 0 refills | Status: AC

## 2024-04-24 MED ORDER — LOPERAMIDE HCL 2 MG PO CAPS: 2.0000 mg | ORAL_CAPSULE | Freq: Four times a day (QID) | ORAL | 0 refills | Status: AC | PRN

## 2024-04-24 MED ORDER — ONDANSETRON HCL 4 MG/2ML IJ SOLN
4.0000 mg | Freq: Once | INTRAMUSCULAR | Status: AC
  Administered 2024-04-24: 4 mg via INTRAVENOUS
  Filled 2024-04-24: qty 2

## 2024-04-24 NOTE — Telephone Encounter (Signed)
 FYI Only or Action Required?: FYI only for provider: ED advised.  Patient was last seen in primary care on 01/29/2024 by Tobie Suzzane POUR, MD.  Called Nurse Triage reporting Diarrhea.  Symptoms began a week ago.  Interventions attempted: Rest, hydration, or home remedies.  Symptoms are: gradually worsening.  Triage Disposition: Go to ED Now (or PCP Triage) (overriding See HCP Within 4 Hours (Or PCP Triage))  Patient/caregiver understands and will follow disposition?: Yes  Copied from CRM [id number]. Topic: Clinical - Red Word Triage >> Apr 24, 2024  8:04 AM Emylou G wrote: [facility] that prompted transfer to Nurse Triage: can't stop going poo .SABRA Can't leave the house.SABRA going on for solid week.. he thinks its the ozempic.. Reason for Disposition  [1] SEVERE diarrhea (e.g., 7 or more times / day more than normal) AND [2] age > 60 years  Answer Assessment - Initial Assessment Questions Ozempic for one year, he feels this is causing his GI issues.    1. DIARRHEA SEVERITY: How bad is the diarrhea? How many more stools have you had in the past 24 hours than normal?      Severe, unable to make it to bathroom, frequent.  2. ONSET: When did the diarrhea begin?      One week ago 3. STOOL DESCRIPTION:  How loose or watery is the diarrhea? What is the stool color? Is there any blood or mucous in the stool?     Pure water  4. VOMITING: Are you also vomiting? If Yes, ask: How many times in the past 24 hours?      nausea 5. ABDOMEN PAIN: Are you having any abdomen pain? If Yes, ask: What does it feel like? (e.g., crampy, dull, intermittent, constant)      Intermittent that started 1.5 months ago.  6. ABDOMEN PAIN SEVERITY: If present, ask: How bad is the pain?  (e.g., Scale 1-10; mild, moderate, or severe)     Don't feel good 7. ORAL INTAKE: If vomiting, Have you been able to drink liquids? How much liquids have you had in the past 24 hours?     Drinking  8.  HYDRATION: Any signs of dehydration? (e.g., dry mouth [not just dry lips], too weak to stand, dizziness, new weight loss) When did you last urinate?     Mild weakness, intermittent lightheadedness. Feels he is becoming dehydrated.  9. EXPOSURE: Have you traveled to a foreign country recently? Have you been exposed to anyone with diarrhea? Could you have eaten any food that was spoiled?      10. ANTIBIOTIC USE: Are you taking antibiotics now or have you taken antibiotics in the past 2 months?        11. OTHER SYMPTOMS: Do you have any other symptoms? (e.g., fever, blood in stool)       Two weeks ago constipated used laxative. Acid reflux.  12. PREGNANCY: Is there any chance you are pregnant? When was your last menstrual period?  Protocols used: [facility]

## 2024-04-24 NOTE — Telephone Encounter (Signed)
 Noted ED advised

## 2024-04-24 NOTE — ED Provider Notes (Signed)
 [place] EMERGENCY DEPARTMENT AT [facility] Provider Note   CSN: [id number] Arrival date & time: 04/24/24  9090     Patient presents with: Chest Pain   Stephen Roberts is a 58 y.o. male.  History of past medical history of atrial fibrillation,left parotid mass, IDA, type II DM, HLD who presents to the ED with diarrhea nausea and abdominal pain that started last week.  States that the diarrhea is constant and has had about 6 episodes since 24 hours.  Denies blood in stool. Describes the abdominal pain as cramping which is diffuse in nature. he takes Ozempic for the past year.  He has a history of A-fib and takes Eliquis  however he has not taken over the past 2 days due to the nausea.  States that he had chest pain and palpitations this morning however that has resolved at this time. He has had reflux especially after eating spicy foods. Pt takes Nexium  in the morning.  No recent travel.  He took antibiotics about a month ago for dental procedures for about 4 days.  Denies sick contacts.  Able to drink fluids however is unable to tolerate food.       Chest Pain Associated symptoms: abdominal pain and nausea        Prior to Admission medications   Medication Sig Start Date End Date Taking? Authorizing Provider  loperamide (IMODIUM) 2 MG capsule Take 1 capsule (2 mg total) by mouth 4 (four) times daily as needed for diarrhea or loose stools. 04/24/24  Yes Vernon Ariel, PA-C  ondansetron  (ZOFRAN ) 4 MG tablet Take 1 tablet (4 mg total) by mouth every 6 (six) hours. 04/24/24  Yes Addilynne Olheiser, PA-C  acetaminophen  (TYLENOL ) 650 MG CR tablet Take 1,300 mg by mouth daily. Patient taking differently: Take 1,300 mg by mouth as needed.    [provider]  albuterol  (VENTOLIN  HFA) 108 (90 Base) MCG/ACT inhaler Inhale 2 puffs into the lungs every 6 (six) hours as needed for wheezing or shortness of breath. 01/29/24   Tobie Suzzane POUR, MD  apixaban  (ELIQUIS ) 5 MG TABS tablet Take  1 tablet by mouth twice daily 11/08/23   Santo Kelly A, MD  cetirizine (ZYRTEC) 10 MG tablet Take 10 mg by mouth at bedtime.    [provider]  esomeprazole  (NEXIUM ) 40 MG capsule Take 1 capsule by mouth once daily 04/09/24   Patel, Rutwik K, MD  fluticasone  (FLONASE ) 50 MCG/ACT nasal spray Place 2 sprays into both nostrils daily. 01/29/24   Tobie Suzzane POUR, MD  furosemide  (LASIX ) 20 MG tablet Take 1 tablet by mouth once daily 10/11/23   Santo Kelly A, MD  ibuprofen  (ADVIL ) 200 MG tablet Take 600 mg by mouth every 6 (six) hours as needed for mild pain (pain score 1-3) or moderate pain (pain score 4-6). Patient taking differently: Take 600 mg by mouth as needed for mild pain (pain score 1-3) or moderate pain (pain score 4-6).    [provider]  losartan  (COZAAR ) 25 MG tablet Take 0.5 tablets (12.5 mg total) by mouth daily. 11/29/23 02/27/24  Sheron Lorette GRADE, PA-C  metFORMIN  (GLUCOPHAGE ) 1000 MG tablet Take 500 mg by mouth 2 (two) times daily with a meal.    [provider]  metoprolol  succinate (TOPROL -XL) 100 MG 24 hr tablet Take 1 tablet by mouth once daily 08/17/23   Camnitz, Soyla Lunger, MD  Multiple Vitamin (MULTIVITAMIN) tablet Take 1 tablet by mouth daily.    [provider]  potassium chloride  (KLOR-CON  M) 10 MEQ tablet Take 1 tablet by mouth once daily 07/13/23   Aniceto Daphne CROME, NP  pravastatin  (PRAVACHOL ) 20 MG tablet TAKE 1 TABLET BY MOUTH THREE TIMES A WEEK 02/26/24   Patel, Rutwik K, MD  Probiotic Product (PROBIOTIC DAILY PO) Take 4 oz by mouth daily. Protein drink    [provider]  Semaglutide, 1 MG/DOSE, (OZEMPIC, 1 MG/DOSE,) 2 MG/1.5ML SOPN Inject 1 mg into the skin every 7 (seven) days. Sunday or Monday    [provider]  vitamin B-12 (CYANOCOBALAMIN ) 1000 MCG tablet Take 1,000 mcg by mouth daily.    [provider]    Allergies: Gabapentin, Lyrica [pregabalin], Crestor  [rosuvastatin ], and Farxiga   [dapagliflozin ]    Review of Systems  Gastrointestinal:  Positive for abdominal pain, diarrhea and nausea.    Updated Vital Signs BP 106/78   Pulse 68   Temp 98.1 F (36.7 C) (Oral)   Resp (!) 22   Ht 5' 9 (1.753 m)   Wt 87.5 kg   SpO2 94%   BMI 28.49 kg/m   Physical Exam Vitals and nursing note reviewed.  Constitutional:      General: He is not in acute distress.    Appearance: He is well-developed. He is not ill-appearing or diaphoretic.  HENT:     Head: Normocephalic and atraumatic.     Mouth/Throat:     Mouth: Mucous membranes are moist.  Eyes:     Conjunctiva/sclera: Conjunctivae normal.  Cardiovascular:     Rate and Rhythm: Normal rate and regular rhythm.     Heart sounds: No murmur heard. Pulmonary:     Effort: Pulmonary effort is normal. No respiratory distress.     Breath sounds: Normal breath sounds. No decreased air movement or transmitted upper airway sounds. No decreased breath sounds, wheezing, rhonchi or rales.  Abdominal:     General: Abdomen is flat. Bowel sounds are normal.     Palpations: Abdomen is soft.     Tenderness: There is no abdominal tenderness. There is no guarding or rebound.  Musculoskeletal:        General: No swelling.     Cervical back: Neck supple.  Skin:    General: Skin is warm and dry.     Capillary Refill: Capillary refill takes less than 2 seconds.  Neurological:     Mental Status: He is alert.  Psychiatric:        Mood and Affect: Mood normal.     (all labs ordered are listed, but only abnormal results are displayed) Labs Reviewed  CBC WITH DIFFERENTIAL/PLATELET - Abnormal; Notable for the following components:      Result Value   MCV 100.2 (*)    All other components within normal limits  COMPREHENSIVE METABOLIC PANEL WITH GFR - Abnormal; Notable for the following components:   Glucose, Bld 109 (*)    BUN 23 (*)    All other components within normal limits  URINALYSIS, ROUTINE W REFLEX MICROSCOPIC  TROPONIN T,  HIGH SENSITIVITY  TROPONIN T, HIGH SENSITIVITY    EKG: EKG Interpretation Date/Time:  Wednesday April 24 2024 09:48:03 EST Ventricular Rate:  102 PR Interval:  154 QRS Duration:  108 QT Interval:  352 QTC Calculation: 458 R Axis:   -32  Text Interpretation: Sinus tachycardia with frequent Premature ventricular complexes Left axis deviation Septal infarct (cited on or before 16-Jan-2024) Abnormal ECG When compared with ECG of 16-Jan-2024 14:18, No significant change was found Confirmed by Cleotilde Rogue (  [id number]) on 04/24/2024 9:55:59 AM  Radiology: [facility] Chest 2 View Result Date: 04/24/2024 CLINICAL DATA:  Chest pain EXAM: CHEST - 2 VIEW COMPARISON:  04/21/2023 FINDINGS: The lungs are clear without focal pneumonia, edema, pneumothorax or pleural effusion. The cardiopericardial silhouette is within normal limits for size. No acute bony abnormality. IMPRESSION: No active cardiopulmonary disease. Electronically Signed   By: Camellia Candle M.D.   On: 04/24/2024 10:27     Procedures   Medications Ordered in the ED  ondansetron  (ZOFRAN ) injection 4 mg (4 mg Intravenous Given 04/24/24 1344)  0.9 %  sodium chloride  infusion ( Intravenous New Bag/Given 04/24/24 1344)                                    Medical Decision Making Amount and/or Complexity of Data Reviewed Labs: ordered. Radiology: ordered.  Risk Prescription drug management.     This patient presents to the ED for concern of non-bloody diarrhea and nausea this involves an extensive number of treatment options, and is a complaint that carries with it a high risk of complications and morbidity.  Also complains of a abdominal bloating and heartburn. abdomen is soft nontender and the patient is well-appearing with no signs of dehydration.    Additional history obtained:  Additional history obtained from EMR External records from outside source obtained and reviewed including office visit from 01/29/2023    Lab  Tests:  I Ordered, and personally interpreted labs. Negative labs    Imaging Studies ordered:  I ordered imaging studies including chest x-ray I independently visualized and interpreted imaging which showed no pneumonia, lesion or any other abnormality I agree with the radiologist interpretation   Cardiac Monitoring: / EKG:  The patient was maintained on a cardiac monitor.  I personally viewed and interpreted the cardiac monitored which showed an underlying rhythm of: Normal sinus rhythm   Problem List / ED Course / Critical interventions / Medication management  GERD, gastritis, acute diarrhea I ordered medication including Zofran  and IV fluids.  Reevaluation of the patient after these medicines showed that the patient reported relief after Zofran  and IV fluids  I have reviewed the patients home medicines and have made adjustments as needed   Reviewed the labs and imaging with the patient. Since the labs and the physical exam is benign and is not necessary at this time.  Will send in Zofran  loperamide for diarrhea.  Recommended patient to eat bland and soft diet and drink fluids.  Suggested the patient to take Nexium  before meals to help with the GERD symptoms.  Restart Eliquis  and follow-up with PCP for further management.  Has agreement with the plan and is stable for discharge.        Final diagnoses:  Diarrhea, unspecified type  Other acute gastritis without hemorrhage  Gastroesophageal reflux disease without esophagitis    ED Discharge Orders          Ordered    ondansetron  (ZOFRAN ) 4 MG tablet  Every 6 hours        04/24/24 1438    loperamide (IMODIUM) 2 MG capsule  4 times daily PRN        04/24/24 1438               Story Conti, PA-C 04/24/24 1456    Cleotilde Rogue, MD 04/24/24 1731

## 2024-04-24 NOTE — Telephone Encounter (Signed)
 Copied from CRM #[id number]. Topic: Appointments - Scheduling Inquiry for Clinic >> Apr 24, 2024  3:45 PM Sophia H wrote: Reason for CRM:  Hospital follow up - stomach issues. 1wk - nothing on my end till end of January and patient was told he needed to follow up as soon as possible. Please reach out # [phone]

## 2024-04-24 NOTE — Telephone Encounter (Signed)
Appt scheduled 12/15

## 2024-04-24 NOTE — ED Triage Notes (Signed)
 Pt arrived via POV c/o recurrent chest pain and reports diarrhea for the past week. Pt reports he feels like he is in uncontrolled A-Fib. Pt endorses nausea as well.

## 2024-04-29 ENCOUNTER — Ambulatory Visit (INDEPENDENT_AMBULATORY_CARE_PROVIDER_SITE_OTHER): Admitting: Internal Medicine

## 2024-04-29 ENCOUNTER — Encounter: Payer: Self-pay | Admitting: Internal Medicine

## 2024-04-29 VITALS — BP 136/86 | HR 91 | Ht 69.0 in | Wt 197.0 lb

## 2024-04-29 DIAGNOSIS — R197 Diarrhea, unspecified: Secondary | ICD-10-CM | POA: Insufficient documentation

## 2024-04-29 DIAGNOSIS — Z7984 Long term (current) use of oral hypoglycemic drugs: Secondary | ICD-10-CM | POA: Diagnosis not present

## 2024-04-29 DIAGNOSIS — E1169 Type 2 diabetes mellitus with other specified complication: Secondary | ICD-10-CM

## 2024-04-29 DIAGNOSIS — Z09 Encounter for follow-up examination after completed treatment for conditions other than malignant neoplasm: Secondary | ICD-10-CM

## 2024-04-29 DIAGNOSIS — K219 Gastro-esophageal reflux disease without esophagitis: Secondary | ICD-10-CM

## 2024-04-29 MED ORDER — SUCRALFATE 1 G PO TABS: 1.0000 g | ORAL_TABLET | Freq: Three times a day (TID) | ORAL | 0 refills | Status: AC

## 2024-04-29 NOTE — Patient Instructions (Addendum)
 Please stop taking Metformin  for now.  Please keep food diary.  Please start taking Sucralfate  as prescribed.

## 2024-05-03 DIAGNOSIS — Z09 Encounter for follow-up examination after completed treatment for conditions other than malignant neoplasm: Secondary | ICD-10-CM | POA: Insufficient documentation

## 2024-05-03 NOTE — Assessment & Plan Note (Signed)
 Uncontrolled recently, likely due to recent gastroenteritis Takes Nexium  40 mg QD Added sucralfate  as needed for persistent epigastric pain Avoid hot and spicy food

## 2024-05-03 NOTE — Assessment & Plan Note (Signed)
 Lab Results  Component Value Date   HGBA1C 5.5 01/29/2024   Well-controlled Associated with HTN, HLD and obesity On Metformin  500 mg twice daily - advised to stop metformin  for now due to recent diarrhea On Ozempic - now at 1 mg qw dose through patient assistance Advised to follow diabetic diet F/u CMP and lipid panel On statin and ARB now Diabetic eye exam: Advised to follow up with Ophthalmology for diabetic eye exam

## 2024-05-03 NOTE — Assessment & Plan Note (Signed)
 ER chart reviewed, including blood tests Likely episode of gastroenteritis, now slowly improving

## 2024-05-03 NOTE — Assessment & Plan Note (Addendum)
 Likely due to acute gastroenteritis Symptoms slowly improving, but still has loose BM Advised to stop metformin  for now Maintain adequate hydration and eat at regular intervals If persistent or worsening symptoms, will get stool study

## 2024-05-03 NOTE — Progress Notes (Signed)
 "  Established Patient Office Visit  Subjective:  Patient ID: Stephen Roberts, male    DOB: 11-02-1965  Age: 58 y.o. MRN: [id number]  CC:  Chief Complaint  Patient presents with   Follow-up    ER follow up    HPI Stephen Roberts is a 58 y.o. male with past medical history of atrial fibrillation,left parotid mass, IDA, type II DM, HLD, chronic neck pain and tobacco abuse who presents for f/u of recent ER visit on 04/24/24.  He went to ER for complaint of watery diarrhea, nausea and abdominal pain for the 1 week prior to ER visit.  He also had abdominal pain, which was cramping and diffuse in nature.  Denied any episode of melena or hematochezia.  He had CBC, CMP and UA in the ER, which were overall unremarkable.  Today, he reports improvement in diarrhea, but still has loose BM at times.  He reports epigastric pain currently, worse after eating.  He currently takes Nexium  40 mg QD for GERD.  Past Medical History:  Diagnosis Date   A-fib (HCC)    Arthritis    Diabetes (HCC)    GERD (gastroesophageal reflux disease)    Heart murmur    IDA (iron  deficiency anemia)    OSA (obstructive sleep apnea)    moderate obstructive sleep apnea with an AHI of 18.1/h   Pneumonia 06/27/2006    Past Surgical History:  Procedure Laterality Date   A-FLUTTER ABLATION N/A 10/12/2023   Procedure: A-FLUTTER ABLATION;  Surgeon: Inocencio Soyla Lunger, MD;  Location: [facility];  Service: Cardiovascular;  Laterality: N/A;   ANTERIOR FUSION CERVICAL SPINE     ATRIAL FIBRILLATION ABLATION N/A 10/12/2023   Procedure: ATRIAL FIBRILLATION ABLATION;  Surgeon: Inocencio Soyla Lunger, MD;  Location: [facility];  Service: Cardiovascular;  Laterality: N/A;   CARDIOVERSION N/A 10/05/2021   Procedure: CARDIOVERSION;  Surgeon: Francyne Headland, MD;  Location: [facility];  Service: Cardiovascular;  Laterality: N/A;   CARDIOVERSION N/A 11/18/2021   Procedure: CARDIOVERSION;  Surgeon: Sheena Pugh, DO;   Location: [facility];  Service: Cardiovascular;  Laterality: N/A;   CARDIOVERSION N/A 07/10/2023   Procedure: CARDIOVERSION;  Surgeon: Delford Maude BROCKS, MD;  Location: [facility];  Service: Cardiovascular;  Laterality: N/A;   COLONOSCOPY WITH PROPOFOL  N/A 03/26/2020   Procedure: COLONOSCOPY WITH PROPOFOL ;  Surgeon: Shaaron Lamar HERO, MD;  Location: [facility];  Service: Endoscopy;  Laterality: N/A;   disc in neck Left 05/2017   ESOPHAGEAL DILATION N/A 03/25/2020   Procedure: ESOPHAGEAL DILATION;  Surgeon: Golda Claudis PENNER, MD;  Location: [facility];  Service: Endoscopy;  Laterality: N/A;   ESOPHAGOGASTRODUODENOSCOPY (EGD) WITH PROPOFOL  N/A 03/25/2020   Procedure: ESOPHAGOGASTRODUODENOSCOPY (EGD) WITH PROPOFOL ;  Surgeon: Golda Claudis PENNER, MD;  Location: [facility];  Service: Endoscopy;  Laterality: N/A;   GIVENS CAPSULE STUDY N/A 04/13/2020   Procedure: GIVENS CAPSULE STUDY;  Surgeon: Shaaron Lamar HERO, MD;  Location: [facility];  Service: Endoscopy;  Laterality: N/A;  7:30am   INSERTION OF MESH N/A 01/23/2013   Procedure: INSERTION OF MESH;  Surgeon: Elon HERO Pacini, MD;  Location: [facility];  Service: General;  Laterality: N/A;   LEFT HEART CATH AND CORONARY ANGIOGRAPHY N/A 08/27/2021   Procedure: LEFT HEART CATH AND CORONARY ANGIOGRAPHY;  Surgeon: Jordan, Peter M, MD;  Location: [facility];  Service: Cardiovascular;  Laterality: N/A;   POLYPECTOMY  03/26/2020   Procedure: POLYPECTOMY;  Surgeon: Shaaron Lamar  M, MD;  Location: [facility];  Service: Endoscopy;;   TONSILLECTOMY     as child   VENTRAL HERNIA REPAIR N/A 01/23/2013   Procedure: LAPAROSCOPIC MULTIPLE VENTRAL HERNIAS;  Surgeon: Elon CHRISTELLA Pacini, MD;  Location: [facility];  Service: General;  Laterality: N/A;    Family History  Problem Relation Age of Onset   Depression Mother    Hearing loss Mother    Hyperlipidemia Mother    Hypertension Mother    Stroke Mother    Early death Father    Diabetes Brother     Pancreatic cancer Paternal Uncle    Colon cancer Neg Hx     Social History   Socioeconomic History   Marital status: Married    Spouse name: Not on file   Number of children: Not on file   Years of education: Not on file   Highest education level: Not on file  Occupational History   Not on file  Tobacco Use   Smoking status: Every Day    Current packs/day: 0.75    Average packs/day: 0.8 packs/day for 46.5 years (34.8 ttl pk-yrs)    Types: Cigarettes    Start date: 1980    Passive exposure: Current   Smokeless tobacco: Never   Tobacco comments:    1/2ppd  Vaping Use   Vaping status: Never Used  Substance and Sexual Activity   Alcohol use: Yes    Alcohol/week: 4.0 standard drinks of alcohol    Types: 4 Cans of beer per week    Comment: moderately   Drug use: Not Currently    Types: Cocaine    Comment: states he has tried it all. Last used cocaine a few years ago.SABRA   Sexual activity: Yes  Other Topics Concern   Not on file  Social History Narrative   Not on file   Social Drivers of Health   Tobacco Use: High Risk (04/29/2024)   Patient History    Smoking Tobacco Use: Every Day    Smokeless Tobacco Use: Never    Passive Exposure: Current  Financial Resource Strain: Low Risk (02/14/2024)   Overall Financial Resource Strain (CARDIA)    Difficulty of Paying Living Expenses: Not hard at all  Food Insecurity: No Food Insecurity (02/14/2024)   Epic    Worried About Programme Researcher, Broadcasting/film/video in the Last Year: Never true    Ran Out of Food in the Last Year: Never true  Transportation Needs: No Transportation Needs (02/14/2024)   Epic    Lack of Transportation (Medical): No    Lack of Transportation (Non-Medical): No  Physical Activity: Sufficiently Active (02/14/2024)   Exercise Vital Sign    Days of Exercise per Week: 7 days    Minutes of Exercise per Session: 30 min  Stress: No Stress Concern Present (02/14/2024)   [employer] of Occupational Health - Occupational  Stress Questionnaire    Feeling of Stress: Not at all  Social Connections: Patient Declined (02/14/2024)   Social Connection and Isolation Panel    Frequency of Communication with Friends and Family: Patient declined    Frequency of Social Gatherings with Friends and Family: Patient declined    Attends Religious Services: Patient declined    Active Member of Clubs or Organizations: Patient declined    Attends Banker Meetings: Patient declined    Marital Status: Patient declined  Intimate Partner Violence: Not At Risk (02/14/2024)   Epic    Fear of Current or Ex-Partner: No  Emotionally Abused: No    Physically Abused: No    Sexually Abused: No  Depression (PHQ2-9): Low Risk (04/29/2024)   Depression (PHQ2-9)    PHQ-2 Score: 0  Alcohol Screen: Low Risk (02/14/2024)   Alcohol Screen    Last Alcohol Screening Score (AUDIT): 0  Housing: Low Risk (02/14/2024)   Epic    Unable to Pay for Housing in the Last Year: No    Number of Times Moved in the Last Year: 0    Homeless in the Last Year: No  Utilities: Not At Risk (02/14/2024)   Epic    Threatened with loss of utilities: No  Health Literacy: Adequate Health Literacy (02/14/2024)   B1300 Health Literacy    Frequency of need for help with medical instructions: Never    Outpatient Medications Prior to Visit  Medication Sig Dispense Refill   acetaminophen  (TYLENOL ) 650 MG CR tablet Take 1,300 mg by mouth daily. (Patient taking differently: Take 1,300 mg by mouth as needed.)     albuterol  (VENTOLIN  HFA) 108 (90 Base) MCG/ACT inhaler Inhale 2 puffs into the lungs every 6 (six) hours as needed for wheezing or shortness of breath. 18 g 2   apixaban  (ELIQUIS ) 5 MG TABS tablet Take 1 tablet by mouth twice daily 180 tablet 1   cetirizine (ZYRTEC) 10 MG tablet Take 10 mg by mouth at bedtime.     esomeprazole  (NEXIUM ) 40 MG capsule Take 1 capsule by mouth once daily 90 capsule 0   fluticasone  (FLONASE ) 50 MCG/ACT nasal spray Place  2 sprays into both nostrils daily. 16 g 6   furosemide  (LASIX ) 20 MG tablet Take 1 tablet by mouth once daily 90 tablet 3   ibuprofen  (ADVIL ) 200 MG tablet Take 600 mg by mouth every 6 (six) hours as needed for mild pain (pain score 1-3) or moderate pain (pain score 4-6). (Patient taking differently: Take 600 mg by mouth as needed for mild pain (pain score 1-3) or moderate pain (pain score 4-6).)     loperamide  (IMODIUM ) 2 MG capsule Take 1 capsule (2 mg total) by mouth 4 (four) times daily as needed for diarrhea or loose stools. 12 capsule 0   losartan  (COZAAR ) 25 MG tablet Take 0.5 tablets (12.5 mg total) by mouth daily. 45 tablet 3   metoprolol  succinate (TOPROL -XL) 100 MG 24 hr tablet Take 1 tablet by mouth once daily 90 tablet 3   Multiple Vitamin (MULTIVITAMIN) tablet Take 1 tablet by mouth daily.     ondansetron  (ZOFRAN ) 4 MG tablet Take 1 tablet (4 mg total) by mouth every 6 (six) hours. 12 tablet 0   potassium chloride  (KLOR-CON  M) 10 MEQ tablet Take 1 tablet by mouth once daily 90 tablet 3   pravastatin  (PRAVACHOL ) 20 MG tablet TAKE 1 TABLET BY MOUTH THREE TIMES A WEEK 90 tablet 3   Probiotic Product (PROBIOTIC DAILY PO) Take 4 oz by mouth daily. Protein drink     Semaglutide, 1 MG/DOSE, (OZEMPIC, 1 MG/DOSE,) 2 MG/1.5ML SOPN Inject 1 mg into the skin every 7 (seven) days. Sunday or Monday     vitamin B-12 (CYANOCOBALAMIN ) 1000 MCG tablet Take 1,000 mcg by mouth daily.     metFORMIN  (GLUCOPHAGE ) 1000 MG tablet Take 500 mg by mouth 2 (two) times daily with a meal.     Facility-Administered Medications Prior to Visit  Medication Dose Route Frequency Provider Last Rate Last Admin   iron  sucrose (VENOFER ) 200 mg in sodium chloride  0.9 % 100 mL IVPB  200 mg Intravenous Once Pennington, Rebekah M, PA-C        Allergies[1]  ROS Review of Systems  Constitutional:  Negative for chills and fever.  HENT:  Positive for congestion. Negative for sore throat.   Eyes:  Negative for pain and  discharge.  Respiratory:  Positive for shortness of breath. Negative for cough.   Cardiovascular:  Positive for palpitations (Intermittent). Negative for chest pain.  Gastrointestinal:  Positive for diarrhea and nausea. Negative for vomiting.  Endocrine: Negative for polydipsia and polyuria.  Genitourinary:  Negative for dysuria and hematuria.  Musculoskeletal:  Positive for back pain and neck pain. Negative for neck stiffness.  Skin:  Negative for rash.  Neurological:  Negative for dizziness, weakness and numbness.  Psychiatric/Behavioral:  Positive for sleep disturbance. Negative for agitation and behavioral problems.       Objective:    Physical Exam Vitals reviewed.  Constitutional:      General: He is not in acute distress.    Appearance: He is not diaphoretic.  HENT:     Head: Normocephalic and atraumatic.     Nose: Congestion present.     Mouth/Throat:     Mouth: Mucous membranes are moist.  Eyes:     General: No scleral icterus.    Extraocular Movements: Extraocular movements intact.  Cardiovascular:     Rate and Rhythm: Normal rate and regular rhythm.     Heart sounds: Normal heart sounds. No murmur heard. Pulmonary:     Breath sounds: Normal breath sounds. No wheezing or rales.  Abdominal:     Palpations: Abdomen is soft.     Tenderness: There is abdominal tenderness (Mild, epigastric).  Musculoskeletal:     Cervical back: Neck supple. No tenderness.     Right lower leg: No edema.     Left lower leg: No edema.  Skin:    General: Skin is warm.     Findings: No rash.  Neurological:     General: No focal deficit present.     Mental Status: He is alert and oriented to person, place, and time.     Sensory: No sensory deficit.     Motor: No weakness.  Psychiatric:        Mood and Affect: Mood normal.        Behavior: Behavior normal.     BP 136/86 (BP Location: Left Arm)   Pulse 91   Ht 5' 9 (1.753 m)   Wt 197 lb (89.4 kg)   SpO2 98%   BMI 29.09 kg/m   Wt Readings from Last 3 Encounters:  04/29/24 197 lb (89.4 kg)  04/24/24 192 lb 14.4 oz (87.5 kg)  02/14/24 193 lb (87.5 kg)    Lab Results  Component Value Date   TSH 1.910 03/08/2022   Lab Results  Component Value Date   WBC 8.0 04/24/2024   HGB 15.5 04/24/2024   HCT 46.9 04/24/2024   MCV 100.2 (H) 04/24/2024   PLT 152 04/24/2024   Lab Results  Component Value Date   NA 136 04/24/2024   K 4.2 04/24/2024   CO2 22 04/24/2024   GLUCOSE 109 (H) 04/24/2024   BUN 23 (H) 04/24/2024   CREATININE 0.82 04/24/2024   BILITOT 0.4 04/24/2024   ALKPHOS 102 04/24/2024   AST 23 04/24/2024   ALT 19 04/24/2024   PROT 7.7 04/24/2024   ALBUMIN 4.3 04/24/2024   CALCIUM  9.6 04/24/2024   ANIONGAP 13 04/24/2024   EGFR 103 09/20/2023   Lab Results  Component Value Date   CHOL 159 05/11/2023   Lab Results  Component Value Date   HDL 37 (L) 05/11/2023   Lab Results  Component Value Date   LDLCALC 96 05/11/2023   Lab Results  Component Value Date   TRIG 146 05/11/2023   Lab Results  Component Value Date   CHOLHDL 4.3 05/11/2023   Lab Results  Component Value Date   HGBA1C 5.5 01/29/2024      Assessment & Plan:   Problem List Items Addressed This Visit       Digestive   GERD (gastroesophageal reflux disease)   Uncontrolled recently, likely due to recent gastroenteritis Takes Nexium  40 mg QD Added sucralfate  as needed for persistent epigastric pain Avoid hot and spicy food      Relevant Medications   sucralfate  (CARAFATE ) 1 g tablet     Endocrine   Type 2 diabetes mellitus with other specified complication (HCC)   Lab Results  Component Value Date   HGBA1C 5.5 01/29/2024   Well-controlled Associated with HTN, HLD and obesity On Metformin  500 mg twice daily - advised to stop metformin  for now due to recent diarrhea On Ozempic - now at 1 mg qw dose through patient assistance Advised to follow diabetic diet F/u CMP and lipid panel On statin and ARB  now Diabetic eye exam: Advised to follow up with Ophthalmology for diabetic eye exam        Other   Diarrhea   Likely due to acute gastroenteritis Symptoms slowly improving, but still has loose BM Advised to stop metformin  for now Maintain adequate hydration and eat at regular intervals If persistent or worsening symptoms, will get stool study      Encounter for examination following treatment at hospital - Primary   ER chart reviewed, including blood tests Likely episode of gastroenteritis, now slowly improving       Meds ordered this encounter  Medications   sucralfate  (CARAFATE ) 1 g tablet    Sig: Take 1 tablet (1 g total) by mouth 4 (four) times daily -  with meals and at bedtime.    Dispense:  60 tablet    Refill:  0    Follow-up: Return if symptoms worsen or fail to improve.    Suzzane MARLA Blanch, MD    [1]  Allergies Allergen Reactions   Gabapentin Shortness Of Breath   Lyrica [Pregabalin] Shortness Of Breath   Crestor  [Rosuvastatin ] Other (See Comments)    myalgia   Farxiga  [Dapagliflozin ]     Developed UTI   "

## 2024-05-06 NOTE — Progress Notes (Signed)
 Pharmacy Quality Measure Review  This patient is appearing on a report for being at risk of failing the adherence measure for hypertension (ACEi/ARB) medications this calendar year.   Medication: Losartan  25 mg Last fill date: 04/09/24 for 90 day supply  Insurance report was not up to date. No action needed at this time.   Jenkins Graces, PharmD PGY1 Pharmacy Resident

## 2024-05-17 NOTE — Progress Notes (Signed)
 Stephen Roberts                                          MRN: [id number]   05/17/2024   The [facility] Quality Team Specialist reviewed this patient medical record for the purposes of chart review for care gap closure. The following were reviewed: chart review for care gap closure-kidney health evaluation for diabetes:eGFR  and uACR.    [facility] Quality Team

## 2024-05-30 ENCOUNTER — Ambulatory Visit (HOSPITAL_COMMUNITY)
Admission: RE | Admit: 2024-05-30 | Discharge: 2024-05-30 | Disposition: A | Discharge status: Home / Self Care | Source: Ambulatory Visit | Attending: Acute Care | Admitting: Acute Care

## 2024-05-30 DIAGNOSIS — Z122 Encounter for screening for malignant neoplasm of respiratory organs: Secondary | ICD-10-CM | POA: Insufficient documentation

## 2024-05-30 DIAGNOSIS — Z87891 Personal history of nicotine dependence: Secondary | ICD-10-CM | POA: Insufficient documentation

## 2024-05-30 DIAGNOSIS — F17210 Nicotine dependence, cigarettes, uncomplicated: Secondary | ICD-10-CM | POA: Diagnosis present

## 2024-06-03 ENCOUNTER — Ambulatory Visit: Admitting: Internal Medicine

## 2024-06-10 ENCOUNTER — Other Ambulatory Visit: Payer: Self-pay

## 2024-06-10 DIAGNOSIS — Z87891 Personal history of nicotine dependence: Secondary | ICD-10-CM

## 2024-06-10 DIAGNOSIS — F17210 Nicotine dependence, cigarettes, uncomplicated: Secondary | ICD-10-CM

## 2024-06-10 DIAGNOSIS — Z122 Encounter for screening for malignant neoplasm of respiratory organs: Secondary | ICD-10-CM

## 2024-06-18 ENCOUNTER — Other Ambulatory Visit: Payer: Self-pay | Admitting: Internal Medicine

## 2024-06-18 DIAGNOSIS — I480 Paroxysmal atrial fibrillation: Secondary | ICD-10-CM

## 2024-06-19 ENCOUNTER — Ambulatory Visit: Admitting: Internal Medicine

## 2024-06-19 ENCOUNTER — Encounter: Payer: Self-pay | Admitting: Internal Medicine

## 2024-06-19 VITALS — BP 134/70 | HR 69 | Ht 69.0 in | Wt 193.0 lb

## 2024-06-19 DIAGNOSIS — E559 Vitamin D deficiency, unspecified: Secondary | ICD-10-CM | POA: Diagnosis not present

## 2024-06-19 DIAGNOSIS — Z0001 Encounter for general adult medical examination with abnormal findings: Secondary | ICD-10-CM | POA: Diagnosis not present

## 2024-06-19 DIAGNOSIS — I10 Essential (primary) hypertension: Secondary | ICD-10-CM | POA: Diagnosis not present

## 2024-06-19 DIAGNOSIS — Z7985 Long-term (current) use of injectable non-insulin antidiabetic drugs: Secondary | ICD-10-CM

## 2024-06-19 DIAGNOSIS — J449 Chronic obstructive pulmonary disease, unspecified: Secondary | ICD-10-CM | POA: Diagnosis not present

## 2024-06-19 DIAGNOSIS — Z125 Encounter for screening for malignant neoplasm of prostate: Secondary | ICD-10-CM

## 2024-06-19 DIAGNOSIS — E782 Mixed hyperlipidemia: Secondary | ICD-10-CM | POA: Diagnosis not present

## 2024-06-19 DIAGNOSIS — Z23 Encounter for immunization: Secondary | ICD-10-CM

## 2024-06-19 DIAGNOSIS — E1169 Type 2 diabetes mellitus with other specified complication: Secondary | ICD-10-CM

## 2024-06-19 DIAGNOSIS — I480 Paroxysmal atrial fibrillation: Secondary | ICD-10-CM

## 2024-06-19 MED ORDER — SEMAGLUTIDE (1 MG/DOSE) 4 MG/3ML ~~LOC~~ SOPN: 1.0000 mg | PEN_INJECTOR | SUBCUTANEOUS | 5 refills | Status: AC

## 2024-06-19 NOTE — Progress Notes (Signed)
 "  Established Patient Office Visit  Subjective:  Patient ID: Stephen Roberts, male    DOB: [birth year]  Age: 59 y.o. MRN: [id number]  CC:  Chief Complaint  Patient presents with   Medical Management of Chronic Issues    Six month follow up    Annual Exam    HPI Stephen Roberts is a 59 y.o. male with past medical history of atrial fibrillation,left parotid mass, IDA, type II DM, HLD, chronic neck pain and tobacco abuse who presents for f/u of his chronic medical conditions.  Type II DM: He has been taking Ozempic , now at 1 mg QW dose. He has lost about 47 lbs since starting Ozempic . His HbA1c was 5.5 in 09/25.  Does not check blood glucose regularly. Denies any polyuria or polyphagia currently. He admits that he needs to improve his diet.  Atrial fibrillation: He is in sinus rhythm now, s/p cardiac ablation in 05/25.  He takes metoprolol  for rate control and Eliquis  for [facility].  Followed by cardiology.  He reports intermittent palpitations, for which he takes additional dose of metoprolol , which improves his symptoms.   COPD: He has mild, intermittent dyspnea, for which he uses albuterol  inhaler.  Denies any wheezing currently. Denies any fever or chills currently.  He still smokes about 0.5 pack/day and is trying to quit.   Allergic rhinitis: He reports nasal congestion, sinus pressure related headache and postnasal drip for the last few months.  Denies any fever or chills.  He has tried using Flonase  and nasal saline with mild relief.      Past Medical History:  Diagnosis Date   A-fib (HCC)    Arthritis    Diabetes (HCC)    GERD (gastroesophageal reflux disease)    Heart murmur    IDA (iron  deficiency anemia)    OSA (obstructive sleep apnea)    moderate obstructive sleep apnea with an AHI of 18.1/h   Pneumonia 06/27/2006    Past Surgical History:  Procedure Laterality Date   A-FLUTTER ABLATION N/A 10/12/2023   Procedure: A-FLUTTER ABLATION;  Surgeon: Inocencio Soyla Lunger, MD;   Location: [facility];  Service: Cardiovascular;  Laterality: N/A;   ANTERIOR FUSION CERVICAL SPINE     ATRIAL FIBRILLATION ABLATION N/A 10/12/2023   Procedure: ATRIAL FIBRILLATION ABLATION;  Surgeon: Inocencio Soyla Lunger, MD;  Location: [facility];  Service: Cardiovascular;  Laterality: N/A;   CARDIOVERSION N/A 10/05/2021   Procedure: CARDIOVERSION;  Surgeon: Francyne Headland, MD;  Location: [facility] ENDOS[facility];  Service: Cardiovascular;  Laterality: N/A;   CARDIOVERSION N/A 11/18/2021   Procedure: CARDIOVERSION;  Surgeon: Sheena Pugh, DO;  Location: [facility]OS[facility];  Service: Cardiovascular;  Laterality: N/A;   CARDIOVERSION N/A 07/10/2023   Procedure: CARDIOVERSION;  Surgeon: Delford Maude BROCKS, MD;  Location: [facility];  Service: Cardiovascular;  Laterality: N/A;   COLONOSCOPY WITH PROPOFOL  N/A 03/26/2020   Procedure: COLONOSCOPY WITH PROPOFOL ;  Surgeon: Shaaron Lamar HERO, MD;  Location: [facility];  Service: Endoscopy;  Laterality: N/A;   disc in neck Left 05/2017   ESOPHAGEAL DILATION N/A 03/25/2020   Procedure: ESOPHAGEAL DILATION;  Surgeon: Golda Claudis PENNER, MD;  Location: [facility];  Service: Endoscopy;  Laterality: N/A;   ESOPHAGOGASTRODUODENOSCOPY (EGD) WITH PROPOFOL  N/A 03/25/2020   Procedure: ESOPHAGOGASTRODUODENOSCOPY (EGD) WITH PROPOFOL ;  Surgeon: Golda Claudis PENNER, MD;  Location: [facility];  Service: Endoscopy;  Laterality: N/A;   GIVENS CAPSULE STUDY N/A 04/13/2020   Procedure: GIVENS CAPSULE STUDY;  Surgeon:  Rourk, Lamar HERO, MD;  Location: [facility];  Service: Endoscopy;  Laterality: N/A;  7:30am   INSERTION OF MESH N/A 01/23/2013   Procedure: INSERTION OF MESH;  Surgeon: Elon HERO Pacini, MD;  Location: [facility];  Service: General;  Laterality: N/A;   LEFT HEART CATH AND CORONARY ANGIOGRAPHY N/A 08/27/2021   Procedure: LEFT HEART CATH AND CORONARY ANGIOGRAPHY;  Surgeon: Jordan, Peter M, MD;  Location: [facility] INVASIVE CV LAB;  Service: Cardiovascular;  Laterality:  N/A;   POLYPECTOMY  03/26/2020   Procedure: POLYPECTOMY;  Surgeon: Shaaron Lamar HERO, MD;  Location: [facility];  Service: Endoscopy;;   TONSILLECTOMY     as child   VENTRAL HERNIA REPAIR N/A 01/23/2013   Procedure: LAPAROSCOPIC MULTIPLE VENTRAL HERNIAS;  Surgeon: Elon HERO Pacini, MD;  Location: [facility];  Service: General;  Laterality: N/A;    Family History  Problem Relation Age of Onset   Depression Mother    Hearing loss Mother    Hyperlipidemia Mother    Hypertension Mother    Stroke Mother    Early death Father    Diabetes Brother    Pancreatic cancer Paternal Uncle    Colon cancer Neg Hx     Social History   Socioeconomic History   Marital status: Married    Spouse name: Not on file   Number of children: Not on file   Years of education: Not on file   Highest education level: Not on file  Occupational History   Not on file  Tobacco Use   Smoking status: Every Day    Current packs/day: 0.75    Average packs/day: 0.8 packs/day for 46.6 years (34.9 ttl pk-yrs)    Types: Cigarettes    Start date: 1980    Passive exposure: Current   Smokeless tobacco: Never   Tobacco comments:    1/2ppd  Vaping Use   Vaping status: Never Used  Substance and Sexual Activity   Alcohol use: Yes    Alcohol/week: 4.0 standard drinks of alcohol    Types: 4 Cans of beer per week    Comment: moderately   Drug use: Not Currently    Types: Cocaine    Comment: states he has tried it all. Last used cocaine a few years ago.SABRA   Sexual activity: Yes  Other Topics Concern   Not on file  Social History Narrative   Not on file   Social Drivers of Health   Tobacco Use: High Risk (06/19/2024)   Patient History    Smoking Tobacco Use: Every Day    Smokeless Tobacco Use: Never    Passive Exposure: Current  Financial Resource Strain: Low Risk (02/14/2024)   Overall Financial Resource Strain (CARDIA)    Difficulty of Paying Living Expenses: Not hard at all  Food Insecurity: No Food  Insecurity (02/14/2024)   Epic    Worried About Programme Researcher, Broadcasting/film/video in the Last Year: Never true    Ran Out of Food in the Last Year: Never true  Transportation Needs: No Transportation Needs (02/14/2024)   Epic    Lack of Transportation (Medical): No    Lack of Transportation (Non-Medical): No  Physical Activity: Sufficiently Active (02/14/2024)   Exercise Vital Sign    Days of Exercise per Week: 7 days    Minutes of Exercise per Session: 30 min  Stress: No Stress Concern Present (02/14/2024)   [employer] of Occupational Health - Occupational Stress Questionnaire    Feeling of Stress: Not at  all  Social Connections: Patient Declined (02/14/2024)   Social Connection and Isolation Panel    Frequency of Communication with Friends and Family: Patient declined    Frequency of Social Gatherings with Friends and Family: Patient declined    Attends Religious Services: Patient declined    Active Member of Clubs or Organizations: Patient declined    Attends Banker Meetings: Patient declined    Marital Status: Patient declined  Intimate Partner Violence: Not At Risk (02/14/2024)   Epic    Fear of Current or Ex-Partner: No    Emotionally Abused: No    Physically Abused: No    Sexually Abused: No  Depression (PHQ2-9): Low Risk (06/19/2024)   Depression (PHQ2-9)    PHQ-2 Score: 4  Alcohol Screen: Low Risk (02/14/2024)   Alcohol Screen    Last Alcohol Screening Score (AUDIT): 0  Housing: Low Risk (02/14/2024)   Epic    Unable to Pay for Housing in the Last Year: No    Number of Times Moved in the Last Year: 0    Homeless in the Last Year: No  Utilities: Not At Risk (02/14/2024)   Epic    Threatened with loss of utilities: No  Health Literacy: Adequate Health Literacy (02/14/2024)   B1300 Health Literacy    Frequency of need for help with medical instructions: Never    Outpatient Medications Prior to Visit  Medication Sig Dispense Refill   acetaminophen  (TYLENOL ) 650 MG CR  tablet Take 1,300 mg by mouth daily. (Patient taking differently: Take 1,300 mg by mouth as needed.)     albuterol  (VENTOLIN  HFA) 108 (90 Base) MCG/ACT inhaler Inhale 2 puffs into the lungs every 6 (six) hours as needed for wheezing or shortness of breath. 18 g 2   apixaban  (ELIQUIS ) 5 MG TABS tablet Take 1 tablet by mouth twice daily 180 tablet 1   cetirizine (ZYRTEC) 10 MG tablet Take 10 mg by mouth at bedtime.     esomeprazole  (NEXIUM ) 40 MG capsule Take 1 capsule by mouth once daily 90 capsule 0   fluticasone  (FLONASE ) 50 MCG/ACT nasal spray Place 2 sprays into both nostrils daily. 16 g 6   furosemide  (LASIX ) 20 MG tablet Take 1 tablet by mouth once daily 90 tablet 3   ibuprofen  (ADVIL ) 200 MG tablet Take 600 mg by mouth every 6 (six) hours as needed for mild pain (pain score 1-3) or moderate pain (pain score 4-6). (Patient taking differently: Take 600 mg by mouth as needed for mild pain (pain score 1-3) or moderate pain (pain score 4-6).)     loperamide  (IMODIUM ) 2 MG capsule Take 1 capsule (2 mg total) by mouth 4 (four) times daily as needed for diarrhea or loose stools. 12 capsule 0   losartan  (COZAAR ) 25 MG tablet Take 0.5 tablets (12.5 mg total) by mouth daily. 45 tablet 3   metoprolol  succinate (TOPROL -XL) 100 MG 24 hr tablet Take 1 tablet by mouth once daily 90 tablet 3   Multiple Vitamin (MULTIVITAMIN) tablet Take 1 tablet by mouth daily.     ondansetron  (ZOFRAN ) 4 MG tablet Take 1 tablet (4 mg total) by mouth every 6 (six) hours. 12 tablet 0   potassium chloride  (KLOR-CON  M) 10 MEQ tablet Take 1 tablet by mouth once daily 90 tablet 3   pravastatin  (PRAVACHOL ) 20 MG tablet TAKE 1 TABLET BY MOUTH THREE TIMES A WEEK 90 tablet 3   Probiotic Product (PROBIOTIC DAILY PO) Take 4 oz by mouth daily. Protein drink  sucralfate  (CARAFATE ) 1 g tablet Take 1 tablet (1 g total) by mouth 4 (four) times daily -  with meals and at bedtime. 60 tablet 0   vitamin B-12 (CYANOCOBALAMIN ) 1000 MCG tablet  Take 1,000 mcg by mouth daily.     Semaglutide , 1 MG/DOSE, (OZEMPIC , 1 MG/DOSE,) 2 MG/1.5ML SOPN Inject 1 mg into the skin every 7 (seven) days. Sunday or Monday     Facility-Administered Medications Prior to Visit  Medication Dose Route Frequency Provider Last Rate Last Admin   iron  sucrose (VENOFER ) 200 mg in sodium chloride  0.9 % 100 mL IVPB  200 mg Intravenous Once Pennington, Rebekah M, PA-C        Allergies  Allergen Reactions   Gabapentin Shortness Of Breath   Lyrica [Pregabalin] Shortness Of Breath   Crestor  [Rosuvastatin ] Other (See Comments)    myalgia   Farxiga  [Dapagliflozin ]     Developed UTI    ROS Review of Systems  Constitutional:  Negative for chills and fever.  HENT:  Positive for congestion. Negative for sore throat.   Eyes:  Negative for pain and discharge.  Respiratory:  Negative for cough and shortness of breath.   Cardiovascular:  Positive for palpitations (Intermittent). Negative for chest pain.  Gastrointestinal:  Negative for abdominal pain, nausea and vomiting.  Endocrine: Negative for polydipsia and polyuria.  Genitourinary:  Negative for dysuria and hematuria.  Musculoskeletal:  Positive for back pain. Negative for neck pain and neck stiffness.  Skin:  Negative for rash.  Neurological:  Negative for dizziness, weakness and numbness.  Psychiatric/Behavioral:  Negative for agitation and behavioral problems.       Objective:    Physical Exam Vitals reviewed.  Constitutional:      General: He is not in acute distress.    Appearance: He is not diaphoretic.  HENT:     Head: Normocephalic and atraumatic.     Nose: Congestion present.     Mouth/Throat:     Mouth: Mucous membranes are moist.  Eyes:     General: No scleral icterus.    Extraocular Movements: Extraocular movements intact.  Cardiovascular:     Rate and Rhythm: Normal rate and regular rhythm.     Heart sounds: Normal heart sounds. No murmur heard. Pulmonary:     Breath sounds:  Normal breath sounds. No wheezing or rales.  Musculoskeletal:     Cervical back: Neck supple. No tenderness.     Right lower leg: No edema.     Left lower leg: No edema.  Skin:    General: Skin is warm.     Findings: No rash.  Neurological:     General: No focal deficit present.     Mental Status: He is alert and oriented to person, place, and time.     Sensory: No sensory deficit.     Motor: No weakness.  Psychiatric:        Mood and Affect: Mood normal.        Behavior: Behavior normal.     BP 134/70 (BP Location: Right Arm)   Pulse 69   Ht 5' 9 (1.753 m)   Wt 193 lb (87.5 kg)   SpO2 100%   BMI 28.50 kg/m  Wt Readings from Last 3 Encounters:  06/19/24 193 lb (87.5 kg)  04/29/24 197 lb (89.4 kg)  04/24/24 192 lb 14.4 oz (87.5 kg)    Lab Results  Component Value Date   TSH 1.910 03/08/2022   Lab Results  Component Value Date  WBC 8.0 04/24/2024   HGB 15.5 04/24/2024   HCT 46.9 04/24/2024   MCV 100.2 (H) 04/24/2024   PLT 152 04/24/2024   Lab Results  Component Value Date   NA 136 04/24/2024   K 4.2 04/24/2024   CO2 22 04/24/2024   GLUCOSE 109 (H) 04/24/2024   BUN 23 (H) 04/24/2024   CREATININE 0.82 04/24/2024   BILITOT 0.4 04/24/2024   ALKPHOS 102 04/24/2024   AST 23 04/24/2024   ALT 19 04/24/2024   PROT 7.7 04/24/2024   ALBUMIN 4.3 04/24/2024   CALCIUM  9.6 04/24/2024   ANIONGAP 13 04/24/2024   EGFR 103 09/20/2023   Lab Results  Component Value Date   CHOL 159 05/11/2023   Lab Results  Component Value Date   HDL 37 (L) 05/11/2023   Lab Results  Component Value Date   LDLCALC 96 05/11/2023   Lab Results  Component Value Date   TRIG 146 05/11/2023   Lab Results  Component Value Date   CHOLHDL 4.3 05/11/2023   Lab Results  Component Value Date   HGBA1C 5.5 01/29/2024      Assessment & Plan:   Problem List Items Addressed This Visit       Cardiovascular and Mediastinum   Essential hypertension - Primary   BP Readings from  Last 1 Encounters:  06/19/24 134/70   Well-controlled with Losartan  12.5 mg QD and Metoprolol  100 mg QD now Counseled for compliance with the medications Advised DASH diet and moderate exercise/walking, at least 150 mins/week      Relevant Orders   TSH   CMP14+EGFR   CBC with Differential/Platelet   Paroxysmal atrial fibrillation (HCC)   Rate controlled with metoprolol  now In sinus rhythm now On Eliquis  for [facility] S/p cardioversion (02/25) and cardiac ablation (05/25) Followed by EP cardiology      Relevant Orders   TSH   CMP14+EGFR   CBC with Differential/Platelet     Respiratory   Chronic obstructive pulmonary disease (HCC)   Well-controlled Uses albuterol  as needed for dyspnea or wheezing, does not need it daily If frequent use needed, will add maintenance inhaler for COPD        Endocrine   Type 2 diabetes mellitus with other specified complication (HCC)   Lab Results  Component Value Date   HGBA1C 5.5 01/29/2024   Well-controlled Associated with HTN, HLD and obesity On Ozempic  - at 1 mg qw dose through patient assistance, but he is ineligible for program now Discussed with the patient, sent prescription of Ozempic  at local pharmacy If Ozempic  is not cost effective, may need to try Trulicity through patient assistance program Advised to follow diabetic diet F/u CMP and lipid panel On statin and ARB now Diabetic eye exam: Advised to follow up with Ophthalmology for diabetic eye exam      Relevant Medications   Semaglutide , 1 MG/DOSE, 4 MG/3ML SOPN   Other Relevant Orders   Hemoglobin A1c   CMP14+EGFR   Urine Microalbumin w/creat. ratio     Other   Prostate cancer screening   Ordered PSA after discussing its limitations for prostate cancer screening, including false positive results leading to additional investigations.      Relevant Orders   PSA   Hyperlipidemia   On Pravastatin  QOD now Did not tolerate Crestor       Relevant Orders   Lipid panel    Encounter for general adult medical examination with abnormal findings   Physical exam as documented. Fasting blood tests ordered. PCV20  vaccine today.      Other Visit Diagnoses       Vitamin D  deficiency       Relevant Orders   VITAMIN D  25 Hydroxy (Vit-D Deficiency, Fractures)     Encounter for immunization       Relevant Orders   Pneumococcal conjugate vaccine 20-valent (Completed)       Meds ordered this encounter  Medications   Semaglutide , 1 MG/DOSE, 4 MG/3ML SOPN    Sig: Inject 1 mg as directed once a week.    Dispense:  3 mL    Refill:  5    Follow-up: Return in about 4 months (around 10/17/2024) for DM and COPD.    Suzzane MARLA Blanch, MD "

## 2024-06-19 NOTE — Patient Instructions (Addendum)
 Please submit application for [employer].  Please continue to take medications as prescribed.  Please continue to follow low carb diet and perform moderate exercise/walking at least 150 mins/week.

## 2024-06-19 NOTE — Assessment & Plan Note (Addendum)
 BP Readings from Last 1 Encounters:  06/19/24 134/70   Well-controlled with Losartan  12.5 mg QD and Metoprolol  100 mg QD now Counseled for compliance with the medications Advised DASH diet and moderate exercise/walking, at least 150 mins/week

## 2024-06-19 NOTE — Assessment & Plan Note (Signed)
 Well-controlled Uses albuterol as needed for dyspnea or wheezing, does not need it daily If frequent use needed, will add maintenance inhaler for COPD

## 2024-06-19 NOTE — Assessment & Plan Note (Signed)
 Rate controlled with metoprolol  now In sinus rhythm now On Eliquis  for [facility] S/p cardioversion (02/25) and cardiac ablation (05/25) Followed by EP cardiology

## 2024-06-19 NOTE — Assessment & Plan Note (Signed)
Physical exam as documented. Fasting blood tests ordered. PCV20 vaccine today. 

## 2024-06-19 NOTE — Assessment & Plan Note (Signed)
 On Pravastatin QOD now Did not tolerate Crestor

## 2024-06-19 NOTE — Assessment & Plan Note (Addendum)
 Lab Results  Component Value Date   HGBA1C 5.5 01/29/2024   Well-controlled Associated with HTN, HLD and obesity On Ozempic  - at 1 mg qw dose through patient assistance, but he is ineligible for program now Discussed with the patient, sent prescription of Ozempic  at local pharmacy If Ozempic  is not cost effective, may need to try Trulicity through patient assistance program Advised to follow diabetic diet F/u CMP and lipid panel On statin and ARB now Diabetic eye exam: Advised to follow up with Ophthalmology for diabetic eye exam

## 2024-06-19 NOTE — Assessment & Plan Note (Signed)
 Ordered PSA after discussing its limitations for prostate cancer screening, including false positive results leading to additional investigations.

## 2024-06-20 ENCOUNTER — Telehealth: Payer: Self-pay

## 2024-06-20 ENCOUNTER — Ambulatory Visit: Payer: Self-pay | Admitting: Internal Medicine

## 2024-06-20 LAB — CBC WITH DIFFERENTIAL/PLATELET
Basophils Absolute: 0 10*3/uL (ref 0.0–0.2)
Basos: 0 %
EOS (ABSOLUTE): 0.3 10*3/uL (ref 0.0–0.4)
Eos: 4 %
Hematocrit: 45.1 % (ref 37.5–51.0)
Hemoglobin: 15.2 g/dL (ref 13.0–17.7)
Immature Grans (Abs): 0 10*3/uL (ref 0.0–0.1)
Immature Granulocytes: 0 %
Lymphocytes Absolute: 1.8 10*3/uL (ref 0.7–3.1)
Lymphs: 26 %
MCH: 33.7 pg — ABNORMAL HIGH (ref 26.6–33.0)
MCHC: 33.7 g/dL (ref 31.5–35.7)
MCV: 100 fL — ABNORMAL HIGH (ref 79–97)
Monocytes Absolute: 0.8 10*3/uL (ref 0.1–0.9)
Monocytes: 11 %
Neutrophils Absolute: 4 10*3/uL (ref 1.4–7.0)
Neutrophils: 59 %
Platelets: 153 10*3/uL (ref 150–450)
RBC: 4.51 x10E6/uL (ref 4.14–5.80)
RDW: 12.6 % (ref 11.6–15.4)
WBC: 6.9 10*3/uL (ref 3.4–10.8)

## 2024-06-20 LAB — CMP14+EGFR
ALT: 20 [IU]/L (ref 0–44)
AST: 25 [IU]/L (ref 0–40)
Albumin: 4.6 g/dL (ref 3.8–4.9)
Alkaline Phosphatase: 125 [IU]/L — ABNORMAL HIGH (ref 47–123)
BUN/Creatinine Ratio: 24 (ref 10–24)
BUN: 20 mg/dL (ref 8–27)
Bilirubin Total: 0.4 mg/dL (ref 0.0–1.2)
CO2: 21 mmol/L (ref 20–29)
Calcium: 9.7 mg/dL (ref 8.6–10.2)
Chloride: 102 mmol/L (ref 96–106)
Creatinine, Ser: 0.82 mg/dL (ref 0.76–1.27)
Globulin, Total: 2.8 g/dL (ref 1.5–4.5)
Glucose: 86 mg/dL (ref 70–99)
Potassium: 4.3 mmol/L (ref 3.5–5.2)
Sodium: 138 mmol/L (ref 134–144)
Total Protein: 7.4 g/dL (ref 6.0–8.5)
eGFR: 101 mL/min/{1.73_m2}

## 2024-06-20 LAB — HEMOGLOBIN A1C
Est. average glucose Bld gHb Est-mCnc: 123 mg/dL
Hgb A1c MFr Bld: 5.9 % — ABNORMAL HIGH (ref 4.8–5.6)

## 2024-06-20 LAB — PSA: Prostate Specific Ag, Serum: 0.9 ng/mL (ref 0.0–4.0)

## 2024-06-20 LAB — LIPID PANEL
Chol/HDL Ratio: 3.3 ratio (ref 0.0–5.0)
Cholesterol, Total: 149 mg/dL (ref 100–199)
HDL: 45 mg/dL
LDL Chol Calc (NIH): 88 mg/dL (ref 0–99)
Triglycerides: 86 mg/dL (ref 0–149)
VLDL Cholesterol Cal: 16 mg/dL (ref 5–40)

## 2024-06-20 LAB — VITAMIN D 25 HYDROXY (VIT D DEFICIENCY, FRACTURES): Vit D, 25-Hydroxy: 46.2 ng/mL (ref 30.0–100.0)

## 2024-06-20 LAB — TSH: TSH: 1.73 u[IU]/mL (ref 0.450–4.500)

## 2024-06-20 NOTE — Telephone Encounter (Signed)
 Called pt to let him know  that the blood tests are overall within normal limits. HbA1c is 5.9-well-controlled.

## 2024-07-12 ENCOUNTER — Ambulatory Visit: Admitting: Physician Assistant

## 2024-07-22 ENCOUNTER — Ambulatory Visit: Admitting: Cardiology

## 2024-10-16 ENCOUNTER — Ambulatory Visit: Payer: Self-pay | Admitting: Internal Medicine

## 2025-02-18 ENCOUNTER — Ambulatory Visit
